# Patient Record
Sex: Female | Born: 1950 | Race: White | Hispanic: No | Marital: Married | Smoking: Former smoker
Health system: Southern US, Community
[De-identification: ages and names within clinical notes are randomized; demographics above are authoritative.]

## PROBLEM LIST (undated history)

## (undated) DIAGNOSIS — E119 Type 2 diabetes mellitus without complications: Secondary | ICD-10-CM

## (undated) DIAGNOSIS — F32A Depression, unspecified: Secondary | ICD-10-CM

## (undated) DIAGNOSIS — F329 Major depressive disorder, single episode, unspecified: Secondary | ICD-10-CM

## (undated) DIAGNOSIS — G709 Myoneural disorder, unspecified: Secondary | ICD-10-CM

## (undated) DIAGNOSIS — I10 Essential (primary) hypertension: Secondary | ICD-10-CM

## (undated) DIAGNOSIS — I341 Nonrheumatic mitral (valve) prolapse: Secondary | ICD-10-CM

## (undated) DIAGNOSIS — F419 Anxiety disorder, unspecified: Secondary | ICD-10-CM

## (undated) DIAGNOSIS — E079 Disorder of thyroid, unspecified: Secondary | ICD-10-CM

## (undated) DIAGNOSIS — T753XXA Motion sickness, initial encounter: Secondary | ICD-10-CM

## (undated) DIAGNOSIS — R519 Headache, unspecified: Secondary | ICD-10-CM

## (undated) DIAGNOSIS — R51 Headache: Secondary | ICD-10-CM

## (undated) DIAGNOSIS — I4891 Unspecified atrial fibrillation: Secondary | ICD-10-CM

## (undated) DIAGNOSIS — Z9641 Presence of insulin pump (external) (internal): Secondary | ICD-10-CM

## (undated) HISTORY — DX: Essential (primary) hypertension: I10

## (undated) HISTORY — DX: Major depressive disorder, single episode, unspecified: F32.9

## (undated) HISTORY — DX: Unspecified atrial fibrillation: I48.91

## (undated) HISTORY — DX: Type 2 diabetes mellitus without complications: E11.9

## (undated) HISTORY — DX: Disorder of thyroid, unspecified: E07.9

## (undated) HISTORY — DX: Anxiety disorder, unspecified: F41.9

## (undated) HISTORY — DX: Nonrheumatic mitral (valve) prolapse: I34.1

## (undated) HISTORY — DX: Depression, unspecified: F32.A

---

## 2002-01-21 HISTORY — PX: BACK SURGERY: SHX140

## 2002-10-05 ENCOUNTER — Encounter: Payer: Self-pay | Admitting: Neurosurgery

## 2002-10-07 ENCOUNTER — Ambulatory Visit (HOSPITAL_COMMUNITY): Admission: RE | Admit: 2002-10-07 | Discharge: 2002-10-08 | Payer: Self-pay | Admitting: Neurosurgery

## 2002-10-07 ENCOUNTER — Encounter: Payer: Self-pay | Admitting: Neurosurgery

## 2002-10-17 ENCOUNTER — Inpatient Hospital Stay (HOSPITAL_COMMUNITY): Admission: EM | Admit: 2002-10-17 | Discharge: 2002-10-20 | Payer: Self-pay | Admitting: Neurosurgery

## 2002-10-18 ENCOUNTER — Encounter: Payer: Self-pay | Admitting: Neurosurgery

## 2002-10-19 ENCOUNTER — Encounter: Payer: Self-pay | Admitting: Neurosurgery

## 2002-11-29 ENCOUNTER — Inpatient Hospital Stay (HOSPITAL_COMMUNITY): Admission: RE | Admit: 2002-11-29 | Discharge: 2002-12-02 | Payer: Self-pay | Admitting: Neurosurgery

## 2002-12-23 ENCOUNTER — Encounter: Admission: RE | Admit: 2002-12-23 | Discharge: 2002-12-23 | Payer: Self-pay | Admitting: Neurosurgery

## 2003-03-01 ENCOUNTER — Encounter: Admission: RE | Admit: 2003-03-01 | Discharge: 2003-03-01 | Payer: Self-pay | Admitting: Neurosurgery

## 2003-12-27 ENCOUNTER — Ambulatory Visit: Payer: Self-pay

## 2004-02-21 ENCOUNTER — Encounter: Admission: RE | Admit: 2004-02-21 | Discharge: 2004-02-21 | Payer: Self-pay | Admitting: Neurosurgery

## 2004-12-27 ENCOUNTER — Ambulatory Visit: Payer: Self-pay | Admitting: Unknown Physician Specialty

## 2005-05-17 ENCOUNTER — Ambulatory Visit: Payer: Self-pay | Admitting: Neurosurgery

## 2007-04-05 ENCOUNTER — Encounter
Admission: RE | Admit: 2007-04-05 | Discharge: 2007-04-05 | Payer: Self-pay | Admitting: Physical Medicine and Rehabilitation

## 2007-07-30 ENCOUNTER — Ambulatory Visit: Payer: Self-pay | Admitting: Unknown Physician Specialty

## 2007-11-20 ENCOUNTER — Emergency Department: Payer: Self-pay | Admitting: Emergency Medicine

## 2007-11-22 ENCOUNTER — Ambulatory Visit: Payer: Self-pay | Admitting: Family Medicine

## 2008-09-09 ENCOUNTER — Ambulatory Visit: Payer: Self-pay | Admitting: Unknown Physician Specialty

## 2008-11-14 ENCOUNTER — Ambulatory Visit: Payer: Self-pay | Admitting: Unknown Physician Specialty

## 2009-04-05 ENCOUNTER — Ambulatory Visit: Payer: Self-pay | Admitting: Unknown Physician Specialty

## 2009-08-11 ENCOUNTER — Ambulatory Visit: Payer: Self-pay | Admitting: Unknown Physician Specialty

## 2010-06-08 NOTE — Op Note (Signed)
NAME:  Colleen Payne, Colleen Payne                        ACCOUNT NO.:  [id number]   MEDICAL RECORD NO.:  [id number]                   PATIENT TYPE:  INP   LOCATION:  2899                                 FACILITY:  [facility]   PHYSICIAN:  Kathaleen Maser. Pool, M.D.                 DATE OF BIRTH:  10-09-1950   DATE OF PROCEDURE:  11/29/2002  DATE OF DISCHARGE:                                 OPERATIVE REPORT   PREOPERATIVE DIAGNOSES:   POSTOPERATIVE DIAGNOSES:   OPERATION PERFORMED:   SURGEON:  Henry A. Pool, M.D.   ASSISTANT:   ANESTHESIA:  General endotracheal.   INDICATIONS FOR PROCEDURE:  Colleen Payne is a 60 year old female who is status  post previous left-sided L3-4 laminotomy and microdiskectomy and subsequent  left-sided L4-5 decompressive laminotomy.  She presents with continued back  and left lower extremity pain failing all conservative management.  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  The patient  presents now for two-level decompression and fusion with instrumentation for  hopeful improvement of the symptoms.   DESCRIPTION OF PROCEDURE:  The patient was taken to the operating room and  placed on the table in the supine position.  After adequate level of  anesthesia was achieved, the patient was positioned prone onto a Wilson  frame and appropriately padded.  The patient's lumbar region was prepped and  draped sterilely.  A 10 blade was used to make a linear skin incision  overlying the L3,4, and 5 levels.  This was carried down sharply in the  midline.  A subperiosteal dissection was then performed exposing the lamina  and facet joints of L3 and L4 and L5 bilateral as well as the transverse  processes of L3, 4 and 5 bilaterally.  Deep self-retaining retractor was  placed.  Intraoperative fluoroscopy was used, the levels were confirmed.  Previous laminotomies on the left side at L3-4  and L4-5 were dissected free  using dental instruments.  Complete laminectomies were then performed using  Leksell rongeurs, Kerrison rongeurs and high speed drill to remove the  entire lamina of L3, the entire lamina of L4 and the superior aspect of the  lamina of L5.  Complete inferior facetectomies at L3 and L4 were performed  bilaterally.  Complete superior facetectomies at L4 and L5 were performed  bilaterally.  All bone was cleaned and used in later autografting.  Epidural  scar and ligamentum flavum was then elevated and resected in piecemeal  fashion using Kerrison rongeurs.  The underlying thecal sac and exiting L3,  L4 and L5 nerve roots were widely decompressed.  There was no evidence of  residual foraminal stenosis.  Starting first at L3-4, the epidural venous  plexus was coagulated and cut.  Starting first on the left side, thecal sac  and nerve roots were gradually mobilized and retracted toward  the midline.  The disk space was then isolated and incised with a 15 blade in rectangular  fashion.  A wide disk space cleanout was then achieved using pituitary  rongeurs, upward angled pituitary rongeurs and Epstein curets.  The disk  space was then distracted up to 8 mm and attention was placed to the right  side.  The thecal sac and nerve roots were then protected on the right.  The  disk space was incised and aggressive diskectomy was performed on the right  side.  Then using 8 mm tangent instruments, the disk space was then cut with  a box cutter and then cut with a tangent chisel.  Soft tissue was removed  from the interspace.  An 8 x 26 mm tangent wedge was then packed into place  and recessed approximately 2 mm from the posterior cortical margin.  Distractor was removed to the patient's left side.  The thecal sac and nerve  roots were protected on the left.  The disk space was then reamed, then cut  with an 8 mm chisel.  The disk space was further curettaged.  Morselized   autograft was then packed within the interspace. A second 8 x 26 mm tangent  wedge was then impacted into place and recessed approximately 2 mm from the  posterior cortical margin.  The procedure was then repeated at L4-5 again  without complication.  We were able to completely reduce the patient's  deformity at L4-5.  Pedicles at L3, L4 and L5 were then isolated using  surface landmarks and intraoperative fluoroscopy.  Superficial bone over the  pedicle was removed using high speed drill.  Each pedicle was then probed  using pedicle awl.  Each pedicle awl track was then probed and found to be  solidly within bone.  Each pedicle awl track was then tapped with a 5.25  screw tap and once again probed and found to be solidly within bone.  6.75 x  40 mm spiral 90 screws were placed bilaterally at L3, L4 and L5.  All six  screws had good purchase and were well directed.  Transverse processes of  L3, 4 and 5 were then decorticated using a high speed drill.  Morselized  autograft was packed posterolaterally for later use in fusion.  Titanium rod  was then contoured and placed over the screw heads at L3, 4 and 5.  A  locking cap was then placed over the screw heads.  The locking cap was then  engaged with construct under compression.  Final images revealed good  position of bone graft and hardware at the proper operative level with  normal alignment of the spine.  A transverse connector was placed.  The  wound was irrigated one final time.  Tisseel was placed over the thecal sac  as the patient had a previous CSF leak with her primary operation; however,  there was no evidence of any CSF leak with this case. The wound was then  closed in layers with Vicryl sutures.  Steri-Strips and sterile dressing  were applied.  There were no apparent complications.  The patient tolerated  the procedure well and she returned to the recovery room postoperatively.                                                Henry A. Pool, M.D.  HAP/MEDQ  D:  11/29/2002  T:  11/29/2002  Job:  [id number]

## 2010-06-08 NOTE — Op Note (Signed)
NAME:  Colleen Payne, Colleen Payne                        ACCOUNT NO.:  [id number]   MEDICAL RECORD NO.:  [id number]                   PATIENT TYPE:  INP   LOCATION:  3005                                 FACILITY:  [facility]   PHYSICIAN:  Kathaleen Maser. Pool, M.D.                 DATE OF BIRTH:  04/09/1950   DATE OF PROCEDURE:  10/19/2002  DATE OF DISCHARGE:                                 OPERATIVE REPORT   SERVICE:  Neurosurgery.   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   PROCEDURE:  Left L4-5 decompressive laminotomy with foraminotomy.   SURGEON:  Kathaleen Maser. Pool, M.D.   ASSISTANT:  Tia Alert, MD   ANESTHESIA:  General endotracheal.   INDICATIONS FOR PROCEDURE:  [name] is a 60 year old female who is  approximately 1 1/2 weeks status post left sided L3-4 laminotomy and  microdiskectomy for treatment of a symptomatic lumbar disk herniation. The  patient had known advanced degenerative disk disease of both L3-4 and L4-5.  At L4-5, she had evidence of a chronic grade 1 degenerative  spondylolisthesis with stenosis. In fact, the original recommendation to the  patient was that she undergo an L3-4 and L4-5 decompression and fusion with  instrumentation for a more complete solution to her problem. The patient had  an isolated left sided L4 radiculopathy preoperatively and she very much  wished to avoid the fusion. For that reason, we decided to proceed with a  left sided L3-4 laminotomy and microdiskectomy. Postoperatively, the patient  did well in that her left lower extremity radicular pain resolved. However,  two days postoperatively she began having pronounced left sided L5 radicular  pain, paresthesias and weakness. She has a significant foot drop with  dorsiflexion strength reduced to 3/5. She has failed conservative management  with antiinflammatory medications including oral and injected  steroids. We  had a long discussion as to what to do. We reimaged her spine and the  surgical site at L3-4 looks quite good. There is no evidence of residual  stenosis or nerve root compression. At L4-5, she had some significant left  lateral recess stenosis with compression to the left sided L5 nerve root. We  discussed possible options for management. Given the degree of weakness that  she has, we decided to proceed with a left sided L4-5 laminotomy and  foraminotomy for hopeful improvement in her symptoms.   DESCRIPTION OF PROCEDURE:  The patient was taken to the operating room,  placed on the table in a supine position. After an adequate level of  anesthesia was achieved, the patient was positioned prone onto a Wilson  frame, appropriately padded. The patient's lumbar region, prepped and draped  sterilely. A 10 blade was used to make a linear skin incision overlying the  L4-5 interspace. This was carried down sharply in the midline. Subperiosteal  dissection  was performed exposing the L4-5 lamina and facet joint complex. X-  rays taken, level was confirmed.  A laminotomy was then performed using a  high speed drill and Kerrison rongeurs to remove the inferior aspect of the  lamina of L4, the medial aspect of the L4-5 facet joint and superior rim of  the L5 lamina. The ligamentum flavum was then elevated and resected in a  piecemeal fashion using Kerrison rongeurs and carried down to the thecal sac  and the exiting L5 nerve root was identified. The microscope was brought  onto the field and used for microdissection of the left sided L5 nerve root.  Wide foraminotomies were performed along the course of the exiting L5 nerve  root. At this point, there was no evidence of residual compression. There  was no evidence of injury to the thecal sac or nerve roots. The wound was  then copiously irrigated with antibiotic solution. Gelfoam was placed  topically. Hemostasis was found to be good.  The microscope and retraction  system were removed. Hemostasis was achieved with electrocautery. The wound  was closed in layers with Vicryl sutures. Steri-Strips and a sterile  dressing were applied. There were no apparent complications. The patient  tolerated the procedure well and she returned to the recovery room postop.                                               Henry A. Pool, M.D.    HAP/MEDQ  D:  10/19/2002  T:  10/20/2002  Job:  [id number]

## 2010-06-08 NOTE — Op Note (Signed)
NAME:  Colleen Payne, Colleen Payne                        ACCOUNT NO.:  [id number]   MEDICAL RECORD NO.:  [id number]                   PATIENT TYPE:  OIB   LOCATION:  3014                                 FACILITY:  [facility]   PHYSICIAN:  Kathaleen Maser. Pool, M.D.                 DATE OF BIRTH:  [birth year]   DATE OF PROCEDURE:  10/07/2002  DATE OF DISCHARGE:                                 OPERATIVE REPORT   PREOPERATIVE DIAGNOSES:  Left L3-4 herniated nucleus pulposus with  radiculopathy.   POSTOPERATIVE DIAGNOSES:  Left L3-4 herniated nucleus pulposus with  radiculopathy.   OPERATION PERFORMED:  Left L3-4 laminotomy and microdiskectomy.   SURGEON:  Kathaleen Maser. Pool, M.D.   ASSISTANT:  Tia Alert, MD   ANESTHESIA:  General endotracheal.   INDICATIONS FOR PROCEDURE:  [name] is a 60 year old female with a  history of severe back and left lower extremity pain, paresthesias and  weakness consistent with a left-sided L4 radiculopathy which has failed  conservative management.  MRI scan demonstrates a large leftward L3-4 disk  herniation with marked compression of the thecal sac and left L4 nerve root.  The patient has been counseled as to her options.  She has decided to  proceed with a left-sided L3-4 laminotomy and microdiskectomy for hopeful  improvement of the symptoms.   DESCRIPTION OF PROCEDURE:  The patient was taken to the operating room and  placed on the table in the supine position.  After adequate level of  anesthesia was achieved, the patient was positioned prone onto a Wilson  frame and appropriately padded.  The patient's lumbar region was prepped and  draped sterilely.  A 10 blade was used to make a linear skin incision  overlying the L3-4 interspace.  This was carried down sharply in the  midline.  A subperiosteal dissection was then performed exposing the lamina  and facet joints of L3 and L4 on the left.  Deep self-retaining retractor  was placed.  Intraoperative x-ray was  taken, the level was confirmed.  The  laminotomy was then performed using high speed drill and Kerrison rongeurs  to remove the inferior edge of the lamina at L3, the medial aspect of the L3-  4 facet joint and the superior rim of the L4 lamina.  The ligamentum flavum  was then elevated and resected in a piecemeal fashion using Kerrison  rongeurs.  The underlying thecal sac was identified.  The thecal sac and L4  nerve root were gently mobilized.   The microscope was brought into the field and used for microdissection of  the left-sided L4 nerve root and disk herniation.  The epidural venous  plexus was coagulated and cut.  A large amount of free disk herniation was  encountered and completely resected using a blunt nerve hook and pituitary  rongeurs.  The disk space was isolated and incised with a 15 blade in  rectangular fashion.  A wide disk space cleanout was then achieved pituitary  rongeurs and upward angled pituitary rongeurs and Epstein curets.  All loose  or obviously degenerative disk material was removed from the interspace.  The disk herniation was fused with the ventral surface of the dura.  This  was dissected free and the disk herniation was completely resected; however,  a small unintended durotomy was made ventrally.  This was unable to be  sutured closed.  The wound was then irrigated with antibiotic solution and  then Duragen collagen sponge was then placed between the disk and the  durotomy.  This formed good closure.  The lateral gutter was also packed  with  Duragen.  Tisseal fibrin glue sealant was then placed over the  laminotomy defect as was Gelfoam.  There was no evidence of CSF leak at this  point.  The wound was then irrigated one final time and then closed in  typical fashion.  Steri-Strips and sterile dressing were applied.  There  were no apparent complications.  The patient tolerated the procedure well  and returned to the recovery room  postoperatively.                                                Henry A. Pool, M.D.    HAP/MEDQ  D:  10/07/2002  T:  10/07/2002  Job:  [id number]

## 2010-06-08 NOTE — Discharge Summary (Signed)
NAME:  Colleen Payne, Colleen Payne                        ACCOUNT NO.:  [id number]   MEDICAL RECORD NO.:  [id number]                   PATIENT TYPE:  INP   LOCATION:  3006                                 FACILITY:  [facility]   PHYSICIAN:  Kathaleen Maser. Pool, M.D.                 DATE OF BIRTH:  [birth year]   DATE OF ADMISSION:  11/29/2002  DATE OF DISCHARGE:  12/02/2002                                 DISCHARGE SUMMARY   FINAL DIAGNOSES:  1. L3-4 and L4-5 degenerative disk disease.  2. L4-5 grade 1 degenerative spondylolisthesis.   OPERATIONS/TREATMENTS:  L3-4 and L4-5 posterior lumbar decompression and  fusion with instrumentation.   HISTORY OF PRESENT ILLNESS:  Ms. 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  Workup demonstrated evidence of multi-degenerative  disk disease with stenosis at L3-4 and a grade 1 degenerative  spondylolisthesis at L4-5 with stenosis.  The patient has failed all  conservative management.  She has failed simple decompressive surgery in the  past and presents now for decompression and fusion surgery.   HOSPITAL COURSE:  The patient was taken to the operating room where an  uncomplicated L3-4 and L4-5 decompressive laminectomy and posterior lumbar  interbody fusion was performed at L3-4 and L4-5 coupled with posterior rod  and fusion from L3-5 using a segmental pedicle screw fixation.  Postoperatively, the patient did well with good improvement of her left  lower extremity pain.  Neurologically, she is intact.  The wound is healing  well.  At the time of discharge, the patient was ambulatory without  assistance.  The wound is healing well.  The patient's pain was much  improved and easily controlled with oral medications.   CONDITION ON DISCHARGE:  Improved.   FOLLOW UP:  Discharge followup is in one week in my office.      Henry A. Pool, M.D.    HAP/MEDQ  D:  01/20/2003  T:  01/20/2003  Job:  [id number]

## 2010-07-31 ENCOUNTER — Emergency Department: Payer: Self-pay | Admitting: Internal Medicine

## 2010-11-03 ENCOUNTER — Emergency Department: Payer: Self-pay | Admitting: Emergency Medicine

## 2011-01-31 DIAGNOSIS — E039 Hypothyroidism, unspecified: Secondary | ICD-10-CM | POA: Diagnosis not present

## 2011-01-31 DIAGNOSIS — E119 Type 2 diabetes mellitus without complications: Secondary | ICD-10-CM | POA: Diagnosis not present

## 2011-02-05 DIAGNOSIS — H109 Unspecified conjunctivitis: Secondary | ICD-10-CM | POA: Diagnosis not present

## 2011-02-05 DIAGNOSIS — E119 Type 2 diabetes mellitus without complications: Secondary | ICD-10-CM | POA: Diagnosis not present

## 2011-02-05 DIAGNOSIS — I4891 Unspecified atrial fibrillation: Secondary | ICD-10-CM | POA: Diagnosis not present

## 2011-02-06 DIAGNOSIS — IMO0002 Reserved for concepts with insufficient information to code with codable children: Secondary | ICD-10-CM | POA: Diagnosis not present

## 2011-02-06 DIAGNOSIS — M48061 Spinal stenosis, lumbar region without neurogenic claudication: Secondary | ICD-10-CM | POA: Diagnosis not present

## 2011-02-08 DIAGNOSIS — N951 Menopausal and female climacteric states: Secondary | ICD-10-CM | POA: Diagnosis not present

## 2011-03-05 DIAGNOSIS — I4891 Unspecified atrial fibrillation: Secondary | ICD-10-CM | POA: Diagnosis not present

## 2011-03-07 DIAGNOSIS — I80299 Phlebitis and thrombophlebitis of other deep vessels of unspecified lower extremity: Secondary | ICD-10-CM | POA: Diagnosis not present

## 2011-03-13 DIAGNOSIS — I471 Supraventricular tachycardia: Secondary | ICD-10-CM | POA: Diagnosis not present

## 2011-03-21 DIAGNOSIS — I4891 Unspecified atrial fibrillation: Secondary | ICD-10-CM | POA: Diagnosis not present

## 2011-03-26 DIAGNOSIS — Z981 Arthrodesis status: Secondary | ICD-10-CM | POA: Diagnosis not present

## 2011-03-26 DIAGNOSIS — M47817 Spondylosis without myelopathy or radiculopathy, lumbosacral region: Secondary | ICD-10-CM | POA: Diagnosis not present

## 2011-03-26 DIAGNOSIS — M5126 Other intervertebral disc displacement, lumbar region: Secondary | ICD-10-CM | POA: Diagnosis not present

## 2011-03-28 DIAGNOSIS — I4891 Unspecified atrial fibrillation: Secondary | ICD-10-CM | POA: Diagnosis not present

## 2011-04-02 DIAGNOSIS — I4891 Unspecified atrial fibrillation: Secondary | ICD-10-CM | POA: Diagnosis not present

## 2011-04-09 DIAGNOSIS — I4891 Unspecified atrial fibrillation: Secondary | ICD-10-CM | POA: Diagnosis not present

## 2011-04-10 DIAGNOSIS — M48061 Spinal stenosis, lumbar region without neurogenic claudication: Secondary | ICD-10-CM | POA: Diagnosis not present

## 2011-04-17 DIAGNOSIS — I4891 Unspecified atrial fibrillation: Secondary | ICD-10-CM | POA: Diagnosis not present

## 2011-05-01 DIAGNOSIS — M5410 Radiculopathy, site unspecified: Secondary | ICD-10-CM | POA: Insufficient documentation

## 2011-05-01 DIAGNOSIS — IMO0002 Reserved for concepts with insufficient information to code with codable children: Secondary | ICD-10-CM | POA: Diagnosis not present

## 2011-05-02 DIAGNOSIS — I4891 Unspecified atrial fibrillation: Secondary | ICD-10-CM | POA: Diagnosis not present

## 2011-05-02 DIAGNOSIS — E109 Type 1 diabetes mellitus without complications: Secondary | ICD-10-CM | POA: Diagnosis not present

## 2011-05-06 DIAGNOSIS — E119 Type 2 diabetes mellitus without complications: Secondary | ICD-10-CM | POA: Diagnosis not present

## 2011-05-06 DIAGNOSIS — M549 Dorsalgia, unspecified: Secondary | ICD-10-CM | POA: Diagnosis not present

## 2011-05-08 DIAGNOSIS — IMO0002 Reserved for concepts with insufficient information to code with codable children: Secondary | ICD-10-CM | POA: Diagnosis not present

## 2011-05-20 DIAGNOSIS — I4891 Unspecified atrial fibrillation: Secondary | ICD-10-CM | POA: Diagnosis not present

## 2011-05-28 DIAGNOSIS — E119 Type 2 diabetes mellitus without complications: Secondary | ICD-10-CM | POA: Diagnosis not present

## 2011-05-28 DIAGNOSIS — I4891 Unspecified atrial fibrillation: Secondary | ICD-10-CM | POA: Diagnosis not present

## 2011-05-28 DIAGNOSIS — I10 Essential (primary) hypertension: Secondary | ICD-10-CM | POA: Diagnosis not present

## 2011-06-12 DIAGNOSIS — E1165 Type 2 diabetes mellitus with hyperglycemia: Secondary | ICD-10-CM | POA: Diagnosis not present

## 2011-06-12 DIAGNOSIS — IMO0002 Reserved for concepts with insufficient information to code with codable children: Secondary | ICD-10-CM | POA: Diagnosis not present

## 2011-06-12 DIAGNOSIS — H43399 Other vitreous opacities, unspecified eye: Secondary | ICD-10-CM | POA: Diagnosis not present

## 2011-06-12 DIAGNOSIS — E1139 Type 2 diabetes mellitus with other diabetic ophthalmic complication: Secondary | ICD-10-CM | POA: Diagnosis not present

## 2011-06-27 DIAGNOSIS — I4891 Unspecified atrial fibrillation: Secondary | ICD-10-CM | POA: Diagnosis not present

## 2011-07-02 DIAGNOSIS — M545 Low back pain, unspecified: Secondary | ICD-10-CM | POA: Diagnosis not present

## 2011-07-02 DIAGNOSIS — G4701 Insomnia due to medical condition: Secondary | ICD-10-CM | POA: Diagnosis not present

## 2011-07-02 DIAGNOSIS — T40605A Adverse effect of unspecified narcotics, initial encounter: Secondary | ICD-10-CM | POA: Diagnosis not present

## 2011-07-08 DIAGNOSIS — E109 Type 1 diabetes mellitus without complications: Secondary | ICD-10-CM | POA: Diagnosis not present

## 2011-07-30 DIAGNOSIS — I4891 Unspecified atrial fibrillation: Secondary | ICD-10-CM | POA: Diagnosis not present

## 2011-08-07 DIAGNOSIS — G4733 Obstructive sleep apnea (adult) (pediatric): Secondary | ICD-10-CM | POA: Diagnosis not present

## 2011-08-07 DIAGNOSIS — G4731 Primary central sleep apnea: Secondary | ICD-10-CM | POA: Diagnosis not present

## 2011-08-12 DIAGNOSIS — IMO0002 Reserved for concepts with insufficient information to code with codable children: Secondary | ICD-10-CM | POA: Diagnosis not present

## 2011-08-13 DIAGNOSIS — I4891 Unspecified atrial fibrillation: Secondary | ICD-10-CM | POA: Diagnosis not present

## 2011-08-29 DIAGNOSIS — R197 Diarrhea, unspecified: Secondary | ICD-10-CM | POA: Diagnosis not present

## 2011-09-10 DIAGNOSIS — IMO0002 Reserved for concepts with insufficient information to code with codable children: Secondary | ICD-10-CM | POA: Diagnosis not present

## 2011-09-11 ENCOUNTER — Emergency Department: Payer: Self-pay | Admitting: Internal Medicine

## 2011-09-11 DIAGNOSIS — E039 Hypothyroidism, unspecified: Secondary | ICD-10-CM | POA: Diagnosis not present

## 2011-09-11 DIAGNOSIS — Z79899 Other long term (current) drug therapy: Secondary | ICD-10-CM | POA: Diagnosis not present

## 2011-09-11 DIAGNOSIS — IMO0001 Reserved for inherently not codable concepts without codable children: Secondary | ICD-10-CM | POA: Diagnosis not present

## 2011-09-11 DIAGNOSIS — I10 Essential (primary) hypertension: Secondary | ICD-10-CM | POA: Diagnosis not present

## 2011-09-11 DIAGNOSIS — E109 Type 1 diabetes mellitus without complications: Secondary | ICD-10-CM | POA: Diagnosis not present

## 2011-09-11 DIAGNOSIS — R197 Diarrhea, unspecified: Secondary | ICD-10-CM | POA: Diagnosis not present

## 2011-09-11 DIAGNOSIS — I4891 Unspecified atrial fibrillation: Secondary | ICD-10-CM | POA: Diagnosis not present

## 2011-09-13 DIAGNOSIS — I4891 Unspecified atrial fibrillation: Secondary | ICD-10-CM | POA: Diagnosis not present

## 2011-09-19 DIAGNOSIS — I4891 Unspecified atrial fibrillation: Secondary | ICD-10-CM | POA: Diagnosis not present

## 2011-09-30 DIAGNOSIS — I4891 Unspecified atrial fibrillation: Secondary | ICD-10-CM | POA: Diagnosis not present

## 2011-10-01 DIAGNOSIS — Z981 Arthrodesis status: Secondary | ICD-10-CM | POA: Diagnosis not present

## 2011-10-01 DIAGNOSIS — M546 Pain in thoracic spine: Secondary | ICD-10-CM | POA: Diagnosis not present

## 2011-10-01 DIAGNOSIS — M5030 Other cervical disc degeneration, unspecified cervical region: Secondary | ICD-10-CM | POA: Diagnosis not present

## 2011-10-01 DIAGNOSIS — M5126 Other intervertebral disc displacement, lumbar region: Secondary | ICD-10-CM | POA: Diagnosis not present

## 2011-10-07 DIAGNOSIS — Z1231 Encounter for screening mammogram for malignant neoplasm of breast: Secondary | ICD-10-CM | POA: Diagnosis not present

## 2011-10-08 DIAGNOSIS — E109 Type 1 diabetes mellitus without complications: Secondary | ICD-10-CM | POA: Diagnosis not present

## 2011-10-08 DIAGNOSIS — I4891 Unspecified atrial fibrillation: Secondary | ICD-10-CM | POA: Diagnosis not present

## 2011-10-10 DIAGNOSIS — E109 Type 1 diabetes mellitus without complications: Secondary | ICD-10-CM | POA: Diagnosis not present

## 2011-10-10 DIAGNOSIS — Z23 Encounter for immunization: Secondary | ICD-10-CM | POA: Diagnosis not present

## 2011-10-15 DIAGNOSIS — M545 Low back pain, unspecified: Secondary | ICD-10-CM | POA: Diagnosis not present

## 2011-10-15 DIAGNOSIS — M4802 Spinal stenosis, cervical region: Secondary | ICD-10-CM | POA: Diagnosis not present

## 2011-10-15 DIAGNOSIS — Z981 Arthrodesis status: Secondary | ICD-10-CM | POA: Diagnosis not present

## 2011-10-15 DIAGNOSIS — M549 Dorsalgia, unspecified: Secondary | ICD-10-CM | POA: Diagnosis not present

## 2011-10-15 DIAGNOSIS — IMO0002 Reserved for concepts with insufficient information to code with codable children: Secondary | ICD-10-CM | POA: Diagnosis not present

## 2011-10-17 DIAGNOSIS — I4891 Unspecified atrial fibrillation: Secondary | ICD-10-CM | POA: Diagnosis not present

## 2011-10-22 DIAGNOSIS — M545 Low back pain, unspecified: Secondary | ICD-10-CM | POA: Diagnosis not present

## 2011-10-24 DIAGNOSIS — I4891 Unspecified atrial fibrillation: Secondary | ICD-10-CM | POA: Diagnosis not present

## 2011-10-31 DIAGNOSIS — E1065 Type 1 diabetes mellitus with hyperglycemia: Secondary | ICD-10-CM | POA: Diagnosis not present

## 2011-10-31 DIAGNOSIS — R350 Frequency of micturition: Secondary | ICD-10-CM | POA: Diagnosis not present

## 2011-10-31 DIAGNOSIS — E1049 Type 1 diabetes mellitus with other diabetic neurological complication: Secondary | ICD-10-CM | POA: Diagnosis not present

## 2011-10-31 DIAGNOSIS — I4891 Unspecified atrial fibrillation: Secondary | ICD-10-CM | POA: Diagnosis not present

## 2011-11-07 DIAGNOSIS — I4891 Unspecified atrial fibrillation: Secondary | ICD-10-CM | POA: Diagnosis not present

## 2011-11-07 DIAGNOSIS — K869 Disease of pancreas, unspecified: Secondary | ICD-10-CM | POA: Diagnosis not present

## 2011-11-07 DIAGNOSIS — I10 Essential (primary) hypertension: Secondary | ICD-10-CM | POA: Diagnosis not present

## 2011-11-11 ENCOUNTER — Ambulatory Visit: Payer: Self-pay | Admitting: Unknown Physician Specialty

## 2011-11-11 DIAGNOSIS — K838 Other specified diseases of biliary tract: Secondary | ICD-10-CM | POA: Diagnosis not present

## 2011-11-11 DIAGNOSIS — R933 Abnormal findings on diagnostic imaging of other parts of digestive tract: Secondary | ICD-10-CM | POA: Diagnosis not present

## 2011-11-14 DIAGNOSIS — Z7901 Long term (current) use of anticoagulants: Secondary | ICD-10-CM | POA: Diagnosis not present

## 2011-11-14 DIAGNOSIS — M4800 Spinal stenosis, site unspecified: Secondary | ICD-10-CM | POA: Diagnosis not present

## 2011-11-14 DIAGNOSIS — Z79899 Other long term (current) drug therapy: Secondary | ICD-10-CM | POA: Diagnosis not present

## 2011-11-14 DIAGNOSIS — I4891 Unspecified atrial fibrillation: Secondary | ICD-10-CM | POA: Diagnosis not present

## 2011-11-14 DIAGNOSIS — M5137 Other intervertebral disc degeneration, lumbosacral region: Secondary | ICD-10-CM | POA: Diagnosis not present

## 2011-11-14 DIAGNOSIS — Z794 Long term (current) use of insulin: Secondary | ICD-10-CM | POA: Diagnosis not present

## 2011-11-14 DIAGNOSIS — G609 Hereditary and idiopathic neuropathy, unspecified: Secondary | ICD-10-CM | POA: Diagnosis not present

## 2011-11-14 DIAGNOSIS — G894 Chronic pain syndrome: Secondary | ICD-10-CM | POA: Diagnosis not present

## 2011-11-14 DIAGNOSIS — M961 Postlaminectomy syndrome, not elsewhere classified: Secondary | ICD-10-CM | POA: Diagnosis not present

## 2011-11-14 DIAGNOSIS — Z5181 Encounter for therapeutic drug level monitoring: Secondary | ICD-10-CM | POA: Diagnosis not present

## 2011-11-14 DIAGNOSIS — I10 Essential (primary) hypertension: Secondary | ICD-10-CM | POA: Diagnosis not present

## 2011-11-14 DIAGNOSIS — E109 Type 1 diabetes mellitus without complications: Secondary | ICD-10-CM | POA: Diagnosis not present

## 2011-11-15 DIAGNOSIS — K869 Disease of pancreas, unspecified: Secondary | ICD-10-CM | POA: Diagnosis not present

## 2011-11-15 DIAGNOSIS — K559 Vascular disorder of intestine, unspecified: Secondary | ICD-10-CM | POA: Diagnosis not present

## 2011-11-22 DIAGNOSIS — I4891 Unspecified atrial fibrillation: Secondary | ICD-10-CM | POA: Diagnosis not present

## 2011-11-22 DIAGNOSIS — M25559 Pain in unspecified hip: Secondary | ICD-10-CM | POA: Diagnosis not present

## 2011-12-03 DIAGNOSIS — M961 Postlaminectomy syndrome, not elsewhere classified: Secondary | ICD-10-CM | POA: Diagnosis not present

## 2011-12-03 DIAGNOSIS — G8928 Other chronic postprocedural pain: Secondary | ICD-10-CM | POA: Diagnosis not present

## 2011-12-03 DIAGNOSIS — Z79899 Other long term (current) drug therapy: Secondary | ICD-10-CM | POA: Diagnosis not present

## 2011-12-05 DIAGNOSIS — I4891 Unspecified atrial fibrillation: Secondary | ICD-10-CM | POA: Diagnosis not present

## 2011-12-11 DIAGNOSIS — I4891 Unspecified atrial fibrillation: Secondary | ICD-10-CM | POA: Diagnosis not present

## 2011-12-11 DIAGNOSIS — E119 Type 2 diabetes mellitus without complications: Secondary | ICD-10-CM | POA: Diagnosis not present

## 2011-12-11 DIAGNOSIS — I10 Essential (primary) hypertension: Secondary | ICD-10-CM | POA: Diagnosis not present

## 2011-12-11 DIAGNOSIS — E782 Mixed hyperlipidemia: Secondary | ICD-10-CM | POA: Diagnosis not present

## 2011-12-23 DIAGNOSIS — I4891 Unspecified atrial fibrillation: Secondary | ICD-10-CM | POA: Diagnosis not present

## 2012-01-03 DIAGNOSIS — Z79899 Other long term (current) drug therapy: Secondary | ICD-10-CM | POA: Diagnosis not present

## 2012-01-03 DIAGNOSIS — M961 Postlaminectomy syndrome, not elsewhere classified: Secondary | ICD-10-CM | POA: Diagnosis not present

## 2012-01-03 DIAGNOSIS — G8928 Other chronic postprocedural pain: Secondary | ICD-10-CM | POA: Diagnosis not present

## 2012-01-08 DIAGNOSIS — IMO0002 Reserved for concepts with insufficient information to code with codable children: Secondary | ICD-10-CM | POA: Diagnosis not present

## 2012-01-08 DIAGNOSIS — E1065 Type 1 diabetes mellitus with hyperglycemia: Secondary | ICD-10-CM | POA: Diagnosis not present

## 2012-01-08 DIAGNOSIS — R5383 Other fatigue: Secondary | ICD-10-CM | POA: Diagnosis not present

## 2012-01-08 DIAGNOSIS — R5381 Other malaise: Secondary | ICD-10-CM | POA: Diagnosis not present

## 2012-01-08 DIAGNOSIS — E119 Type 2 diabetes mellitus without complications: Secondary | ICD-10-CM | POA: Diagnosis not present

## 2012-01-08 DIAGNOSIS — E7800 Pure hypercholesterolemia, unspecified: Secondary | ICD-10-CM | POA: Diagnosis not present

## 2012-01-08 DIAGNOSIS — Z124 Encounter for screening for malignant neoplasm of cervix: Secondary | ICD-10-CM | POA: Diagnosis not present

## 2012-01-08 DIAGNOSIS — I10 Essential (primary) hypertension: Secondary | ICD-10-CM | POA: Diagnosis not present

## 2012-01-17 DIAGNOSIS — I059 Rheumatic mitral valve disease, unspecified: Secondary | ICD-10-CM | POA: Diagnosis not present

## 2012-01-17 DIAGNOSIS — I4891 Unspecified atrial fibrillation: Secondary | ICD-10-CM | POA: Diagnosis not present

## 2012-01-20 DIAGNOSIS — I4891 Unspecified atrial fibrillation: Secondary | ICD-10-CM | POA: Diagnosis not present

## 2012-01-27 DIAGNOSIS — I4891 Unspecified atrial fibrillation: Secondary | ICD-10-CM | POA: Diagnosis not present

## 2012-02-03 DIAGNOSIS — I4891 Unspecified atrial fibrillation: Secondary | ICD-10-CM | POA: Diagnosis not present

## 2012-02-05 DIAGNOSIS — E1065 Type 1 diabetes mellitus with hyperglycemia: Secondary | ICD-10-CM | POA: Diagnosis not present

## 2012-02-05 DIAGNOSIS — IMO0002 Reserved for concepts with insufficient information to code with codable children: Secondary | ICD-10-CM | POA: Diagnosis not present

## 2012-02-10 DIAGNOSIS — I4891 Unspecified atrial fibrillation: Secondary | ICD-10-CM | POA: Diagnosis not present

## 2012-02-18 DIAGNOSIS — I4891 Unspecified atrial fibrillation: Secondary | ICD-10-CM | POA: Diagnosis not present

## 2012-02-20 DIAGNOSIS — E1065 Type 1 diabetes mellitus with hyperglycemia: Secondary | ICD-10-CM | POA: Diagnosis not present

## 2012-02-20 DIAGNOSIS — IMO0002 Reserved for concepts with insufficient information to code with codable children: Secondary | ICD-10-CM | POA: Diagnosis not present

## 2012-02-24 DIAGNOSIS — L821 Other seborrheic keratosis: Secondary | ICD-10-CM | POA: Diagnosis not present

## 2012-02-24 DIAGNOSIS — D485 Neoplasm of uncertain behavior of skin: Secondary | ICD-10-CM | POA: Diagnosis not present

## 2012-02-24 DIAGNOSIS — D234 Other benign neoplasm of skin of scalp and neck: Secondary | ICD-10-CM | POA: Diagnosis not present

## 2012-02-27 DIAGNOSIS — Z79890 Hormone replacement therapy (postmenopausal): Secondary | ICD-10-CM | POA: Diagnosis not present

## 2012-02-27 DIAGNOSIS — N951 Menopausal and female climacteric states: Secondary | ICD-10-CM | POA: Diagnosis not present

## 2012-02-28 DIAGNOSIS — I4891 Unspecified atrial fibrillation: Secondary | ICD-10-CM | POA: Diagnosis not present

## 2012-03-11 DIAGNOSIS — I4891 Unspecified atrial fibrillation: Secondary | ICD-10-CM | POA: Diagnosis not present

## 2012-03-25 DIAGNOSIS — I4891 Unspecified atrial fibrillation: Secondary | ICD-10-CM | POA: Diagnosis not present

## 2012-04-03 DIAGNOSIS — IMO0002 Reserved for concepts with insufficient information to code with codable children: Secondary | ICD-10-CM | POA: Diagnosis not present

## 2012-04-03 DIAGNOSIS — E1065 Type 1 diabetes mellitus with hyperglycemia: Secondary | ICD-10-CM | POA: Diagnosis not present

## 2012-04-09 DIAGNOSIS — M2550 Pain in unspecified joint: Secondary | ICD-10-CM | POA: Diagnosis not present

## 2012-04-09 DIAGNOSIS — E1065 Type 1 diabetes mellitus with hyperglycemia: Secondary | ICD-10-CM | POA: Diagnosis not present

## 2012-04-09 DIAGNOSIS — IMO0002 Reserved for concepts with insufficient information to code with codable children: Secondary | ICD-10-CM | POA: Diagnosis not present

## 2012-04-17 ENCOUNTER — Ambulatory Visit: Payer: Self-pay | Admitting: Unknown Physician Specialty

## 2012-04-22 DIAGNOSIS — I4891 Unspecified atrial fibrillation: Secondary | ICD-10-CM | POA: Diagnosis not present

## 2012-04-27 DIAGNOSIS — M2550 Pain in unspecified joint: Secondary | ICD-10-CM | POA: Diagnosis not present

## 2012-04-29 DIAGNOSIS — IMO0002 Reserved for concepts with insufficient information to code with codable children: Secondary | ICD-10-CM | POA: Diagnosis not present

## 2012-04-29 DIAGNOSIS — M549 Dorsalgia, unspecified: Secondary | ICD-10-CM | POA: Diagnosis not present

## 2012-04-30 DIAGNOSIS — H2510 Age-related nuclear cataract, unspecified eye: Secondary | ICD-10-CM | POA: Diagnosis not present

## 2012-05-04 DIAGNOSIS — I4891 Unspecified atrial fibrillation: Secondary | ICD-10-CM | POA: Diagnosis not present

## 2012-05-07 DIAGNOSIS — G4730 Sleep apnea, unspecified: Secondary | ICD-10-CM | POA: Diagnosis not present

## 2012-05-07 DIAGNOSIS — IMO0002 Reserved for concepts with insufficient information to code with codable children: Secondary | ICD-10-CM | POA: Diagnosis not present

## 2012-05-07 DIAGNOSIS — M5137 Other intervertebral disc degeneration, lumbosacral region: Secondary | ICD-10-CM | POA: Diagnosis not present

## 2012-05-07 DIAGNOSIS — M961 Postlaminectomy syndrome, not elsewhere classified: Secondary | ICD-10-CM | POA: Diagnosis not present

## 2012-05-07 DIAGNOSIS — Z79899 Other long term (current) drug therapy: Secondary | ICD-10-CM | POA: Diagnosis not present

## 2012-05-18 DIAGNOSIS — I4891 Unspecified atrial fibrillation: Secondary | ICD-10-CM | POA: Diagnosis not present

## 2012-05-19 DIAGNOSIS — L570 Actinic keratosis: Secondary | ICD-10-CM | POA: Diagnosis not present

## 2012-05-19 DIAGNOSIS — L820 Inflamed seborrheic keratosis: Secondary | ICD-10-CM | POA: Diagnosis not present

## 2012-05-19 DIAGNOSIS — D485 Neoplasm of uncertain behavior of skin: Secondary | ICD-10-CM | POA: Diagnosis not present

## 2012-05-19 DIAGNOSIS — Z1283 Encounter for screening for malignant neoplasm of skin: Secondary | ICD-10-CM | POA: Diagnosis not present

## 2012-06-01 DIAGNOSIS — Z7901 Long term (current) use of anticoagulants: Secondary | ICD-10-CM | POA: Diagnosis not present

## 2012-06-01 DIAGNOSIS — H2510 Age-related nuclear cataract, unspecified eye: Secondary | ICD-10-CM | POA: Diagnosis not present

## 2012-06-03 DIAGNOSIS — I10 Essential (primary) hypertension: Secondary | ICD-10-CM | POA: Diagnosis not present

## 2012-06-03 DIAGNOSIS — I4891 Unspecified atrial fibrillation: Secondary | ICD-10-CM | POA: Diagnosis not present

## 2012-06-03 DIAGNOSIS — E782 Mixed hyperlipidemia: Secondary | ICD-10-CM | POA: Diagnosis not present

## 2012-06-10 ENCOUNTER — Ambulatory Visit: Payer: Self-pay | Admitting: Ophthalmology

## 2012-06-10 DIAGNOSIS — I4891 Unspecified atrial fibrillation: Secondary | ICD-10-CM | POA: Diagnosis not present

## 2012-06-10 DIAGNOSIS — Z7901 Long term (current) use of anticoagulants: Secondary | ICD-10-CM | POA: Diagnosis not present

## 2012-06-10 DIAGNOSIS — F329 Major depressive disorder, single episode, unspecified: Secondary | ICD-10-CM | POA: Diagnosis not present

## 2012-06-10 DIAGNOSIS — E119 Type 2 diabetes mellitus without complications: Secondary | ICD-10-CM | POA: Diagnosis not present

## 2012-06-10 DIAGNOSIS — Z794 Long term (current) use of insulin: Secondary | ICD-10-CM | POA: Diagnosis not present

## 2012-06-10 DIAGNOSIS — K5730 Diverticulosis of large intestine without perforation or abscess without bleeding: Secondary | ICD-10-CM | POA: Diagnosis not present

## 2012-06-10 DIAGNOSIS — I059 Rheumatic mitral valve disease, unspecified: Secondary | ICD-10-CM | POA: Diagnosis not present

## 2012-06-10 DIAGNOSIS — G589 Mononeuropathy, unspecified: Secondary | ICD-10-CM | POA: Diagnosis not present

## 2012-06-10 DIAGNOSIS — Z79899 Other long term (current) drug therapy: Secondary | ICD-10-CM | POA: Diagnosis not present

## 2012-06-10 DIAGNOSIS — I10 Essential (primary) hypertension: Secondary | ICD-10-CM | POA: Diagnosis not present

## 2012-06-10 DIAGNOSIS — F3289 Other specified depressive episodes: Secondary | ICD-10-CM | POA: Diagnosis not present

## 2012-06-10 DIAGNOSIS — H259 Unspecified age-related cataract: Secondary | ICD-10-CM | POA: Diagnosis not present

## 2012-06-10 DIAGNOSIS — Z888 Allergy status to other drugs, medicaments and biological substances status: Secondary | ICD-10-CM | POA: Diagnosis not present

## 2012-06-10 DIAGNOSIS — H2510 Age-related nuclear cataract, unspecified eye: Secondary | ICD-10-CM | POA: Diagnosis not present

## 2012-06-12 DIAGNOSIS — G4730 Sleep apnea, unspecified: Secondary | ICD-10-CM | POA: Diagnosis not present

## 2012-06-12 DIAGNOSIS — E119 Type 2 diabetes mellitus without complications: Secondary | ICD-10-CM | POA: Diagnosis not present

## 2012-06-12 DIAGNOSIS — I10 Essential (primary) hypertension: Secondary | ICD-10-CM | POA: Diagnosis not present

## 2012-06-12 DIAGNOSIS — G8929 Other chronic pain: Secondary | ICD-10-CM | POA: Diagnosis not present

## 2012-06-12 DIAGNOSIS — I4891 Unspecified atrial fibrillation: Secondary | ICD-10-CM | POA: Diagnosis not present

## 2012-06-17 DIAGNOSIS — IMO0002 Reserved for concepts with insufficient information to code with codable children: Secondary | ICD-10-CM | POA: Diagnosis not present

## 2012-06-17 DIAGNOSIS — Z7901 Long term (current) use of anticoagulants: Secondary | ICD-10-CM | POA: Diagnosis not present

## 2012-06-17 DIAGNOSIS — E1065 Type 1 diabetes mellitus with hyperglycemia: Secondary | ICD-10-CM | POA: Diagnosis not present

## 2012-06-19 DIAGNOSIS — Z7901 Long term (current) use of anticoagulants: Secondary | ICD-10-CM | POA: Diagnosis not present

## 2012-06-23 DIAGNOSIS — G4730 Sleep apnea, unspecified: Secondary | ICD-10-CM | POA: Diagnosis not present

## 2012-07-03 DIAGNOSIS — Z7901 Long term (current) use of anticoagulants: Secondary | ICD-10-CM | POA: Diagnosis not present

## 2012-07-13 DIAGNOSIS — G479 Sleep disorder, unspecified: Secondary | ICD-10-CM | POA: Diagnosis not present

## 2012-07-13 DIAGNOSIS — G4769 Other sleep related movement disorders: Secondary | ICD-10-CM | POA: Diagnosis not present

## 2012-07-22 DIAGNOSIS — M545 Low back pain, unspecified: Secondary | ICD-10-CM | POA: Diagnosis not present

## 2012-07-22 DIAGNOSIS — E109 Type 1 diabetes mellitus without complications: Secondary | ICD-10-CM | POA: Diagnosis not present

## 2012-07-22 DIAGNOSIS — I4891 Unspecified atrial fibrillation: Secondary | ICD-10-CM | POA: Diagnosis not present

## 2012-07-22 DIAGNOSIS — I10 Essential (primary) hypertension: Secondary | ICD-10-CM | POA: Diagnosis not present

## 2012-07-29 DIAGNOSIS — E7800 Pure hypercholesterolemia, unspecified: Secondary | ICD-10-CM | POA: Diagnosis not present

## 2012-07-29 DIAGNOSIS — E039 Hypothyroidism, unspecified: Secondary | ICD-10-CM | POA: Diagnosis not present

## 2012-07-29 DIAGNOSIS — Z7901 Long term (current) use of anticoagulants: Secondary | ICD-10-CM | POA: Diagnosis not present

## 2012-07-29 DIAGNOSIS — H5200 Hypermetropia, unspecified eye: Secondary | ICD-10-CM | POA: Diagnosis not present

## 2012-07-29 DIAGNOSIS — E119 Type 2 diabetes mellitus without complications: Secondary | ICD-10-CM | POA: Diagnosis not present

## 2012-07-29 DIAGNOSIS — E1065 Type 1 diabetes mellitus with hyperglycemia: Secondary | ICD-10-CM | POA: Diagnosis not present

## 2012-07-29 DIAGNOSIS — E1039 Type 1 diabetes mellitus with other diabetic ophthalmic complication: Secondary | ICD-10-CM | POA: Diagnosis not present

## 2012-07-29 DIAGNOSIS — IMO0002 Reserved for concepts with insufficient information to code with codable children: Secondary | ICD-10-CM | POA: Diagnosis not present

## 2012-08-25 DIAGNOSIS — G8929 Other chronic pain: Secondary | ICD-10-CM | POA: Diagnosis not present

## 2012-08-25 DIAGNOSIS — E039 Hypothyroidism, unspecified: Secondary | ICD-10-CM | POA: Insufficient documentation

## 2012-08-25 DIAGNOSIS — IMO0002 Reserved for concepts with insufficient information to code with codable children: Secondary | ICD-10-CM | POA: Diagnosis not present

## 2012-08-25 DIAGNOSIS — M961 Postlaminectomy syndrome, not elsewhere classified: Secondary | ICD-10-CM | POA: Diagnosis not present

## 2012-09-17 DIAGNOSIS — Z7901 Long term (current) use of anticoagulants: Secondary | ICD-10-CM | POA: Diagnosis not present

## 2012-09-28 DIAGNOSIS — E1065 Type 1 diabetes mellitus with hyperglycemia: Secondary | ICD-10-CM | POA: Diagnosis not present

## 2012-09-28 DIAGNOSIS — IMO0002 Reserved for concepts with insufficient information to code with codable children: Secondary | ICD-10-CM | POA: Diagnosis not present

## 2012-09-28 DIAGNOSIS — Z7901 Long term (current) use of anticoagulants: Secondary | ICD-10-CM | POA: Diagnosis not present

## 2012-10-05 DIAGNOSIS — E1065 Type 1 diabetes mellitus with hyperglycemia: Secondary | ICD-10-CM | POA: Diagnosis not present

## 2012-10-05 DIAGNOSIS — IMO0002 Reserved for concepts with insufficient information to code with codable children: Secondary | ICD-10-CM | POA: Diagnosis not present

## 2012-10-29 DIAGNOSIS — R197 Diarrhea, unspecified: Secondary | ICD-10-CM | POA: Diagnosis not present

## 2012-10-29 DIAGNOSIS — Z7901 Long term (current) use of anticoagulants: Secondary | ICD-10-CM | POA: Diagnosis not present

## 2012-11-04 DIAGNOSIS — Z79899 Other long term (current) drug therapy: Secondary | ICD-10-CM | POA: Diagnosis not present

## 2012-11-04 DIAGNOSIS — G8929 Other chronic pain: Secondary | ICD-10-CM | POA: Diagnosis not present

## 2012-11-04 DIAGNOSIS — Z79891 Long term (current) use of opiate analgesic: Secondary | ICD-10-CM | POA: Insufficient documentation

## 2012-11-04 DIAGNOSIS — M961 Postlaminectomy syndrome, not elsewhere classified: Secondary | ICD-10-CM | POA: Diagnosis not present

## 2012-11-09 DIAGNOSIS — IMO0002 Reserved for concepts with insufficient information to code with codable children: Secondary | ICD-10-CM | POA: Diagnosis not present

## 2012-11-09 DIAGNOSIS — Z23 Encounter for immunization: Secondary | ICD-10-CM | POA: Diagnosis not present

## 2012-11-09 DIAGNOSIS — E1065 Type 1 diabetes mellitus with hyperglycemia: Secondary | ICD-10-CM | POA: Diagnosis not present

## 2012-11-23 DIAGNOSIS — Z7901 Long term (current) use of anticoagulants: Secondary | ICD-10-CM | POA: Diagnosis not present

## 2012-11-26 DIAGNOSIS — I4891 Unspecified atrial fibrillation: Secondary | ICD-10-CM | POA: Diagnosis not present

## 2012-11-30 DIAGNOSIS — B9689 Other specified bacterial agents as the cause of diseases classified elsewhere: Secondary | ICD-10-CM | POA: Diagnosis not present

## 2012-12-09 DIAGNOSIS — Z7901 Long term (current) use of anticoagulants: Secondary | ICD-10-CM | POA: Diagnosis not present

## 2012-12-16 IMAGING — US ABDOMEN ULTRASOUND LIMITED
1 series · 14 of 25 positions shown · non-contrast
Comparison: none

REASON FOR EXAM: complete  abnormal US common bile duct dilatation
COMMENTS:

PROCEDURE:     SOFI BIBA - SOFI BIBA ABDOMEN UPPER GENERAL  - November 11, 2011  [DATE]
RESULT:

[Series 1: abdomen ultrasound limited · 0.25mm/px · 78 acquisitions, 14 frames shown]
[im 1/78]
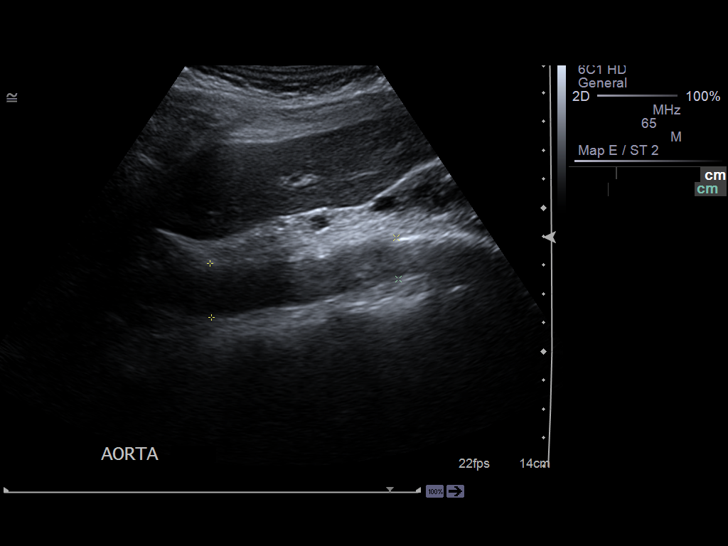
[im 7/78]
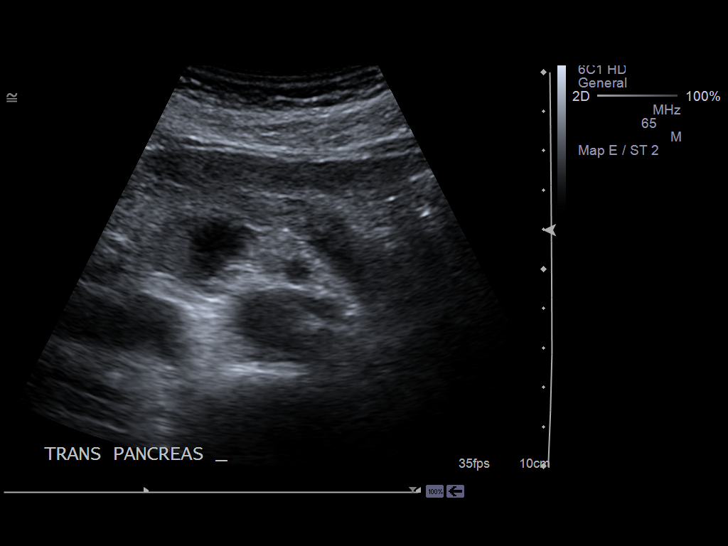
[im 13/78]
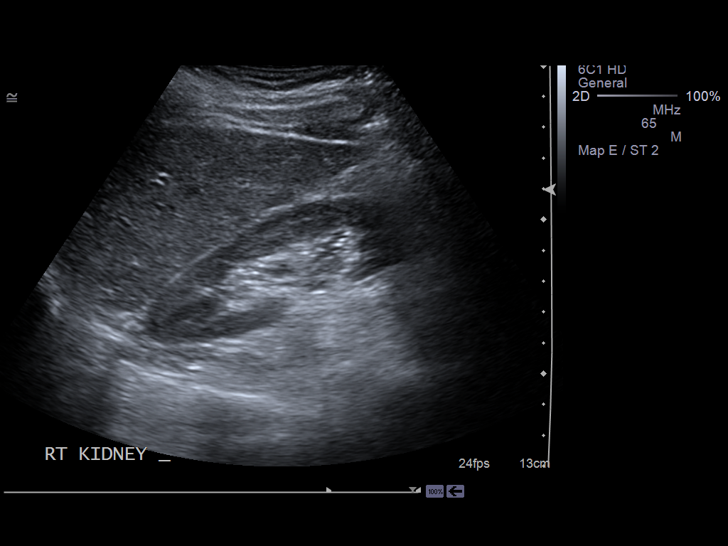
[im 20/78]
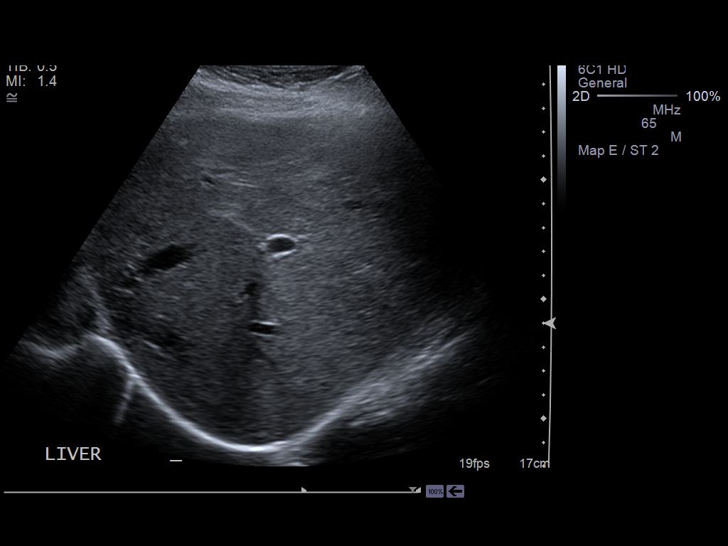
[im 26/78]
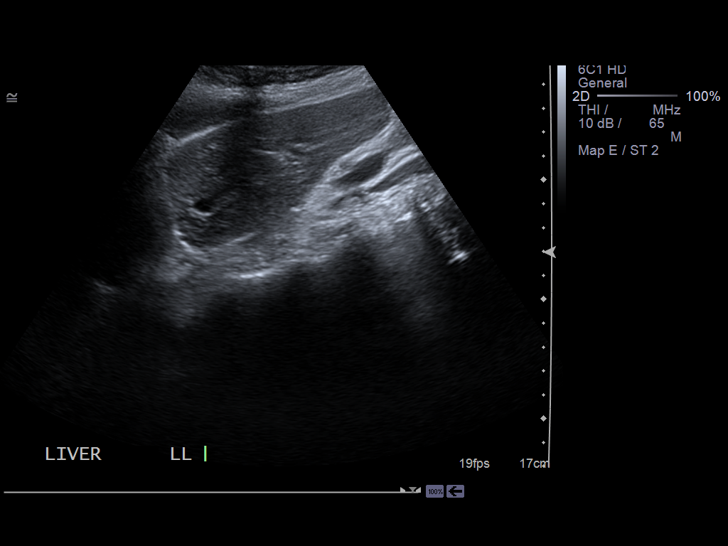
[im 29/78]
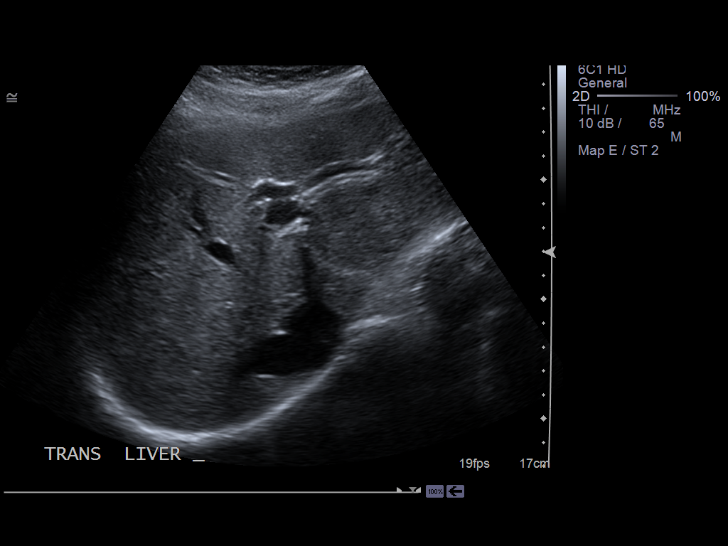
[im 36/78]
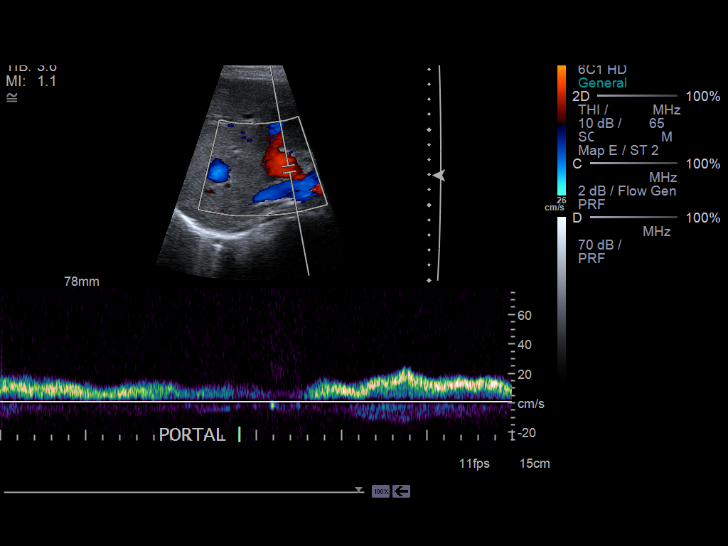
[im 42/78]
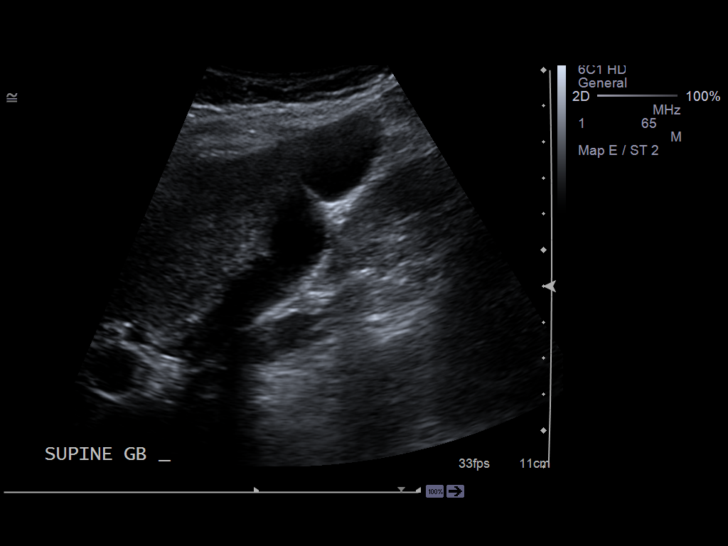
[im 49/78]
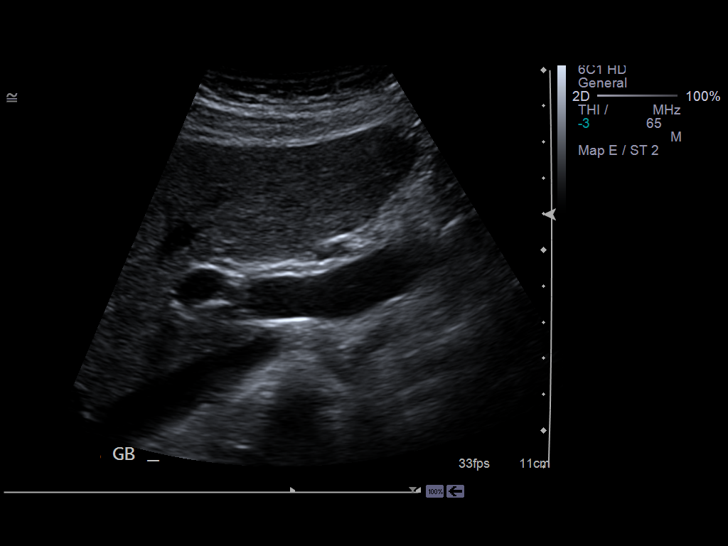
[im 52/78]
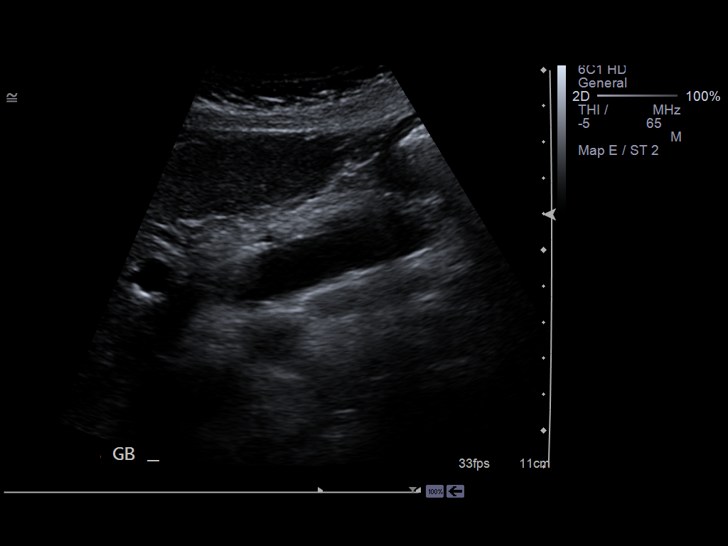
[im 58/78]
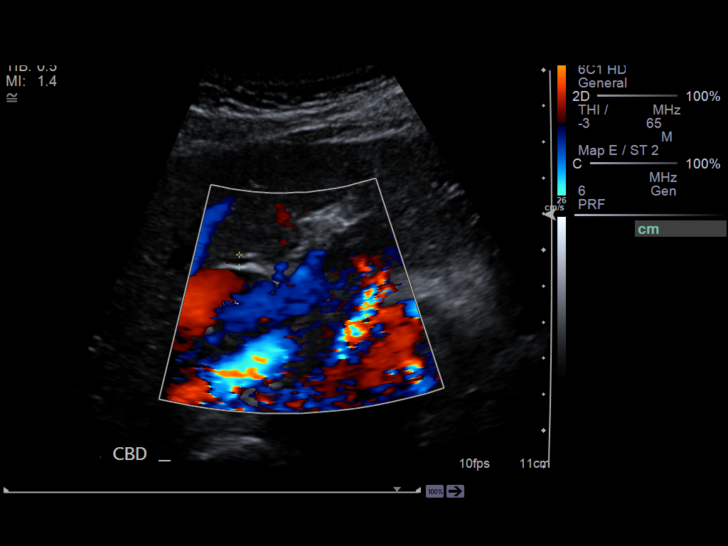
[im 65/78]
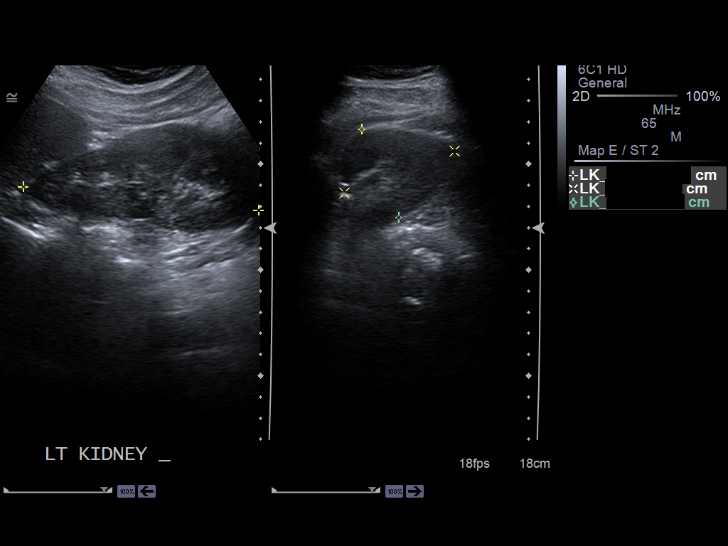
[im 71/78]
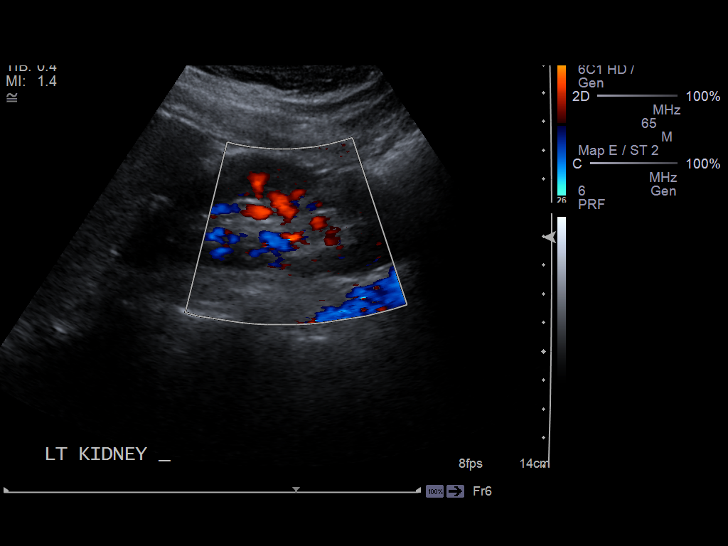
[im 78/78]
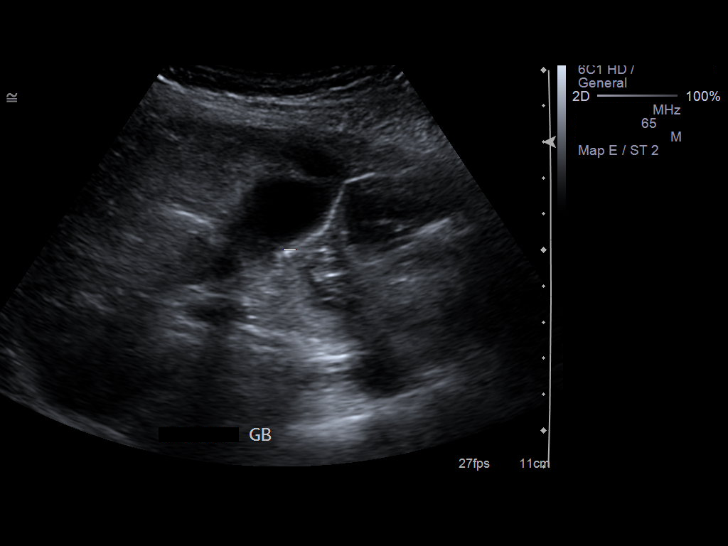

[14 of 25 positions shown; findings below may reference images not displayed]

FINDINGS: The liver demonstrates a homogeneous echotexture without evidence
of intrahepatic nor extrahepatic biliary ductal dilatation. Hepatopetal flow
is identified within the portal vein. The aorta is obscured by bowel gas.
The IVC is unremarkable. The pancreas is only partially visualized and is
grossly unremarkable.

The gallbladder fossa demonstrates no evidence of pericholecystic fluid,
gallstones, sludging, gallbladder wall thickening, nor a sonographic
Murphy's sign. Gallbladder wall thickness is 1.4 mm and the common bile duct
ranges from 3.8 to 6.1 mm in diameter. The right kidney measures 9.87 x
x 5.24 cm and the left 11.81 x 5.57 x 4.52 cm. There is no evidence of
hydronephrosis, solid or cystic masses, nor calculi. The spleen is
homogeneous in echotexture and is unremarkable.
IMPRESSION: Unremarkable abdominal ultrasound as described above.

## 2012-12-30 DIAGNOSIS — Z1231 Encounter for screening mammogram for malignant neoplasm of breast: Secondary | ICD-10-CM | POA: Diagnosis not present

## 2013-01-01 DIAGNOSIS — G8928 Other chronic postprocedural pain: Secondary | ICD-10-CM | POA: Diagnosis not present

## 2013-01-01 DIAGNOSIS — Z79899 Other long term (current) drug therapy: Secondary | ICD-10-CM | POA: Diagnosis not present

## 2013-01-01 DIAGNOSIS — M961 Postlaminectomy syndrome, not elsewhere classified: Secondary | ICD-10-CM | POA: Diagnosis not present

## 2013-01-22 DIAGNOSIS — Z7901 Long term (current) use of anticoagulants: Secondary | ICD-10-CM | POA: Diagnosis not present

## 2013-01-22 DIAGNOSIS — IMO0002 Reserved for concepts with insufficient information to code with codable children: Secondary | ICD-10-CM | POA: Diagnosis not present

## 2013-01-22 DIAGNOSIS — E1065 Type 1 diabetes mellitus with hyperglycemia: Secondary | ICD-10-CM | POA: Diagnosis not present

## 2013-01-26 DIAGNOSIS — I10 Essential (primary) hypertension: Secondary | ICD-10-CM | POA: Diagnosis not present

## 2013-01-26 DIAGNOSIS — Z0000 Encounter for general adult medical examination without abnormal findings: Secondary | ICD-10-CM | POA: Diagnosis not present

## 2013-01-26 DIAGNOSIS — E119 Type 2 diabetes mellitus without complications: Secondary | ICD-10-CM | POA: Diagnosis not present

## 2013-01-26 DIAGNOSIS — E039 Hypothyroidism, unspecified: Secondary | ICD-10-CM | POA: Diagnosis not present

## 2013-01-27 DIAGNOSIS — E1065 Type 1 diabetes mellitus with hyperglycemia: Secondary | ICD-10-CM | POA: Diagnosis not present

## 2013-01-27 DIAGNOSIS — IMO0002 Reserved for concepts with insufficient information to code with codable children: Secondary | ICD-10-CM | POA: Diagnosis not present

## 2013-02-22 DIAGNOSIS — E119 Type 2 diabetes mellitus without complications: Secondary | ICD-10-CM | POA: Diagnosis not present

## 2013-02-22 DIAGNOSIS — M549 Dorsalgia, unspecified: Secondary | ICD-10-CM | POA: Diagnosis not present

## 2013-02-22 DIAGNOSIS — J019 Acute sinusitis, unspecified: Secondary | ICD-10-CM | POA: Diagnosis not present

## 2013-02-23 DIAGNOSIS — E119 Type 2 diabetes mellitus without complications: Secondary | ICD-10-CM | POA: Diagnosis not present

## 2013-02-23 DIAGNOSIS — Z7901 Long term (current) use of anticoagulants: Secondary | ICD-10-CM | POA: Diagnosis not present

## 2013-02-23 DIAGNOSIS — E7800 Pure hypercholesterolemia, unspecified: Secondary | ICD-10-CM | POA: Diagnosis not present

## 2013-02-23 DIAGNOSIS — E039 Hypothyroidism, unspecified: Secondary | ICD-10-CM | POA: Diagnosis not present

## 2013-04-07 DIAGNOSIS — N951 Menopausal and female climacteric states: Secondary | ICD-10-CM | POA: Diagnosis not present

## 2013-04-07 DIAGNOSIS — Z01419 Encounter for gynecological examination (general) (routine) without abnormal findings: Secondary | ICD-10-CM | POA: Diagnosis not present

## 2013-04-07 DIAGNOSIS — Z7901 Long term (current) use of anticoagulants: Secondary | ICD-10-CM | POA: Diagnosis not present

## 2013-04-07 DIAGNOSIS — N952 Postmenopausal atrophic vaginitis: Secondary | ICD-10-CM | POA: Diagnosis not present

## 2013-04-13 DIAGNOSIS — R300 Dysuria: Secondary | ICD-10-CM | POA: Diagnosis not present

## 2013-04-13 DIAGNOSIS — N390 Urinary tract infection, site not specified: Secondary | ICD-10-CM | POA: Diagnosis not present

## 2013-04-13 DIAGNOSIS — G8929 Other chronic pain: Secondary | ICD-10-CM | POA: Diagnosis not present

## 2013-04-30 DIAGNOSIS — IMO0002 Reserved for concepts with insufficient information to code with codable children: Secondary | ICD-10-CM | POA: Diagnosis not present

## 2013-04-30 DIAGNOSIS — E1065 Type 1 diabetes mellitus with hyperglycemia: Secondary | ICD-10-CM | POA: Diagnosis not present

## 2013-05-06 DIAGNOSIS — I4891 Unspecified atrial fibrillation: Secondary | ICD-10-CM | POA: Diagnosis not present

## 2013-05-06 DIAGNOSIS — I10 Essential (primary) hypertension: Secondary | ICD-10-CM | POA: Diagnosis not present

## 2013-05-06 DIAGNOSIS — I059 Rheumatic mitral valve disease, unspecified: Secondary | ICD-10-CM | POA: Diagnosis not present

## 2013-05-07 DIAGNOSIS — IMO0002 Reserved for concepts with insufficient information to code with codable children: Secondary | ICD-10-CM | POA: Diagnosis not present

## 2013-05-07 DIAGNOSIS — E1065 Type 1 diabetes mellitus with hyperglycemia: Secondary | ICD-10-CM | POA: Diagnosis not present

## 2013-05-14 DIAGNOSIS — Z7901 Long term (current) use of anticoagulants: Secondary | ICD-10-CM | POA: Diagnosis not present

## 2013-06-04 DIAGNOSIS — M961 Postlaminectomy syndrome, not elsewhere classified: Secondary | ICD-10-CM | POA: Diagnosis not present

## 2013-06-04 DIAGNOSIS — G8929 Other chronic pain: Secondary | ICD-10-CM | POA: Diagnosis not present

## 2013-06-09 DIAGNOSIS — Z7901 Long term (current) use of anticoagulants: Secondary | ICD-10-CM | POA: Diagnosis not present

## 2013-07-07 DIAGNOSIS — Z7901 Long term (current) use of anticoagulants: Secondary | ICD-10-CM | POA: Diagnosis not present

## 2013-07-17 DIAGNOSIS — I10 Essential (primary) hypertension: Secondary | ICD-10-CM | POA: Insufficient documentation

## 2013-07-17 DIAGNOSIS — M549 Dorsalgia, unspecified: Secondary | ICD-10-CM | POA: Insufficient documentation

## 2013-07-17 DIAGNOSIS — E785 Hyperlipidemia, unspecified: Secondary | ICD-10-CM | POA: Insufficient documentation

## 2013-07-17 DIAGNOSIS — K8689 Other specified diseases of pancreas: Secondary | ICD-10-CM | POA: Insufficient documentation

## 2013-07-26 DIAGNOSIS — E119 Type 2 diabetes mellitus without complications: Secondary | ICD-10-CM | POA: Diagnosis not present

## 2013-07-26 DIAGNOSIS — E785 Hyperlipidemia, unspecified: Secondary | ICD-10-CM | POA: Diagnosis not present

## 2013-07-26 DIAGNOSIS — I4891 Unspecified atrial fibrillation: Secondary | ICD-10-CM | POA: Diagnosis not present

## 2013-07-26 DIAGNOSIS — I10 Essential (primary) hypertension: Secondary | ICD-10-CM | POA: Diagnosis not present

## 2013-07-27 DIAGNOSIS — E119 Type 2 diabetes mellitus without complications: Secondary | ICD-10-CM | POA: Diagnosis not present

## 2013-08-16 DIAGNOSIS — E1065 Type 1 diabetes mellitus with hyperglycemia: Secondary | ICD-10-CM | POA: Diagnosis not present

## 2013-08-16 DIAGNOSIS — IMO0002 Reserved for concepts with insufficient information to code with codable children: Secondary | ICD-10-CM | POA: Diagnosis not present

## 2013-08-25 DIAGNOSIS — Z7901 Long term (current) use of anticoagulants: Secondary | ICD-10-CM | POA: Diagnosis not present

## 2013-08-27 DIAGNOSIS — Z79899 Other long term (current) drug therapy: Secondary | ICD-10-CM | POA: Diagnosis not present

## 2013-08-27 DIAGNOSIS — G894 Chronic pain syndrome: Secondary | ICD-10-CM | POA: Diagnosis not present

## 2013-08-27 DIAGNOSIS — IMO0002 Reserved for concepts with insufficient information to code with codable children: Secondary | ICD-10-CM | POA: Diagnosis not present

## 2013-08-27 DIAGNOSIS — M961 Postlaminectomy syndrome, not elsewhere classified: Secondary | ICD-10-CM | POA: Diagnosis not present

## 2013-10-01 DIAGNOSIS — Z7901 Long term (current) use of anticoagulants: Secondary | ICD-10-CM | POA: Diagnosis not present

## 2013-10-26 ENCOUNTER — Ambulatory Visit (INDEPENDENT_AMBULATORY_CARE_PROVIDER_SITE_OTHER): Payer: Medicare Other | Admitting: Internal Medicine

## 2013-10-26 ENCOUNTER — Encounter: Payer: Self-pay | Admitting: Internal Medicine

## 2013-10-26 VITALS — BP 120/56 | HR 75 | Temp 98.4°F | Resp 12 | Ht 64.0 in | Wt 139.0 lb

## 2013-10-26 DIAGNOSIS — E1022 Type 1 diabetes mellitus with diabetic chronic kidney disease: Secondary | ICD-10-CM | POA: Insufficient documentation

## 2013-10-26 DIAGNOSIS — N182 Chronic kidney disease, stage 2 (mild): Secondary | ICD-10-CM

## 2013-10-26 DIAGNOSIS — E1042 Type 1 diabetes mellitus with diabetic polyneuropathy: Secondary | ICD-10-CM | POA: Diagnosis not present

## 2013-10-26 NOTE — Progress Notes (Signed)
Patient ID: Colleen Payne, female   DOB: 1950-02-09, 63 y.o.   MRN: [id number]  HPI: Colleen Payne is a 63 y.o.-year-old female, self-referred for management of DM1, uncontrolled, without complications. She was previously seen by Dr Colleen Payne.  Patient has been diagnosed with diabetes in 10 (63 y.o); she started on insulin at dx. Pt started on an insulin pump in 1994: Minimed 712 without CGM, uses Humalog in the pump.  Last hemoglobin A1c was: 07/27/2013: HbA1c 9.4%  Pump settings: - basal rates: 12 am: 0.50 units/h 2 am: 0.30 4 am: 0.20 7 am: 0.50 1 pm: 1.00 TDD from basal insulin: 82% (16.2 units) - ICR: 20 - target:  12 am: 130-130 6 am: 120-130 10 am: 130-130 - ISF: 80 - Insulin on Board: 4h - bolus wizard: rarely; usually overrides it TDD from bolus insulin: 18% (3.6 units) - mostly manual, rarely uses the bolus wizard - extended bolusing: not using - changes infusion site: q7 days - Meter: One Touch ultra link  Pt checks her sugars 3-8 a day and they are: - am: 95-219, 344, 350 - 2h after b'fast: n/c - before lunch: 204-400 - 2h after lunch: 179-329 - before dinner: 136-194, 334 - 2h after dinner: 69, 194, 319, 359 - bedtime: 289-359 - nighttime: between 12 am-4 am: 42, 53-150, 290    Has frequent lows. Lowest sugar was ~20 at night ~1 mo ago - EMT came at her house; she has hypoglycemia awareness at 70. No previous hypoglycemia admission. She has a glucagon kit at home, but husband has Parkinson and cannot use it. Highest sugar was 400ss. Only one hyperglycemia admission in the past.    Pt's meals are: - Breakfast: 1/2 PB sandwich (26 g carbs) - Lunch: 1/2 PB sandwich (26 g carbs) - Dinner: 1/2 PB sandwich (26 g carbs) - Snacks: 1- chips (16 g carbs)  - no CKD, last BUN/creatinine:  07/26/2013: 13/0.7 (GFR 85). She is on Ramipril. - last set of lipids: 187/133/58.2/102 She is not on a statin. - last eye exam was in 05/2013. No DR.  - + numbness and  tingling in her feet. On Neurontin 900-500-900.  I reviewed her chart and she also has a history of Gilbert sd., anxiety, MVP, Chronic LBP, Osteopenia, HTN, episode of Afib in 07/2010, pancreatic atrophy and dilated intrahepatic ducts  - dx 2012.  Last TSH: 07/27/2013: TSH 2.095  No FH of DM.  ROS: Constitutional: + weight loss, no fatigue, no subjective hyperthermia/hypothermia Eyes: no blurry vision, no xerophthalmia ENT: no sore throat, no nodules palpated in throat, no dysphagia/odynophagia, no hoarseness Cardiovascular: no CP/SOB/+ palpitations/no leg swelling Respiratory: no cough/SOB Gastrointestinal: no N/V/D/C Musculoskeletal: no muscle/joint aches Skin: no rashes Neurological: no tremors/numbness/tingling/dizziness Psychiatric: no depression/+ anxiety  Past Medical History  Diagnosis Date  . Diabetes mellitus without complication     Type I   . Hypertension   . Anxiety   . Depression   . Thyroid disease   . Atrial fibrillation   . MVP (mitral valve prolapse)    Past Surgical History  Procedure Laterality Date  . Back surgery  2004    3 sugeries between Sept and Nov   History   Social History Main Topics  . Smoking status: Former Smoker    Start date: 10/26/2001  . Smokeless tobacco: Not on file  . Alcohol Use: No  . Drug Use: No   Social History Narrative   Married - 41 yrs   Disabled -  back pain   3 children - 45, 38, 35 yrs      4 pregnancies   1 miscarriage      First menstrual cycle: 10 yrs   Last menstrual cycle: 42 yrs   Current Outpatient Rx  Name  Route  Sig  Dispense  Refill  . ALPRAZolam (XANAX) 0.25 MG tablet      0.25 mg as needed.          Marland Kitchen amitriptyline (ELAVIL) 25 MG tablet   Oral   Take 25 mg by mouth at bedtime.          . Buprenorphine (BUTRANS) 15 MCG/HR PTWK   Transdermal   Place onto the skin.         . Cholecalciferol (PA VITAMIN D-3) 2000 UNITS CAPS   Oral   Take 1 capsule by mouth daily.          .  citalopram (CELEXA) 20 MG tablet   Oral   Take 20 mg by mouth daily.          Marland Kitchen estrogen, conjugated,-medroxyprogesterone (PREMPRO) 0.3-1.5 MG per tablet      Take one tablet by mouth daily.         Marland Kitchen gabapentin (NEURONTIN) 300 MG capsule   Oral   Take 2,400 mg by mouth daily.          Marland Kitchen glucose blood (ONE TOUCH ULTRA TEST) test strip      Use as directed         . hydrochlorothiazide (HYDRODIURIL) 25 MG tablet   Oral   Take 25 mg by mouth daily.          . insulin lispro (HUMALOG) 100 UNIT/ML injection   Subcutaneous   Inject 100 Units into the skin daily. For use in pump         . levothyroxine (SYNTHROID, LEVOTHROID) 25 MCG tablet   Oral   Take 25 mcg by mouth daily before breakfast.          . lipase/protease/amylase (CREON) 12000 UNITS CPEP capsule   Oral   Take 12,000 Units by mouth.          . metoprolol succinate (TOPROL-XL) 25 MG 24 hr tablet   Oral   Take 50 mg by mouth daily with breakfast. 1 tablet with dinner.         . morphine (MSIR) 15 MG tablet   Oral   Take by mouth.         . ramipril (ALTACE) 10 MG capsule   Oral   Take 10 mg by mouth daily with breakfast.          . vitamin B-12 (CYANOCOBALAMIN) 1000 MCG tablet   Oral   Take 1,000 mcg by mouth daily.          . vitamin E 400 UNIT capsule   Oral   Take 400 Units by mouth daily.          Marland Kitchen warfarin (COUMADIN) 2.5 MG tablet   Oral   Take 2.5 mg by mouth daily with breakfast.          . warfarin (COUMADIN) 5 MG tablet   Oral   Take 5 mg by mouth daily with breakfast.           Allergies  Allergen Reactions  . Codeine Other (See Comments)    dizziness   Family History  Problem Relation Age of Onset  . Hypertension Mother   . Cancer  Mother     Colon  . Hypertension Father    PE: BP 120/56  Pulse 75  Temp(Src) 98.4 F (36.9 C) (Oral)  Resp 12  Ht _0  (1.626 m)  Wt 139 lb (63.05 kg)  BMI 23.85 kg/m2  SpO2 95% Wt Readings from Last 3  Encounters:  10/26/13 139 lb (63.05 kg)   Constitutional: normal weight, in NAD Eyes: PERRLA, EOMI, no exophthalmos ENT: moist mucous membranes, no thyromegaly, no cervical lymphadenopathy Cardiovascular: RRR, No MRG Respiratory: CTA B Gastrointestinal: abdomen soft, NT, ND, BS+ Musculoskeletal: no deformities, strength intact in all 4 Skin: moist, warm, no rashes Neurological: mild tremor with outstretched hands  ASSESSMENT: 1. DM1, uncontrolled, without complications  PLAN:  1. Patient with long-standing, uncontrolled DM1, on insulin pump. She has fluctuating sugars, I believe overall she is prone to highs, so she overrides the bolus wizard to give herself more insulin and she develops hypoglycemia as a consequence. We discussed to try to use the bolus wizard at all times, but I adjusted the settings so that she gets more insulin at bolusing. I also increased her basal rates in the first part of the day. - We discussed about changes to his insulin regimen, as follows:  Patient Instructions  Please change the pump settings as follows: - basal rates: 12 am: 0.50 units/h 2 am: 0.30 4 am: 0.20 7 am: 0.50 >> 5 am: 0.80 1 pm: 1.00  - ICR: 20 >>  5 am: 15 11 am: 20 - target:  12 am: 130-130 6 am: 120-130 10 am: 130-130 - ISF: 80 >> 70 - Insulin on Board: 4h  Please try to change the infusion site every 3-4 days.  Please use the bolus wizard all the time.  Please let me know if the sugars are consistently <80 or >200.  - continue checking sugars at different times of the day - check at least 4 times a day, rotating checks - discussed improving diet (!) - given foot care handout and explained the principles  - given instructions for hypoglycemia management "15-15 rule"  - advised for yearly eye exams - she is UTD - advised to get ketone strips - advised to always have Glu tablets with her - advised for a Med-alert bracelet mentioning "type 1 diabetes mellitus". - may need  DM education for further help with the pump: carb counting check, basal rate validation, extended bolusing, sick days rules, etc. - given instruction Re: exercising and driving in DM1 (pt instructions) - no signs of other autoimmune disorders - Return to clinic in 1 mo with sugar log   - time spent with the patient: 1 hour, of which >50% was spent in obtaining information about herdisease, reviewing previous labs, office visit notes per records from Dr Colleen Payne, previous pump settings, reviewing her pump download, and counseling pt about her condition (please see the discussed topics above), and developing a plan to prevent further hypoglycemia and hyperglycemia. We also discussed about proper diet. Pt had a number of questions which I addressed.

## 2013-10-26 NOTE — Patient Instructions (Signed)
Please change the pump settings as follows: - basal rates: 12 am: 0.50 units/h 2 am: 0.30 4 am: 0.20 7 am: 0.50 >> 5 am: 0.80 1 pm: 1.00  - ICR: 20 >>  5 am: 15 11 am: 20 - target:  12 am: 130-130 6 am: 120-130 10 am: 130-130 - ISF: 80 >> 70 - Insulin on Board: 4h  Please try to change the infusion site every 3-4 days.  Please use the bolus wizard all the time.  Please let me know if the sugars are consistently <80 or >200.  Basic Rules for Patients with Type I Diabetes Mellitus  1. The American Diabetes Association (ADA) recommended targets: - fasting sugar <130 - after meal sugar <180 - HbA1C <7%  2. Engage in ?150 min moderate exercise per week  3. Make sure you have ?8h of sleep every night as this helps both blood sugars and your weight.  4. Always keep a sugar log (not only record in your meter) and bring it to all appointments with [place].  5. If you are on a pump, know how to access the settings and to modify the parameters.  6.  Remember, you can always call the number on the back of the pump for emergencies related to the pump.  7. "15-15 rule" for hypoglycemia: if sugars are low, take 15 g of carbs** ("fast sugar" - e.g. 4 glucose tablets, 4 oz orange juice), wait 15 min, then check sugars again. If still <80, repeat. Continue  until your sugars >80, then eat a normal meal.   8. Teach family members and coworkers to inject glucagon. Have a glucagon set at home and one at work. They should call 911 after using the set.  9. If you are on a pump, set "insulin on board" time for 5 hours (if your sugars tend to be higher, can use 4 hours).   10. If you are on a pump, use the "dual wave bolus" setting for high fat foods (e.g. pizza). Start with a setting of 50%-50% (50% instant bolus and 50% prolonged bolus over 3h, for e.g.).    11. If you are on a pump, make sure the basal daily insulin dose is approximately equal (not larger) to the daily insulin you get from  boluses, otherwise you are at risk for hypoglycemia.  12. Check sugar before driving. If <100, correct, and only start driving if sugars rise ?100. Check sugar every hour when on a long drive.  13. Check sugar before exercising. If <100, correct, and only start exercising if sugars rise ?100. Check sugar every hour when on a long exercise routine and 1h after you finished exercising.   If >250, check urine for ketones. If you have moderate-large ketones in urine, do not start exercise. Hydrate yourself with clear liquids and correct the high sugar. Recheck sugars and ketones before attempting to exercise.  Be aware that you might need less insulin when exercising.  *intense, short, exercise bursts can increase your sugars, but  *less intense, longer (>1h), exercise routines can decrease your sugars.  If you are on a pump, you might need to decrease your basal rate by 10% or more (or even disconnect your pump) while you exercise to prevent low sugars. Do not disconnect your pump by more than 3 hours at a time! You also might need to decrease your insulin bolus for the meal prior to your exercise time by 20% or more.  14. Make sure you have a  MedAlert bracelet or pendant mentioning "Type I Diabetes Mellitus". If you have a prior episode of severe hypoglycemia or hypoglycemia unawareness, it should also mention this.  15. Please do not walk barefoot. Inspect your feet for sores/cuts and let us know if you have them.  16. Please call [facility] with any questions and concerns [phone]).   **E.g. of "fast carbs":   first choice (15 g):  1 tube glucose gel, GlucoPouch 15, 2 oz glucose liquid   second choice (15-16 g):  3 or 4 glucose tablets (best taken  with water), 15 Dextrose Bits chewable   third choice (15-20 g):   cup fruit juice,  cup regular soda, 1 cup skim milk,  1 cup sports drink   fourth choice (15-20 g):  1 small tube [place] gel (not frosting), 2 tbsp  raisins, 1 tbsp table sugar,  candy, jelly beans, gum drops - check package for carb amount   (adapted from: Lenice Pressman. "Insulin therapy and hypoglycemia" Endocrinol Metab Clin N Am 2012, 41: 57-87)

## 2013-10-28 DIAGNOSIS — I4891 Unspecified atrial fibrillation: Secondary | ICD-10-CM | POA: Diagnosis not present

## 2013-11-04 ENCOUNTER — Telehealth: Payer: Self-pay | Admitting: Internal Medicine

## 2013-11-04 MED ORDER — INSULIN LISPRO 100 UNIT/ML ~~LOC~~ SOLN: 100.0000 [IU] | Freq: Every day | SUBCUTANEOUS | Status: DC

## 2013-11-04 NOTE — Telephone Encounter (Signed)
Done

## 2013-11-04 NOTE — Telephone Encounter (Signed)
Patient called stating she needs rx called in   Humalog Pump   Diagnosis code  ICD-10 Number of vials of insulin  3 a month  Number vials ad directions on how to use   Pharmacy: [facility] [address]   Thank you

## 2013-11-04 NOTE — Telephone Encounter (Signed)
OK to send 3 Humalog vials with 3 refills, to be used in the insulin pump, ICD10 code E10.42 .

## 2013-11-04 NOTE — Telephone Encounter (Signed)
Please read note below and advise.  

## 2013-11-12 DIAGNOSIS — Z7901 Long term (current) use of anticoagulants: Secondary | ICD-10-CM | POA: Diagnosis not present

## 2013-11-12 DIAGNOSIS — E1065 Type 1 diabetes mellitus with hyperglycemia: Secondary | ICD-10-CM | POA: Diagnosis not present

## 2013-11-20 DIAGNOSIS — F325 Major depressive disorder, single episode, in full remission: Secondary | ICD-10-CM | POA: Insufficient documentation

## 2013-11-22 DIAGNOSIS — Z79899 Other long term (current) drug therapy: Secondary | ICD-10-CM | POA: Diagnosis not present

## 2013-11-22 DIAGNOSIS — I4891 Unspecified atrial fibrillation: Secondary | ICD-10-CM | POA: Diagnosis not present

## 2013-11-22 DIAGNOSIS — Z7901 Long term (current) use of anticoagulants: Secondary | ICD-10-CM | POA: Diagnosis not present

## 2013-11-22 DIAGNOSIS — G894 Chronic pain syndrome: Secondary | ICD-10-CM | POA: Diagnosis not present

## 2013-11-22 DIAGNOSIS — M5416 Radiculopathy, lumbar region: Secondary | ICD-10-CM | POA: Diagnosis not present

## 2013-11-22 DIAGNOSIS — Z9641 Presence of insulin pump (external) (internal): Secondary | ICD-10-CM | POA: Diagnosis not present

## 2013-11-22 DIAGNOSIS — E109 Type 1 diabetes mellitus without complications: Secondary | ICD-10-CM | POA: Diagnosis not present

## 2013-11-22 DIAGNOSIS — M961 Postlaminectomy syndrome, not elsewhere classified: Secondary | ICD-10-CM | POA: Diagnosis not present

## 2013-11-22 DIAGNOSIS — Z79891 Long term (current) use of opiate analgesic: Secondary | ICD-10-CM | POA: Diagnosis not present

## 2013-11-29 DIAGNOSIS — Z23 Encounter for immunization: Secondary | ICD-10-CM | POA: Diagnosis not present

## 2013-11-29 DIAGNOSIS — E780 Pure hypercholesterolemia: Secondary | ICD-10-CM | POA: Diagnosis not present

## 2013-11-29 DIAGNOSIS — F3341 Major depressive disorder, recurrent, in partial remission: Secondary | ICD-10-CM | POA: Diagnosis not present

## 2013-11-29 DIAGNOSIS — K868 Other specified diseases of pancreas: Secondary | ICD-10-CM | POA: Diagnosis not present

## 2013-11-29 DIAGNOSIS — I10 Essential (primary) hypertension: Secondary | ICD-10-CM | POA: Diagnosis not present

## 2013-11-29 DIAGNOSIS — I480 Paroxysmal atrial fibrillation: Secondary | ICD-10-CM | POA: Diagnosis not present

## 2013-11-29 DIAGNOSIS — I4891 Unspecified atrial fibrillation: Secondary | ICD-10-CM | POA: Insufficient documentation

## 2013-11-29 DIAGNOSIS — E1022 Type 1 diabetes mellitus with diabetic chronic kidney disease: Secondary | ICD-10-CM | POA: Diagnosis not present

## 2013-11-30 ENCOUNTER — Ambulatory Visit: Payer: Medicare Other | Admitting: Internal Medicine

## 2013-11-30 ENCOUNTER — Ambulatory Visit (INDEPENDENT_AMBULATORY_CARE_PROVIDER_SITE_OTHER): Payer: Medicare Other | Admitting: Internal Medicine

## 2013-11-30 ENCOUNTER — Encounter: Payer: Medicare Other | Attending: Internal Medicine | Admitting: Nutrition

## 2013-11-30 ENCOUNTER — Encounter: Payer: Self-pay | Admitting: Internal Medicine

## 2013-11-30 VITALS — BP 122/62 | HR 62 | Temp 98.5°F | Resp 12 | Wt 138.0 lb

## 2013-11-30 DIAGNOSIS — E1042 Type 1 diabetes mellitus with diabetic polyneuropathy: Secondary | ICD-10-CM | POA: Diagnosis not present

## 2013-11-30 DIAGNOSIS — Z713 Dietary counseling and surveillance: Secondary | ICD-10-CM | POA: Diagnosis not present

## 2013-11-30 DIAGNOSIS — Z794 Long term (current) use of insulin: Secondary | ICD-10-CM | POA: Diagnosis not present

## 2013-11-30 NOTE — Patient Instructions (Signed)
Please change the pump settings as follows: - basal rates: 12 am: 0.50 units/h >> 0.30 2 am: 0.30 >> 0.20 4 am: 0.80 >> 0.60 7 am: 0.50 >> 0.40 1 pm: 1.00  10 pm: 1.00 >> 0.80 - ICR:  5 am: 15 11 am: 20 - target:  12 am: 130-130 6 am: 120-130 10 am: 130-130 - ISF: 70 - Insulin on Board: 4h  Please return in 1 month.

## 2013-11-30 NOTE — Progress Notes (Signed)
Patient ID: Colleen Payne, female   DOB: February 27, 1950, 63 y.o.   MRN: [id number]  HPI: Colleen Payne is a 63 y.o.-year-old female, self-referred for management of DM1, uncontrolled, with complications (PN). Last visit 1 mo ago.  Since last visit, she had a lot of stress in her family is first of daughter passed away with breast cancer.  Patient has been diagnosed with diabetes in 64 (63 y.o);; she started on insulin at dx. Pt started on an insulin pump in 1994: Minimed 712 without CGM, uses Humalog in the pump.  Last hemoglobin A1c was: 10/2013: HbA1c 8.0% 07/27/2013: HbA1c 9.4%  Pump settings: - basal rates: 12 am: 0.50 units/h 2 am: 0.30 4 am: 0.80 7 am: 0.50  1 pm: 1.00  - ICR:  5 am: 15 11 am: 20 - target:  12 am: 130-130 6 am: 120-130 10 am: 130-130 - ISF: 70 - Insulin on Board: 4h  TDD from bolus insulin: 12% (3.6 units)  TDD from bolus insulin: 88% (18 units)  - extended bolusing: not using - changes infusion site: q7 days - Meter: One Touch ultra link  Pt checks her sugars 3-8 a day and they are: - am: 95-219, 344, 350 >> <40-149 - 2h after b'fast: n/c >> 47-299 - before lunch: 204-400 >> 84-207 - 2h after lunch: 179-329 >> 123-250 - before dinner: 136-194, 334 >> 43-230, 342 - 2h after dinner: 69, 194, 319, 359, 87-337 - bedtime: 289-359 >> 73-202, 307 - nighttime: between 12 am-4 am: 42, 53-150, 290 >> <40-128, 178    Has frequent lows. Lowest was <40; she has hypoglycemia awareness at 70. No previous hypoglycemia admission. She has a glucagon kit at home, but husband has Parkinson and cannot use it. Highest sugar was 300. Only one hyperglycemia admission in the past.    Pt's meals are: - Breakfast: 1/2 PB sandwich (26 g carbs) - Lunch: 1/2 PB sandwich (26 g carbs) - Dinner: 1/2 PB sandwich (26 g carbs) - Snacks: 1- chips (16 g carbs)  - no CKD, last BUN/creatinine:  07/26/2013: 13/0.7 (GFR 85). She is on Ramipril. - last set of  lipids: 187/133/58.2/102 She is not on a statin. - last eye exam was in 05/2013. No DR.  - + numbness and tingling in her feet. On Neurontin 900-500-900.  I reviewed her chart and she also has a history of Gilbert sd., anxiety, MVP, Chronic LBP, Osteopenia, HTN, episode of Afib in 07/2010, pancreatic atrophy and dilated intrahepatic ducts  - dx 2012.  Last TSH: 07/27/2013: TSH 2.095  ROS: Constitutional: no weight loss, no fatigue, no subjective hyperthermia/hypothermia Eyes: no blurry vision, no xerophthalmia ENT: no sore throat, no nodules palpated in throat, no dysphagia/odynophagia, no hoarseness Cardiovascular: no CP/SOB/ palpitations/no leg swelling Respiratory: no cough/SOB Gastrointestinal: no N/V/D/C Musculoskeletal: no muscle/joint aches Skin: no rashes Neurological: no tremors/numbness/tingling/dizziness  I reviewed pt's medications, allergies, PMH, social hx, family hx and no changes required, except as mentioned above.  PE: BP 122/62 mmHg  Pulse 62  Temp(Src) 98.5 F (36.9 C) (Oral)  Resp 12  Wt 138 lb (62.596 kg)  SpO2 97% Wt Readings from Last 3 Encounters:  11/30/13 138 lb (62.596 kg)  10/26/13 139 lb (63.05 kg)   Constitutional: normal weight, in NAD Eyes: PERRLA, EOMI, no exophthalmos ENT: moist mucous membranes, no thyromegaly, no cervical lymphadenopathy Cardiovascular: RRR, No MRG Respiratory: CTA B Gastrointestinal: abdomen soft, NT, ND, BS+ Musculoskeletal: no deformities, strength intact in all 4 Skin: moist, warm,  no rashes Neurological: mild tremor with outstretched hands  ASSESSMENT: 1. DM1, uncontrolled, with complications - PN - on Neurontin  PLAN:  1. Patient with long-standing, uncontrolled DM1, on insulin pump. She has widely fluctuating sugars, but there is a clear pattern of low sugars at night, that will increase the very high sugars throughout the day. She had many lows less than 40, and she can also get as high as 300s. We  discussed that it is impossible to regulate her control without her bolusing for every meal, but for now, will address first her low blood sugars: - We discussed about changes to his insulin regimen, as follows:  Patient Instructions  Please change the pump settings as follows: - basal rates: 12 am: 0.50 units/h >> 0.30 2 am: 0.30 >> 0.20 4 am: 0.80 >> 0.60 7 am: 0.50 >> 0.40 1 pm: 1.00  10 pm: 1.00 >> 0.80 - ICR:  5 am: 15 11 am: 20 - target:  12 am: 130-130 6 am: 120-130 10 am: 130-130 - ISF: 70 - Insulin on Board: 4h  Please return in 1 month.   - continue checking sugars at different times of the day - check at least 4 times a day, rotating checks - again advised her to change her infusion site every 3 days rather than once a week, as she is doing now - advised for yearly eye exams - she is UTD - I discussed with [name] (DM education) about pt and actually had her see [name] right after our appt for further advice and identifying barriers for better BP control  - no signs of other autoimmune disorders - Return to clinic in 1 mo with sugar log   - time spent with the patient: 40 min, of which >50% was spent in obtaining information about previous labs, reviewing previous pump settings, reviewing her pump download, and counseling pt about her condition (please see the discussed topics above), and developing a plan to prevent further hypoglycemia and hyperglycemia.

## 2013-11-30 NOTE — Progress Notes (Signed)
Pump download shows patient having many low blood sugars between 7-8AM, and then rebounding to the 200s 3-5 hours later.  She says that she is treating the lows with 2-3 glucose tablets and then eating 3T of peanut butter.  I explained to her that 3 glucose tablets will raise her blood sugar about 50 points, and she did not know this.  She is eating the peanut butter because she was told this several years ago.  I explained that it is not necessary to do this, but that if she want to, she could eat maybe 1T.  She agreed to do this.   She was given some glucose tablet to keep at her bedside for this.   She is not changing her infusion sets but once every 7-8 days.  She says that she usually fills the cartridge with 150u of insulin--and this will last her about 6-7 days.  I explained that the insulin does not get absorbed from the site as well after 3-4 days, and then it will sit under the skin, and get absorbed at times that we can not always predict-causing unexplained lows and highs.  She says she changes her sit out when the blood sugars start to rise.  She has 2 areas on her abdominal sites that are hardened, and she was told to stay away from those sites.  She was shown other sites to try on her upper buttocks and sides.  She agreed to try theses.    We also discussed the fact that she is bolusing only once or twice a day.  She says that she only eats meals once or twice a day.   Bfast is pop tart which she boluses for, and then will snack on low calorie items like veg. Or peanut butter all afternoon until supper.  Supper is usually 1-2 servings of starches, with protein.  It appears she is counting carbs correctly.    She was given some infusion sets( Rapid Ds) to try.  These will give her a better ability to reach around her body and insert the infusion sets into her upper buttocks areas.  She was also given some cartridges to help with her costs, in the hopes that she will change them q 3days as  recommended.

## 2013-12-01 NOTE — Patient Instructions (Addendum)
1.  Change cartridge and infusion sets every 3 days 2.  Try new infusion sites in the upper buttocks, the side of the abdomen, and lower buttocks 3.  Treat low blood sugars with 2-3 glucose tablets and only 1 T of peanut butter

## 2013-12-17 DIAGNOSIS — I480 Paroxysmal atrial fibrillation: Secondary | ICD-10-CM | POA: Diagnosis not present

## 2013-12-17 DIAGNOSIS — J321 Chronic frontal sinusitis: Secondary | ICD-10-CM | POA: Insufficient documentation

## 2013-12-17 DIAGNOSIS — Z7901 Long term (current) use of anticoagulants: Secondary | ICD-10-CM | POA: Diagnosis not present

## 2013-12-20 DIAGNOSIS — Z7901 Long term (current) use of anticoagulants: Secondary | ICD-10-CM | POA: Diagnosis not present

## 2013-12-20 DIAGNOSIS — E780 Pure hypercholesterolemia: Secondary | ICD-10-CM | POA: Diagnosis not present

## 2013-12-20 DIAGNOSIS — E039 Hypothyroidism, unspecified: Secondary | ICD-10-CM | POA: Diagnosis not present

## 2013-12-20 DIAGNOSIS — E119 Type 2 diabetes mellitus without complications: Secondary | ICD-10-CM | POA: Diagnosis not present

## 2014-01-03 DIAGNOSIS — M961 Postlaminectomy syndrome, not elsewhere classified: Secondary | ICD-10-CM | POA: Diagnosis not present

## 2014-01-03 DIAGNOSIS — G894 Chronic pain syndrome: Secondary | ICD-10-CM | POA: Diagnosis not present

## 2014-01-03 DIAGNOSIS — Z79899 Other long term (current) drug therapy: Secondary | ICD-10-CM | POA: Diagnosis not present

## 2014-01-03 DIAGNOSIS — Z79891 Long term (current) use of opiate analgesic: Secondary | ICD-10-CM | POA: Diagnosis not present

## 2014-01-03 DIAGNOSIS — M5416 Radiculopathy, lumbar region: Secondary | ICD-10-CM | POA: Diagnosis not present

## 2014-01-07 ENCOUNTER — Ambulatory Visit (INDEPENDENT_AMBULATORY_CARE_PROVIDER_SITE_OTHER): Payer: Medicare Other | Admitting: Internal Medicine

## 2014-01-07 ENCOUNTER — Encounter: Payer: Self-pay | Admitting: Internal Medicine

## 2014-01-07 VITALS — BP 126/72 | HR 62 | Temp 98.5°F | Resp 12 | Wt 138.0 lb

## 2014-01-07 DIAGNOSIS — E1042 Type 1 diabetes mellitus with diabetic polyneuropathy: Secondary | ICD-10-CM | POA: Diagnosis not present

## 2014-01-07 NOTE — Progress Notes (Signed)
Patient ID: Colleen Payne, female   DOB: [birth year], 63 y.o.   MRN: [id number]  HPI: Colleen Payne is a 63 y.o.-year-old female, self-referred for management of DM1, uncontrolled, with complications (PN). Last visit 1 mo ago.  Since last visit, she had a sinus inf for 3 weeks >> now finished ABx.  Patient has been diagnosed with diabetes in 40 (63 y.o); she started on insulin at dx. Pt started on an insulin pump in 1994: Minimed 712 without CGM, uses Humalog in the pump.  Last hemoglobin A1c was: 11/2013: HbA1c 8.2% 10/2013: HbA1c 8.0% 07/27/2013: HbA1c 9.4%  Pump settings - see previous visit changes in both: - basal rates: 12 am: 0.50 units/h >> 0.30 2 am: 0.30 >> 0.20 4 am: 0.80 >> 0.60 7 am: 0.50 >> 0.40 1 pm: 1.00  10 pm: 1.00 >> 0.80 - ICR:  5 am: 15 11 am: 20 - target:  12 am: 130-130 6 am: 120-130 10 am: 130-130 - ISF: 70 - Insulin on Board: 4h  Her total daily  insulin dose percentages have improved:  TDD from bolus insulin: 12% >> 23% (3.6 >> 4.6 units)  TDD from bolus insulin: 88% >> 77% (18 >> 15.8 units)  - extended bolusing: not using - changes infusion site: q7 days - Meter: One Touch ultra link  Pt checks her sugars 3-8 a day and they are (ave 174 +/- 90): - am: 95-219, 344, 350 >> <40-149 >> 77-208, 299 - 2h after b'fast: n/c >> 47-299 >> 115-353 - before lunch: 204-400 >> 84-207 >> 56, 100-302 - 2h after lunch: 179-329 >> 123-250 >> 210-368 - before dinner: 136-194, 334 >> 43-230 >> 61, 74-342 - 2h after dinner: 69, 194, 319, 359, 87-337 >> 64, 147-188, 260 - bedtime: 289-359 >> 73-202, 307 >> 48, 135-147 - nighttime: between 12 am-4 am: 42, 53-150, 290 >> <40-128, 178 >> <40 x1, 49-241  Comparison: 11/30/2013:   01/07/2014:  Lowest was <40; she has hypoglycemia awareness at 70. No previous hypoglycemia admission. She has a glucagon kit at home, but husband has Parkinson and cannot use it. Highest sugar was 300. Only one hyperglycemia  admission in the past.    Pt's meals are: - Breakfast: 1/2 PB sandwich (26 g carbs) - Lunch: 1/2 PB sandwich (26 g carbs) - Dinner: 1/2 PB sandwich (26 g carbs) - Snacks: 1- chips (16 g carbs)  - no CKD, last BUN/creatinine:  07/26/2013: 13/0.7 (GFR 85). She is on Ramipril. - last set of lipids: 187/133/58.2/102 She is not on a statin. - last eye exam was in 05/2013. No DR.  - + numbness and tingling in her feet. On Neurontin 900-500-900.  I reviewed her chart and she also has a history of Gilbert sd., anxiety, MVP, Chronic LBP, Osteopenia, HTN, episode of Afib in 07/2010, pancreatic atrophy and dilated intrahepatic ducts  - dx 2012.  Last TSH: 07/27/2013: TSH 2.095  ROS: Constitutional: no weight loss, no fatigue, no subjective hyperthermia/hypothermia Eyes: no blurry vision, no xerophthalmia ENT: no sore throat, no nodules palpated in throat, no dysphagia/odynophagia, no hoarseness Cardiovascular: no CP/SOB/ palpitations/no leg swelling Respiratory: no cough/SOB Gastrointestinal: no N/V/D/C Musculoskeletal: no muscle/joint aches Skin: no rashes Neurological: no tremors/numbness/tingling/dizziness  I reviewed pt's medications, allergies, PMH, social hx, family hx, and changes were documented in the history of present illness. Otherwise, unchanged from my initial visit note.  PE: BP 126/72 mmHg  Pulse 62  Temp(Src) 98.5 F (36.9 C) (Oral)  Resp 12  Wt 138 lb (62.596 kg)  SpO2 97% Wt Readings from Last 3 Encounters:  01/07/14 138 lb (62.596 kg)  11/30/13 138 lb (62.596 kg)  10/26/13 139 lb (63.05 kg)   Constitutional: normal weight, in NAD Eyes: PERRLA, EOMI, no exophthalmos ENT: moist mucous membranes, no thyromegaly, no cervical lymphadenopathy Cardiovascular: RRR, No MRG Respiratory: CTA B Gastrointestinal: abdomen soft, NT, ND, BS+ Musculoskeletal: no deformities, strength intact in all 4 Skin: moist, warm, no rashes Neurological: mild tremor with  outstretched hands  ASSESSMENT: 1. DM1, uncontrolled, with complications - PN - on Neurontin  PLAN:  1. Patient with long-standing, uncontrolled DM1, on insulin pump. Her sugars are slightly improved compared to last visit, in the sense that she does not have as many lows as before. However, she is still not bolusing enough (0-3 times a day) which is definitely suboptimal. Subsequently, her sugars increased throughout the day and decrease close to bedtime due to correction of her post dinner highs. - At this visit, we reviewed her pump download together, and I advised her to check her sugars 15 minutes before every meal, and a number of carbs that she plans to eat, and bolus as advised by the bolus wizard. This will lead to at least 3 boluses a day. I am hoping that she will need to do many correction boluses. If her bolus amount increases, I am afraid that she may drop her sugars during the day, so I will decrease her basal rate from 1 to 10 PM by 10%. She does have a few lows in the middle of the day now. - We  again discussed that it is impossible to regulate her control without her bolusing for every meal; I do acknowledge that she is doing slightly better compared to last time, but we are still far from goal  - We discussed about changes to her insulin regimen, as follows:  Patient Instructions  Please change the pump settings as follows: - basal rates: 12 am: 0.30 units/h  2 am: 0.20  4 am: 0.60  7 am: 0.40 1 pm: 1.00 >> 0.90 10 pm: 0.80 - ICR:  5 am: 15 11 am: 20 - target:  12 am: 130-130 6 am: 120-130 10 am: 130-130 - ISF: 70 - Insulin on Board: 4h  Please return in 1 month.   Please schedule an appt with [name] (Diabetes Education) in 2 weeks.   Please check sugars and start bolusing 15 min before every meal.  Please change the pump site every 3 days.  - We also discussed about sick days rules and I gave her written instructions - continue checking sugars at  different times of the day - check at least 4 times a day, rotating checks - again advised her to change her infusion site every 3 days rather than once 7-10 days, as she is doing now. I explained that the infusion sites will get inflamed and her insulin absorption will decrease if she does not change them frequently  - she is UTD with eye exam patient was again referred to see [name] (DM education)  - no signs of other autoimmune disorders - Return to clinic in 1 mo with sugar log   - time spent with the patient: 40 min, of which >50% was spent in reviewing labs, pump settings, reviewing her pump download, discussing the conjuncture of her low and high CBGs and counseling pt about Sick day rules, and proper pump management (please see the discussed topics above),  and developing a plan to prevent further hypoglycemia and hyperglycemia.

## 2014-01-07 NOTE — Patient Instructions (Addendum)
Please change the pump settings as follows: - basal rates: 12 am: 0.30 units/h  2 am: 0.20  4 am: 0.60  7 am: 0.40 1 pm: 1.00 >> 0.90 10 pm: 0.80 - ICR:  5 am: 15 11 am: 20 - target:  12 am: 130-130 6 am: 120-130 10 am: 130-130 - ISF: 70 - Insulin on Board: 4h  Please return in 1 month.   Please schedule an appt with [name] (Diabetes Education) in 2 weeks.   Please check sugars and start bolusing 15 min before every meal.  Please change the pump site every 3 days.  Diabetes and Sick Day Management Blood sugar (glucose) can be more difficult to control when you are sick. Colds, fever, flu, nausea, vomiting, and diarrhea are all examples of common illnesses that can cause problems for people with diabetes. Loss of body fluids (dehydration) from fever, vomiting, diarrhea, infection, and the stress of a sickness can all cause blood glucose levels to increase. Because of this, it is very important to take your diabetes medicines and to eat some form of carbohydrate food when you are sick. Liquid or soft foods are often tolerated, and they help to replace fluids. HOME CARE INSTRUCTIONS These main guidelines are intended for managing a short-term (24 hours or less) sickness:  Take your usual dose of insulin or oral diabetes medicine. An exception would be if you take any form of metformin. If you cannot eat or drink, you can become dehydrated and should not take this medicine.  Continue to take your insulin even if you are unable to eat solid foods or are vomiting. Your insulin dose may stay the same, or it may need to be increased when you are sick.  You will need to test your blood glucose more often, generally every 2-4 hours. If you have type 1 diabetes, test your urine for ketones every 4 hours. If you have type 2 diabetes, test your urine for ketones as directed by your health care provider.  Eat some form of food that contains carbohydrates. The carbohydrates can be in  solid or liquid form. You should eat 45-50 g of carbohydrates every 3-4 hours.  Replace fluids if you have a fever, vomit, or have diarrhea. Ask your health care provider for specific rehydration instructions.  Watch carefully for the signs of ketoacidosis if you have type 1 diabetes. Call your health care provider if any of the following symptoms are present, especially in children:  Moderate to large ketones in the urine along with a high blood glucose level.  Severe nausea.  Vomiting.  Diarrhea.  Abdominal pain.  Rapid breathing.  Drink extra liquids that do not contain sugar such as water.  Be careful with over-the-counter medicines. Read the labels. They may contain sugar or types of sugars that can increase your blood glucose level. Food Choices for Illness All of the food choices below contain about 15 g of carbohydrates. Plan ahead and keep some of these foods around.    to  cup carbonated beverage containing sugar. Carbonated beverages will usually be better tolerated if they are opened and left at room temperature for a few minutes.   of a twin frozen ice pop.   cup regular gelatin.   cup juice.   cup ice cream or frozen yogurt.   cup cooked cereal.   cup sherbet.  1 cup clear broth or soup.  1 cup cream soup.   cup regular custard.   cup regular pudding.  1 cup sports drink.  1 cup plain yogurt.  1 slice toast.  6 squares saltine crackers.  5 vanilla wafers. SEEK MEDICAL CARE IF:   You are unable to drink fluids, even small amounts.  You have nausea and vomiting for more than 6 hours.  You have diarrhea for more than 6 hours.  Your blood glucose level is more than 240 mg/dL, even with additional insulin.  There is a change in mental status.  You develop an additional serious sickness.  You have been sick for 2 days and are not getting better.  You have a fever. SEEK IMMEDIATE MEDICAL CARE IF:  You have difficulty  breathing.  You have moderate to large ketone levels. MAKE SURE YOU:  Understand these instructions.  Will watch your condition.  Will get help right away if you are not doing well or get worse. Document Released: 01/10/2003 Document Revised: 05/24/2013 Document Reviewed: 06/16/2012 [facility] Patient Information 2015 [place], [place]. This information is not intended to replace advice given to you by your health care provider. Make sure you discuss any questions you have with your health care provider.

## 2014-01-24 ENCOUNTER — Encounter: Payer: Medicare Other | Admitting: Nutrition

## 2014-02-18 ENCOUNTER — Encounter: Payer: Self-pay | Admitting: Internal Medicine

## 2014-02-18 ENCOUNTER — Ambulatory Visit (INDEPENDENT_AMBULATORY_CARE_PROVIDER_SITE_OTHER): Payer: Medicare Other | Admitting: Internal Medicine

## 2014-02-18 VITALS — BP 122/80 | HR 63 | Temp 97.7°F | Resp 12 | Wt 138.6 lb

## 2014-02-18 DIAGNOSIS — E1042 Type 1 diabetes mellitus with diabetic polyneuropathy: Secondary | ICD-10-CM | POA: Diagnosis not present

## 2014-02-18 NOTE — Progress Notes (Signed)
Patient ID: Colleen Payne, female   DOB: 1950/08/21, 64 y.o.   MRN: [id number]  HPI: Colleen Payne is a 64 y.o.-year-old female, self-referred for management of DM1, dx 1987, uncontrolled, with complications (PN). Last visit 1.5 mo ago.  Pt started on an insulin pump in 1994: Minimed 712 without CGM, uses Humalog in the pump. She is overwhelmed by the pump, and she does not use it at maximum capacity.   Last hemoglobin A1c was: 11/2013: HbA1c 8.2% 10/2013: HbA1c 8.0% 07/27/2013: HbA1c 9.4%  Pump settings - see previous visit changes in both: - basal rates: 12 am: 0.30 units/h  2 am: 0.20  4 am: 0.60  7 am: 0.40 1 pm: 1.00 >> 0.90 (she did not make this change!) 10 pm: 0.80 - ICR:  5 am: 15 11 am: 20 - target:  12 am: 130-130 6 am: 120-130 10 am: 130-130 - ISF: 70 - Insulin on Board: 4h She is changing her infusion site every 7-9 days as opposed to every 3 days, despite repeated advice. She mentions that she cannot afford to change them more frequently.  Her total daily  insulin dose percentages are still poor - she is not bolusing enough, she has days with 0 or 1 bolus a day:  TDD from bolus insulin: 12% >> 23% (4.6 units)  TDD from basal insulin: 88% >> 77% (15.8 units)  - extended bolusing: not using - changes infusion site: q7 days - Meter: One Touch ultra link  Pt checks her sugars 3-8 a day and they are (ave 174 +/- 90 >> ave 170 +/- 87 now):  - am: 95-219, 344, 350 >> <40-149 >> 77-208, 299 >> 52-203 - 2h after b'fast: n/c >> 47-299 >> 115-353 >> 62-378 - before lunch: 204-400 >> 84-207 >> 56, 100-302 >> 48-302 - 2h after lunch: 179-329 >> 123-250 >> 210-368 >> 219-375 - before dinner: 136-194, 334 >> 43-230 >> 61, 74-342  >> 44-219 - 2h after dinner: 69, 194, 319, 359, 87-337 >> 64, 147-188, 260 >> 141-252 - bedtime: 289-359 >> 73-202, 307 >> 48, 135-147 >> 171-313 - nighttime: between 12 am-4 am: 42, 53-150, 290 >> <40-128, 178 >> <40 x1, 49-241 >>  52-331  Lowest sugars <40; she has hypoglycemia awareness at 70. No previous hypoglycemia admission. She has a glucagon kit at home, but husband has Parkinson and cannot use it. Highest sugar was 300s. Only one hyperglycemia admission in the past.    Pt's meals are: - Breakfast: 1/2 PB sandwich (26 g carbs) - Lunch: 1/2 PB sandwich (26 g carbs) - Dinner: 1/2 PB sandwich (26 g carbs) - Snacks: 1- chips (16 g carbs)  - no CKD, last BUN/creatinine:  07/26/2013: 13/0.7 (GFR 85). She is on Ramipril. - last set of lipids: 187/133/58.2/102 She is not on a statin. - last eye exam was in 05/2013. No DR.  - + numbness and tingling in her feet. On Neurontin 900-500-900.  I reviewed her chart and she also has a history of Gilbert sd., anxiety, MVP, Chronic LBP, Osteopenia, HTN, episode of Afib in 07/2010, pancreatic atrophy and dilated intrahepatic ducts  - dx 2012.  Last TSH: 07/27/2013: TSH 2.095  ROS: Constitutional: no weight loss, no fatigue, no subjective hyperthermia/hypothermia Eyes: no blurry vision, no xerophthalmia ENT: no sore throat, no nodules palpated in throat, no dysphagia/odynophagia, no hoarseness Cardiovascular: no CP/SOB/ palpitations/no leg swelling Respiratory: no cough/SOB Gastrointestinal: no N/V/D/C Musculoskeletal: no muscle/joint aches Skin: no rashes Neurological: no tremors/numbness/tingling/dizziness  I reviewed pt's medications, allergies, PMH, social hx, family hx, and changes were documented in the history of present illness. Otherwise, unchanged from my initial visit note.  PE: BP 122/80 mmHg  Pulse 63  Temp(Src) 97.7 F (36.5 C) (Oral)  Resp 12  Wt 138 lb 9.6 oz (62.869 kg)  SpO2 99% Wt Readings from Last 3 Encounters:  02/18/14 138 lb 9.6 oz (62.869 kg)  01/07/14 138 lb (62.596 kg)  11/30/13 138 lb (62.596 kg)   Constitutional: normal weight, in NAD Eyes: PERRLA, EOMI, no exophthalmos ENT: moist mucous membranes, no thyromegaly, no  cervical lymphadenopathy Cardiovascular: RRR, No MRG Respiratory: CTA B Gastrointestinal: abdomen soft, NT, ND, BS+ Musculoskeletal: no deformities, strength intact in all 4 Skin: moist, warm, no rashes Neurological: mild tremor with outstretched hands  ASSESSMENT: 1. DM1, uncontrolled, with complications - PN - on Neurontin  PLAN:  1. Patient with long-standing, uncontrolled DM1, on insulin pump. Despite advice at last visit, she did not start changing the infusion sets every 3 days, but rather every 7-9 days. She mentions that this is because she cannot afford to change them more frequently. She also is bolusing and checking sugars infrequently, she has days with no or 1 bolus a day. As a consequence, her diabetes control is abysmal. One problem is that she might drop her sugars significantly after bolusing. - At this visit, we reviewed her pump download together, and I again advised her to check her sugars 15 minutes before every meal, and a number of carbs that she plans to eat, and bolus as advised by the bolus wizard. This will lead to at least 3 boluses a day. Reviewing her pump downloads, she is dropping her sugars after bolusing, usually for a meal. This does not happen all the time, however the drops are significant. I will, therefore, reduce her insulin to carb ratios for now. At next visit, we will also need to decrease the basal rates if she still has hypoglycemia after correction. - We discussed about changes to her insulin regimen, as follows:   Patient Instructions  Please change the settings as follows: - basal rates: 12 am: 0.30 units/h  2 am: 0.20  4 am: 0.60  7 am: 0.40 1 pm: 1 10 pm: 0.80 - ICR:  5 am: 15 >> 17 11 am: 20 >> 22 - target:  12 am: 130-130 6 am: 120-130 10 am: 130-130 - ISF: 70 - Insulin on Board: 4h  Please change the site every 3 days!!!  Please bolus with every meal and snack, at least 3x a day!!!  Please schedule an appt with [name] next  week  - continue checking sugars at different times of the day - check at least 4 times a day, rotating checks - she is UTD with eye exam - patient was again referred to see [name] (DM education) but she had to cancel the appointment. I advised her to reschedule - no signs of other autoimmune disorders - Return to clinic in 1.5 mo with sugar log   - time spent with the patient: 40 min, of which >50% was spent in reviewing labs, pump settings, reviewing her pump download, discussing the conjuncture of her low and high CBGs and proper pump management (please see the discussed topics above), and developing a plan to prevent further hypoglycemia and hyperglycemia.

## 2014-02-18 NOTE — Patient Instructions (Signed)
Please change the settings as follows: - basal rates: 12 am: 0.30 units/h  2 am: 0.20  4 am: 0.60  7 am: 0.40 1 pm: 0.9 10 pm: 0.80 - ICR:  5 am: 15 >> 17 11 am: 20 >> 22 - target:  12 am: 130-130 6 am: 120-130 10 am: 130-130 - ISF: 70 - Insulin on Board: 4h  Please change the site every 3 days!!!  Please bolus with every meal and snack, at least 3x a day!!!  Please schedule an appt with [name] next week.

## 2014-02-22 ENCOUNTER — Encounter: Payer: Medicare Other | Admitting: Nutrition

## 2014-02-23 ENCOUNTER — Telehealth: Payer: Self-pay | Admitting: Nutrition

## 2014-02-23 NOTE — Telephone Encounter (Signed)
Message left on my machine that she had to cancel her appt. Because she did not have transportation  I phoned her back and left a message to call me to reschedule the appt.

## 2014-03-08 ENCOUNTER — Other Ambulatory Visit: Payer: Self-pay | Admitting: Internal Medicine

## 2014-03-09 ENCOUNTER — Encounter: Payer: Medicare Other | Attending: Internal Medicine | Admitting: Nutrition

## 2014-03-09 DIAGNOSIS — E1042 Type 1 diabetes mellitus with diabetic polyneuropathy: Secondary | ICD-10-CM | POA: Insufficient documentation

## 2014-03-09 DIAGNOSIS — Z713 Dietary counseling and surveillance: Secondary | ICD-10-CM | POA: Diagnosis not present

## 2014-03-09 NOTE — Progress Notes (Signed)
Patient is here to review blood sugar readings.  She brought in a food diary and blood sugar log.  She reports that her frequentcy of lows and highs, are about the same, with no "rhyme or reason for any of them".  She appears to be counting carbs correctly, and is usingt the bolus wizard for all of her meals, snacks, and corrections.   Meals are balanced, except for breakfast, which is a poptart (42 carbs), and she is not drinking sweet drinks, juices or eating cold cereal and milk.  A typical day: 2-4AM: tests blood sugar and does a correction if high. 7AM:  Gets up, tests, and eats breakfast of poptart 11AM-12PM: lunch of 1/2 sandwich with 2 ounces of protein,                    And a diet drink 4-6PM: supper with 3-4 ounces of protein, and 2-3 serivngs              Of carbs, with 1 serving of fat.   8PM:  Tests blood sugar, will eat a snack of 10g.of carb,a nd 1ounce of meat if below 100, or 2 crackers with 1-2 ounces of cheese, and diet drink. 9PM: testing and bed  She also reports that stress will send her blood sugars up into the 250s-300 range.  She has never used the temp basal feature for this or sick days.  I explained this to her and showed her how to put the pump in a increased 20% rate for 12 hours.  She re demonstrated how to to do this correctly, and canceled it without any difficulty.We discussed times when she will need to both increase and decrease her basal rates, and she reported good understanding of this.    Both she and I are not sure the basal rates are set correctly,  Her night time rates are rising/falling by more that 50 points.  She will do a night time basal test tonight and call me the readings in the morning.  Discussed things that will increase her night time readings, like high fat meals, and high fat late snacks like cheese and peanut butter.  She will only eat fruit tonight-15 grams if below 100, with no IOB.    Once night time basal rates are set, we will delay  breafkast to check morning basal rates.    She had no final questions.

## 2014-03-09 NOTE — Patient Instructions (Signed)
Eat low fat supper tonight.   Test blood sugars at bedtime (4 hours after last bolus), and at 3AM, and again in the morning, and call me the results.

## 2014-03-11 ENCOUNTER — Telehealth: Payer: Self-pay | Admitting: Internal Medicine

## 2014-03-11 ENCOUNTER — Other Ambulatory Visit: Payer: Self-pay | Admitting: *Deleted

## 2014-03-11 DIAGNOSIS — N760 Acute vaginitis: Secondary | ICD-10-CM | POA: Diagnosis not present

## 2014-03-11 MED ORDER — GLUCOSE BLOOD VI STRP: ORAL_STRIP | Status: DC

## 2014-03-11 NOTE — Telephone Encounter (Signed)
Sent new rx with ICD 10 dx code.

## 2014-03-11 NOTE — Telephone Encounter (Signed)
Chapman from [facility] patient need  a ICD 11 dx code for one touch ultra test stirps.  Phone # [phone]

## 2014-03-21 DIAGNOSIS — Z1231 Encounter for screening mammogram for malignant neoplasm of breast: Secondary | ICD-10-CM | POA: Diagnosis not present

## 2014-03-24 DIAGNOSIS — M961 Postlaminectomy syndrome, not elsewhere classified: Secondary | ICD-10-CM | POA: Diagnosis not present

## 2014-03-24 DIAGNOSIS — G894 Chronic pain syndrome: Secondary | ICD-10-CM | POA: Diagnosis not present

## 2014-03-24 DIAGNOSIS — M5416 Radiculopathy, lumbar region: Secondary | ICD-10-CM | POA: Diagnosis not present

## 2014-03-24 DIAGNOSIS — Z79891 Long term (current) use of opiate analgesic: Secondary | ICD-10-CM | POA: Diagnosis not present

## 2014-03-24 DIAGNOSIS — Z79899 Other long term (current) drug therapy: Secondary | ICD-10-CM | POA: Diagnosis not present

## 2014-04-01 ENCOUNTER — Ambulatory Visit: Payer: Medicare Other | Admitting: Internal Medicine

## 2014-04-06 DIAGNOSIS — Z7901 Long term (current) use of anticoagulants: Secondary | ICD-10-CM | POA: Diagnosis not present

## 2014-04-26 DIAGNOSIS — Z7901 Long term (current) use of anticoagulants: Secondary | ICD-10-CM | POA: Diagnosis not present

## 2014-05-06 ENCOUNTER — Encounter: Payer: Self-pay | Admitting: Internal Medicine

## 2014-05-06 ENCOUNTER — Ambulatory Visit (INDEPENDENT_AMBULATORY_CARE_PROVIDER_SITE_OTHER): Payer: Medicare Other | Admitting: Internal Medicine

## 2014-05-06 VITALS — BP 122/66 | HR 82 | Temp 97.6°F | Resp 12 | Wt 133.0 lb

## 2014-05-06 DIAGNOSIS — E1042 Type 1 diabetes mellitus with diabetic polyneuropathy: Secondary | ICD-10-CM | POA: Diagnosis not present

## 2014-05-06 LAB — TSH: TSH: 0.92 u[IU]/mL (ref 0.35–4.50)

## 2014-05-06 LAB — HEMOGLOBIN A1C: Hgb A1c MFr Bld: 8 % — ABNORMAL HIGH (ref 4.6–6.5)

## 2014-05-06 NOTE — Progress Notes (Signed)
Patient ID: Colleen Payne, female   DOB: 27-Feb-1950, 64 y.o.   MRN: [id number]  HPI: Colleen Payne is a 64 y.o.-year-old female, returning for follow-up for DM1, dx 1987, uncontrolled, with complications (PN). Last visit 2.5 mo ago.  Pt started on an insulin pump in 1994:  Minimed 712 without CGM, uses Humalog in the pump. She is overwhelmed by the pump, and she does not use it at maximum capacity. She believes she has had the same pump for ~ 10 years!!!!  Last hemoglobin A1c was: 11/2013: HbA1c 8.2% 10/2013: HbA1c 8.0% 07/27/2013: HbA1c 9.4%  Pump settings - see previous visit changes in both: -- basal rates: 12 am: 0.30 units/h  2 am: 0.20  4 am: 0.60  7 am: 0.40 1 pm: 1 >> 0.9 2 pm: 1 10 pm: 0.80 - ICR:  12 am: 20 5 am: 17 11 am: 22 - target:  12 am: 130-130 6 am: 120-130 10 am: 130-130 - ISF: 70 - Insulin on Board: 4h She is changing her infusion site every 7 days as opposed to every 3 days, despite repeated advice. She mentions that she cannot afford to change them more frequently.  Her total daily  insulin dose percentages are still poor - she is not bolusing enough, she has days with 0 or 1 bolus a day:  TDD from bolus insulin: 24% (4.8 units)  TDD from basal insulin: 76% (15.6 units)  - extended bolusing: not using - changes infusion site: q7 days - Meter: One Touch ultra link  Pt checks her sugars 4 a day and they are as erratic as before (ave 174 +/- 90 >> ave 170 +/- 87 >> 170 +/- 80 now):  05/06/2014:   In comparison, 01/2014:  - am: 95-219, 344, 350 >> <40-149 >> 77-208, 299 >> 52-203 >> 48-239 - 2h after b'fast: n/c >> 47-299 >> 115-353 >> 62-378 >> [phone] - before lunch: 204-400 >> 84-207 >> 56, 100-302 >> 48-302 >> 78-315 - 2h after lunch: 179-329 >> 123-250 >> 210-368 >> 219-375 >> 46, 68-303 - before dinner: 136-194, 334 >> 43-230 >> 61, 74-342  >> 44-219 >> 46, 112-270 - 2h after dinner: 69, 194, 319, 359, 87-337 >> 64, 147-188, 260 >>  141-252 >> 66, 120-201 - bedtime: 289-359 >> 73-202, 307 >> 48, 135-147 >> 171-313 >> 116-160 - nighttime: between 12 am-4 am: 42, 53-150, 290 >> <40-128, 178 >> <40 x1, 49-241 >> 52-331 >> 150-299 Lowest sugars 40s; she has hypoglycemia awareness at 70. No previous hypoglycemia admission. She has a glucagon kit at home, but husband has Parkinson and cannot use it. Highest sugar was 400s. Only one hyperglycemia admission in the past.    Pt's meals are: - Breakfast: 1/2 PB sandwich (26 g carbs) - Lunch: 1/2 PB sandwich (26 g carbs) - Dinner: 1/2 PB sandwich (26 g carbs) - Snacks: 1- chips (16 g carbs)  - no CKD, last BUN/creatinine:  07/26/2013: 13/0.7 (GFR 85). She is on Ramipril. - last set of lipids: 187/133/58.2/102 She is not on a statin. - last eye exam was in 05/2013. No DR.  - + numbness and tingling in her feet. On Neurontin 900-500-900.  I reviewed her chart and she also has a history of Gilbert sd., anxiety, MVP, Chronic LBP, Osteopenia, HTN, episode of Afib in 07/2010, pancreatic atrophy and dilated intrahepatic ducts  - dx 2012.  Last TSH: 07/27/2013: TSH 2.095  ROS: Constitutional: + weight loss, no fatigue, no subjective hyperthermia/hypothermia Eyes: no  blurry vision, no xerophthalmia ENT: no sore throat, no nodules palpated in throat, no dysphagia/odynophagia, no hoarseness Cardiovascular: no CP/SOB/ palpitations/no leg swelling Respiratory: + cough/no SOB/+ wheezing Gastrointestinal: no N/V/D/C Musculoskeletal: no muscle/joint aches Skin: no rashes Neurological: no tremors/numbness/tingling/dizziness  I reviewed pt's medications, allergies, PMH, social hx, family hx, and changes were documented in the history of present illness. Otherwise, unchanged from my initial visit note. Since last visit, she stopped Prempro.  PE: BP 122/66 mmHg  Pulse 82  Temp(Src) 97.6 F (36.4 C) (Oral)  Resp 12  Wt 133 lb (60.328 kg)  SpO2 97% Wt Readings from Last 3  Encounters:  05/06/14 133 lb (60.328 kg)  02/18/14 138 lb 9.6 oz (62.869 kg)  01/07/14 138 lb (62.596 kg)   Constitutional: normal weight, in NAD Eyes: PERRLA, EOMI, no exophthalmos ENT: moist mucous membranes, no thyromegaly, no cervical lymphadenopathy Cardiovascular: RRR, No MRG Respiratory: CTA B Gastrointestinal: abdomen soft, NT, ND, BS+ Musculoskeletal: no deformities, strength intact in all 4 Skin: moist, warm, no rashes Neurological: mild tremor with outstretched hands  ASSESSMENT: 1. DM1, uncontrolled, with complications - PN - on Neurontin  PLAN:  1. Patient with long-standing, uncontrolled DM1, on insulin pump. Despite advice at last visit, she did not start changing the infusion sets every 3 days, but continues to change them every 7-9 days. I explained multiple times about the absolute need for her to change them every 3 days to avoid erratic hypo-or hyperglycemia beyond 3 days. I explained that I cannot make changes in her pump settings unless she starts changing the sets is recommended. She again mentions that this is because she cannot afford to change them more frequently. However, she still does not appear to understand why this is very important. I will refer her back to diabetes education, to see if Colleen Payne might people to help with her infusion sets. - Another problem is that she is not bolusing enough. She has days in which she did not bolus at all. Other days she bolused once a day. I explained that, again, until she starts bolusing before every meal, I cannot adjust her pump settings to target better control - At this visit, we reviewed her pump download together, and I again advised her to check her sugars 15 minutes before every meal, and a number of carbs that she plans to eat, and bolus as advised by the bolus wizard. She does have a glucometer that communicates with the pump, so she does not have to introduce her sugars manually. We have had already all the  discussions above at each of our visits, without results. - She complains about having lows, however, these are not consistent at the particular time of the day, and it is difficult for me to interpret them if her infusion sets have not been changed as recommended. - her pump is more than 65 years old, so she will need to change this ASAP. I advised her to discuss with [name] about getting another MiniMed pump. - We discussed about changes to her insulin regimen, as follows:   Patient Instructions  Please see [name] again for help with the pump supplies.  Please stay on the same pump supplies: -- basal rates: 12 am: 0.30 units/h  2 am: 0.20  4 am: 0.60  7 am: 0.40 1 pm: 0.9 2 pm: 1 10 pm: 0.80 - ICR:  12 am: 20 5 am: 17 11 am: 22 - target:  12 am: 130-130 6 am: 120-130 10 am:  130-130 - ISF: 70 - Insulin on Board: 4h   Please change the site every 3 days!!!  Please bolus with every meal and snack, at least 3x a day!!!  Please return in 3 months with your sugar log.   - continue checking sugars at different times of the day - check at least 4 times a day, rotating checks - she is UTD with eye exam - no signs of other autoimmune disorders - today we will check a TSH and a hemoglobin A1c - Return to clinic in 1.5 mo with sugar log   - time spent with the patient: 40 min, of which >50% was spent in reviewing labs, pump settings, reviewing her pump download, discussing the conjuncture of her low and high CBGs and proper pump management (please see the discussed topics above), and developing a plan to prevent further hypoglycemia and hyperglycemia.   Office Visit on 05/06/2014  Component Date Value Ref Range Status  . Hgb A1c MFr Bld 05/06/2014 8.0* 4.6 - 6.5 % Final   Glycemic Control Guidelines for People with Diabetes:Non Diabetic:  <6%Goal of Therapy: <7%Additional Action Suggested:  >8%   . TSH 05/06/2014 0.92  0.35 - 4.50 uIU/mL Final   TSH normal. Hemoglobin A1c a little  lower.

## 2014-05-06 NOTE — Patient Instructions (Signed)
Please see Colleen Payne again for help with the pump supplies.  Please stay on the same pump supplies: -- basal rates: 12 am: 0.30 units/h  2 am: 0.20  4 am: 0.60  7 am: 0.40 1 pm: 0.9 2 pm: 1 10 pm: 0.80 - ICR:  12 am: 20 5 am: 17 11 am: 22 - target:  12 am: 130-130 6 am: 120-130 10 am: 130-130 - ISF: 70 - Insulin on Board: 4h   Please change the site every 3 days!!!  Please bolus with every meal and snack, at least 3x a day!!!  Please return in 3 months with your sugar log.

## 2014-05-10 DIAGNOSIS — Z7901 Long term (current) use of anticoagulants: Secondary | ICD-10-CM | POA: Diagnosis not present

## 2014-05-16 ENCOUNTER — Encounter: Payer: Medicare Other | Admitting: Nutrition

## 2014-05-30 DIAGNOSIS — E1022 Type 1 diabetes mellitus with diabetic chronic kidney disease: Secondary | ICD-10-CM | POA: Diagnosis not present

## 2014-05-30 DIAGNOSIS — E780 Pure hypercholesterolemia: Secondary | ICD-10-CM | POA: Diagnosis not present

## 2014-05-30 DIAGNOSIS — I10 Essential (primary) hypertension: Secondary | ICD-10-CM | POA: Diagnosis not present

## 2014-05-30 DIAGNOSIS — I482 Chronic atrial fibrillation: Secondary | ICD-10-CM | POA: Diagnosis not present

## 2014-05-30 DIAGNOSIS — E039 Hypothyroidism, unspecified: Secondary | ICD-10-CM | POA: Insufficient documentation

## 2014-06-06 DIAGNOSIS — E1022 Type 1 diabetes mellitus with diabetic chronic kidney disease: Secondary | ICD-10-CM | POA: Diagnosis not present

## 2014-06-06 DIAGNOSIS — I10 Essential (primary) hypertension: Secondary | ICD-10-CM | POA: Diagnosis not present

## 2014-06-06 DIAGNOSIS — E780 Pure hypercholesterolemia: Secondary | ICD-10-CM | POA: Diagnosis not present

## 2014-06-06 DIAGNOSIS — I482 Chronic atrial fibrillation: Secondary | ICD-10-CM | POA: Diagnosis not present

## 2014-06-06 DIAGNOSIS — N181 Chronic kidney disease, stage 1: Secondary | ICD-10-CM | POA: Diagnosis not present

## 2014-06-17 DIAGNOSIS — I4891 Unspecified atrial fibrillation: Secondary | ICD-10-CM | POA: Diagnosis not present

## 2014-06-17 DIAGNOSIS — I10 Essential (primary) hypertension: Secondary | ICD-10-CM | POA: Diagnosis not present

## 2014-06-17 DIAGNOSIS — I480 Paroxysmal atrial fibrillation: Secondary | ICD-10-CM | POA: Diagnosis not present

## 2014-07-08 DIAGNOSIS — I482 Chronic atrial fibrillation: Secondary | ICD-10-CM | POA: Diagnosis not present

## 2014-07-19 DIAGNOSIS — G894 Chronic pain syndrome: Secondary | ICD-10-CM | POA: Diagnosis not present

## 2014-07-19 DIAGNOSIS — M961 Postlaminectomy syndrome, not elsewhere classified: Secondary | ICD-10-CM | POA: Diagnosis not present

## 2014-07-19 DIAGNOSIS — M5416 Radiculopathy, lumbar region: Secondary | ICD-10-CM | POA: Diagnosis not present

## 2014-07-19 DIAGNOSIS — Z79891 Long term (current) use of opiate analgesic: Secondary | ICD-10-CM | POA: Diagnosis not present

## 2014-07-19 DIAGNOSIS — Z79899 Other long term (current) drug therapy: Secondary | ICD-10-CM | POA: Diagnosis not present

## 2014-07-31 ENCOUNTER — Other Ambulatory Visit: Payer: Self-pay | Admitting: Internal Medicine

## 2014-08-05 ENCOUNTER — Ambulatory Visit: Payer: Medicare Other | Admitting: Internal Medicine

## 2014-08-05 DIAGNOSIS — Z0289 Encounter for other administrative examinations: Secondary | ICD-10-CM

## 2014-08-11 DIAGNOSIS — Z01419 Encounter for gynecological examination (general) (routine) without abnormal findings: Secondary | ICD-10-CM | POA: Diagnosis not present

## 2014-08-11 DIAGNOSIS — I482 Chronic atrial fibrillation: Secondary | ICD-10-CM | POA: Diagnosis not present

## 2014-08-11 DIAGNOSIS — R232 Flushing: Secondary | ICD-10-CM | POA: Diagnosis not present

## 2014-08-11 DIAGNOSIS — N951 Menopausal and female climacteric states: Secondary | ICD-10-CM | POA: Diagnosis not present

## 2014-10-11 DIAGNOSIS — Z79899 Other long term (current) drug therapy: Secondary | ICD-10-CM | POA: Diagnosis not present

## 2014-10-11 DIAGNOSIS — M961 Postlaminectomy syndrome, not elsewhere classified: Secondary | ICD-10-CM | POA: Diagnosis not present

## 2014-10-11 DIAGNOSIS — G894 Chronic pain syndrome: Secondary | ICD-10-CM | POA: Diagnosis not present

## 2014-10-11 DIAGNOSIS — M5416 Radiculopathy, lumbar region: Secondary | ICD-10-CM | POA: Diagnosis not present

## 2014-10-11 DIAGNOSIS — Z79891 Long term (current) use of opiate analgesic: Secondary | ICD-10-CM | POA: Diagnosis not present

## 2014-10-17 DIAGNOSIS — I482 Chronic atrial fibrillation: Secondary | ICD-10-CM | POA: Diagnosis not present

## 2014-10-20 ENCOUNTER — Other Ambulatory Visit: Payer: Self-pay | Admitting: Internal Medicine

## 2014-10-21 DIAGNOSIS — M2578 Osteophyte, vertebrae: Secondary | ICD-10-CM | POA: Diagnosis not present

## 2014-10-21 DIAGNOSIS — M5116 Intervertebral disc disorders with radiculopathy, lumbar region: Secondary | ICD-10-CM | POA: Diagnosis not present

## 2014-10-21 DIAGNOSIS — M4806 Spinal stenosis, lumbar region: Secondary | ICD-10-CM | POA: Diagnosis not present

## 2014-10-21 DIAGNOSIS — M4726 Other spondylosis with radiculopathy, lumbar region: Secondary | ICD-10-CM | POA: Diagnosis not present

## 2014-10-21 DIAGNOSIS — M818 Other osteoporosis without current pathological fracture: Secondary | ICD-10-CM | POA: Diagnosis not present

## 2014-10-27 DIAGNOSIS — H2512 Age-related nuclear cataract, left eye: Secondary | ICD-10-CM | POA: Diagnosis not present

## 2014-10-27 LAB — HM DIABETES EYE EXAM

## 2014-10-31 ENCOUNTER — Encounter: Payer: Self-pay | Admitting: Internal Medicine

## 2014-11-11 DIAGNOSIS — H2512 Age-related nuclear cataract, left eye: Secondary | ICD-10-CM | POA: Diagnosis not present

## 2014-11-17 ENCOUNTER — Encounter: Payer: Self-pay | Admitting: *Deleted

## 2014-11-18 DIAGNOSIS — I482 Chronic atrial fibrillation: Secondary | ICD-10-CM | POA: Diagnosis not present

## 2014-11-18 DIAGNOSIS — Z23 Encounter for immunization: Secondary | ICD-10-CM | POA: Diagnosis not present

## 2014-11-21 NOTE — Discharge Instructions (Signed)

## 2014-11-23 ENCOUNTER — Encounter
Admission: RE | Disposition: A | Discharge status: Home / Self Care | Payer: Self-pay | Source: Ambulatory Visit | Attending: Ophthalmology

## 2014-11-23 ENCOUNTER — Ambulatory Visit: Payer: Medicare Other | Admitting: Anesthesiology

## 2014-11-23 ENCOUNTER — Ambulatory Visit
Admission: RE | Admit: 2014-11-23 | Discharge: 2014-11-23 | Disposition: A | Discharge status: Home / Self Care | Payer: Medicare Other | Source: Ambulatory Visit | Attending: Ophthalmology | Admitting: Ophthalmology

## 2014-11-23 ENCOUNTER — Encounter: Payer: Self-pay | Admitting: *Deleted

## 2014-11-23 DIAGNOSIS — M549 Dorsalgia, unspecified: Secondary | ICD-10-CM | POA: Insufficient documentation

## 2014-11-23 DIAGNOSIS — Z886 Allergy status to analgesic agent status: Secondary | ICD-10-CM | POA: Insufficient documentation

## 2014-11-23 DIAGNOSIS — I10 Essential (primary) hypertension: Secondary | ICD-10-CM | POA: Insufficient documentation

## 2014-11-23 DIAGNOSIS — I341 Nonrheumatic mitral (valve) prolapse: Secondary | ICD-10-CM | POA: Diagnosis not present

## 2014-11-23 DIAGNOSIS — Z9841 Cataract extraction status, right eye: Secondary | ICD-10-CM | POA: Insufficient documentation

## 2014-11-23 DIAGNOSIS — E119 Type 2 diabetes mellitus without complications: Secondary | ICD-10-CM | POA: Diagnosis not present

## 2014-11-23 DIAGNOSIS — R002 Palpitations: Secondary | ICD-10-CM | POA: Insufficient documentation

## 2014-11-23 DIAGNOSIS — G8929 Other chronic pain: Secondary | ICD-10-CM | POA: Diagnosis not present

## 2014-11-23 DIAGNOSIS — Z91041 Radiographic dye allergy status: Secondary | ICD-10-CM | POA: Diagnosis not present

## 2014-11-23 DIAGNOSIS — R51 Headache: Secondary | ICD-10-CM | POA: Diagnosis not present

## 2014-11-23 DIAGNOSIS — F329 Major depressive disorder, single episode, unspecified: Secondary | ICD-10-CM | POA: Insufficient documentation

## 2014-11-23 DIAGNOSIS — E079 Disorder of thyroid, unspecified: Secondary | ICD-10-CM | POA: Insufficient documentation

## 2014-11-23 DIAGNOSIS — G629 Polyneuropathy, unspecified: Secondary | ICD-10-CM | POA: Insufficient documentation

## 2014-11-23 DIAGNOSIS — Z87891 Personal history of nicotine dependence: Secondary | ICD-10-CM | POA: Diagnosis not present

## 2014-11-23 DIAGNOSIS — F419 Anxiety disorder, unspecified: Secondary | ICD-10-CM | POA: Diagnosis not present

## 2014-11-23 DIAGNOSIS — H2512 Age-related nuclear cataract, left eye: Secondary | ICD-10-CM | POA: Diagnosis not present

## 2014-11-23 DIAGNOSIS — I4891 Unspecified atrial fibrillation: Secondary | ICD-10-CM | POA: Diagnosis not present

## 2014-11-23 DIAGNOSIS — F418 Other specified anxiety disorders: Secondary | ICD-10-CM | POA: Diagnosis not present

## 2014-11-23 DIAGNOSIS — K5790 Diverticulosis of intestine, part unspecified, without perforation or abscess without bleeding: Secondary | ICD-10-CM | POA: Diagnosis not present

## 2014-11-23 HISTORY — DX: Headache: R51

## 2014-11-23 HISTORY — DX: Presence of insulin pump (external) (internal): Z96.41

## 2014-11-23 HISTORY — DX: Myoneural disorder, unspecified: G70.9

## 2014-11-23 HISTORY — DX: Motion sickness, initial encounter: T75.3XXA

## 2014-11-23 HISTORY — PX: CATARACT EXTRACTION W/PHACO: SHX586

## 2014-11-23 HISTORY — DX: Headache, unspecified: R51.9

## 2014-11-23 LAB — GLUCOSE, CAPILLARY
GLUCOSE-CAPILLARY: 112 mg/dL — AB (ref 65–99)
Glucose-Capillary: 114 mg/dL — ABNORMAL HIGH (ref 65–99)

## 2014-11-23 SURGERY — PHACOEMULSIFICATION, CATARACT, WITH IOL INSERTION
Anesthesia: Monitor Anesthesia Care | Laterality: Left | Wound class: Clean

## 2014-11-23 MED ORDER — ARMC OPHTHALMIC DILATING GEL
1.0000 "application " | OPHTHALMIC | Status: DC | PRN
  Administered 2014-11-23 (×2): 1 via OPHTHALMIC

## 2014-11-23 MED ORDER — BSS IO SOLN
INTRAOCULAR | Status: DC | PRN
  Administered 2014-11-23: .2 mL via OPHTHALMIC

## 2014-11-23 MED ORDER — NA HYALUR & NA CHOND-NA HYALUR 0.4-0.35 ML IO KIT
PACK | INTRAOCULAR | Status: DC | PRN
  Administered 2014-11-23: 1 mL via INTRAOCULAR

## 2014-11-23 MED ORDER — TIMOLOL MALEATE 0.5 % OP SOLN
OPHTHALMIC | Status: DC | PRN
  Administered 2014-11-23: 1 [drp] via OPHTHALMIC

## 2014-11-23 MED ORDER — FENTANYL CITRATE (PF) 100 MCG/2ML IJ SOLN
INTRAMUSCULAR | Status: DC | PRN
  Administered 2014-11-23: 50 ug via INTRAVENOUS

## 2014-11-23 MED ORDER — MOXIFLOXACIN HCL 0.5 % OP SOLN: OPHTHALMIC | Status: DC | PRN

## 2014-11-23 MED ORDER — TETRACAINE HCL 0.5 % OP SOLN
1.0000 [drp] | OPHTHALMIC | Status: DC | PRN
  Administered 2014-11-23: 1 [drp] via OPHTHALMIC

## 2014-11-23 MED ORDER — POVIDONE-IODINE 5 % OP SOLN
1.0000 "application " | OPHTHALMIC | Status: DC | PRN
  Administered 2014-11-23: 1 via OPHTHALMIC

## 2014-11-23 MED ORDER — MIDAZOLAM HCL 2 MG/2ML IJ SOLN
INTRAMUSCULAR | Status: DC | PRN
  Administered 2014-11-23: 2 mg via INTRAVENOUS

## 2014-11-23 MED ORDER — BRIMONIDINE TARTRATE 0.2 % OP SOLN
OPHTHALMIC | Status: DC | PRN
  Administered 2014-11-23: 1 [drp] via OPHTHALMIC

## 2014-11-23 MED ORDER — EPINEPHRINE HCL 1 MG/ML IJ SOLN
INTRAOCULAR | Status: DC | PRN
  Administered 2014-11-23: 58 mL via OPHTHALMIC

## 2014-11-23 SURGICAL SUPPLY — 26 items
CANNULA ANT/CHMB 27GA (MISCELLANEOUS) ×2 IMPLANT
GLOVE SURG LX 7.5 STRW (GLOVE) ×1
GLOVE SURG LX STRL 7.5 STRW (GLOVE) ×1 IMPLANT
GLOVE SURG TRIUMPH 8.0 PF LTX (GLOVE) ×2 IMPLANT
GOWN STRL REUS W/ TWL LRG LVL3 (GOWN DISPOSABLE) ×2 IMPLANT
GOWN STRL REUS W/TWL LRG LVL3 (GOWN DISPOSABLE) ×2
LENS IOL TECNIS 24.0 (Intraocular Lens) ×2 IMPLANT
LENS IOL TECNIS MONO 1P 24.0 (Intraocular Lens) ×1 IMPLANT
MARKER SKIN SURG W/RULER VIO (MISCELLANEOUS) ×2 IMPLANT
NDL RETROBULBAR .5 NSTRL (NEEDLE) IMPLANT
NEEDLE FILTER BLUNT 18X 1/2SAF (NEEDLE) ×1
NEEDLE FILTER BLUNT 18X1 1/2 (NEEDLE) ×1 IMPLANT
PACK CATARACT BRASINGTON (MISCELLANEOUS) ×2 IMPLANT
PACK EYE AFTER SURG (MISCELLANEOUS) ×2 IMPLANT
PACK OPTHALMIC (MISCELLANEOUS) ×2 IMPLANT
RING MALYGIN 7.0 (MISCELLANEOUS) IMPLANT
SUT ETHILON 10-0 CS-B-6CS-B-6 (SUTURE)
SUT VICRYL  9 0 (SUTURE)
SUT VICRYL 9 0 (SUTURE) IMPLANT
SUTURE EHLN 10-0 CS-B-6CS-B-6 (SUTURE) IMPLANT
SYR 3ML LL SCALE MARK (SYRINGE) ×2 IMPLANT
SYR 5ML LL (SYRINGE) IMPLANT
SYR TB 1ML LUER SLIP (SYRINGE) ×2 IMPLANT
WATER STERILE IRR 250ML POUR (IV SOLUTION) ×2 IMPLANT
WATER STERILE IRR 500ML POUR (IV SOLUTION) IMPLANT
WIPE NON LINTING 3.25X3.25 (MISCELLANEOUS) ×2 IMPLANT

## 2014-11-23 NOTE — H&P (Signed)
  The History and Physical notes are on paper, have been signed, and are to be scanned. The patient remains stable and unchanged from the H&P.   Previous H&P reviewed, patient examined, and there are no changes.  Colleen Payne 11/23/2014 8:04 AM

## 2014-11-23 NOTE — Op Note (Signed)
OPERATIVE NOTE  Colleen Payne [id number] 11/23/2014   PREOPERATIVE DIAGNOSIS:  Nuclear sclerotic cataract left eye. H25.12   POSTOPERATIVE DIAGNOSIS:    Nuclear sclerotic cataract left eye.     PROCEDURE:  Phacoemusification with posterior chamber intraocular lens placement of the left eye   LENS:   Implant Name Type Inv. Item Serial No. Manufacturer Lot No. LRB No. Used  LENS IMPL INTRAOC [id number] 24.0 - [id number] Intraocular Lens LENS IMPL INTRAOC ZCB00 24.0 [id number] AMO   Left 1        ULTRASOUND TIME: 18.6  % of 1 minutes 9 seconds, CDE 12.8  SURGEON:  Wyonia Hough, MD   ANESTHESIA:  Topical with tetracaine drops and 2% Xylocaine jelly.   COMPLICATIONS:  None.   DESCRIPTION OF PROCEDURE:  The patient was identified in the holding room and transported to the operating room and placed in the supine position under the operating microscope.  The left eye was identified as the operative eye and it was prepped and draped in the usual sterile ophthalmic fashion.   A 1 millimeter clear-corneal paracentesis was made at the 1:30 position.  The anterior chamber was filled with Viscoat viscoelastic.  A 2.4 millimeter keratome was used to make a near-clear corneal incision at the 10:30 position.  .  A curvilinear capsulorrhexis was made with a cystotome and capsulorrhexis forceps.  Balanced salt solution was used to hydrodissect and hydrodelineate the nucleus.   Phacoemulsification was then used in stop and chop fashion to remove the lens nucleus and epinucleus.  The remaining cortex was then removed using the irrigation and aspiration handpiece. Provisc was then placed into the capsular bag to distend it for lens placement.  A lens was then injected into the capsular bag.  The remaining viscoelastic was aspirated.   Wounds were hydrated with balanced salt solution.  The anterior chamber was inflated to a physiologic pressure with balanced salt solution.  No wound leaks were noted.  Vigamox 0.2 ml of a 1mg  per ml solution was injected into the anterior chamber for a dose of 0.2 mg of intracameral antibiotic at the completion of the case.   Timolol and Brimonidine drops were applied to the eye.  The patient was taken to the recovery room in stable condition without complications of anesthesia or surgery.  [name] 11/23/2014, 8:33 AM

## 2014-11-23 NOTE — Anesthesia Preprocedure Evaluation (Addendum)
Anesthesia Evaluation  Patient identified by MRN, date of birth, ID band Patient awake    Reviewed: Allergy & Precautions, NPO status , Patient's Chart, lab work & pertinent test results, reviewed documented beta blocker date and time   Airway Mallampati: II  TM Distance: >3 FB Neck ROM: Full    Dental   Pulmonary former smoker,    Pulmonary exam normal        Cardiovascular hypertension, Normal cardiovascular exam+ dysrhythmias Atrial Fibrillation + Valvular Problems/Murmurs MVP      Neuro/Psych  Headaches, PSYCHIATRIC DISORDERS Anxiety Depression  Neuromuscular disease    GI/Hepatic negative GI ROS, Neg liver ROS,   Endo/Other  diabetes, Poorly Controlled, Type 1, Insulin DependentHypothyroidism   Renal/GU   negative genitourinary   Musculoskeletal negative musculoskeletal ROS (+)   Abdominal   Peds negative pediatric ROS (+)  Hematology negative hematology ROS (+)   Anesthesia Other Findings   Reproductive/Obstetrics negative OB ROS                           Anesthesia Physical Anesthesia Plan  ASA: III  Anesthesia Plan: MAC   Post-op Pain Management:    Induction: Intravenous  Airway Management Planned:   Additional Equipment:   Intra-op Plan:   Post-operative Plan:   Informed Consent: I have reviewed the patients History and Physical, chart, labs and discussed the procedure including the risks, benefits and alternatives for the proposed anesthesia with the patient or authorized representative who has indicated his/her understanding and acceptance.     Plan Discussed with:   Anesthesia Plan Comments:         Anesthesia Quick Evaluation

## 2014-11-23 NOTE — Anesthesia Postprocedure Evaluation (Signed)
  Anesthesia Post-op Note  Patient: Colleen Payne  Procedure(s) Performed: Procedure(s) with comments: CATARACT EXTRACTION PHACO AND INTRAOCULAR LENS PLACEMENT (IOC) (Left) - DIABETIC - insulin pump  Anesthesia type:MAC  Patient location: PACU  Post pain: Pain level controlled  Post assessment: Post-op Vital signs reviewed, Patient's Cardiovascular Status Stable, Respiratory Function Stable, Patent Airway and No signs of Nausea or vomiting  Post vital signs: Reviewed and stable  Last Vitals:  Filed Vitals:   11/23/14 0842  BP: 130/69  Pulse: 58  Temp:   Resp: 14    Level of consciousness: awake, alert  and patient cooperative  Complications: No apparent anesthesia complications

## 2014-11-23 NOTE — Anesthesia Procedure Notes (Signed)
Procedure Name: MAC Performed by: Teara Duerksen Pre-anesthesia Checklist: Patient identified, Emergency Drugs available, Suction available, Timeout performed and Patient being monitored Patient Re-evaluated:Patient Re-evaluated prior to inductionOxygen Delivery Method: Nasal cannula Placement Confirmation: positive ETCO2       

## 2014-11-23 NOTE — Transfer of Care (Signed)
Immediate Anesthesia Transfer of Care Note  Patient: Colleen Payne  Procedure(s) Performed: Procedure(s) with comments: CATARACT EXTRACTION PHACO AND INTRAOCULAR LENS PLACEMENT (IOC) (Left) - DIABETIC - insulin pump  Patient Location: PACU  Anesthesia Type: MAC  Level of Consciousness: awake, alert  and patient cooperative  Airway and Oxygen Therapy: Patient Spontanous Breathing and Patient connected to supplemental oxygen  Post-op Assessment: Post-op Vital signs reviewed, Patient's Cardiovascular Status Stable, Respiratory Function Stable, Patent Airway and No signs of Nausea or vomiting  Post-op Vital Signs: Reviewed and stable  Complications: No apparent anesthesia complications

## 2014-11-24 ENCOUNTER — Encounter: Payer: Self-pay | Admitting: Ophthalmology

## 2014-12-02 DIAGNOSIS — E1022 Type 1 diabetes mellitus with diabetic chronic kidney disease: Secondary | ICD-10-CM | POA: Diagnosis not present

## 2014-12-02 DIAGNOSIS — E7800 Pure hypercholesterolemia, unspecified: Secondary | ICD-10-CM | POA: Diagnosis not present

## 2014-12-02 DIAGNOSIS — F325 Major depressive disorder, single episode, in full remission: Secondary | ICD-10-CM | POA: Diagnosis not present

## 2014-12-02 DIAGNOSIS — I480 Paroxysmal atrial fibrillation: Secondary | ICD-10-CM | POA: Diagnosis not present

## 2014-12-02 DIAGNOSIS — N181 Chronic kidney disease, stage 1: Secondary | ICD-10-CM | POA: Diagnosis not present

## 2014-12-02 DIAGNOSIS — E039 Hypothyroidism, unspecified: Secondary | ICD-10-CM | POA: Diagnosis not present

## 2014-12-02 DIAGNOSIS — I10 Essential (primary) hypertension: Secondary | ICD-10-CM | POA: Diagnosis not present

## 2014-12-02 DIAGNOSIS — R51 Headache: Secondary | ICD-10-CM | POA: Diagnosis not present

## 2014-12-19 DIAGNOSIS — R05 Cough: Secondary | ICD-10-CM | POA: Diagnosis not present

## 2014-12-23 DIAGNOSIS — R51 Headache: Secondary | ICD-10-CM | POA: Diagnosis not present

## 2014-12-23 DIAGNOSIS — I482 Chronic atrial fibrillation: Secondary | ICD-10-CM | POA: Diagnosis not present

## 2014-12-26 DIAGNOSIS — I482 Chronic atrial fibrillation: Secondary | ICD-10-CM | POA: Diagnosis not present

## 2014-12-27 DIAGNOSIS — I4891 Unspecified atrial fibrillation: Secondary | ICD-10-CM | POA: Diagnosis not present

## 2014-12-27 DIAGNOSIS — I10 Essential (primary) hypertension: Secondary | ICD-10-CM | POA: Diagnosis not present

## 2014-12-27 DIAGNOSIS — E7800 Pure hypercholesterolemia, unspecified: Secondary | ICD-10-CM | POA: Diagnosis not present

## 2014-12-29 DIAGNOSIS — I482 Chronic atrial fibrillation: Secondary | ICD-10-CM | POA: Diagnosis not present

## 2014-12-30 ENCOUNTER — Encounter: Payer: Self-pay | Admitting: Internal Medicine

## 2014-12-30 ENCOUNTER — Other Ambulatory Visit (INDEPENDENT_AMBULATORY_CARE_PROVIDER_SITE_OTHER): Payer: Medicare Other | Admitting: *Deleted

## 2014-12-30 ENCOUNTER — Ambulatory Visit (INDEPENDENT_AMBULATORY_CARE_PROVIDER_SITE_OTHER): Payer: Medicare Other | Admitting: Internal Medicine

## 2014-12-30 VITALS — BP 112/72 | HR 61 | Temp 97.9°F | Resp 12 | Wt 126.6 lb

## 2014-12-30 DIAGNOSIS — E1042 Type 1 diabetes mellitus with diabetic polyneuropathy: Secondary | ICD-10-CM

## 2014-12-30 LAB — POCT GLYCOSYLATED HEMOGLOBIN (HGB A1C): HEMOGLOBIN A1C: 8.5

## 2014-12-30 NOTE — Patient Instructions (Addendum)
Please talk to [employer] to send me a refill request for pump supplies.  Please schedule an appointment with [name] >> you need to change the infusion site every 3 days; also, you need a new pump.  Please return in 1.5 months.  Please continue the same pump settings for now.

## 2014-12-30 NOTE — Progress Notes (Signed)
Patient ID: Colleen Payne, female   DOB: 04/08/1950, 64 y.o.   MRN: [id number]  HPI: Colleen Payne is a 64 y.o.-year-old female, returning for follow-up for DM1, dx 1987, uncontrolled, with complications (PN). Last visit 6 mo ago.  She had pneumonia recently. Her husband is sick and she is the caregiver. She is overwhelmed and stressed out.  Pt started on an insulin pump in 1994: Minimed 712 without CGM, uses Humalog in the pump. She is overwhelmed by the pump, and she does not use it at maximum capacity. She believes she has had the same pump for ~ 10 years!!!! She did not discuss with the diabetes educator about getting a new pump, as I advised her multiple times.  Last hemoglobin A1c was: Lab Results  Component Value Date   HGBA1C 8.0* 05/06/2014  11/2013: HbA1c 8.2% 10/2013: HbA1c 8.0% 07/27/2013: HbA1c 9.4%  Pump settings: -- basal rates: 12 am: 0.30 units/h  2 am: 0.20  4 am: 0.60  7 am: 0.40 1 pm: 0.9 2 pm: 1 10 pm: 0.80 - ICR:  12 am: 20 5 am: 17 11 am: 22 - target:  12 am: 130-130 6 am: 120-130 10 am: 130-130 - ISF: 70 - Insulin on Board: 4h She is changing her infusion site every 10 days now as opposed to every 3 days, despite repeated advice. She mentions that she cannot afford to change them more frequently. She did not discuss with DM educator how to get help with this issue.  Her total daily  insulin dose percentages are still poor - she is bolusing less then before , she has days with 0 or 1 bolus a day:  TDD from bolus insulin: 24% >> 18% (3.3 units) (rarely bolusing more than 1x a day) TDD from basal insulin: 76% >> 82% (15.7 units)  - extended bolusing: not using - changes infusion site: q7 days >> now q10 days - Meter: One Touch ultra link  Pt checks her sugars 1-3x a day and they are terrible (ave 174 +/- 90 >> ave 170 +/- 87 >> 170 +/- 80 >> 235 +/- 128 now):  Previously:   - am: 95-219, 344, 350 >> <40-149 >> 77-208, 299 >> 52-203 >> 48-239  >> 50-120, 238 - 2h after b'fast: n/c >> 47-299 >> 115-353 >> 62-378 >> [phone] >> 133 - before lunch: 204-400 >> 84-207 >> 56, 100-302 >> 48-302 >> 78-315 >> n/c - 2h after lunch: 179-329 >> 123-250 >> 210-368 >> 219-375 >> 46, 68-303 >> 360->400 - before dinner: 136-194, 334 >> 43-230 >> 61, 74-342  >> 44-219 >> 46, 112-270 >> 377, >400 - 2h after dinner: 69, 194, 319, 359, 87-337 >> 64, 147-188, 260 >> 141-252 >> 66, 120-201 >> n/c - bedtime: 289-359 >> 73-202, 307 >> 48, 135-147 >> 171-313 >> 116-160 >> 142-377, >400 - nighttime: between 12 am-4 am: 42, 53-150, 290 >> <40-128, 178 >> <40 x1, 49-241 >> 52-331 >> 150-299 Lowest sugars 40s; she has hypoglycemia awareness at 70. No previous hypoglycemia admission. She has a glucagon kit at home, but husband has Parkinson and cannot use it. Highest sugar was 400s. Only one hyperglycemia admission in the past.    Pt's meals are: - Breakfast: 1/2 PB sandwich (26 g carbs) - Lunch: 1/2 PB sandwich (26 g carbs) - Dinner: 1/2 PB sandwich (26 g carbs) - Snacks: 1- chips (16 g carbs)  - no CKD, last BUN/creatinine:  10/21/2014: Creatinine 0.8 06/06/2014: 13/0.8 (GFR 72) 07/26/2013:  13/0.7 (GFR 85). She is on Ramipril. - last set of lipids: 06/06/2014: 201/89/69/114 07/26/2013: 187/133/58.2/102 She is not on a statin. - last eye exam was in 10/2014. No DR.  - + numbness and tingling in her feet. On Neurontin 900-500-900.  I reviewed her chart and she also has a history of Gilbert sd., anxiety, MVP, Chronic LBP, Osteopenia, HTN, episode of Afib in 07/2010, pancreatic atrophy and dilated intrahepatic ducts  - dx 2012.  Last TSH: Lab Results  Component Value Date   TSH 0.92 05/06/2014  07/27/2013: TSH 2.095  ROS: Constitutional: + weight loss, no fatigue, no subjective hyperthermia/hypothermia Eyes: no blurry vision, no xerophthalmia ENT: no sore throat, no nodules palpated in throat, no dysphagia/odynophagia, no  hoarseness Cardiovascular: no CP/SOB/ palpitations/no leg swelling Respiratory: + cough/no SOB/+ wheezing Gastrointestinal: no N/V/D/C Musculoskeletal: no muscle/joint aches Skin: no rashes Neurological: no tremors/numbness/tingling/dizziness  I reviewed pt's medications, allergies, PMH, social hx, family hx, and changes were documented in the history of present illness. Otherwise, unchanged from my initial visit note. Since last visit, she stopped Prempro.  PE: BP 112/72 mmHg  Pulse 61  Temp(Src) 97.9 F (36.6 C) (Oral)  Resp 12  Wt 126 lb 9.6 oz (57.425 kg)  SpO2 98% Body mass index is 21.72 kg/(m^2). Wt Readings from Last 3 Encounters:  12/30/14 126 lb 9.6 oz (57.425 kg)  11/23/14 129 lb (58.514 kg)  05/06/14 133 lb (60.328 kg)   Constitutional: normal weight, in NAD Eyes: PERRLA, EOMI, no exophthalmos ENT: moist mucous membranes, no thyromegaly, no cervical lymphadenopathy Cardiovascular: RRR, No MRG Respiratory: CTA B Gastrointestinal: abdomen soft, NT, ND, BS+ Musculoskeletal: no deformities, strength intact in all 4 Skin: moist, warm, no rashes Neurological: mild tremor with outstretched hands  ASSESSMENT: 1. DM1, uncontrolled, with complications - PN - on Neurontin  PLAN:  1. Patient with long-standing, uncontrolled DM1, on insulin pump. She returns after 6 months absence. Her sugars are much higher now compared to before. She again did not follow my advice regarding: - changing the infusion sets every 3 days, and even increased the interval between changing the site to 10 days!. I explained multiple times about the absolute need for her to change them every 3 days to avoid erratic hypo-or hyperglycemia beyond 3 days. I also explained multiple times that I cannot make changes in her pump settings unless she starts changing the sets is recommended. She again mentions that this is because she cannot afford to change them more frequently. However, she still does not appear  to understand why this is very important and did not schedule an appointment with the diabetes educator, [name], to get help with these... - Another problem is that she is not bolusing enough. She has days in which she did not bolus at all. Other days she bolused once a day. Again, there is no way we can obtain good control without her bolusing. At this visit, she actually boluses even less compared to before... - At this visit, we reviewed her pump download together, and I again advised her to check her sugars 15 minutes before every meal, and a number of carbs that she plans to eat, and bolus as advised by the bolus wizard. She does have a glucometer that communicates with the pump, so she does not have to introduce her sugars manually. She is still not introducing the carbs in the pump or checking sugars before meals. - her pump is more than 65 years old, so she will need  to change this ASAP. I advised her multiple times to discuss with [name] about getting another MiniMed pump. She did not do this...  At this point, since patient is not following any of my instructions, we discussed about the fact that I cannot help her and it does not make sense to continue to come see me. She has many excuses about why she is not following the above suggestions, but I explained that, by doing this, she is sabotaging her health. I will give her another chance to see the diabetes educator and then come back in a month and a half after she starts following my instructions. If she is not doing so by next visit, I would not be able to see the patient anymore.  Patient Instructions  Please talk to [employer] to send me a refill request for pump supplies.  Please schedule an appointment with [name] >> you need to change the infusion site every 3 days; also, you need a new pump.  Please return in 1.5 months.  Please continue the same pump settings for now.  - continue checking sugars at different times of the day -  check at least 4 times a day, rotating checks - she is UTD with eye exam - no signs of other autoimmune disorders - today we will check a hemoglobin A1c >> 8.5% (higher) - Return to clinic in 1.5 mo with sugar log   - time spent with the patient: 40 min, of which >50% was spent in reviewing labs, pump settings, reviewing her pump download, discussing the conjuncture of her low and high CBGs and proper pump management (please see the discussed topics above), and developing a plan to prevent further hypoglycemia and hyperglycemia.

## 2015-01-03 DIAGNOSIS — I482 Chronic atrial fibrillation: Secondary | ICD-10-CM | POA: Diagnosis not present

## 2015-01-10 DIAGNOSIS — I482 Chronic atrial fibrillation: Secondary | ICD-10-CM | POA: Diagnosis not present

## 2015-01-13 DIAGNOSIS — Z79891 Long term (current) use of opiate analgesic: Secondary | ICD-10-CM | POA: Diagnosis not present

## 2015-01-13 DIAGNOSIS — R29898 Other symptoms and signs involving the musculoskeletal system: Secondary | ICD-10-CM | POA: Diagnosis not present

## 2015-01-13 DIAGNOSIS — L659 Nonscarring hair loss, unspecified: Secondary | ICD-10-CM | POA: Diagnosis not present

## 2015-01-13 DIAGNOSIS — R531 Weakness: Secondary | ICD-10-CM | POA: Diagnosis not present

## 2015-01-13 DIAGNOSIS — M961 Postlaminectomy syndrome, not elsewhere classified: Secondary | ICD-10-CM | POA: Diagnosis not present

## 2015-01-13 DIAGNOSIS — R002 Palpitations: Secondary | ICD-10-CM | POA: Diagnosis not present

## 2015-01-13 DIAGNOSIS — Z886 Allergy status to analgesic agent status: Secondary | ICD-10-CM | POA: Diagnosis not present

## 2015-01-13 DIAGNOSIS — Z79899 Other long term (current) drug therapy: Secondary | ICD-10-CM | POA: Diagnosis not present

## 2015-01-13 DIAGNOSIS — Z6379 Other stressful life events affecting family and household: Secondary | ICD-10-CM | POA: Diagnosis not present

## 2015-01-13 DIAGNOSIS — Z7901 Long term (current) use of anticoagulants: Secondary | ICD-10-CM | POA: Diagnosis not present

## 2015-01-13 DIAGNOSIS — R5383 Other fatigue: Secondary | ICD-10-CM | POA: Diagnosis not present

## 2015-01-13 DIAGNOSIS — G894 Chronic pain syndrome: Secondary | ICD-10-CM | POA: Diagnosis not present

## 2015-01-13 DIAGNOSIS — Z9641 Presence of insulin pump (external) (internal): Secondary | ICD-10-CM | POA: Diagnosis not present

## 2015-01-13 DIAGNOSIS — M79604 Pain in right leg: Secondary | ICD-10-CM | POA: Diagnosis not present

## 2015-01-13 DIAGNOSIS — M5416 Radiculopathy, lumbar region: Secondary | ICD-10-CM | POA: Diagnosis not present

## 2015-01-13 DIAGNOSIS — I4891 Unspecified atrial fibrillation: Secondary | ICD-10-CM | POA: Diagnosis not present

## 2015-01-13 DIAGNOSIS — G479 Sleep disorder, unspecified: Secondary | ICD-10-CM | POA: Diagnosis not present

## 2015-01-13 DIAGNOSIS — Z0289 Encounter for other administrative examinations: Secondary | ICD-10-CM | POA: Insufficient documentation

## 2015-01-13 DIAGNOSIS — E1042 Type 1 diabetes mellitus with diabetic polyneuropathy: Secondary | ICD-10-CM | POA: Diagnosis not present

## 2015-01-13 DIAGNOSIS — Z885 Allergy status to narcotic agent status: Secondary | ICD-10-CM | POA: Diagnosis not present

## 2015-01-13 DIAGNOSIS — Z794 Long term (current) use of insulin: Secondary | ICD-10-CM | POA: Diagnosis not present

## 2015-01-13 DIAGNOSIS — M791 Myalgia: Secondary | ICD-10-CM | POA: Diagnosis not present

## 2015-01-13 DIAGNOSIS — Z91041 Radiographic dye allergy status: Secondary | ICD-10-CM | POA: Diagnosis not present

## 2015-01-13 DIAGNOSIS — M4806 Spinal stenosis, lumbar region: Secondary | ICD-10-CM | POA: Diagnosis not present

## 2015-02-22 DIAGNOSIS — Z4681 Encounter for fitting and adjustment of insulin pump: Secondary | ICD-10-CM | POA: Diagnosis not present

## 2015-02-22 DIAGNOSIS — E1029 Type 1 diabetes mellitus with other diabetic kidney complication: Secondary | ICD-10-CM | POA: Diagnosis not present

## 2015-02-22 DIAGNOSIS — R809 Proteinuria, unspecified: Secondary | ICD-10-CM | POA: Diagnosis not present

## 2015-02-22 DIAGNOSIS — Z79899 Other long term (current) drug therapy: Secondary | ICD-10-CM | POA: Diagnosis not present

## 2015-02-22 DIAGNOSIS — I482 Chronic atrial fibrillation: Secondary | ICD-10-CM | POA: Diagnosis not present

## 2015-02-27 ENCOUNTER — Ambulatory Visit: Payer: Medicare Other | Admitting: Internal Medicine

## 2015-03-16 DIAGNOSIS — E1029 Type 1 diabetes mellitus with other diabetic kidney complication: Secondary | ICD-10-CM | POA: Diagnosis not present

## 2015-03-16 DIAGNOSIS — Z79899 Other long term (current) drug therapy: Secondary | ICD-10-CM | POA: Diagnosis not present

## 2015-03-16 DIAGNOSIS — R809 Proteinuria, unspecified: Secondary | ICD-10-CM | POA: Diagnosis not present

## 2015-03-16 DIAGNOSIS — Z4681 Encounter for fitting and adjustment of insulin pump: Secondary | ICD-10-CM | POA: Diagnosis not present

## 2015-04-14 DIAGNOSIS — M5416 Radiculopathy, lumbar region: Secondary | ICD-10-CM | POA: Diagnosis not present

## 2015-04-14 DIAGNOSIS — G894 Chronic pain syndrome: Secondary | ICD-10-CM | POA: Diagnosis not present

## 2015-04-14 DIAGNOSIS — Z79891 Long term (current) use of opiate analgesic: Secondary | ICD-10-CM | POA: Diagnosis not present

## 2015-04-14 DIAGNOSIS — M961 Postlaminectomy syndrome, not elsewhere classified: Secondary | ICD-10-CM | POA: Diagnosis not present

## 2015-04-17 ENCOUNTER — Other Ambulatory Visit: Payer: Self-pay | Admitting: Internal Medicine

## 2015-04-24 DIAGNOSIS — R809 Proteinuria, unspecified: Secondary | ICD-10-CM | POA: Diagnosis not present

## 2015-04-24 DIAGNOSIS — E1029 Type 1 diabetes mellitus with other diabetic kidney complication: Secondary | ICD-10-CM | POA: Diagnosis not present

## 2015-05-01 DIAGNOSIS — E162 Hypoglycemia, unspecified: Secondary | ICD-10-CM | POA: Diagnosis not present

## 2015-05-01 DIAGNOSIS — Z4681 Encounter for fitting and adjustment of insulin pump: Secondary | ICD-10-CM | POA: Diagnosis not present

## 2015-05-01 DIAGNOSIS — Z79899 Other long term (current) drug therapy: Secondary | ICD-10-CM | POA: Diagnosis not present

## 2015-05-01 DIAGNOSIS — E1029 Type 1 diabetes mellitus with other diabetic kidney complication: Secondary | ICD-10-CM | POA: Diagnosis not present

## 2015-05-01 DIAGNOSIS — R809 Proteinuria, unspecified: Secondary | ICD-10-CM | POA: Diagnosis not present

## 2015-05-18 ENCOUNTER — Other Ambulatory Visit: Payer: Self-pay | Admitting: Internal Medicine

## 2015-05-19 ENCOUNTER — Telehealth: Payer: Self-pay | Admitting: Internal Medicine

## 2015-05-19 ENCOUNTER — Other Ambulatory Visit: Payer: Self-pay | Admitting: *Deleted

## 2015-05-19 MED ORDER — INSULIN ASPART 100 UNIT/ML ~~LOC~~ SOLN: SUBCUTANEOUS | Status: DC

## 2015-05-19 NOTE — Telephone Encounter (Signed)
Colleen Payne from [facility] has a question about E Prescription that was sent, please advise [id number] Colleen Payne, [place] [phone] (Phone) [phone] (Fax)

## 2015-05-19 NOTE — Telephone Encounter (Signed)
Returned call to pharmacy. Ins no longer covers Humalog. Ok to switch to [employer]. Done.

## 2015-06-12 DIAGNOSIS — R809 Proteinuria, unspecified: Secondary | ICD-10-CM | POA: Diagnosis not present

## 2015-06-12 DIAGNOSIS — E1029 Type 1 diabetes mellitus with other diabetic kidney complication: Secondary | ICD-10-CM | POA: Diagnosis not present

## 2015-07-20 DIAGNOSIS — E119 Type 2 diabetes mellitus without complications: Secondary | ICD-10-CM | POA: Diagnosis not present

## 2015-07-21 DIAGNOSIS — M5416 Radiculopathy, lumbar region: Secondary | ICD-10-CM | POA: Diagnosis not present

## 2015-07-21 DIAGNOSIS — M961 Postlaminectomy syndrome, not elsewhere classified: Secondary | ICD-10-CM | POA: Diagnosis not present

## 2015-07-21 DIAGNOSIS — Z79891 Long term (current) use of opiate analgesic: Secondary | ICD-10-CM | POA: Diagnosis not present

## 2015-07-21 DIAGNOSIS — G894 Chronic pain syndrome: Secondary | ICD-10-CM | POA: Diagnosis not present

## 2015-07-28 DIAGNOSIS — H538 Other visual disturbances: Secondary | ICD-10-CM | POA: Diagnosis not present

## 2015-07-31 DIAGNOSIS — R809 Proteinuria, unspecified: Secondary | ICD-10-CM | POA: Diagnosis not present

## 2015-07-31 DIAGNOSIS — E1029 Type 1 diabetes mellitus with other diabetic kidney complication: Secondary | ICD-10-CM | POA: Diagnosis not present

## 2015-08-03 DIAGNOSIS — H26491 Other secondary cataract, right eye: Secondary | ICD-10-CM | POA: Diagnosis not present

## 2015-08-14 DIAGNOSIS — Z79899 Other long term (current) drug therapy: Secondary | ICD-10-CM | POA: Diagnosis not present

## 2015-08-14 DIAGNOSIS — R809 Proteinuria, unspecified: Secondary | ICD-10-CM | POA: Diagnosis not present

## 2015-08-14 DIAGNOSIS — Z4681 Encounter for fitting and adjustment of insulin pump: Secondary | ICD-10-CM | POA: Diagnosis not present

## 2015-08-14 DIAGNOSIS — Z7901 Long term (current) use of anticoagulants: Secondary | ICD-10-CM | POA: Diagnosis not present

## 2015-08-14 DIAGNOSIS — Z5181 Encounter for therapeutic drug level monitoring: Secondary | ICD-10-CM | POA: Diagnosis not present

## 2015-08-14 DIAGNOSIS — E162 Hypoglycemia, unspecified: Secondary | ICD-10-CM | POA: Diagnosis not present

## 2015-08-14 DIAGNOSIS — E1029 Type 1 diabetes mellitus with other diabetic kidney complication: Secondary | ICD-10-CM | POA: Diagnosis not present

## 2015-08-21 DIAGNOSIS — E7800 Pure hypercholesterolemia, unspecified: Secondary | ICD-10-CM | POA: Diagnosis not present

## 2015-08-21 DIAGNOSIS — F325 Major depressive disorder, single episode, in full remission: Secondary | ICD-10-CM | POA: Diagnosis not present

## 2015-08-21 DIAGNOSIS — N182 Chronic kidney disease, stage 2 (mild): Secondary | ICD-10-CM | POA: Diagnosis not present

## 2015-08-21 DIAGNOSIS — E1022 Type 1 diabetes mellitus with diabetic chronic kidney disease: Secondary | ICD-10-CM | POA: Diagnosis not present

## 2015-08-21 DIAGNOSIS — I10 Essential (primary) hypertension: Secondary | ICD-10-CM | POA: Diagnosis not present

## 2015-08-21 DIAGNOSIS — I480 Paroxysmal atrial fibrillation: Secondary | ICD-10-CM | POA: Diagnosis not present

## 2015-08-21 DIAGNOSIS — E039 Hypothyroidism, unspecified: Secondary | ICD-10-CM | POA: Diagnosis not present

## 2015-09-13 DIAGNOSIS — N182 Chronic kidney disease, stage 2 (mild): Secondary | ICD-10-CM | POA: Diagnosis not present

## 2015-09-13 DIAGNOSIS — E1022 Type 1 diabetes mellitus with diabetic chronic kidney disease: Secondary | ICD-10-CM | POA: Diagnosis not present

## 2015-09-13 DIAGNOSIS — I480 Paroxysmal atrial fibrillation: Secondary | ICD-10-CM | POA: Diagnosis not present

## 2015-10-02 ENCOUNTER — Other Ambulatory Visit: Payer: Self-pay | Admitting: Internal Medicine

## 2015-10-02 DIAGNOSIS — Z23 Encounter for immunization: Secondary | ICD-10-CM | POA: Diagnosis not present

## 2015-10-02 DIAGNOSIS — Z7901 Long term (current) use of anticoagulants: Secondary | ICD-10-CM | POA: Diagnosis not present

## 2015-10-02 DIAGNOSIS — E1022 Type 1 diabetes mellitus with diabetic chronic kidney disease: Secondary | ICD-10-CM | POA: Diagnosis not present

## 2015-10-02 DIAGNOSIS — N182 Chronic kidney disease, stage 2 (mild): Secondary | ICD-10-CM | POA: Diagnosis not present

## 2015-10-08 ENCOUNTER — Other Ambulatory Visit: Payer: Self-pay | Admitting: Internal Medicine

## 2015-10-09 DIAGNOSIS — E1065 Type 1 diabetes mellitus with hyperglycemia: Secondary | ICD-10-CM | POA: Diagnosis not present

## 2015-10-09 DIAGNOSIS — Z4681 Encounter for fitting and adjustment of insulin pump: Secondary | ICD-10-CM | POA: Diagnosis not present

## 2015-10-09 DIAGNOSIS — R809 Proteinuria, unspecified: Secondary | ICD-10-CM | POA: Diagnosis not present

## 2015-10-09 DIAGNOSIS — E1029 Type 1 diabetes mellitus with other diabetic kidney complication: Secondary | ICD-10-CM | POA: Diagnosis not present

## 2015-10-16 DIAGNOSIS — M961 Postlaminectomy syndrome, not elsewhere classified: Secondary | ICD-10-CM | POA: Diagnosis not present

## 2015-10-16 DIAGNOSIS — M5416 Radiculopathy, lumbar region: Secondary | ICD-10-CM | POA: Diagnosis not present

## 2015-10-16 DIAGNOSIS — Z79891 Long term (current) use of opiate analgesic: Secondary | ICD-10-CM | POA: Diagnosis not present

## 2015-10-16 DIAGNOSIS — G894 Chronic pain syndrome: Secondary | ICD-10-CM | POA: Diagnosis not present

## 2015-10-22 ENCOUNTER — Other Ambulatory Visit: Payer: Self-pay | Admitting: Internal Medicine

## 2015-10-25 ENCOUNTER — Other Ambulatory Visit: Payer: Self-pay | Admitting: Internal Medicine

## 2015-10-30 DIAGNOSIS — Z7901 Long term (current) use of anticoagulants: Secondary | ICD-10-CM | POA: Diagnosis not present

## 2015-11-01 DIAGNOSIS — R937 Abnormal findings on diagnostic imaging of other parts of musculoskeletal system: Secondary | ICD-10-CM | POA: Diagnosis not present

## 2015-11-01 DIAGNOSIS — G9619 Other disorders of meninges, not elsewhere classified: Secondary | ICD-10-CM | POA: Diagnosis not present

## 2015-11-01 DIAGNOSIS — M48061 Spinal stenosis, lumbar region without neurogenic claudication: Secondary | ICD-10-CM | POA: Diagnosis not present

## 2015-11-01 DIAGNOSIS — M5416 Radiculopathy, lumbar region: Secondary | ICD-10-CM | POA: Diagnosis not present

## 2015-11-01 DIAGNOSIS — M5116 Intervertebral disc disorders with radiculopathy, lumbar region: Secondary | ICD-10-CM | POA: Diagnosis not present

## 2015-11-20 ENCOUNTER — Telehealth: Payer: Self-pay | Admitting: Internal Medicine

## 2015-11-20 DIAGNOSIS — Z1211 Encounter for screening for malignant neoplasm of colon: Secondary | ICD-10-CM | POA: Diagnosis not present

## 2015-11-20 DIAGNOSIS — Z01419 Encounter for gynecological examination (general) (routine) without abnormal findings: Secondary | ICD-10-CM | POA: Diagnosis not present

## 2015-11-20 DIAGNOSIS — M545 Low back pain: Secondary | ICD-10-CM | POA: Diagnosis not present

## 2015-11-20 DIAGNOSIS — F439 Reaction to severe stress, unspecified: Secondary | ICD-10-CM | POA: Diagnosis not present

## 2015-11-24 DIAGNOSIS — M961 Postlaminectomy syndrome, not elsewhere classified: Secondary | ICD-10-CM | POA: Diagnosis not present

## 2015-11-24 DIAGNOSIS — M5416 Radiculopathy, lumbar region: Secondary | ICD-10-CM | POA: Diagnosis not present

## 2015-11-24 DIAGNOSIS — G894 Chronic pain syndrome: Secondary | ICD-10-CM | POA: Diagnosis not present

## 2015-11-24 DIAGNOSIS — Z79891 Long term (current) use of opiate analgesic: Secondary | ICD-10-CM | POA: Diagnosis not present

## 2015-12-15 DIAGNOSIS — J189 Pneumonia, unspecified organism: Secondary | ICD-10-CM | POA: Diagnosis not present

## 2015-12-26 DIAGNOSIS — Z7901 Long term (current) use of anticoagulants: Secondary | ICD-10-CM | POA: Diagnosis not present

## 2016-01-09 DIAGNOSIS — E1065 Type 1 diabetes mellitus with hyperglycemia: Secondary | ICD-10-CM | POA: Diagnosis not present

## 2016-01-09 DIAGNOSIS — Z7901 Long term (current) use of anticoagulants: Secondary | ICD-10-CM | POA: Diagnosis not present

## 2016-01-11 DIAGNOSIS — Z7901 Long term (current) use of anticoagulants: Secondary | ICD-10-CM | POA: Diagnosis not present

## 2016-01-11 DIAGNOSIS — Z794 Long term (current) use of insulin: Secondary | ICD-10-CM | POA: Diagnosis not present

## 2016-01-11 DIAGNOSIS — M5416 Radiculopathy, lumbar region: Secondary | ICD-10-CM | POA: Diagnosis not present

## 2016-01-11 DIAGNOSIS — M961 Postlaminectomy syndrome, not elsewhere classified: Secondary | ICD-10-CM | POA: Diagnosis not present

## 2016-01-11 DIAGNOSIS — E1040 Type 1 diabetes mellitus with diabetic neuropathy, unspecified: Secondary | ICD-10-CM | POA: Diagnosis not present

## 2016-01-11 DIAGNOSIS — Z9641 Presence of insulin pump (external) (internal): Secondary | ICD-10-CM | POA: Diagnosis not present

## 2016-01-11 DIAGNOSIS — J189 Pneumonia, unspecified organism: Secondary | ICD-10-CM | POA: Diagnosis not present

## 2016-01-11 DIAGNOSIS — Z79891 Long term (current) use of opiate analgesic: Secondary | ICD-10-CM | POA: Diagnosis not present

## 2016-01-11 DIAGNOSIS — Z885 Allergy status to narcotic agent status: Secondary | ICD-10-CM | POA: Diagnosis not present

## 2016-01-11 DIAGNOSIS — I482 Chronic atrial fibrillation: Secondary | ICD-10-CM | POA: Diagnosis not present

## 2016-01-11 DIAGNOSIS — Z0289 Encounter for other administrative examinations: Secondary | ICD-10-CM | POA: Diagnosis not present

## 2016-01-11 DIAGNOSIS — G894 Chronic pain syndrome: Secondary | ICD-10-CM | POA: Diagnosis not present

## 2016-01-16 DIAGNOSIS — R05 Cough: Secondary | ICD-10-CM | POA: Diagnosis not present

## 2016-01-16 DIAGNOSIS — J040 Acute laryngitis: Secondary | ICD-10-CM | POA: Diagnosis not present

## 2016-02-21 DIAGNOSIS — N182 Chronic kidney disease, stage 2 (mild): Secondary | ICD-10-CM | POA: Diagnosis not present

## 2016-02-21 DIAGNOSIS — E1022 Type 1 diabetes mellitus with diabetic chronic kidney disease: Secondary | ICD-10-CM | POA: Diagnosis not present

## 2016-02-21 DIAGNOSIS — E7800 Pure hypercholesterolemia, unspecified: Secondary | ICD-10-CM | POA: Diagnosis not present

## 2016-02-21 DIAGNOSIS — I480 Paroxysmal atrial fibrillation: Secondary | ICD-10-CM | POA: Diagnosis not present

## 2016-02-21 DIAGNOSIS — F325 Major depressive disorder, single episode, in full remission: Secondary | ICD-10-CM | POA: Diagnosis not present

## 2016-02-21 DIAGNOSIS — I10 Essential (primary) hypertension: Secondary | ICD-10-CM | POA: Diagnosis not present

## 2016-02-21 DIAGNOSIS — E039 Hypothyroidism, unspecified: Secondary | ICD-10-CM | POA: Diagnosis not present

## 2016-02-22 DIAGNOSIS — H6123 Impacted cerumen, bilateral: Secondary | ICD-10-CM | POA: Diagnosis not present

## 2016-02-22 DIAGNOSIS — H93293 Other abnormal auditory perceptions, bilateral: Secondary | ICD-10-CM | POA: Diagnosis not present

## 2016-02-23 DIAGNOSIS — R0789 Other chest pain: Secondary | ICD-10-CM | POA: Insufficient documentation

## 2016-02-23 DIAGNOSIS — R0602 Shortness of breath: Secondary | ICD-10-CM | POA: Diagnosis not present

## 2016-02-23 DIAGNOSIS — I4891 Unspecified atrial fibrillation: Secondary | ICD-10-CM | POA: Diagnosis not present

## 2016-02-23 DIAGNOSIS — E7800 Pure hypercholesterolemia, unspecified: Secondary | ICD-10-CM | POA: Diagnosis not present

## 2016-02-23 DIAGNOSIS — I10 Essential (primary) hypertension: Secondary | ICD-10-CM | POA: Diagnosis not present

## 2016-03-04 DIAGNOSIS — Z7901 Long term (current) use of anticoagulants: Secondary | ICD-10-CM | POA: Diagnosis not present

## 2016-03-08 DIAGNOSIS — R0602 Shortness of breath: Secondary | ICD-10-CM | POA: Diagnosis not present

## 2016-03-08 DIAGNOSIS — R0789 Other chest pain: Secondary | ICD-10-CM | POA: Diagnosis not present

## 2016-03-13 DIAGNOSIS — Z4681 Encounter for fitting and adjustment of insulin pump: Secondary | ICD-10-CM | POA: Diagnosis not present

## 2016-03-13 DIAGNOSIS — E162 Hypoglycemia, unspecified: Secondary | ICD-10-CM | POA: Diagnosis not present

## 2016-03-13 DIAGNOSIS — Z79899 Other long term (current) drug therapy: Secondary | ICD-10-CM | POA: Diagnosis not present

## 2016-03-13 DIAGNOSIS — E1065 Type 1 diabetes mellitus with hyperglycemia: Secondary | ICD-10-CM | POA: Diagnosis not present

## 2016-03-13 DIAGNOSIS — Z7901 Long term (current) use of anticoagulants: Secondary | ICD-10-CM | POA: Diagnosis not present

## 2016-04-08 DIAGNOSIS — G894 Chronic pain syndrome: Secondary | ICD-10-CM | POA: Diagnosis not present

## 2016-04-08 DIAGNOSIS — M961 Postlaminectomy syndrome, not elsewhere classified: Secondary | ICD-10-CM | POA: Diagnosis not present

## 2016-04-08 DIAGNOSIS — M5416 Radiculopathy, lumbar region: Secondary | ICD-10-CM | POA: Diagnosis not present

## 2016-04-08 DIAGNOSIS — Z79891 Long term (current) use of opiate analgesic: Secondary | ICD-10-CM | POA: Diagnosis not present

## 2016-04-08 DIAGNOSIS — F4323 Adjustment disorder with mixed anxiety and depressed mood: Secondary | ICD-10-CM | POA: Diagnosis not present

## 2016-04-10 DIAGNOSIS — Z7901 Long term (current) use of anticoagulants: Secondary | ICD-10-CM | POA: Diagnosis not present

## 2016-04-25 DIAGNOSIS — R791 Abnormal coagulation profile: Secondary | ICD-10-CM | POA: Diagnosis not present

## 2016-04-25 DIAGNOSIS — E1065 Type 1 diabetes mellitus with hyperglycemia: Secondary | ICD-10-CM | POA: Diagnosis not present

## 2016-04-29 DIAGNOSIS — Z4681 Encounter for fitting and adjustment of insulin pump: Secondary | ICD-10-CM | POA: Diagnosis not present

## 2016-04-29 DIAGNOSIS — E1065 Type 1 diabetes mellitus with hyperglycemia: Secondary | ICD-10-CM | POA: Diagnosis not present

## 2016-05-06 DIAGNOSIS — Z7901 Long term (current) use of anticoagulants: Secondary | ICD-10-CM | POA: Diagnosis not present

## 2016-05-13 DIAGNOSIS — G8929 Other chronic pain: Secondary | ICD-10-CM | POA: Diagnosis not present

## 2016-05-13 DIAGNOSIS — M545 Low back pain: Secondary | ICD-10-CM | POA: Diagnosis not present

## 2016-05-14 DIAGNOSIS — R791 Abnormal coagulation profile: Secondary | ICD-10-CM | POA: Diagnosis not present

## 2016-05-22 DIAGNOSIS — R791 Abnormal coagulation profile: Secondary | ICD-10-CM | POA: Diagnosis not present

## 2016-05-30 DIAGNOSIS — M5416 Radiculopathy, lumbar region: Secondary | ICD-10-CM | POA: Diagnosis not present

## 2016-05-30 DIAGNOSIS — M961 Postlaminectomy syndrome, not elsewhere classified: Secondary | ICD-10-CM | POA: Diagnosis not present

## 2016-05-30 DIAGNOSIS — Z79891 Long term (current) use of opiate analgesic: Secondary | ICD-10-CM | POA: Diagnosis not present

## 2016-05-30 DIAGNOSIS — G894 Chronic pain syndrome: Secondary | ICD-10-CM | POA: Diagnosis not present

## 2016-06-13 ENCOUNTER — Encounter: Payer: Self-pay | Admitting: Emergency Medicine

## 2016-06-13 ENCOUNTER — Inpatient Hospital Stay
Admission: EM | Admit: 2016-06-13 | Discharge: 2016-06-15 | DRG: 638 | Disposition: A | Discharge status: Home / Self Care | Payer: Medicare Other | Attending: Internal Medicine | Admitting: Internal Medicine

## 2016-06-13 DIAGNOSIS — K8689 Other specified diseases of pancreas: Secondary | ICD-10-CM | POA: Diagnosis present

## 2016-06-13 DIAGNOSIS — I4891 Unspecified atrial fibrillation: Secondary | ICD-10-CM | POA: Diagnosis not present

## 2016-06-13 DIAGNOSIS — Z9641 Presence of insulin pump (external) (internal): Secondary | ICD-10-CM | POA: Diagnosis present

## 2016-06-13 DIAGNOSIS — Z794 Long term (current) use of insulin: Secondary | ICD-10-CM

## 2016-06-13 DIAGNOSIS — G4733 Obstructive sleep apnea (adult) (pediatric): Secondary | ICD-10-CM | POA: Diagnosis not present

## 2016-06-13 DIAGNOSIS — I10 Essential (primary) hypertension: Secondary | ICD-10-CM | POA: Diagnosis present

## 2016-06-13 DIAGNOSIS — F419 Anxiety disorder, unspecified: Secondary | ICD-10-CM | POA: Diagnosis not present

## 2016-06-13 DIAGNOSIS — N179 Acute kidney failure, unspecified: Secondary | ICD-10-CM | POA: Diagnosis not present

## 2016-06-13 DIAGNOSIS — F329 Major depressive disorder, single episode, unspecified: Secondary | ICD-10-CM | POA: Diagnosis not present

## 2016-06-13 DIAGNOSIS — D649 Anemia, unspecified: Secondary | ICD-10-CM

## 2016-06-13 DIAGNOSIS — M5410 Radiculopathy, site unspecified: Secondary | ICD-10-CM | POA: Diagnosis not present

## 2016-06-13 DIAGNOSIS — E1110 Type 2 diabetes mellitus with ketoacidosis without coma: Secondary | ICD-10-CM | POA: Diagnosis present

## 2016-06-13 DIAGNOSIS — E1042 Type 1 diabetes mellitus with diabetic polyneuropathy: Secondary | ICD-10-CM | POA: Diagnosis present

## 2016-06-13 DIAGNOSIS — Z9114 Patient's other noncompliance with medication regimen: Secondary | ICD-10-CM

## 2016-06-13 DIAGNOSIS — E039 Hypothyroidism, unspecified: Secondary | ICD-10-CM | POA: Diagnosis present

## 2016-06-13 DIAGNOSIS — E1010 Type 1 diabetes mellitus with ketoacidosis without coma: Secondary | ICD-10-CM | POA: Diagnosis not present

## 2016-06-13 DIAGNOSIS — Z79890 Hormone replacement therapy (postmenopausal): Secondary | ICD-10-CM

## 2016-06-13 DIAGNOSIS — E860 Dehydration: Secondary | ICD-10-CM | POA: Diagnosis not present

## 2016-06-13 DIAGNOSIS — E876 Hypokalemia: Secondary | ICD-10-CM | POA: Diagnosis not present

## 2016-06-13 DIAGNOSIS — E8729 Other acidosis: Secondary | ICD-10-CM

## 2016-06-13 DIAGNOSIS — Z981 Arthrodesis status: Secondary | ICD-10-CM

## 2016-06-13 DIAGNOSIS — Z7901 Long term (current) use of anticoagulants: Secondary | ICD-10-CM

## 2016-06-13 DIAGNOSIS — E10649 Type 1 diabetes mellitus with hypoglycemia without coma: Secondary | ICD-10-CM | POA: Diagnosis not present

## 2016-06-13 DIAGNOSIS — Z87891 Personal history of nicotine dependence: Secondary | ICD-10-CM

## 2016-06-13 DIAGNOSIS — G8929 Other chronic pain: Secondary | ICD-10-CM | POA: Diagnosis present

## 2016-06-13 DIAGNOSIS — R51 Headache: Secondary | ICD-10-CM | POA: Diagnosis not present

## 2016-06-13 DIAGNOSIS — Z79891 Long term (current) use of opiate analgesic: Secondary | ICD-10-CM

## 2016-06-13 DIAGNOSIS — E872 Acidosis: Secondary | ICD-10-CM

## 2016-06-13 DIAGNOSIS — Z91041 Radiographic dye allergy status: Secondary | ICD-10-CM

## 2016-06-13 DIAGNOSIS — R531 Weakness: Secondary | ICD-10-CM | POA: Diagnosis not present

## 2016-06-13 DIAGNOSIS — R739 Hyperglycemia, unspecified: Secondary | ICD-10-CM | POA: Diagnosis not present

## 2016-06-13 DIAGNOSIS — E871 Hypo-osmolality and hyponatremia: Secondary | ICD-10-CM | POA: Diagnosis not present

## 2016-06-13 DIAGNOSIS — Z885 Allergy status to narcotic agent status: Secondary | ICD-10-CM

## 2016-06-14 ENCOUNTER — Inpatient Hospital Stay: Payer: Medicare Other

## 2016-06-14 ENCOUNTER — Emergency Department: Payer: Medicare Other

## 2016-06-14 DIAGNOSIS — Z9641 Presence of insulin pump (external) (internal): Secondary | ICD-10-CM | POA: Diagnosis present

## 2016-06-14 DIAGNOSIS — E039 Hypothyroidism, unspecified: Secondary | ICD-10-CM | POA: Diagnosis present

## 2016-06-14 DIAGNOSIS — R531 Weakness: Secondary | ICD-10-CM | POA: Diagnosis not present

## 2016-06-14 DIAGNOSIS — E1110 Type 2 diabetes mellitus with ketoacidosis without coma: Secondary | ICD-10-CM | POA: Diagnosis present

## 2016-06-14 DIAGNOSIS — E876 Hypokalemia: Secondary | ICD-10-CM | POA: Diagnosis not present

## 2016-06-14 DIAGNOSIS — K6289 Other specified diseases of anus and rectum: Secondary | ICD-10-CM | POA: Diagnosis not present

## 2016-06-14 DIAGNOSIS — N179 Acute kidney failure, unspecified: Secondary | ICD-10-CM | POA: Diagnosis present

## 2016-06-14 DIAGNOSIS — Z79891 Long term (current) use of opiate analgesic: Secondary | ICD-10-CM | POA: Diagnosis not present

## 2016-06-14 DIAGNOSIS — Z87891 Personal history of nicotine dependence: Secondary | ICD-10-CM | POA: Diagnosis not present

## 2016-06-14 DIAGNOSIS — R51 Headache: Secondary | ICD-10-CM | POA: Diagnosis present

## 2016-06-14 DIAGNOSIS — Z981 Arthrodesis status: Secondary | ICD-10-CM | POA: Diagnosis not present

## 2016-06-14 DIAGNOSIS — G4733 Obstructive sleep apnea (adult) (pediatric): Secondary | ICD-10-CM | POA: Diagnosis present

## 2016-06-14 DIAGNOSIS — Z79890 Hormone replacement therapy (postmenopausal): Secondary | ICD-10-CM | POA: Diagnosis not present

## 2016-06-14 DIAGNOSIS — K8689 Other specified diseases of pancreas: Secondary | ICD-10-CM | POA: Diagnosis present

## 2016-06-14 DIAGNOSIS — Z9114 Patient's other noncompliance with medication regimen: Secondary | ICD-10-CM | POA: Diagnosis not present

## 2016-06-14 DIAGNOSIS — M5410 Radiculopathy, site unspecified: Secondary | ICD-10-CM | POA: Diagnosis present

## 2016-06-14 DIAGNOSIS — I10 Essential (primary) hypertension: Secondary | ICD-10-CM | POA: Diagnosis not present

## 2016-06-14 DIAGNOSIS — E1010 Type 1 diabetes mellitus with ketoacidosis without coma: Principal | ICD-10-CM

## 2016-06-14 DIAGNOSIS — I4891 Unspecified atrial fibrillation: Secondary | ICD-10-CM | POA: Diagnosis not present

## 2016-06-14 DIAGNOSIS — E1042 Type 1 diabetes mellitus with diabetic polyneuropathy: Secondary | ICD-10-CM

## 2016-06-14 DIAGNOSIS — Z7901 Long term (current) use of anticoagulants: Secondary | ICD-10-CM | POA: Diagnosis not present

## 2016-06-14 DIAGNOSIS — E871 Hypo-osmolality and hyponatremia: Secondary | ICD-10-CM | POA: Diagnosis present

## 2016-06-14 DIAGNOSIS — E860 Dehydration: Secondary | ICD-10-CM | POA: Diagnosis present

## 2016-06-14 DIAGNOSIS — G8929 Other chronic pain: Secondary | ICD-10-CM | POA: Diagnosis present

## 2016-06-14 DIAGNOSIS — E1310 Other specified diabetes mellitus with ketoacidosis without coma: Secondary | ICD-10-CM | POA: Diagnosis not present

## 2016-06-14 DIAGNOSIS — E10649 Type 1 diabetes mellitus with hypoglycemia without coma: Secondary | ICD-10-CM | POA: Diagnosis not present

## 2016-06-14 DIAGNOSIS — Z794 Long term (current) use of insulin: Secondary | ICD-10-CM | POA: Diagnosis not present

## 2016-06-14 DIAGNOSIS — F329 Major depressive disorder, single episode, unspecified: Secondary | ICD-10-CM | POA: Diagnosis not present

## 2016-06-14 DIAGNOSIS — F419 Anxiety disorder, unspecified: Secondary | ICD-10-CM | POA: Diagnosis present

## 2016-06-14 LAB — BASIC METABOLIC PANEL
ANION GAP: 18 — AB (ref 5–15)
ANION GAP: 4 — AB (ref 5–15)
ANION GAP: 7 (ref 5–15)
Anion gap: 20 — ABNORMAL HIGH (ref 5–15)
Anion gap: 8 (ref 5–15)
BUN: 31 mg/dL — ABNORMAL HIGH (ref 6–20)
BUN: 33 mg/dL — AB (ref 6–20)
BUN: 33 mg/dL — ABNORMAL HIGH (ref 6–20)
BUN: 34 mg/dL — ABNORMAL HIGH (ref 6–20)
BUN: 35 mg/dL — ABNORMAL HIGH (ref 6–20)
CALCIUM: 10.2 mg/dL (ref 8.9–10.3)
CHLORIDE: 104 mmol/L (ref 101–111)
CO2: 17 mmol/L — AB (ref 22–32)
CO2: 17 mmol/L — ABNORMAL LOW (ref 22–32)
CO2: 21 mmol/L — ABNORMAL LOW (ref 22–32)
CO2: 24 mmol/L (ref 22–32)
CO2: 25 mmol/L (ref 22–32)
CREATININE: 1.04 mg/dL — AB (ref 0.44–1.00)
CREATININE: 1.28 mg/dL — AB (ref 0.44–1.00)
Calcium: 9.3 mg/dL (ref 8.9–10.3)
Calcium: 8.7 mg/dL — ABNORMAL LOW (ref 8.9–10.3)
Calcium: 9.1 mg/dL (ref 8.9–10.3)
Calcium: 9 mg/dL (ref 8.9–10.3)
Chloride: 103 mmol/L (ref 101–111)
Chloride: 106 mmol/L (ref 101–111)
Chloride: 91 mmol/L — ABNORMAL LOW (ref 101–111)
Chloride: 98 mmol/L — ABNORMAL LOW (ref 101–111)
Creatinine, Ser: 1.36 mg/dL — ABNORMAL HIGH (ref 0.44–1.00)
Creatinine, Ser: 0.88 mg/dL (ref 0.44–1.00)
Creatinine, Ser: 0.91 mg/dL (ref 0.44–1.00)
GFR calc Af Amer: 46 mL/min — ABNORMAL LOW (ref >60)
GFR calc Af Amer: >60 mL/min (ref >60)
GFR calc Af Amer: >60 mL/min (ref >60)
GFR calc non Af Amer: 40 mL/min — ABNORMAL LOW (ref >60)
GFR calc non Af Amer: 43 mL/min — ABNORMAL LOW (ref >60)
GFR calc non Af Amer: 55 mL/min — ABNORMAL LOW (ref >60)
GFR calc non Af Amer: >60 mL/min (ref >60)
GFR calc non Af Amer: >60 mL/min (ref >60)
GFR, EST AFRICAN AMERICAN: 49 mL/min — AB (ref >60)
GLUCOSE: 154 mg/dL — AB (ref 65–99)
Glucose, Bld: 280 mg/dL — ABNORMAL HIGH (ref 65–99)
Glucose, Bld: 574 mg/dL (ref 65–99)
Glucose, Bld: 671 mg/dL (ref 65–99)
Glucose, Bld: 74 mg/dL (ref 65–99)
POTASSIUM: 3.5 mmol/L (ref 3.5–5.1)
POTASSIUM: 2.7 mmol/L — AB (ref 3.5–5.1)
POTASSIUM: 3 mmol/L — AB (ref 3.5–5.1)
POTASSIUM: 3.3 mmol/L — AB (ref 3.5–5.1)
Potassium: 4.9 mmol/L (ref 3.5–5.1)
SODIUM: 128 mmol/L — AB (ref 135–145)
SODIUM: 133 mmol/L — AB (ref 135–145)
SODIUM: 135 mmol/L (ref 135–145)
Sodium: 133 mmol/L — ABNORMAL LOW (ref 135–145)
Sodium: 134 mmol/L — ABNORMAL LOW (ref 135–145)

## 2016-06-14 LAB — CBC
HCT: 35.8 % (ref 35.0–47.0)
HCT: 42.6 % (ref 35.0–47.0)
HEMATOCRIT: 39.1 % (ref 35.0–47.0)
HEMOGLOBIN: 13.1 g/dL (ref 12.0–16.0)
Hemoglobin: 12.5 g/dL (ref 12.0–16.0)
Hemoglobin: 14.2 g/dL (ref 12.0–16.0)
MCH: 32.7 pg (ref 26.0–34.0)
MCH: 32.9 pg (ref 26.0–34.0)
MCH: 33.4 pg (ref 26.0–34.0)
MCHC: 33.3 g/dL (ref 32.0–36.0)
MCHC: 33.4 g/dL (ref 32.0–36.0)
MCHC: 34.8 g/dL (ref 32.0–36.0)
MCV: 100.1 fL — ABNORMAL HIGH (ref 80.0–100.0)
MCV: 94.4 fL (ref 80.0–100.0)
MCV: 98.2 fL (ref 80.0–100.0)
PLATELETS: 274 10*3/uL (ref 150–440)
PLATELETS: 297 10*3/uL (ref 150–440)
Platelets: 246 10*3/uL (ref 150–440)
RBC: 3.79 MIL/uL — ABNORMAL LOW (ref 3.80–5.20)
RBC: 3.91 MIL/uL (ref 3.80–5.20)
RBC: 4.34 MIL/uL (ref 3.80–5.20)
RDW: 13.6 % (ref 11.5–14.5)
RDW: 13.7 % (ref 11.5–14.5)
RDW: 13.7 % (ref 11.5–14.5)
WBC: 11.9 10*3/uL — AB (ref 3.6–11.0)
WBC: 13.6 10*3/uL — AB (ref 3.6–11.0)
WBC: 16.1 10*3/uL — ABNORMAL HIGH (ref 3.6–11.0)

## 2016-06-14 LAB — GLUCOSE, CAPILLARY
GLUCOSE-CAPILLARY: 201 mg/dL — AB (ref 65–99)
GLUCOSE-CAPILLARY: 147 mg/dL — AB (ref 65–99)
GLUCOSE-CAPILLARY: 84 mg/dL (ref 65–99)
GLUCOSE-CAPILLARY: 115 mg/dL — AB (ref 65–99)
GLUCOSE-CAPILLARY: 62 mg/dL — AB (ref 65–99)
GLUCOSE-CAPILLARY: 109 mg/dL — AB (ref 65–99)
GLUCOSE-CAPILLARY: 111 mg/dL — AB (ref 65–99)
GLUCOSE-CAPILLARY: 79 mg/dL (ref 65–99)
Glucose-Capillary: 102 mg/dL — ABNORMAL HIGH (ref 65–99)
Glucose-Capillary: 256 mg/dL — ABNORMAL HIGH (ref 65–99)
Glucose-Capillary: 306 mg/dL — ABNORMAL HIGH (ref 65–99)
Glucose-Capillary: 382 mg/dL — ABNORMAL HIGH (ref 65–99)
Glucose-Capillary: 39 mg/dL — CL (ref 65–99)
Glucose-Capillary: 439 mg/dL — ABNORMAL HIGH (ref 65–99)
Glucose-Capillary: 77 mg/dL (ref 65–99)
Glucose-Capillary: 78 mg/dL (ref 65–99)
Glucose-Capillary: >600 mg/dL (ref 65–99)
Glucose-Capillary: >600 mg/dL (ref 65–99)
Glucose-Capillary: >600 mg/dL (ref 65–99)
Glucose-Capillary: >600 mg/dL (ref 65–99)

## 2016-06-14 LAB — URINALYSIS, COMPLETE (UACMP) WITH MICROSCOPIC
Bilirubin Urine: NEGATIVE
KETONES UR: 20 mg/dL — AB
Nitrite: NEGATIVE
PH: 5 (ref 5.0–8.0)
Protein, ur: NEGATIVE mg/dL
SPECIFIC GRAVITY, URINE: 1.018 (ref 1.005–1.030)

## 2016-06-14 LAB — MAGNESIUM: Magnesium: 1.7 mg/dL (ref 1.7–2.4)

## 2016-06-14 LAB — PHOSPHORUS: PHOSPHORUS: 4.7 mg/dL — AB (ref 2.5–4.6)

## 2016-06-14 LAB — PROTIME-INR
INR: 3.82
PROTHROMBIN TIME: 38.6 s — AB (ref 11.4–15.2)

## 2016-06-14 LAB — POTASSIUM: Potassium: 3.5 mmol/L (ref 3.5–5.1)

## 2016-06-14 LAB — BETA-HYDROXYBUTYRIC ACID: BETA-HYDROXYBUTYRIC ACID: 7.53 mmol/L — AB (ref 0.05–0.27)

## 2016-06-14 LAB — MRSA PCR SCREENING: MRSA by PCR: NEGATIVE

## 2016-06-14 MED ORDER — DEXTROSE-NACL 5-0.45 % IV SOLN: INTRAVENOUS | Status: DC

## 2016-06-14 MED ORDER — INSULIN REGULAR HUMAN 100 UNIT/ML IJ SOLN: INTRAMUSCULAR | Status: DC

## 2016-06-14 MED ORDER — CITALOPRAM HYDROBROMIDE 20 MG PO TABS
20.0000 mg | ORAL_TABLET | Freq: Every day | ORAL | Status: DC
  Administered 2016-06-14: 20 mg via ORAL
  Filled 2016-06-14 (×2): qty 1

## 2016-06-14 MED ORDER — GABAPENTIN 400 MG PO CAPS
2400.0000 mg | ORAL_CAPSULE | Freq: Every day | ORAL | Status: DC
  Filled 2016-06-14: qty 6

## 2016-06-14 MED ORDER — POTASSIUM CHLORIDE 10 MEQ/100ML IV SOLN: 10.0000 meq | INTRAVENOUS | Status: DC

## 2016-06-14 MED ORDER — DEXTROSE-NACL 5-0.45 % IV SOLN
INTRAVENOUS | Status: DC
  Administered 2016-06-14: 09:00:00 via INTRAVENOUS

## 2016-06-14 MED ORDER — METOPROLOL SUCCINATE ER 50 MG PO TB24
50.0000 mg | ORAL_TABLET | Freq: Every day | ORAL | Status: DC
  Administered 2016-06-14 – 2016-06-15 (×2): 50 mg via ORAL
  Filled 2016-06-14 (×2): qty 1

## 2016-06-14 MED ORDER — CONJ ESTROG-MEDROXYPROGEST ACE 0.3-1.5 MG PO TABS: 1.0000 | ORAL_TABLET | Freq: Every day | ORAL | Status: DC

## 2016-06-14 MED ORDER — SODIUM CHLORIDE 0.9 % IV SOLN: INTRAVENOUS | Status: AC

## 2016-06-14 MED ORDER — DEXTROSE 50 % IV SOLN
25.0000 mL | INTRAVENOUS | Status: DC | PRN
  Filled 2016-06-14: qty 50

## 2016-06-14 MED ORDER — INSULIN REGULAR BOLUS VIA INFUSION
0.0000 [IU] | Freq: Three times a day (TID) | INTRAVENOUS | Status: DC
  Administered 2016-06-14: 4 [IU] via INTRAVENOUS
  Filled 2016-06-14: qty 10

## 2016-06-14 MED ORDER — VITAMIN E 180 MG (400 UNIT) PO CAPS
400.0000 [IU] | ORAL_CAPSULE | Freq: Every day | ORAL | Status: DC
  Administered 2016-06-14: 400 [IU] via ORAL
  Filled 2016-06-14 (×3): qty 1

## 2016-06-14 MED ORDER — VITAMIN B-12 1000 MCG PO TABS
1000.0000 ug | ORAL_TABLET | Freq: Every day | ORAL | Status: DC
Start: 2016-06-14 — End: 2016-06-15
  Filled 2016-06-14 (×3): qty 1

## 2016-06-14 MED ORDER — SODIUM CHLORIDE 0.9 % IV BOLUS (SEPSIS)
1000.0000 mL | Freq: Once | INTRAVENOUS | Status: AC
  Administered 2016-06-14: 1000 mL via INTRAVENOUS

## 2016-06-14 MED ORDER — INSULIN PUMP
Freq: Three times a day (TID) | SUBCUTANEOUS | Status: DC
  Filled 2016-06-14: qty 1

## 2016-06-14 MED ORDER — AMITRIPTYLINE HCL 25 MG PO TABS
25.0000 mg | ORAL_TABLET | Freq: Every day | ORAL | Status: DC
  Administered 2016-06-14: 25 mg via ORAL
  Filled 2016-06-14: qty 1

## 2016-06-14 MED ORDER — POTASSIUM CHLORIDE CRYS ER 20 MEQ PO TBCR
40.0000 meq | EXTENDED_RELEASE_TABLET | ORAL | Status: AC
  Administered 2016-06-14 (×2): 40 meq via ORAL
  Filled 2016-06-14 (×2): qty 2

## 2016-06-14 MED ORDER — METOPROLOL SUCCINATE ER 50 MG PO TB24: 50.0000 mg | ORAL_TABLET | Freq: Every day | ORAL | Status: DC

## 2016-06-14 MED ORDER — SODIUM CHLORIDE 0.9 % IV SOLN: INTRAVENOUS | Status: DC

## 2016-06-14 MED ORDER — LEVOTHYROXINE SODIUM 25 MCG PO TABS
25.0000 ug | ORAL_TABLET | Freq: Every day | ORAL | Status: DC
Start: 2016-06-14 — End: 2016-06-15
  Administered 2016-06-14 – 2016-06-15 (×2): 25 ug via ORAL
  Filled 2016-06-14 (×3): qty 1

## 2016-06-14 MED ORDER — MORPHINE SULFATE 15 MG PO TABS
15.0000 mg | ORAL_TABLET | Freq: Four times a day (QID) | ORAL | Status: DC | PRN
  Administered 2016-06-14 – 2016-06-15 (×4): 15 mg via ORAL
  Filled 2016-06-14 (×4): qty 1

## 2016-06-14 MED ORDER — SODIUM CHLORIDE 0.9 % IV SOLN
INTRAVENOUS | Status: DC
  Administered 2016-06-14: 5.4 [IU]/h via INTRAVENOUS
  Filled 2016-06-14 (×2): qty 1

## 2016-06-14 MED ORDER — MAGNESIUM SULFATE 2 GM/50ML IV SOLN
2.0000 g | Freq: Once | INTRAVENOUS | Status: AC
  Administered 2016-06-14: 2 g via INTRAVENOUS
  Filled 2016-06-14: qty 50

## 2016-06-14 MED ORDER — RAMIPRIL 10 MG PO CAPS
10.0000 mg | ORAL_CAPSULE | Freq: Every day | ORAL | Status: DC
  Filled 2016-06-14: qty 1

## 2016-06-14 MED ORDER — SODIUM CHLORIDE 0.9 % IV SOLN
INTRAVENOUS | Status: DC
  Administered 2016-06-14: 03:00:00 via INTRAVENOUS

## 2016-06-14 MED ORDER — VITAMIN D 1000 UNITS PO TABS
2000.0000 [IU] | ORAL_TABLET | Freq: Every day | ORAL | Status: DC
  Administered 2016-06-14: 2000 [IU] via ORAL
  Filled 2016-06-14 (×2): qty 2

## 2016-06-14 MED ORDER — OXYCODONE HCL 5 MG PO TABS: 10.0000 mg | ORAL_TABLET | Freq: Four times a day (QID) | ORAL | Status: DC

## 2016-06-14 MED ORDER — ALPRAZOLAM 0.25 MG PO TABS
0.2500 mg | ORAL_TABLET | Freq: Every evening | ORAL | Status: DC | PRN
Start: 2016-06-14 — End: 2016-06-15

## 2016-06-14 MED ORDER — PANCRELIPASE (LIP-PROT-AMYL) 12000-38000 UNITS PO CPEP
12000.0000 [IU] | ORAL_CAPSULE | Freq: Three times a day (TID) | ORAL | Status: DC
  Administered 2016-06-14 – 2016-06-15 (×3): 12000 [IU] via ORAL
  Filled 2016-06-14 (×2): qty 1

## 2016-06-14 MED ORDER — GABAPENTIN 300 MG PO CAPS: 300.0000 mg | ORAL_CAPSULE | Freq: Every day | ORAL | Status: DC

## 2016-06-14 MED ORDER — ENOXAPARIN SODIUM 40 MG/0.4ML ~~LOC~~ SOLN
40.0000 mg | SUBCUTANEOUS | Status: DC
  Administered 2016-06-14: 40 mg via SUBCUTANEOUS
  Filled 2016-06-14: qty 0.4

## 2016-06-14 NOTE — Progress Notes (Signed)
Pt care assumed, report received.

## 2016-06-14 NOTE — Progress Notes (Signed)
Woke patient up to take 2200 medications, refused amitriptyline. States this is not when I take it, now my medication will be all messed up.  Angry that her morphine is ordered PRN and not scheduled. Patient stated she is exhausted from taking care of her husband who has parkinsons and is at [facility] for rehab.  She stated that no one can take care of him but her. She stated she has not had any rest in about 3 weeks.  Emotional support given.

## 2016-06-14 NOTE — H&P (Signed)
History and Physical   [facility] - [place] @ [facility] Admission History and Physical [employer], D.O.    Patient Name: Colleen Payne MR#: [id number] Date of Birth: [birth year] Date of Admission: 06/13/2016  Referring MD/NP/PA: Dr. Quentin Cornwall Primary Care Physician: Kirk Ruths, MD Patient coming from: Home Outpatient Specialists: Dr. Ubaldo Glassing, Dr. Graceann Congress  Chief Complaint:  Chief Complaint  Patient presents with  . Hyperglycemia    HPI: Colleen Payne is a 66 y.o. female with a known history of IDDM with pump, atrial fibrillation on coumadin, depression, HTN, hypothyroidism presents to the emergency department for evaluation of high blood sugar.  Patient was in a usual state of health until about one week ago when she reports feeling chills and dysuria.  She reports increased thirst.  Patient states that she has not been taking very good care of herself as she is the primary caregiver for her husband who has Parkinson's disease. She states that she often delays taking her before meal bolus insulin.  Patient denies weakness, dizziness, chest pain, shortness of breath, N/V/C/D, abdominal pain,  changes in mental status.    Otherwise there has been no change in status. Patient has been taking medication as prescribed and there has been no recent change in medication or diet.  No recent antibiotics.  There has been no recent illness, hospitalizations, travel or sick contacts.    EMS/ED Course: Patient received 1 L normal saline bolus and insulin drip was started. Patient was found with blood glucose of 314 by basal metabolic profile, pH 9.70. DKA protocol was initiated and hospital stepdown admission was requested.  Review of Systems:  CONSTITUTIONAL: Positive chills. Negative fatigue, weakness, weight gain/loss, headache. EYES: No blurry or double vision. ENT: No tinnitus, postnasal drip, redness or soreness of the oropharynx. RESPIRATORY: No cough, dyspnea, wheeze.  No  hemoptysis.  CARDIOVASCULAR: No chest pain, palpitations, syncope, orthopnea. No lower extremity edema.  GASTROINTESTINAL: No nausea, vomiting, abdominal pain, diarrhea, constipation.  No hematemesis, melena or hematochezia. GENITOURINARY: Positive dysuria, negative frequency, hematuria. ENDOCRINE: Positive thirst. No polyuria or nocturia. No heat or cold intolerance. HEMATOLOGY: No anemia, bruising, bleeding. INTEGUMENTARY: No rashes, ulcers, lesions. MUSCULOSKELETAL: No arthritis, gout, dyspnea. NEUROLOGIC: No numbness, tingling, ataxia, seizure-type activity, weakness. PSYCHIATRIC: No anxiety, depression, insomnia.   Past Medical History:  Diagnosis Date  . Anxiety   . Atrial fibrillation ([place])   . Depression   . Diabetes mellitus without complication (HCC)    Type I   . Headache    stress. 1x/month  . Hypertension   . Insulin pump in place   . Motion sickness    cars  . MVP (mitral valve prolapse)   . Neuromuscular disorder (HCC)    leg weakness s/p back surgeries  . Thyroid disease     Past Surgical History:  Procedure Laterality Date  . BACK SURGERY  2004   3 sugeries between Sept and Nov, fusion L2-S1  . CATARACT EXTRACTION W/PHACO Left 11/23/2014   Procedure: CATARACT EXTRACTION PHACO AND INTRAOCULAR LENS PLACEMENT (IOC);  Surgeon: Leandrew Koyanagi, MD;  Location: [place];  Service: Ophthalmology;  Laterality: Left;  DIABETIC - insulin pump     reports that she has quit smoking. She started smoking about 14 years ago. She has never used smokeless tobacco. She reports that she does not drink alcohol or use drugs.  Allergies  Allergen Reactions  . Codeine Other (See Comments)    dizziness  . Contrast Media [Iodinated Diagnostic Agents] Other (See  Comments)    Reddened skin - face and chest. Felt hot, burning sensation    Family History  Problem Relation Age of Onset  . Hypertension Mother   . Cancer Mother        Colon  . Hypertension Father      Prior to Admission medications   Medication Sig Start Date End Date Taking? Authorizing Provider  ALPRAZolam (XANAX) 0.25 MG tablet 0.25 mg as needed.  11/27/12   [provider]  amitriptyline (ELAVIL) 25 MG tablet Take 25 mg by mouth at bedtime.  01/26/10   [provider]  Buprenorphine (BUTRANS) 15 MCG/HR [place] onto the skin.    [provider]  Cholecalciferol (PA VITAMIN D-3) 2000 UNITS CAPS Take 1 capsule by mouth daily.     [provider]  citalopram (CELEXA) 20 MG tablet Take 20 mg by mouth daily.     [provider]  estrogen, conjugated,-medroxyprogesterone (PREMPRO) 0.3-1.5 MG per tablet Take one tablet by mouth daily. 06/02/13   [provider]  gabapentin (NEURONTIN) 300 MG capsule Take 2,400 mg by mouth daily.  03/26/12   [provider]  glucose blood (ONE TOUCH ULTRA TEST) test strip USE TO TEST BLOOD SUGAR 8 TIMES DAILY. DX: E10.42 04/17/15   Philemon Kingdom, MD  HUMALOG 100 UNIT/ML injection INJECT 1ML (100 UNITS) INTO THE SKIN DAILY. FOR USE IN PUMP 10/25/15   Philemon Kingdom, MD  hydrochlorothiazide (HYDRODIURIL) 25 MG tablet Take 25 mg by mouth daily.  08/13/13 08/13/14  [provider]  insulin aspart (NOVOLOG) 100 UNIT/ML injection Use 100 units daily via insulin pump. 05/19/15   Philemon Kingdom, MD  levothyroxine (SYNTHROID, LEVOTHROID) 25 MCG tablet Take 25 mcg by mouth daily before breakfast.  10/15/11   [provider]  lipase/protease/amylase (CREON) 12000 UNITS CPEP capsule Take 12,000 Units by mouth.     [provider]  metoprolol succinate (TOPROL-XL) 25 MG 24 hr tablet Take 50 mg by mouth daily with breakfast. 1 tablet with dinner. 10/15/11   [provider]  morphine (MSIR) 15 MG tablet Take by mouth. 06/04/13   [provider]  norethindrone-ethinyl estradiol (FEMHRT 1/5) 1-5 MG-MCG TABS Take by mouth. 02/02/14   [provider]  Oxycodone HCl  10 MG TABS Take 10 mg by mouth 4 (four) times daily.     [provider]  ramipril (ALTACE) 10 MG capsule Take 10 mg by mouth daily with breakfast.  02/14/11   [provider]  vitamin B-12 (CYANOCOBALAMIN) 1000 MCG tablet Take 1,000 mcg by mouth daily.     [provider]  vitamin E 400 UNIT capsule Take 400 Units by mouth daily.     [provider]  warfarin (COUMADIN) 2.5 MG tablet Take 2.5 mg by mouth daily with breakfast.  09/21/13 12/20/13  [provider]  warfarin (COUMADIN) 5 MG tablet Take 5 mg by mouth daily with breakfast.  09/21/13 12/20/13  [provider]    Physical Exam: Vitals:   06/13/16 2356 06/14/16 0000 06/14/16 0004  BP:  (!) 120/46 (!) 120/46  Pulse:  80 (!) 109  Resp:  18 18  Temp:   98.3 F (36.8 C)  TempSrc:   Oral  SpO2:  100% 97%  Weight: 47.6 kg (105 lb)    Height: 5\' 4"  (1.626 m)      GENERAL: 66 y.o.-year-old White female patient, well-developed, well-nourished lying in the bed in no acute distress. Somewhat lethargic but arousable. HEENT:  Head atraumatic, normocephalic. Pupils equal, round, reactive to light and accommodation. No scleral icterus. Extraocular muscles intact. Nares are patent. Oropharynx is clear. Mucus membranes dry. NECK: Supple, full range of motion. No JVD, no bruit heard. No thyroid enlargement, no tenderness, no cervical lymphadenopathy. CHEST: Normal breath sounds bilaterally. No wheezing, rales, rhonchi or crackles. No use of accessory muscles of respiration.  No reproducible chest wall tenderness.  CARDIOVASCULAR: S1, S2 normal. Positive systolic ejection murmur left sternal border. Cap refill <2 seconds. Pulses intact distally.  ABDOMEN: Soft, nondistended, nontender. No rebound, guarding, rigidity. Normoactive bowel sounds present in all four quadrants. No organomegaly or mass. EXTREMITIES: No pedal edema, cyanosis, or clubbing. No calf tenderness or Homan's sign.  NEUROLOGIC: The  patient is alert and oriented x 3. Cranial nerves II through XII are grossly intact with no focal sensorimotor deficit. Muscle strength 5/5 in all extremities. Sensation intact. Gait not checked. SKIN: Warm, dry, and intact without obvious rash, lesion, or ulcer.    Labs on Admission:  CBC:  Recent Labs Lab 06/14/16 0009  WBC 13.6*  HGB 14.2  HCT 42.6  MCV 98.2  PLT 270   Basic Metabolic Panel:  Recent Labs Lab 06/14/16 0009  NA 128*  K 4.9  CL 91*  CO2 17*  GLUCOSE 671*  BUN 33*  CREATININE 1.28*  CALCIUM 10.2   GFR: Estimated Creatinine Clearance: 32.5 mL/min (A) (by C-G formula based on SCr of 1.28 mg/dL (H)). Liver Function Tests: No results for input(s): AST, ALT, ALKPHOS, BILITOT, PROT, ALBUMIN in the last 168 hours. No results for input(s): LIPASE, AMYLASE in the last 168 hours. No results for input(s): AMMONIA in the last 168 hours. Coagulation Profile: No results for input(s): INR, PROTIME in the last 168 hours. Cardiac Enzymes: No results for input(s): CKTOTAL, CKMB, CKMBINDEX, TROPONINI in the last 168 hours. BNP (last 3 results) No results for input(s): PROBNP in the last 8760 hours. HbA1C: No results for input(s): HGBA1C in the last 72 hours. CBG:  Recent Labs Lab 06/14/16 0007  GLUCAP >600*   Lipid Profile: No results for input(s): CHOL, HDL, LDLCALC, TRIG, CHOLHDL, LDLDIRECT in the last 72 hours. Thyroid Function Tests: No results for input(s): TSH, T4TOTAL, FREET4, T3FREE, THYROIDAB in the last 72 hours. Anemia Panel: No results for input(s): VITAMINB12, FOLATE, FERRITIN, TIBC, IRON, RETICCTPCT in the last 72 hours. Urine analysis: No results found for: COLORURINE, APPEARANCEUR, LABSPEC, PHURINE, GLUCOSEU, HGBUR, BILIRUBINUR, KETONESUR, PROTEINUR, UROBILINOGEN, NITRITE, LEUKOCYTESUR Sepsis Labs: @LABRCNTIP (procalcitonin:4,lacticidven:4) )No results found for this or any previous visit (from the past 240 hour(s)).   Radiological Exams on  Admission: Dg Chest Portable 1 View  Result Date: 06/14/2016 CLINICAL DATA:  Weakness and hyperglycemia. EXAM: PORTABLE CHEST 1 VIEW COMPARISON:  07/31/2010 FINDINGS: Normal heart size and pulmonary vascularity. Diffuse emphysematous changes and scattered fibrosis in the lungs. No focal consolidation or airspace disease. No blunting of costophrenic angles. No pneumothorax. Thoracolumbar scoliosis with the proximal convexity towards the right and distal convexity towards the left. Postoperative changes in the lumbar spine. IMPRESSION: Emphysematous changes in the lungs. No evidence of active pulmonary disease. Electronically Signed   By: Lucienne Capers M.D.   On: 06/14/2016 00:53    EKG: Normal sinus rhythm at 80 bpm with normal axis and nonspecific ST-T wave changes.   Assessment/Plan  This is a 66 y.o. female with a history of IDDM with pump, pancreatic insufficiency atrial fibrillation on coumadin, depression, HTN, hypothyroidism, obstructive sleep apnea now being admitted with:  #.  DKA -Admit to inpatient, stepdown -Insulin per protocol. Hold insulin pump -IV fluids and electrolyte management per protocol -Add on beta hydroxybutyrate -Critical care consultation requested discussed with Dr. Mortimer Fries -Follow up blood and urine cultures. Consider Rocephin if the urine positive -Continue Creon  #. Acute kidney injury and hyponatremia secondary to dehydration. Baseline creatinine is 0.8 now up to 1.28 - IV fluids and repeat BMP in AM.  - Avoid nephrotoxic medications: Hold her Avapro - Bladder scan and place foley catheter if evidence of urinary retention  #. History of atrial fibrillation -Continue metoprolol, hydrochlorothiazide -Check PT/INR -Coumadin per pharmacy  #. History of chronic pain and radiculopathy - Continue continue home medication regimen  #. History of hypothyroidism - Continue Synthroid  #. History of depression, anxiety - Continue Celexa, amitriptyline,  Xanax  Admission status: Inpatient stepdown IV Fluids: Normal saline Diet/Nutrition: Nothing by mouth Consults called: Critical care  DVT Px: Lovenox, SCDs and early ambulation. Code Status: Full Code  Disposition Plan: To home in 1-2 days  All the records are reviewed and case discussed with ED provider. Management plans discussed with the patient and/or family who express understanding and agree with plan of care.  Colleen Payne D.O. on 06/14/2016 at 1:26 AM Between 7am to 6pm - Pager - [phone] After 6pm go to [email or web address] - [employer] [place] [facility] Office [phone] CC: Primary care physician; Kirk Ruths, MD   06/14/2016, 1:26 AM

## 2016-06-14 NOTE — Progress Notes (Signed)
Per DKA protocol, Pt's Insulin infusion paused at this time, awaiting potassium replacement.  Current K, 2.7.  Dr. Jefferson Fuel to give orders for K replacement.

## 2016-06-14 NOTE — Progress Notes (Signed)
Pt found to be hypoglycemic BS 39mg /dl.  Pt given 4 oz of orange juice to drink.  She was able to do so, dinner tray in front of patient.  Encouraging here to eat.  Will recheck glucose in 10 minutes.

## 2016-06-14 NOTE — ED Provider Notes (Signed)
[facility] Emergency Department Provider Note    First MD Initiated Contact with Patient 06/14/16 0012     (approximate)  I have reviewed the triage vital signs and the nursing notes.   HISTORY  Chief Complaint Hyperglycemia    HPI Colleen Payne is a 66 y.o. female with a history of type 1 diabetes as well as A. fib presents with high blood sugars associated with headache weakness and nausea. States that the blood sugars of an out-of-control for the past several weeks but became the point today that she started feeling nausea which point she came to the ER. States that she has been compliant with her home insulin medications. Denies any fevers. States she has had increased urinary frequency. Does feel dehydrated. Denies any chest pain or shortness of breath.   Past Medical History:  Diagnosis Date  . Anxiety   . Atrial fibrillation ([place])   . Depression   . Diabetes mellitus without complication (HCC)    Type I   . Headache    stress. 1x/month  . Hypertension   . Insulin pump in place   . Motion sickness    cars  . MVP (mitral valve prolapse)   . Neuromuscular disorder (HCC)    leg weakness s/p back surgeries  . Thyroid disease    Family History  Problem Relation Age of Onset  . Hypertension Mother   . Cancer Mother        Colon  . Hypertension Father    Past Surgical History:  Procedure Laterality Date  . BACK SURGERY  2004   3 sugeries between Sept and Nov, fusion L2-S1  . CATARACT EXTRACTION W/PHACO Left 11/23/2014   Procedure: CATARACT EXTRACTION PHACO AND INTRAOCULAR LENS PLACEMENT (IOC);  Surgeon: Leandrew Koyanagi, MD;  Location: [place];  Service: Ophthalmology;  Laterality: Left;  DIABETIC - insulin pump   Patient Active Problem List   Diagnosis Date Noted  . DKA (diabetic ketoacidoses) ([place]) 06/14/2016  . Type 1 diabetes mellitus with diabetic polyneuropathy ([place]) 10/26/2013      Prior to Admission  medications   Medication Sig Start Date End Date Taking? Authorizing Provider  ALPRAZolam (XANAX) 0.25 MG tablet 0.25 mg as needed.  11/27/12   [provider]  amitriptyline (ELAVIL) 25 MG tablet Take 25 mg by mouth at bedtime.  01/26/10   [provider]  Buprenorphine (BUTRANS) 15 MCG/HR [place] onto the skin.    [provider]  Cholecalciferol (PA VITAMIN D-3) 2000 UNITS CAPS Take 1 capsule by mouth daily.     [provider]  citalopram (CELEXA) 20 MG tablet Take 20 mg by mouth daily.     [provider]  estrogen, conjugated,-medroxyprogesterone (PREMPRO) 0.3-1.5 MG per tablet Take one tablet by mouth daily. 06/02/13   [provider]  gabapentin (NEURONTIN) 300 MG capsule Take 2,400 mg by mouth daily.  03/26/12   [provider]  glucose blood (ONE TOUCH ULTRA TEST) test strip USE TO TEST BLOOD SUGAR 8 TIMES DAILY. DX: E10.42 04/17/15   Philemon Kingdom, MD  HUMALOG 100 UNIT/ML injection INJECT 1ML (100 UNITS) INTO THE SKIN DAILY. FOR USE IN PUMP 10/25/15   Philemon Kingdom, MD  hydrochlorothiazide (HYDRODIURIL) 25 MG tablet Take 25 mg by mouth daily.  08/13/13 08/13/14  [provider]  insulin aspart (NOVOLOG) 100 UNIT/ML injection Use 100 units daily via insulin pump. 05/19/15   Philemon Kingdom, MD  levothyroxine (SYNTHROID, LEVOTHROID) 25 MCG tablet  Take 25 mcg by mouth daily before breakfast.  10/15/11   [provider]  lipase/protease/amylase (CREON) 12000 UNITS CPEP capsule Take 12,000 Units by mouth.     [provider]  metoprolol succinate (TOPROL-XL) 25 MG 24 hr tablet Take 50 mg by mouth daily with breakfast. 1 tablet with dinner. 10/15/11   [provider]  morphine (MSIR) 15 MG tablet Take by mouth. 06/04/13   [provider]  norethindrone-ethinyl estradiol (FEMHRT 1/5) 1-5 MG-MCG TABS Take by mouth. 02/02/14   [provider]  Oxycodone HCl 10 MG TABS Take 10 mg by  mouth 4 (four) times daily.     [provider]  ramipril (ALTACE) 10 MG capsule Take 10 mg by mouth daily with breakfast.  02/14/11   [provider]  vitamin B-12 (CYANOCOBALAMIN) 1000 MCG tablet Take 1,000 mcg by mouth daily.     [provider]  vitamin E 400 UNIT capsule Take 400 Units by mouth daily.     [provider]  warfarin (COUMADIN) 2.5 MG tablet Take 2.5 mg by mouth daily with breakfast.  09/21/13 12/20/13  [provider]  warfarin (COUMADIN) 5 MG tablet Take 5 mg by mouth daily with breakfast.  09/21/13 12/20/13  [provider]    Allergies Codeine and Contrast media [iodinated diagnostic agents]    Social History Social History  Substance Use Topics  . Smoking status: Former Smoker    Start date: 10/26/2001  . Smokeless tobacco: Never Used  . Alcohol use No    Review of Systems Patient denies headaches, rhinorrhea, blurry vision, numbness, shortness of breath, chest pain, edema, cough, abdominal pain, nausea, vomiting, diarrhea, dysuria, fevers, rashes or hallucinations unless otherwise stated above in HPI. ____________________________________________   PHYSICAL EXAM:  VITAL SIGNS: Vitals:   06/14/16 0004 06/14/16 0155  BP: (!) 120/46 135/66  Pulse: (!) 109 78  Resp: 18 14  Temp: 98.3 F (36.8 C)     Constitutional: Alert and oriented. in no acute distress. Eyes: Conjunctivae are normal. PERRL. EOMI. Head: Atraumatic. Nose: No congestion/rhinnorhea. Mouth/Throat: Mucous membranes are moist.  Oropharynx non-erythematous. Neck: No stridor. Painless ROM. No cervical spine tenderness to palpation Hematological/Lymphatic/Immunilogical: No cervical lymphadenopathy. Cardiovascular: Normal rate, regular rhythm. + murmur.  Good peripheral circulation. Respiratory: Normal respiratory effort.  No retractions. Lungs CTAB. Gastrointestinal: Soft and nontender. No distention. No abdominal bruits. No CVA  tenderness. Musculoskeletal: No lower extremity tenderness nor edema.  No joint effusions. Neurologic:  Normal speech and language. No gross focal neurologic deficits are appreciated. No gait instability. Skin:  Skin is warm, dry and intact. No rash noted. Psychiatric: Mood and affect are normal. Speech and behavior are normal.  ____________________________________________   LABS (all labs ordered are listed, but only abnormal results are displayed)  Results for orders placed or performed during the hospital encounter of 06/13/16 (from the past 24 hour(s))  Glucose, capillary     Status: Abnormal   Collection Time: 06/14/16 12:07 AM  Result Value Ref Range   Glucose-Capillary >600 (HH) 65 - 99 mg/dL   Comment 1 Notify RN    Comment 2 Document in Chart   Basic metabolic panel     Status: Abnormal   Collection Time: 06/14/16 12:09 AM  Result Value Ref Range   Sodium 128 (L) 135 - 145 mmol/L   Potassium 4.9 3.5 - 5.1 mmol/L   Chloride 91 (L) 101 - 111 mmol/L   CO2 17 (L) 22 - 32 mmol/L  Glucose, Bld 671 (HH) 65 - 99 mg/dL   BUN 33 (H) 6 - 20 mg/dL   Creatinine, Ser 1.28 (H) 0.44 - 1.00 mg/dL   Calcium 10.2 8.9 - 10.3 mg/dL   GFR calc non Af Amer 43 (L) >60 mL/min   GFR calc Af Amer 49 (L) >60 mL/min   Anion gap 20 (H) 5 - 15  CBC     Status: Abnormal   Collection Time: 06/14/16 12:09 AM  Result Value Ref Range   WBC 13.6 (H) 3.6 - 11.0 K/uL   RBC 4.34 3.80 - 5.20 MIL/uL   Hemoglobin 14.2 12.0 - 16.0 g/dL   HCT 42.6 35.0 - 47.0 %   MCV 98.2 80.0 - 100.0 fL   MCH 32.7 26.0 - 34.0 pg   MCHC 33.3 32.0 - 36.0 g/dL   RDW 13.7 11.5 - 14.5 %   Platelets 297 150 - 440 K/uL  Blood gas, venous     Status: Abnormal (Preliminary result)   Collection Time: 06/14/16 12:18 AM  Result Value Ref Range   FIO2 0.21    Delivery systems ROOM AIR    pH, Ven 7.24 (L) 7.250 - 7.430   pCO2, Ven 35 (L) 44.0 - 60.0 mmHg   pO2, Ven PENDING 32.0 - 45.0 mmHg   Bicarbonate 15.0 (L) 20.0 - 28.0  mmol/L   Acid-base deficit 11.4 (H) 0.0 - 2.0 mmol/L   O2 Saturation PENDING %   Patient temperature 37.0    Collection site VEIN    Sample type VENOUS   Urinalysis, Complete w Microscopic     Status: Abnormal   Collection Time: 06/14/16  1:36 AM  Result Value Ref Range   Color, Urine YELLOW (A) YELLOW   APPearance CLOUDY (A) CLEAR   Specific Gravity, Urine 1.018 1.005 - 1.030   pH 5.0 5.0 - 8.0   Glucose, UA >=500 (A) NEGATIVE mg/dL   Hgb urine dipstick SMALL (A) NEGATIVE   Bilirubin Urine NEGATIVE NEGATIVE   Ketones, ur 20 (A) NEGATIVE mg/dL   Protein, ur NEGATIVE NEGATIVE mg/dL   Nitrite NEGATIVE NEGATIVE   Leukocytes, UA SMALL (A) NEGATIVE   RBC / HPF 0-5 0 - 5 RBC/hpf   WBC, UA 6-30 0 - 5 WBC/hpf   Bacteria, UA RARE (A) NONE SEEN   Squamous Epithelial / LPF 0-5 (A) NONE SEEN   WBC Clumps PRESENT   Glucose, capillary     Status: Abnormal   Collection Time: 06/14/16  1:44 AM  Result Value Ref Range   Glucose-Capillary >600 (HH) 65 - 99 mg/dL  MRSA PCR Screening     Status: None   Collection Time: 06/14/16  2:16 AM  Result Value Ref Range   MRSA by PCR NEGATIVE NEGATIVE  Glucose, capillary     Status: Abnormal   Collection Time: 06/14/16  2:26 AM  Result Value Ref Range   Glucose-Capillary >600 (HH) 65 - 99 mg/dL  Basic metabolic panel     Status: Abnormal   Collection Time: 06/14/16  2:59 AM  Result Value Ref Range   Sodium 133 (L) 135 - 145 mmol/L   Potassium 3.5 3.5 - 5.1 mmol/L   Chloride 98 (L) 101 - 111 mmol/L   CO2 17 (L) 22 - 32 mmol/L   Glucose, Bld 574 (HH) 65 - 99 mg/dL   BUN 35 (H) 6 - 20 mg/dL   Creatinine, Ser 1.36 (H) 0.44 - 1.00 mg/dL   Calcium 9.3 8.9 - 10.3 mg/dL  GFR calc non Af Amer 40 (L) >60 mL/min   GFR calc Af Amer 46 (L) >60 mL/min   Anion gap 18 (H) 5 - 15  CBC     Status: Abnormal   Collection Time: 06/14/16  2:59 AM  Result Value Ref Range   WBC 11.9 (H) 3.6 - 11.0 K/uL   RBC 3.91 3.80 - 5.20 MIL/uL   Hemoglobin 13.1 12.0 - 16.0  g/dL   HCT 39.1 35.0 - 47.0 %   MCV 100.1 (H) 80.0 - 100.0 fL   MCH 33.4 26.0 - 34.0 pg   MCHC 33.4 32.0 - 36.0 g/dL   RDW 13.6 11.5 - 14.5 %   Platelets 246 150 - 440 K/uL  Magnesium     Status: None   Collection Time: 06/14/16  2:59 AM  Result Value Ref Range   Magnesium 1.7 1.7 - 2.4 mg/dL  Phosphorus     Status: Abnormal   Collection Time: 06/14/16  2:59 AM  Result Value Ref Range   Phosphorus 4.7 (H) 2.5 - 4.6 mg/dL  Beta-hydroxybutyric acid     Status: Abnormal   Collection Time: 06/14/16  2:59 AM  Result Value Ref Range   Beta-Hydroxybutyric Acid 7.53 (H) 0.05 - 0.27 mmol/L  Protime-INR     Status: Abnormal   Collection Time: 06/14/16  2:59 AM  Result Value Ref Range   Prothrombin Time 38.6 (H) 11.4 - 15.2 seconds   INR 3.82   Glucose, capillary     Status: Abnormal   Collection Time: 06/14/16  3:25 AM  Result Value Ref Range   Glucose-Capillary >600 (HH) 65 - 99 mg/dL  Glucose, capillary     Status: Abnormal   Collection Time: 06/14/16  4:40 AM  Result Value Ref Range   Glucose-Capillary 439 (H) 65 - 99 mg/dL   ____________________________________________  EKG My review and personal interpretation at Time: 23:57   Indication: hyperglycemia  Rate: 80  Rhythm: sinus Axis: normal Other: normal intervals, no peaked T waves ____________________________________________  RADIOLOGY  I personally reviewed all radiographic images ordered to evaluate for the above acute complaints and reviewed radiology reports and findings.  These findings were personally discussed with the patient.  Please see medical record for radiology report.  ____________________________________________   PROCEDURES  Procedure(s) performed:  Procedures    Critical Care performed: CRITICAL CARE Performed by: Merlyn Lot   Total critical care time: 40 minutes  Critical care time was exclusive of separately billable procedures and treating other patients.  Critical care was  necessary to treat or prevent imminent or life-threatening deterioration.  Critical care was time spent personally by me on the following activities: development of treatment plan with patient and/or surrogate as well as nursing, discussions with consultants, evaluation of patient's response to treatment, examination of patient, obtaining history from patient or surrogate, ordering and performing treatments and interventions, ordering and review of laboratory studies, ordering and review of radiographic studies, pulse oximetry and re-evaluation of patient's condition.  ____________________________________________   INITIAL IMPRESSION / ASSESSMENT AND PLAN / ED COURSE  Pertinent labs & imaging results that were available during my care of the patient were reviewed by me and considered in my medical decision making (see chart for details).  DDX: DKA< dehydration, HHS, sepsis  Colleen Payne is a 66 y.o. who presents to the ED with evidence of diabetic ketoacidosis. Patient is dehydrated appearing. Patient with high anion gap metabolic acidosis and hyperglycemia consistent with DKA. Patient given IV fluids for  resuscitation as well as started on an insulin drip. Patient will require admission for further evaluation and management.      ____________________________________________   FINAL CLINICAL IMPRESSION(S) / ED DIAGNOSES  Final diagnoses:  Type 1 diabetes mellitus with ketoacidosis without coma (HCC)  High anion gap metabolic acidosis      NEW MEDICATIONS STARTED DURING THIS VISIT:  Current Discharge Medication List       Note:  This document was prepared using Dragon voice recognition software and may include unintentional dictation errors.    Merlyn Lot, MD 06/14/16 [phone]

## 2016-06-14 NOTE — Progress Notes (Addendum)
Initial Nutrition Assessment  DOCUMENTATION CODES:   Not applicable  INTERVENTION:  1. Monitor for needs  NUTRITION DIAGNOSIS:   Inadequate oral intake related to poor appetite, nausea, vomiting as evidenced by per patient/family report.  GOAL:   Patient will meet greater than or equal to 90% of their needs  MONITOR:   PO intake, I & O's, Labs, Skin  REASON FOR ASSESSMENT:   Malnutrition Screening Tool    ASSESSMENT:   Colleen Payne is a 66 y.o. female with a known history of IDDM with pump, atrial fibrillation on coumadin, depression, HTN, hypothyroidism presents to the emergency department for evaluation of high blood sugar  Spoke with Ms. Narda Bonds, daughter at bedside. She reports poor appetite on/off - states some days she will eat great and other days she will get nervous and eat little to nothing.  Reports a stable weight of 120# but patient presents to hospital at 105# Appears she was 117# as of 04/29/2016 - exhibiting a 12#/10% severe wt loss over 1.75 months. Denies any issues chewing, reports some pain with swallowing No nausea/vomiting - reports she had some nausea and dry heaves yesterday but is otherwise ok. Was eating 100% of her lunch during my visit. Nutrition-Focused physical exam completed. Findings are no fat depletion, no muscle depletion, and mild edema.  Unable to diagnose malnutrition at this time. Labs and medications reviewed: K 3.3, Vitamin D, Vitamin B12, Vitamin E, Creon D5 1/2 NS @ 134mL/hr --> 408 calories Insulin gtt  Diet Order:  Diet heart healthy/carb modified Room service appropriate? Yes; Fluid consistency: Thin  Skin:  Reviewed, no issues  Last BM:  PTA  Height:   Ht Readings from Last 1 Encounters:  06/13/16 5\' 4"  (1.626 m)    Weight:   Wt Readings from Last 1 Encounters:  06/13/16 105 lb (47.6 kg)    Ideal Body Weight:  54.54 kg  BMI:  Body mass index is 18.02 kg/m.  Estimated Nutritional Needs:   Kcal:   1200-1400 calories  Protein:  47 - 58 gm  Fluid:  >/= 1.2L  EDUCATION NEEDS:   Education needs no appropriate at this time  Satira Anis. Rainey Rodger, MS, RD LDN Inpatient Clinical Dietitian Pager [phone]

## 2016-06-14 NOTE — Progress Notes (Signed)
Pt's glucose recheck was 62mg /dl.  Pt given more juice, and she continues to eat dinner tray.  Pt states "I told you,that I would go the other way if you kept that insulin going." Pt alert and oriented, with NAD noted.  Continuing to monitor.

## 2016-06-14 NOTE — Progress Notes (Signed)
Pharmacy Consult for Warfarin Dosing and Electrolyte Monitoring Indication: atrial fibrillation, DKA on insulin drip  Allergies  Allergen Reactions  . Codeine Other (See Comments)    dizziness  . Contrast Media [Iodinated Diagnostic Agents] Other (See Comments)    Reddened skin - face and chest. Felt hot, burning sensation    Patient Measurements: Height: 5\' 4"  (162.6 cm) Weight: 105 lb (47.6 kg) IBW/kg (Calculated) : 54.7  Vital Signs: Temp: 98.6 F (37 C) (05/25 0300) Temp Source: Oral (05/25 0300) BP: 130/103 (05/25 1200) Pulse Rate: 59 (05/25 1200)  Labs:  Recent Labs  06/14/16 0009 06/14/16 0259 06/14/16 0716 06/14/16 0932 06/14/16 1241  HGB 14.2 13.1  --   --  12.5  HCT 42.6 39.1  --   --  35.8  PLT 297 246  --   --  274  LABPROT  --  38.6*  --   --   --   INR  --  3.82  --   --   --   CREATININE 1.28* 1.36* 1.04* 0.88 0.91    Estimated Creatinine Clearance: 45.7 mL/min (by C-G formula based on SCr of 0.91 mg/dL).  BMP Latest Ref Rng & Units 06/14/2016 06/14/2016 06/14/2016  Glucose 65 - 99 mg/dL 74 154(H) 280(H)  BUN 6 - 20 mg/dL 31(H) 34(H) 33(H)  Creatinine 0.44 - 1.00 mg/dL 0.91 0.88 1.04(H)  Sodium 135 - 145 mmol/L 135 134(L) 133(L)  Potassium 3.5 - 5.1 mmol/L 3.3(L) 3.0(L) 2.7(LL)  Chloride 101 - 111 mmol/L 103 106 104  CO2 22 - 32 mmol/L 25 24 21(L)  Calcium 8.9 - 10.3 mg/dL 9.0 9.1 8.7(L)   Magnesium (mg/dL)  Date Value  06/14/2016 1.7    Medical History: Past Medical History:  Diagnosis Date  . Anxiety   . Atrial fibrillation ([place])   . Depression   . Diabetes mellitus without complication (HCC)    Type I   . Headache    stress. 1x/month  . Hypertension   . Insulin pump in place   . Motion sickness    cars  . MVP (mitral valve prolapse)   . Neuromuscular disorder (HCC)    leg weakness s/p back surgeries  . Thyroid disease     Assessment: 66 y/o F with a h/o atrial fibrillation and DM admitted with DKA. Patient on warfarin 6 mg  daily PTA.   Goal of Therapy:  INR 2-3   Plan:  1. Will hold warfarin tonight and f/u AM INR.   2. Magnesium sulfate 2 g iv once and KCl 40 meq po x 2. Recheck K at 1800.   Ulice Dash D 06/14/2016,1:35 PM

## 2016-06-14 NOTE — Progress Notes (Signed)
      Inpatient Diabetes Program Recommendations  AACE/ADA: New Consensus Statement on Inpatient Glycemic Control (2015)  Target Ranges:  Prepandial:   less than 140 mg/dL      Peak postprandial:   less than 180 mg/dL (1-2 hours)      Critically ill patients:  140 - 180 mg/dL   Lab Results  Component Value Date   GLUCAP 256 (H) 06/14/2016   HGBA1C 8.5 12/30/2014    Review of Glycemic Control  Results for Colleen Payne, Colleen Payne (MRN [id number]) as of 06/14/2016 07:39  Ref. Range 06/14/2016 03:25 06/14/2016 04:40 06/14/2016 05:40 06/14/2016 06:42 06/14/2016 07:19  Glucose-Capillary Latest Ref Range: 65 - 99 mg/dL >600 (HH) 439 (H) 382 (H) 306 (H) 256 (H)     Diabetes history: Type 1 Outpatient Diabetes medications: Humalog in 670G insulin pump Current orders for Inpatient glycemic control: IV insulin  Inpatient Diabetes Program Recommendations:     When anion gap is closed and Co2 is >20, transition to patient's insulin pump.  Patient should call the 1-800 number on the back of the insulin pump to do a safety check before placing the pump back on. Patient must have all her supplies and insulin with her to transition to the pump. Please have patient sign the insulin pump contract and manage the flow sheet. Patient must use the hospital blood sugars and not use her sensor numbers to bolus her insulin.   Patient should put the insulin pump on 1 hour before the IV insulin infusion if turned off.   From Dr. Delorise Shiner note dated 04/29/16; Insulin pump settings. Uses 670 G insulin pump with Libre sensor   Basal settings 12 AM 0.15 units/hr 4 AM 0.30 units/hr 7 AM 0.25 units/hr  1 PM 1.00 6 PM 0.75 10PM 0.60  TDD 12.2 units  Bolus settings I:C ratio  12 AM 20  5 AM 16  11 AM 20  Sensitivity 70 BG target 12AM 130; 6AM 120-130; 10AM 130 Active insulin time = 4 hours Max bolus 10.0 u  BG average: 274  108 mg/dL.  Checks BG 4x/day Readings above target 86 %; below target 5% Avg  daily carbs 44 38 Avg TDD insulin 19.57  2.8 units (62% basal, 38% bolus)   Remainder of the settings were left unchanged. Off pump plan in case of pump malfunction:  Inject 12 units of Lantus once daily.  Inject humalog using the carbohydrate ratios above.   Gentry Fitz, RN, BA, MHA, CDE Diabetes Coordinator Inpatient Diabetes Program  [phone] (Team Pager) [phone] ([place]) 06/14/2016 7:49 AM

## 2016-06-14 NOTE — ED Triage Notes (Signed)
Pt arrives via [facility] with c/o of hyperglycemia. Pt reports CBG x2 at home > 600 and EMS received the same. Per EMS, pt has VS WDL. Pt suffers from chronic back pain and attributes her elevated blood sugar to stress. Pt is alert and oriented x4 at this time.

## 2016-06-14 NOTE — Progress Notes (Signed)
[place] NOTE  Pharmacy Consult for Electrolytes Indication: potassium    Allergies  Allergen Reactions  . Codeine Other (See Comments)    dizziness  . Contrast Media [Iodinated Diagnostic Agents] Other (See Comments)    Reddened skin - face and chest. Felt hot, burning sensation    Patient Measurements: Height: 5\' 4"  (162.6 cm) Weight: 105 lb (47.6 kg) IBW/kg (Calculated) : 54.7 Adjusted Body Weight:   Vital Signs: BP: 82/61 (05/25 1700) Pulse Rate: 74 (05/25 1700) Intake/Output from previous day: 05/24 0701 - 05/25 0700 In: 49.1 [I.V.:49.1] Out: 300 [Urine:300] Intake/Output from this shift: No intake/output data recorded. Vent settings for last 24 hours:    Labs:  Recent Labs  06/14/16 0009 06/14/16 0259 06/14/16 0716 06/14/16 0932 06/14/16 1241  WBC 13.6* 11.9*  --   --  16.1*  HGB 14.2 13.1  --   --  12.5  HCT 42.6 39.1  --   --  35.8  PLT 297 246  --   --  274  INR  --  3.82  --   --   --   CREATININE 1.28* 1.36* 1.04* 0.88 0.91  MG  --  1.7  --   --   --   PHOS  --  4.7*  --   --   --    Estimated Creatinine Clearance: 45.7 mL/min (by C-G formula based on SCr of 0.91 mg/dL).   Recent Labs  06/14/16 1648 06/14/16 1704 06/14/16 1807  GLUCAP 39* 62* 109*    Microbiology: Recent Results (from the past 720 hour(s))  MRSA PCR Screening     Status: None   Collection Time: 06/14/16  2:16 AM  Result Value Ref Range Status   MRSA by PCR NEGATIVE NEGATIVE Final    Comment:        The GeneXpert MRSA Assay (FDA approved for NASAL specimens only), is one component of a comprehensive MRSA colonization surveillance program. It is not intended to diagnose MRSA infection nor to guide or monitor treatment for MRSA infections.     Medications:  Prescriptions Prior to Admission  Medication Sig Dispense Refill Last Dose  . amitriptyline (ELAVIL) 25 MG tablet Take 25 mg by mouth at bedtime.    06/13/2016 at 2000  . HUMALOG 100  UNIT/ML injection INJECT 1ML (100 UNITS) INTO THE SKIN DAILY. FOR USE IN PUMP 30 mL 2 06/12/2016 at 0800  . hydrochlorothiazide (HYDRODIURIL) 25 MG tablet Take 25 mg by mouth daily.    06/13/2016 at 0800  . levothyroxine (SYNTHROID, LEVOTHROID) 25 MCG tablet Take 25 mcg by mouth daily before breakfast.    06/13/2016 at 0800  . metoprolol succinate (TOPROL-XL) 50 MG 24 hr tablet Take 150 mg by mouth daily with breakfast. 1 tablet with dinner.   Unknown at Unknown  . morphine (MSIR) 15 MG tablet Take 15 mg by mouth every 6 (six) hours as needed for moderate pain or severe pain.    06/12/2016 at Unknown  . ramipril (ALTACE) 10 MG capsule Take 10 mg by mouth daily with breakfast.    06/13/2016 at 0800  . warfarin (COUMADIN) 6 MG tablet Take 6 mg by mouth daily with breakfast.    Unknown at Unknown  . Buprenorphine (BUTRANS) 15 MCG/HR PTWK Place 15 mcg onto the skin once a week.    Not Taking at Unknown  . glucose blood (ONE TOUCH ULTRA TEST) test strip USE TO TEST BLOOD SUGAR 8 TIMES DAILY. DX: E10.42 300  each 2   . insulin aspart (NOVOLOG) 100 UNIT/ML injection Use 100 units daily via insulin pump. (Patient not taking: Reported on 06/14/2016) 30 mL 2 Completed Course at Unknown time    Assessment: 5/25:  K @ 18:11 = 3.5   Goal of Therapy:  Normalization of electrolytes   Plan:  No additional supplementation needed at this time, will recheck electrolytes on 5/26 with AM labs.   Rahmah Mccamy D 06/14/2016,7:09 PM

## 2016-06-14 NOTE — Progress Notes (Signed)
Met with patient and have reviewed insulin pump settings- they are as listed below.  She put a new site on today on her lower right abdomen.  Patient was able to demonstrate the process for putting in a blood sugar number and her carbohydrates.  I believe she is safe to manage the insulin pump although she currently is very tired and needed to be  encouraged to stay awake.   Basal settings 12 AM 0.15 units/hr 4 AM 0.30 units/hr 7 AM 0.25 units/hr  1 PM 1.00 6 PM 0.75 10PM 0.60  TDD 12.2 units  Bolus settings I:C ratio  12 AM 20  5 AM 16  11 AM 20  Sensitivity 70 BG target 12AM 130; 6AM 120-130; 10AM 130 Active insulin time = 4 hours Max bolus 10.0 u  Consent signed and insulin flow sheet at the bedside.  Staff need to be reminded to complete the insulin pump flow sheet each shift. A opened bottle of Humalog insulin was secured in the locked P[facility] by RN Burke Keels, RN, BA, MHA, CDE Diabetes Coordinator Inpatient Diabetes Program  [phone] (Team Pager) [phone] ([place]) 06/14/2016 3:05 PM

## 2016-06-14 NOTE — Consult Note (Signed)
PULMONARY / CRITICAL CARE MEDICINE   Name: Colleen Payne MRN: [id number] DOB: 1950-04-18    ADMISSION DATE:  06/13/2016   CONSULTATION DATE:  06/14/2016  REFERRING MD:  [place]  Reason: ICU management of DKA  CHIEF COMPLAINT:  Hypoglycemia  HISTORY OF PRESENT ILLNESS:   66 year old female with a known history of type 2 diabetes presenting with a blood sugar greater than 600. Labs in the ED showed a blood glucose level of 671 mg but 60 to an anion gap of 20, and a CO2 level of 17. Patient was started on an insulin infusion and transferred to the ICU for further management She has a history of atrial fibrillation and currently on Coumadin. Last INR is 3.82. She is awake and complaining of back pain. This is not a new complaint for patient. She is on morphine at home PAST MEDICAL HISTORY :  She  has a past medical history of Anxiety; Atrial fibrillation ([place]); Depression; Diabetes mellitus without complication ([place]); Headache; Hypertension; Insulin pump in place; Motion sickness; MVP (mitral valve prolapse); Neuromuscular disorder ([place]); and Thyroid disease.  PAST SURGICAL HISTORY: She  has a past surgical history that includes Back surgery (2004) and Cataract extraction w/PHACO (Left, 11/23/2014).  Allergies  Allergen Reactions  . Codeine Other (See Comments)    dizziness  . Contrast Media [Iodinated Diagnostic Agents] Other (See Comments)    Reddened skin - face and chest. Felt hot, burning sensation    No current facility-administered medications on file prior to encounter.    Current Outpatient Prescriptions on File Prior to Encounter  Medication Sig  . ALPRAZolam (XANAX) 0.25 MG tablet 0.25 mg as needed.   Marland Kitchen amitriptyline (ELAVIL) 25 MG tablet Take 25 mg by mouth at bedtime.   . Buprenorphine (BUTRANS) 15 MCG/HR PTWK Place onto the skin.  . Cholecalciferol (PA VITAMIN D-3) 2000 UNITS CAPS Take 1 capsule by mouth daily.   . citalopram (CELEXA) 20 MG tablet Take 20 mg by  mouth daily.   Marland Kitchen estrogen, conjugated,-medroxyprogesterone (PREMPRO) 0.3-1.5 MG per tablet Take one tablet by mouth daily.  Marland Kitchen gabapentin (NEURONTIN) 300 MG capsule Take 2,400 mg by mouth daily.   Marland Kitchen glucose blood (ONE TOUCH ULTRA TEST) test strip USE TO TEST BLOOD SUGAR 8 TIMES DAILY. DX: E10.42  . HUMALOG 100 UNIT/ML injection INJECT 1ML (100 UNITS) INTO THE SKIN DAILY. FOR USE IN PUMP  . hydrochlorothiazide (HYDRODIURIL) 25 MG tablet Take 25 mg by mouth daily.   . insulin aspart (NOVOLOG) 100 UNIT/ML injection Use 100 units daily via insulin pump.  Marland Kitchen levothyroxine (SYNTHROID, LEVOTHROID) 25 MCG tablet Take 25 mcg by mouth daily before breakfast.   . lipase/protease/amylase (CREON) 12000 UNITS CPEP capsule Take 12,000 Units by mouth.   . metoprolol succinate (TOPROL-XL) 25 MG 24 hr tablet Take 50 mg by mouth daily with breakfast. 1 tablet with dinner.  . morphine (MSIR) 15 MG tablet Take by mouth.  . norethindrone-ethinyl estradiol (FEMHRT 1/5) 1-5 MG-MCG TABS Take by mouth.  . Oxycodone HCl 10 MG TABS Take 10 mg by mouth 4 (four) times daily.   . ramipril (ALTACE) 10 MG capsule Take 10 mg by mouth daily with breakfast.   . vitamin B-12 (CYANOCOBALAMIN) 1000 MCG tablet Take 1,000 mcg by mouth daily.   . vitamin E 400 UNIT capsule Take 400 Units by mouth daily.   Marland Kitchen warfarin (COUMADIN) 2.5 MG tablet Take 2.5 mg by mouth daily with breakfast.   . warfarin (COUMADIN) 5 MG tablet Take  5 mg by mouth daily with breakfast.     FAMILY HISTORY:  Her indicated that her mother is deceased. She indicated that her father is deceased.    SOCIAL HISTORY: She  reports that she has quit smoking. She started smoking about 14 years ago. She has never used smokeless tobacco. She reports that she does not drink alcohol or use drugs.  REVIEW OF SYSTEMS:   Constitutional: Reports generalized malaise and weakness.  HENT: Negative for congestion and rhinorrhea.  Eyes: Negative for redness and visual  disturbance.  Respiratory: Negative for shortness of breath and wheezing.  Cardiovascular: Negative for chest pain and palpitations.  Gastrointestinal: Negative  for nausea , vomiting and abdominal pain and  Loose stools Genitourinary: Negative for dysuria and urgency.  Endocrine: Denies polyuria, polyphagia and heat intolerance Musculoskeletal: Positive for back pain.  Skin: Negative for pallor and wound.  Neurological: Negative for dizziness and headaches   SUBJECTIVE:   VITAL SIGNS: BP 135/66 (BP Location: Right Arm)   Pulse 78   Temp 98.3 F (36.8 C) (Oral)   Resp 14   Ht 5\' 4"  (1.626 m)   Wt 105 lb (47.6 kg)   SpO2 100%   BMI 18.02 kg/m   HEMODYNAMICS:    VENTILATOR SETTINGS:    INTAKE / OUTPUT: No intake/output data recorded.  PHYSICAL EXAMINATION: General:  No acute distress Neuro: Somnolent, most all extremities, responds to questions appropriately HEENT: PERRLA, trachea midline, oral mucosa dry Cardiovascular: Rate and rhythm regular, S1, S2 audible, no murmur, regurg or gallop, no JVD, no edema Lungs: Clear to auscultation bilaterally Abdomen:  Positive bowel sounds in all 4 quadrants Musculoskeletal: No visible deformities, no joint swelling Skin: Warm and dry  LABS:  BMET  Recent Labs Lab 06/14/16 0009  NA 128*  K 4.9  CL 91*  CO2 17*  BUN 33*  CREATININE 1.28*  GLUCOSE 671*    Electrolytes  Recent Labs Lab 06/14/16 0009  CALCIUM 10.2    CBC  Recent Labs Lab 06/14/16 0009  WBC 13.6*  HGB 14.2  HCT 42.6  PLT 297    Coag's No results for input(s): APTT, INR in the last 168 hours.  Sepsis Markers No results for input(s): LATICACIDVEN, PROCALCITON, O2SATVEN in the last 168 hours.  ABG No results for input(s): PHART, PCO2ART, PO2ART in the last 168 hours.  Liver Enzymes No results for input(s): AST, ALT, ALKPHOS, BILITOT, ALBUMIN in the last 168 hours.  Cardiac Enzymes No results for input(s): TROPONINI, PROBNP in the  last 168 hours.  Glucose  Recent Labs Lab 06/14/16 0007 06/14/16 0144  GLUCAP >600* >600*    Imaging Dg Chest Portable 1 View  Result Date: 06/14/2016 CLINICAL DATA:  Weakness and hyperglycemia. EXAM: PORTABLE CHEST 1 VIEW COMPARISON:  07/31/2010 FINDINGS: Normal heart size and pulmonary vascularity. Diffuse emphysematous changes and scattered fibrosis in the lungs. No focal consolidation or airspace disease. No blunting of costophrenic angles. No pneumothorax. Thoracolumbar scoliosis with the proximal convexity towards the right and distal convexity towards the left. Postoperative changes in the lumbar spine. IMPRESSION: Emphysematous changes in the lungs. No evidence of active pulmonary disease. Electronically Signed   By: Lucienne Capers M.D.   On: 06/14/2016 00:53     STUDIES:  None  CULTURES: None  ANTIBIOTICS: None  SIGNIFICANT EVENTS: 06/14/2016: ED with severe hyperglycemia  LINES/TUBES: Peripheral IVs  DISCUSSION: 66 year old Caucasian female with a history of type 1 diabetes presenting with DKA likely secondary to medication nonadherence  ASSESSMENT  Diabetic ketoacidosis. Atrial fibrillation-rate controlled History of depression and anxiety. History of hypertension History of hypothyroidism  PLAN Insulin infusion per protocol Monitor and replace electrolytes IV fluids INR is moderately supratherapeutic, hold today's dose and resume dosing per pharmacy. Hemodynamic monitoring per ICU protocol. Resume home antihypertensive and thyroid medications. Resume home pain medications. GI and DVT prophylaxis-patient is already on full strength anticoagulation and does not require GI prophylaxis as she is able to tolerate oral intake   FAMILY  - Updates: No family at bedside. We'll updated when available  - Inter-disciplinary family meet or Palliative Care meeting due by:  day 7  Plan of care discussed with a [facility] physician and Dr. Marylouise Stacks.  [facility] ANP-BC Pulmonary and Critical Care Medicine [facility] Pager [phone] or [phone]  06/14/2016, 2:19 AM

## 2016-06-14 NOTE — Progress Notes (Signed)
PT last FSBS 109mg /dl.  No meal coverage given at this time.  PT remains sleepy.  She reports not resting well over the last few nights taking care of her husband.  Vital signs stable.  Hinton Dyer NP aware of hypoglycemic episode and reports to keep patient in ICU for now while continuing to monitor for rebound hypoglycemia.

## 2016-06-14 NOTE — Care Management Note (Signed)
Case Management Note  Patient Details  Name: Colleen Payne MRN: [id number] Date of Birth: August 10, 1950  Subjective/Objective:                  RNCM met with patient to discuss discharge planning. She states that she lives with her husband however he "is not fit to help her in any way".  He is currently at [facility] for rehab where she has been staying per his request. They have a daughter and son but Marykay Lex both work. Patient states her husband depends on her a lot and that she just hasn't been caring for herself like she should. She has never used home health services or been to rehab.  She believes that her husband has used [facility] home health in the past (a couple of times) but can't swear on that and would like to talk to her daughter before deciding on home health agency. She has a cane, bedside commode (sister is borrowing) and shower bench in the home. She denies having a walker. She is not requiring O2 at this time. Her PCP is Dr. Frazier Richards. She states she has a working glucometer and can afford her medications. She states she "can't walk" and may need to "go to [employer]  At baseline, patient is able to walk independently and drive.   Action/Plan: List of home health agencies provided. Discussed with patient's nurse regarding PT evaluation when appropriate. RNCM to continue to follow. CSW updated.   Expected Discharge Date:                  Expected Discharge Plan:     In-House Referral:     Discharge planning Services  CM Consult  Post Acute Care Choice:  Home Health, Durable Medical Equipment Choice offered to:  Patient  DME Arranged:    DME Agency:     HH Arranged:    [place] Agency:     Status of Service:  In process, will continue to follow  If discussed at Long Length of Stay Meetings, dates discussed:    Additional Comments:  Marshell Garfinkel, RN 06/14/2016, 1:03 PM

## 2016-06-14 NOTE — Progress Notes (Signed)
This is a 66 y.o. female with a history of IDDM with pump, pancreatic insufficiency atrial fibrillation on coumadin, depression, HTN, hypothyroidism, obstructive sleep apnea. She is admitted for DKA.  VS is stable. Physical examination is unremarkable.  #. DKA, improved. AG is normal, discontinue insulin drip after starting insulin pump -IV fluids and electrolyte management per protocol -Continue Creon  #. Acute kidney injury and hyponatremia secondary to dehydration. Baseline creatinine is 0.8 now up to 1.28 Improved with IV fluids.  Hypokalemia. Given potassium supplement and follow-up BMP.  #. History of atrial fibrillation -Continue metoprolol, hydrochlorothiazide -Check PT/INR -Coumadin per pharmacy  #. History of chronic pain and radiculopathy - Continue continue home medication regimen  #. History of hypothyroidism - Continue Synthroid  #. History of depression, anxiety - Continue Celexa, amitriptyline, Xanax  I discussed with the patient, her daughter and RN. Time spent 32 minutes.

## 2016-06-14 NOTE — Progress Notes (Signed)
Pt blood pressures becoming soft, MAPs in 50s. NP informed, verbal order given for 1L bolus. Will continue to monitor.

## 2016-06-14 NOTE — Progress Notes (Signed)
Met with patient and her daughter.  Patient somewhat frustrated because she feels like when she came to the emergency room, "they would have just given me an injection of insulin".  Not clear why she didn't do this herself because she knew that when the blood sugars are not coming down through the pump, she is supposed to manually give herself insulin.   She is struggling to keep her eyes open as we are speaking- she complains of being excessively tired and the stress of her husband is part of the tiredness and the elevated sugars.  Daughter was on her way home to get a new insertion set so the patient could change her site.  Daughter is going to call the pump company and complete the safety check on the pump before allow the patient to put the pump back on.    Gentry Fitz, RN, BA, MHA, CDE Diabetes Coordinator Inpatient Diabetes Program  [phone] (Team Pager) [phone] ([place]) 06/14/2016 12:57 PM

## 2016-06-15 LAB — PROTIME-INR
INR: 5.43 — AB
INR: 4.29
PROTHROMBIN TIME: 51.1 s — AB (ref 11.4–15.2)
Prothrombin Time: 42.3 seconds — ABNORMAL HIGH (ref 11.4–15.2)

## 2016-06-15 LAB — GLUCOSE, CAPILLARY
GLUCOSE-CAPILLARY: 83 mg/dL (ref 65–99)
Glucose-Capillary: 72 mg/dL (ref 65–99)

## 2016-06-15 LAB — URINE DRUG SCREEN, QUALITATIVE (ARMC ONLY)
Amphetamines, Ur Screen: NOT DETECTED
BARBITURATES, UR SCREEN: NOT DETECTED
Benzodiazepine, Ur Scrn: NOT DETECTED
CANNABINOID 50 NG, UR ~~LOC~~: NOT DETECTED
COCAINE METABOLITE, UR ~~LOC~~: NOT DETECTED
MDMA (Ecstasy)Ur Screen: NOT DETECTED
METHADONE SCREEN, URINE: NOT DETECTED
OPIATE, UR SCREEN: POSITIVE — AB
Phencyclidine (PCP) Ur S: NOT DETECTED
Tricyclic, Ur Screen: NOT DETECTED

## 2016-06-15 LAB — BASIC METABOLIC PANEL
ANION GAP: 5 (ref 5–15)
BUN: 33 mg/dL — ABNORMAL HIGH (ref 6–20)
CALCIUM: 8.7 mg/dL — AB (ref 8.9–10.3)
CO2: 25 mmol/L (ref 22–32)
Chloride: 104 mmol/L (ref 101–111)
Creatinine, Ser: 0.84 mg/dL (ref 0.44–1.00)
Glucose, Bld: 101 mg/dL — ABNORMAL HIGH (ref 65–99)
Potassium: 3.8 mmol/L (ref 3.5–5.1)
SODIUM: 134 mmol/L — AB (ref 135–145)

## 2016-06-15 LAB — MAGNESIUM: MAGNESIUM: 1.8 mg/dL (ref 1.7–2.4)

## 2016-06-15 MED ORDER — WARFARIN - PHARMACIST DOSING INPATIENT: Freq: Every day | Status: DC

## 2016-06-15 MED ORDER — MORPHINE SULFATE 15 MG PO TABS
15.0000 mg | ORAL_TABLET | Freq: Four times a day (QID) | ORAL | Status: DC
  Administered 2016-06-15: 15 mg via ORAL
  Filled 2016-06-15: qty 1

## 2016-06-15 NOTE — Progress Notes (Signed)
Critical INR called to M. Patria Mane, NP.  Will recheck at  8am.  Also informed that patient has gone into Afib.  Heart rate in 120's.  Acknowledged, will monitor for now.

## 2016-06-15 NOTE — Progress Notes (Signed)
[place] NOTE  Pharmacy Consult for Electrolytes Indication: potassium    Allergies  Allergen Reactions  . Codeine Other (See Comments)    dizziness  . Contrast Media [Iodinated Diagnostic Agents] Other (See Comments)    Reddened skin - face and chest. Felt hot, burning sensation    Patient Measurements: Height: 5\' 4"  (162.6 cm) Weight: 105 lb (47.6 kg) IBW/kg (Calculated) : 54.7 Adjusted Body Weight:   Vital Signs: Temp: 98 F (36.7 C) (05/26 0403) Temp Source: Oral (05/26 0403) BP: 106/52 (05/26 0500) Pulse Rate: 61 (05/26 0500) Intake/Output from previous day: 05/25 0701 - 05/26 0700 In: 480 [P.O.:480] Out: 400 [Urine:400] Intake/Output from this shift: Total I/O In: 480 [P.O.:480] Out: 400 [Urine:400] Vent settings for last 24 hours:    Labs:  Recent Labs  06/14/16 0009 06/14/16 0259  06/14/16 0932 06/14/16 1241 06/15/16 0249  WBC 13.6* 11.9*  --   --  16.1*  --   HGB 14.2 13.1  --   --  12.5  --   HCT 42.6 39.1  --   --  35.8  --   PLT 297 246  --   --  274  --   INR  --  3.82  --   --   --  5.43*  CREATININE 1.28* 1.36*  < > 0.88 0.91 0.84  MG  --  1.7  --   --   --  1.8  PHOS  --  4.7*  --   --   --   --   < > = values in this interval not displayed. Estimated Creatinine Clearance: 49.5 mL/min (by C-G formula based on SCr of 0.84 mg/dL).   Recent Labs  06/14/16 1935 06/14/16 2158 06/15/16 0202  GLUCAP 111* 79 44    Microbiology: Recent Results (from the past 720 hour(s))  MRSA PCR Screening     Status: None   Collection Time: 06/14/16  2:16 AM  Result Value Ref Range Status   MRSA by PCR NEGATIVE NEGATIVE Final    Comment:        The GeneXpert MRSA Assay (FDA approved for NASAL specimens only), is one component of a comprehensive MRSA colonization surveillance program. It is not intended to diagnose MRSA infection nor to guide or monitor treatment for MRSA infections.     Medications:  Prescriptions  Prior to Admission  Medication Sig Dispense Refill Last Dose  . amitriptyline (ELAVIL) 25 MG tablet Take 25 mg by mouth at bedtime.    06/13/2016 at 2000  . HUMALOG 100 UNIT/ML injection INJECT 1ML (100 UNITS) INTO THE SKIN DAILY. FOR USE IN PUMP 30 mL 2 06/12/2016 at 0800  . hydrochlorothiazide (HYDRODIURIL) 25 MG tablet Take 25 mg by mouth daily.    06/13/2016 at 0800  . levothyroxine (SYNTHROID, LEVOTHROID) 25 MCG tablet Take 25 mcg by mouth daily before breakfast.    06/13/2016 at 0800  . metoprolol succinate (TOPROL-XL) 50 MG 24 hr tablet Take 150 mg by mouth daily with breakfast. 1 tablet with dinner.   Unknown at Unknown  . morphine (MSIR) 15 MG tablet Take 15 mg by mouth every 6 (six) hours as needed for moderate pain or severe pain.    06/12/2016 at Unknown  . ramipril (ALTACE) 10 MG capsule Take 10 mg by mouth daily with breakfast.    06/13/2016 at 0800  . warfarin (COUMADIN) 6 MG tablet Take 6 mg by mouth daily with breakfast.    Unknown at  Unknown  . Buprenorphine (BUTRANS) 15 MCG/HR PTWK Place 15 mcg onto the skin once a week.    Not Taking at Unknown  . glucose blood (ONE TOUCH ULTRA TEST) test strip USE TO TEST BLOOD SUGAR 8 TIMES DAILY. DX: E10.42 300 each 2   . insulin aspart (NOVOLOG) 100 UNIT/ML injection Use 100 units daily via insulin pump. (Patient not taking: Reported on 06/14/2016) 30 mL 2 Completed Course at Unknown time    Assessment: 5/25:  K @ 18:11 = 3.5   Goal of Therapy:  Normalization of electrolytes   Plan:  No additional supplementation needed at this time, will recheck electrolytes on 5/26 with AM labs.   5/26 @ 0245 K 3.8 no further supplementation needed at this time. Will f/u w/ next BMP w/ am labs 5/27.  Tobie Lords 06/15/2016,5:34 AM

## 2016-06-15 NOTE — Progress Notes (Signed)
Pharmacy Consult for Warfarin Dosing and Electrolyte Monitoring Indication: atrial fibrillation, DKA on insulin drip  Allergies  Allergen Reactions  . Codeine Other (See Comments)    dizziness  . Contrast Media [Iodinated Diagnostic Agents] Other (See Comments)    Reddened skin - face and chest. Felt hot, burning sensation    Patient Measurements: Height: 5\' 4"  (162.6 cm) Weight: 105 lb (47.6 kg) IBW/kg (Calculated) : 54.7  Vital Signs: Temp: 97.5 F (36.4 C) (05/26 0835) Temp Source: Oral (05/26 0835) BP: 123/55 (05/26 0835) Pulse Rate: 61 (05/26 0835)  Labs:  Recent Labs  06/14/16 0009 06/14/16 0259  06/14/16 0932 06/14/16 1241 06/15/16 0249 06/15/16 0803  HGB 14.2 13.1  --   --  12.5  --   --   HCT 42.6 39.1  --   --  35.8  --   --   PLT 297 246  --   --  274  --   --   LABPROT  --  38.6*  --   --   --  51.1* 42.3*  INR  --  3.82  --   --   --  5.43* 4.29*  CREATININE 1.28* 1.36*  < > 0.88 0.91 0.84  --   < > = values in this interval not displayed.  Estimated Creatinine Clearance: 49.5 mL/min (by C-G formula based on SCr of 0.84 mg/dL).  BMP Latest Ref Rng & Units 06/15/2016 06/14/2016 06/14/2016  Glucose 65 - 99 mg/dL 101(H) - 74  BUN 6 - 20 mg/dL 33(H) - 31(H)  Creatinine 0.44 - 1.00 mg/dL 0.84 - 0.91  Sodium 135 - 145 mmol/L 134(L) - 135  Potassium 3.5 - 5.1 mmol/L 3.8 3.5 3.3(L)  Chloride 101 - 111 mmol/L 104 - 103  CO2 22 - 32 mmol/L 25 - 25  Calcium 8.9 - 10.3 mg/dL 8.7(L) - 9.0   Magnesium (mg/dL)  Date Value  06/15/2016 1.8    Medical History: Past Medical History:  Diagnosis Date  . Anxiety   . Atrial fibrillation ([place])   . Depression   . Diabetes mellitus without complication ([facility])    Type I   . Headache    stress. 1x/month  . Hypertension   . Insulin pump in place   . Motion sickness    cars  . MVP (mitral valve prolapse)   . Neuromuscular disorder ([facility])    leg weakness s/p back surgeries  . Thyroid disease     Assessment: 66  y/o F with a h/o atrial fibrillation and DM admitted with DKA. Patient on warfarin 6 mg daily PTA.   5/25  INR 3.82   Warfarin HELD 5/26  INR 4.29  Goal of Therapy:  INR 2-3   Plan:  1. Will hold warfarin again tonight and f/u AM INR.   2. 5/26 @ 0245 K 3.8 no further supplementation needed at this time. Will f/u w/ next BMP w/ am labs 5/27.  Wake Conlee A 06/15/2016,9:59 AM

## 2016-06-15 NOTE — Care Management Note (Signed)
Case Management Note  Patient Details  Name: Colleen Payne MRN: [id number] Date of Birth: 02/25/1950  Subjective/Objective:     Discussed discharge planning with [name]. She reports that she has a RW at home and that she intends to return to [facility] after she goes home today. Husband is at [facility] and she has been staying with him at [facility].                Action/Plan:   Expected Discharge Date:  06/15/16               Expected Discharge Plan:     In-House Referral:     Discharge planning Services  CM Consult  Post Acute Care Choice:  Home Health, Durable Medical Equipment Choice offered to:  Patient  DME Arranged:    DME Agency:     HH Arranged:    [place] Agency:     Status of Service:  In process, will continue to follow  If discussed at Long Length of Stay Meetings, dates discussed:    Additional Comments:  Denzal Meir A, RN 06/15/2016, 11:30 AM

## 2016-06-15 NOTE — Discharge Summary (Signed)
[facility] NAME: Colleen Payne    MR#:  [id number]  DATE OF BIRTH:  [birth year]  DATE OF ADMISSION:  06/13/2016   ADMITTING PHYSICIAN: Harvie Bridge, DO  DATE OF DISCHARGE: 06/15/2016  PRIMARY CARE PHYSICIAN: Kirk Ruths, MD   ADMISSION DIAGNOSIS:  Type 1 diabetes mellitus with ketoacidosis without coma ([place]) [E10.10] DISCHARGE DIAGNOSIS:  Active Problems:   DKA (diabetic ketoacidoses) ([place]) ARF SECONDARY DIAGNOSIS:   Past Medical History:  Diagnosis Date  . Anxiety   . Atrial fibrillation ([place])   . Depression   . Diabetes mellitus without complication (HCC)    Type I   . Headache    stress. 1x/month  . Hypertension   . Insulin pump in place   . Motion sickness    cars  . MVP (mitral valve prolapse)   . Neuromuscular disorder (HCC)    leg weakness s/p back surgeries  . Thyroid disease    HOSPITAL COURSE:   This is a 66 y.o.femalewith a history of IDDM with pump, pancreatic insufficiency atrial fibrillation on coumadin, depression, HTN, hypothyroidism, obstructive sleep apnea. She is admitted for DKA.  #. DKA,  improved with insulin drip, on insulin pump, BS is controlled.  #. Acute kidney injury and hyponatremia secondary to dehydration. Baseline creatinine is 0.8 now up to 1.28 Improved with IV fluids.  Hypokalemia. Given potassium supplement and improved.  #. History of atrial fibrillation -Continue metoprolol, hydrochlorothiazide INR is 4.29, no active bleeding. Hold the Coumadin today and resume tomorrow. Follow-up INR with PCP in 2-3 days.   #. History of chronic pain and radiculopathy - Continue continue home medication regimen  #. History of hypothyroidism - Continue Synthroid  #. History of depression, anxiety - Continue Celexa, amitriptyline, Xanax DISCHARGE CONDITIONS:  Stable, discharge to home today. CONSULTS OBTAINED:   DRUG ALLERGIES:   Allergies  Allergen  Reactions  . Codeine Other (See Comments)    dizziness  . Contrast Media [Iodinated Diagnostic Agents] Other (See Comments)    Reddened skin - face and chest. Felt hot, burning sensation   DISCHARGE MEDICATIONS:   Allergies as of 06/15/2016      Reactions   Codeine Other (See Comments)   dizziness   Contrast Media [iodinated Diagnostic Agents] Other (See Comments)   Reddened skin - face and chest. Felt hot, burning sensation      Medication List    TAKE these medications   amitriptyline 25 MG tablet Commonly known as:  ELAVIL Take 25 mg by mouth at bedtime.   BUTRANS 15 MCG/HR Ptwk Generic drug:  Buprenorphine Place 15 mcg onto the skin once a week.   glucose blood test strip Commonly known as:  ONE TOUCH ULTRA TEST USE TO TEST BLOOD SUGAR 8 TIMES DAILY. DX: E10.42   HUMALOG 100 UNIT/ML injection Generic drug:  insulin lispro INJECT 1ML (100 UNITS) INTO THE SKIN DAILY. FOR USE IN PUMP   hydrochlorothiazide 25 MG tablet Commonly known as:  HYDRODIURIL Take 25 mg by mouth daily.   insulin aspart 100 UNIT/ML injection Commonly known as:  NOVOLOG Use 100 units daily via insulin pump.   levothyroxine 25 MCG tablet Commonly known as:  SYNTHROID, LEVOTHROID Take 25 mcg by mouth daily before breakfast.   metoprolol succinate 50 MG 24 hr tablet Commonly known as:  TOPROL-XL Take 150 mg by mouth daily with breakfast. 1 tablet with dinner.   morphine 15 MG tablet Commonly known as:  MSIR  Take 15 mg by mouth every 6 (six) hours as needed for moderate pain or severe pain.   ramipril 10 MG capsule Commonly known as:  ALTACE Take 10 mg by mouth daily with breakfast.   warfarin 6 MG tablet Commonly known as:  COUMADIN Take 6 mg by mouth daily with breakfast.        DISCHARGE INSTRUCTIONS:  See AVS.  If you experience worsening of your admission symptoms, develop shortness of breath, life threatening emergency, suicidal or homicidal thoughts you must seek medical  attention immediately by calling 911 or calling your MD immediately  if symptoms less severe.  You Must read complete instructions/literature along with all the possible adverse reactions/side effects for all the Medicines you take and that have been prescribed to you. Take any new Medicines after you have completely understood and accpet all the possible adverse reactions/side effects.   Please note  You were cared for by a hospitalist during your hospital stay. If you have any questions about your discharge medications or the care you received while you were in the hospital after you are discharged, you can call the unit and asked to speak with the hospitalist on call if the hospitalist that took care of you is not available. Once you are discharged, your primary care physician will handle any further medical issues. Please note that NO REFILLS for any discharge medications will be authorized once you are discharged, as it is imperative that you return to your primary care physician (or establish a relationship with a primary care physician if you do not have one) for your aftercare needs so that they can reassess your need for medications and monitor your lab values.    On the day of Discharge:  VITAL SIGNS:  Blood pressure (!) 123/55, pulse 61, temperature 97.5 F (36.4 C), temperature source Oral, resp. rate 12, height 5\' 4"  (1.626 m), weight 105 lb (47.6 kg), SpO2 96 %. PHYSICAL EXAMINATION:  GENERAL:  66 y.o.-year-old patient lying in the bed with no acute distress.  EYES: Pupils equal, round, reactive to light and accommodation. No scleral icterus. Extraocular muscles intact.  HEENT: Head atraumatic, normocephalic. Oropharynx and nasopharynx clear.  NECK:  Supple, no jugular venous distention. No thyroid enlargement, no tenderness.  LUNGS: Normal breath sounds bilaterally, no wheezing, rales,rhonchi or crepitation. No use of accessory muscles of respiration.  CARDIOVASCULAR: S1, S2  normal. No murmurs, rubs, or gallops.  ABDOMEN: Soft, non-tender, non-distended. Bowel sounds present. No organomegaly or mass.  EXTREMITIES: No pedal edema, cyanosis, or clubbing.  NEUROLOGIC: Cranial nerves II through XII are intact. Muscle strength 5/5 in all extremities. Sensation intact. Gait not checked.  PSYCHIATRIC: The patient is alert and oriented x 3.  SKIN: No obvious rash, lesion, or ulcer.  DATA REVIEW:   CBC  Recent Labs Lab 06/14/16 1241  WBC 16.1*  HGB 12.5  HCT 35.8  PLT 274    Chemistries   Recent Labs Lab 06/15/16 0249  NA 134*  K 3.8  CL 104  CO2 25  GLUCOSE 101*  BUN 33*  CREATININE 0.84  CALCIUM 8.7*  MG 1.8     Microbiology Results  Results for orders placed or performed during the hospital encounter of 06/13/16  MRSA PCR Screening     Status: None   Collection Time: 06/14/16  2:16 AM  Result Value Ref Range Status   MRSA by PCR NEGATIVE NEGATIVE Final    Comment:        The GeneXpert  MRSA Assay (FDA approved for NASAL specimens only), is one component of a comprehensive MRSA colonization surveillance program. It is not intended to diagnose MRSA infection nor to guide or monitor treatment for MRSA infections.     RADIOLOGY:  No results found.   Management plans discussed with the patient, family and they are in agreement.  CODE STATUS: Prior   TOTAL TIME TAKING CARE OF THIS PATIENT: 32 minutes.    Demetrios Loll M.D on 06/15/2016 at 1:13 PM  Between 7am to 6pm - Pager - [phone]  After 6pm go to [email or web address] - [employer] [facility]  Office  [phone]  CC: Primary care physician; Kirk Ruths, MD   Note: This dictation was prepared with Dragon dictation along with smaller phrase technology. Any transcriptional errors that result from this process are unintentional.

## 2016-06-15 NOTE — Progress Notes (Signed)
PULMONARY / CRITICAL CARE MEDICINE   Name: Colleen Payne MRN: [id number] DOB: 1950-11-29    ADMISSION DATE:  06/13/2016   CONSULTATION DATE:  06/14/2016  REFERRING MD:  [place]  Reason: ICU management of DKA  CHIEF COMPLAINT:  Hypoglycemia  HISTORY OF PRESENT ILLNESS:   66 year old female with a known history of type 2 diabetes presenting with a blood sugar greater than 600. Labs in the ED showed a blood glucose level of 671 mg but 60 to an anion gap of 20, and a CO2 level of 17. Patient was started on an insulin infusion and transferred to the ICU for further management She has a history of atrial fibrillation and currently on Coumadin. Last INR is 3.82. She is awake and complaining of back pain. This is not a new complaint for patient. She is on morphine at home  SUBJECTIVE: No acute issues overnight. Did not want to be bordered. Indicated that she wanted to sleep because she hasn't slept for 3 weeks because she takes care of her husband who is in rehab.  REVIEW OF SYSTEMS:   Constitutional: Reports generalized malaise and weakness.  HENT: Negative for congestion and rhinorrhea.  Eyes: Negative for redness and visual disturbance.  Respiratory: Negative for shortness of breath and wheezing.  Cardiovascular: Negative for chest pain and palpitations.  Gastrointestinal: Negative  for nausea , vomiting and abdominal pain and  Loose stools Genitourinary: Negative for dysuria and urgency.  Endocrine: Denies polyuria, polyphagia and heat intolerance Musculoskeletal: Positive for back pain.  Skin: Negative for pallor and wound.  Neurological: Negative for dizziness and headaches     VITAL SIGNS: BP (!) 99/54   Pulse (!) 54   Temp 98 F (36.7 C) (Oral)   Resp 11   Ht 5\' 4"  (1.626 m)   Wt 105 lb (47.6 kg)   SpO2 96%   BMI 18.02 kg/m   HEMODYNAMICS:    VENTILATOR SETTINGS:    INTAKE / OUTPUT: I/O last 3 completed shifts: In: 529.1 [P.O.:480; I.V.:49.1] Out: 700  [Urine:700]  PHYSICAL EXAMINATION: General:  No acute distress Neuro: AAOX 3,  most all extremities, responds to questions appropriately HEENT: PERRLA, trachea midline, oral mucosa dry Cardiovascular: Rate and rhythm regular, S1, S2 audible, no murmur, regurg or gallop, no JVD, no edema Lungs: Clear to auscultation bilaterally Abdomen:  Positive bowel sounds in all 4 quadrants Musculoskeletal: No visible deformities, no joint swelling Skin: Warm and dry  LABS:  BMET  Recent Labs Lab 06/14/16 0932 06/14/16 1241 06/14/16 1811 06/15/16 0249  NA 134* 135  --  134*  K 3.0* 3.3* 3.5 3.8  CL 106 103  --  104  CO2 24 25  --  25  BUN 34* 31*  --  33*  CREATININE 0.88 0.91  --  0.84  GLUCOSE 154* 74  --  101*    Electrolytes  Recent Labs Lab 06/14/16 0259  06/14/16 0932 06/14/16 1241 06/15/16 0249  CALCIUM 9.3  < > 9.1 9.0 8.7*  MG 1.7  --   --   --  1.8  PHOS 4.7*  --   --   --   --   < > = values in this interval not displayed.  CBC  Recent Labs Lab 06/14/16 0009 06/14/16 0259 06/14/16 1241  WBC 13.6* 11.9* 16.1*  HGB 14.2 13.1 12.5  HCT 42.6 39.1 35.8  PLT 297 246 274    Coag's  Recent Labs Lab 06/14/16 0259 06/15/16 0249  INR 3.82  5.43*    Sepsis Markers No results for input(s): LATICACIDVEN, PROCALCITON, O2SATVEN in the last 168 hours.  ABG No results for input(s): PHART, PCO2ART, PO2ART in the last 168 hours.  Liver Enzymes No results for input(s): AST, ALT, ALKPHOS, BILITOT, ALBUMIN in the last 168 hours.  Cardiac Enzymes No results for input(s): TROPONINI, PROBNP in the last 168 hours.  Glucose  Recent Labs Lab 06/14/16 1704 06/14/16 1807 06/14/16 1935 06/14/16 2158 06/15/16 0202 06/15/16 0742  GLUCAP 62* 109* 111* 79 72 83    Imaging Ct Abdomen Pelvis Wo Contrast  Result Date: 06/14/2016 CLINICAL DATA:  diabetes as well as A. fib presents with high blood sugars associated with headache weakness and nausea. States that the  blood sugars of an out-of-control for the past several weeks but became the point today that she started feeling nausea which point she came to the ER. States that she has been compliant with her home insulin medications. Denies any fevers. States she has had increased urinary frequency. Does feel dehydrated. Denies any chest pain or shortness of breath. EXAM: CT ABDOMEN AND PELVIS WITHOUT CONTRAST TECHNIQUE: Multidetector CT imaging of the abdomen and pelvis was performed following the standard protocol without IV contrast. COMPARISON:  MR 04/17/2012, CT 09/09/2008 FINDINGS: Lower chest: No pleural or pericardial effusion.  No acute findings. Hepatobiliary: No focal liver abnormality is seen. No gallstones, gallbladder wall thickening, or biliary dilatation. Pancreas: Unremarkable. No pancreatic ductal dilatation or surrounding inflammatory changes. Spleen: Normal in size without focal abnormality. Stable course calcification median to the spleen . Adrenals/Urinary Tract: Adrenal glands are unremarkable. Kidneys are normal, without renal calculi, focal lesion, or hydronephrosis. Bladder is unremarkable. Stomach/Bowel: Stomach and small bowel are nondilated. Appendix not conclusively identified. Moderate colonic fecal material without dilatation. Gaseous distention of the rectum. Vascular/Lymphatic: Heavy aortic atheromatous calcifications without aneurysm. No adenopathy localized. Reproductive: Uterus and bilateral adnexa are unremarkable. Other: No ascites.  No free air. Musculoskeletal: Thoracolumbar levoscoliosis apex L1. Mild L1 compression deformity, age indeterminate. Advanced degenerative disc disease L2-3. Instrumented PLIF L3-L5. No worrisome bone lesions. IMPRESSION: 1. No acute abdominal process evident on this noncontrast exam. 2. Lumbar L1 compression deformity, age indeterminate. 3. Degenerative and postop changes in the lumbar spine as above Electronically Signed   By: Lucrezia Europe M.D.   On:  06/14/2016 11:54     STUDIES:  None  CULTURES: None  ANTIBIOTICS: None  SIGNIFICANT EVENTS: 06/14/2016: ED with severe hyperglycemia  LINES/TUBES: Peripheral IVs  DISCUSSION: 66 year old Caucasian female with a history of type 1 diabetes presenting with DKA likely secondary to medication nonadherence  ASSESSMENT Diabetic ketoacidosis. Atrial fibrillation-rate controlled History of depression and anxiety. History of hypertension History of hypothyroidism  PLAN Off insulin gtte and using own insulin pump. Monitor and replace electrolytes D/c IV fluids and encourage oral intake as tolerated INR is moderately supratherapeutic, hold today's dose and resume dosing per pharmacy. Hemodynamic monitoring per ICU protocol. Resume home antihypertensive and thyroid medications. Resume home pain medications. GI and DVT prophylaxis-patient is already on full strength anticoagulation and does not require GI prophylaxis as she is able to tolerate oral intake   FAMILY  - Updates: No family at bedside. We'll updated when available  - Inter-disciplinary family meet or Palliative Care meeting due by:  day 7  Plan of care discussed with  Dr. Jefferson Fuel  Patient is stable for transfer out of the icu  Colleen S. [facility] ANP-BC Pulmonary and Critical Care Medicine [facility] Pager [phone] or [phone]  06/15/2016, 7:59 AM

## 2016-06-15 NOTE — Progress Notes (Signed)
Pt to be discharged today. She is eager to get home to care for husband. Up and about in room using cane. Iv and tele removed. disch instructions given to her . No new prescriptions. Awaiting transport by daughter.

## 2016-06-15 NOTE — Discharge Instructions (Signed)
Heart healthy and ADA diet. Hold coumadin today, resume tomorrow. Follow up INR with PCP on 06/18/2016.

## 2016-06-17 LAB — BLOOD GAS, VENOUS
Acid-base deficit: 11.4 mmol/L — ABNORMAL HIGH (ref 0.0–2.0)
BICARBONATE: 15 mmol/L — AB (ref 20.0–28.0)
FIO2: 0.21
Patient temperature: 37
pCO2, Ven: 35 mmHg — ABNORMAL LOW (ref 44.0–60.0)
pH, Ven: 7.24 — ABNORMAL LOW (ref 7.250–7.430)

## 2016-06-19 DIAGNOSIS — N390 Urinary tract infection, site not specified: Secondary | ICD-10-CM | POA: Diagnosis not present

## 2016-06-19 DIAGNOSIS — R319 Hematuria, unspecified: Secondary | ICD-10-CM | POA: Diagnosis not present

## 2016-06-20 ENCOUNTER — Other Ambulatory Visit: Payer: Self-pay | Admitting: Internal Medicine

## 2016-06-24 DIAGNOSIS — R634 Abnormal weight loss: Secondary | ICD-10-CM | POA: Diagnosis not present

## 2016-06-24 DIAGNOSIS — E1022 Type 1 diabetes mellitus with diabetic chronic kidney disease: Secondary | ICD-10-CM | POA: Diagnosis not present

## 2016-06-24 DIAGNOSIS — Z1231 Encounter for screening mammogram for malignant neoplasm of breast: Secondary | ICD-10-CM | POA: Diagnosis not present

## 2016-06-24 DIAGNOSIS — F325 Major depressive disorder, single episode, in full remission: Secondary | ICD-10-CM | POA: Diagnosis not present

## 2016-06-24 DIAGNOSIS — I480 Paroxysmal atrial fibrillation: Secondary | ICD-10-CM | POA: Diagnosis not present

## 2016-06-24 DIAGNOSIS — E039 Hypothyroidism, unspecified: Secondary | ICD-10-CM | POA: Diagnosis not present

## 2016-06-24 DIAGNOSIS — N182 Chronic kidney disease, stage 2 (mild): Secondary | ICD-10-CM | POA: Diagnosis not present

## 2016-07-01 DIAGNOSIS — Z4681 Encounter for fitting and adjustment of insulin pump: Secondary | ICD-10-CM | POA: Diagnosis not present

## 2016-07-01 DIAGNOSIS — E1065 Type 1 diabetes mellitus with hyperglycemia: Secondary | ICD-10-CM | POA: Diagnosis not present

## 2016-07-03 DIAGNOSIS — Z7901 Long term (current) use of anticoagulants: Secondary | ICD-10-CM | POA: Diagnosis not present

## 2016-07-03 DIAGNOSIS — R0789 Other chest pain: Secondary | ICD-10-CM | POA: Diagnosis not present

## 2016-07-31 DIAGNOSIS — R791 Abnormal coagulation profile: Secondary | ICD-10-CM | POA: Diagnosis not present

## 2016-08-20 DIAGNOSIS — Z79891 Long term (current) use of opiate analgesic: Secondary | ICD-10-CM | POA: Diagnosis not present

## 2016-08-20 DIAGNOSIS — M5416 Radiculopathy, lumbar region: Secondary | ICD-10-CM | POA: Diagnosis not present

## 2016-08-20 DIAGNOSIS — M961 Postlaminectomy syndrome, not elsewhere classified: Secondary | ICD-10-CM | POA: Diagnosis not present

## 2016-08-20 DIAGNOSIS — G894 Chronic pain syndrome: Secondary | ICD-10-CM | POA: Diagnosis not present

## 2016-08-26 DIAGNOSIS — E1022 Type 1 diabetes mellitus with diabetic chronic kidney disease: Secondary | ICD-10-CM | POA: Diagnosis not present

## 2016-08-26 DIAGNOSIS — M48061 Spinal stenosis, lumbar region without neurogenic claudication: Secondary | ICD-10-CM | POA: Diagnosis not present

## 2016-08-26 DIAGNOSIS — I480 Paroxysmal atrial fibrillation: Secondary | ICD-10-CM | POA: Diagnosis not present

## 2016-08-26 DIAGNOSIS — E7800 Pure hypercholesterolemia, unspecified: Secondary | ICD-10-CM | POA: Diagnosis not present

## 2016-08-26 DIAGNOSIS — Z0000 Encounter for general adult medical examination without abnormal findings: Secondary | ICD-10-CM | POA: Diagnosis not present

## 2016-08-26 DIAGNOSIS — E039 Hypothyroidism, unspecified: Secondary | ICD-10-CM | POA: Diagnosis not present

## 2016-08-26 DIAGNOSIS — I10 Essential (primary) hypertension: Secondary | ICD-10-CM | POA: Diagnosis not present

## 2016-08-26 DIAGNOSIS — N182 Chronic kidney disease, stage 2 (mild): Secondary | ICD-10-CM | POA: Diagnosis not present

## 2016-08-26 DIAGNOSIS — F325 Major depressive disorder, single episode, in full remission: Secondary | ICD-10-CM | POA: Diagnosis not present

## 2016-09-04 DIAGNOSIS — Z981 Arthrodesis status: Secondary | ICD-10-CM | POA: Diagnosis not present

## 2016-09-04 DIAGNOSIS — M419 Scoliosis, unspecified: Secondary | ICD-10-CM | POA: Diagnosis not present

## 2016-09-09 ENCOUNTER — Other Ambulatory Visit: Payer: Self-pay | Admitting: Neurosurgery

## 2016-09-09 ENCOUNTER — Ambulatory Visit
Admission: RE | Admit: 2016-09-09 | Discharge: 2016-09-09 | Disposition: A | Discharge status: Home / Self Care | Payer: Medicare Other | Source: Ambulatory Visit | Attending: Neurosurgery | Admitting: Neurosurgery

## 2016-09-09 DIAGNOSIS — Z981 Arthrodesis status: Secondary | ICD-10-CM | POA: Insufficient documentation

## 2016-09-09 DIAGNOSIS — M4856XA Collapsed vertebra, not elsewhere classified, lumbar region, initial encounter for fracture: Secondary | ICD-10-CM | POA: Diagnosis not present

## 2016-09-09 DIAGNOSIS — M4185 Other forms of scoliosis, thoracolumbar region: Secondary | ICD-10-CM | POA: Diagnosis not present

## 2016-09-09 DIAGNOSIS — M435X6 Other recurrent vertebral dislocation, lumbar region: Secondary | ICD-10-CM | POA: Diagnosis not present

## 2016-09-11 ENCOUNTER — Other Ambulatory Visit: Payer: Self-pay | Admitting: Neurosurgery

## 2016-09-11 DIAGNOSIS — Z981 Arthrodesis status: Secondary | ICD-10-CM

## 2016-09-11 DIAGNOSIS — M419 Scoliosis, unspecified: Secondary | ICD-10-CM

## 2016-09-11 DIAGNOSIS — I480 Paroxysmal atrial fibrillation: Secondary | ICD-10-CM | POA: Diagnosis not present

## 2016-09-27 DIAGNOSIS — R791 Abnormal coagulation profile: Secondary | ICD-10-CM | POA: Diagnosis not present

## 2016-10-04 ENCOUNTER — Ambulatory Visit: Payer: Medicare Other

## 2016-10-04 ENCOUNTER — Ambulatory Visit: Admission: RE | Admit: 2016-10-04 | Payer: Medicare Other | Source: Ambulatory Visit

## 2016-10-07 DIAGNOSIS — E1142 Type 2 diabetes mellitus with diabetic polyneuropathy: Secondary | ICD-10-CM | POA: Insufficient documentation

## 2016-10-07 DIAGNOSIS — M5417 Radiculopathy, lumbosacral region: Secondary | ICD-10-CM | POA: Insufficient documentation

## 2016-10-08 DIAGNOSIS — R791 Abnormal coagulation profile: Secondary | ICD-10-CM | POA: Diagnosis not present

## 2016-10-09 ENCOUNTER — Ambulatory Visit
Admission: RE | Admit: 2016-10-09 | Discharge: 2016-10-09 | Disposition: A | Discharge status: Home / Self Care | Payer: Medicare Other | Source: Ambulatory Visit | Attending: Neurosurgery | Admitting: Neurosurgery

## 2016-10-09 DIAGNOSIS — Z981 Arthrodesis status: Secondary | ICD-10-CM

## 2016-10-09 DIAGNOSIS — M419 Scoliosis, unspecified: Secondary | ICD-10-CM

## 2016-10-09 DIAGNOSIS — G4731 Primary central sleep apnea: Secondary | ICD-10-CM | POA: Insufficient documentation

## 2016-10-09 DIAGNOSIS — M5416 Radiculopathy, lumbar region: Secondary | ICD-10-CM | POA: Insufficient documentation

## 2016-10-09 DIAGNOSIS — M961 Postlaminectomy syndrome, not elsewhere classified: Secondary | ICD-10-CM | POA: Insufficient documentation

## 2016-10-09 DIAGNOSIS — I10 Essential (primary) hypertension: Secondary | ICD-10-CM | POA: Insufficient documentation

## 2016-10-09 NOTE — Discharge Instructions (Addendum)
Myelogram Discharge Instructions  1. Go home and rest quietly for the next 24 hours.  It is important to lie flat for the next 24 hours.  Get up only to go to the restroom.  You may lie in the bed or on a couch on your back, your stomach, your left side or your right side.  You may have one pillow under your head.  You may have pillows between your knees while you are on your side or under your knees while you are on your back.  2. DO NOT drive today.  Recline the seat as far back as it will go, while still wearing your seat belt, on the way home.  3. You may get up to go to the bathroom as needed.  You may sit up for 10 minutes to eat.  You may resume your normal diet and medications unless otherwise indicated.  Drink lots of extra fluids today and tomorrow.  4. The incidence of headache, nausea, or vomiting is about 5% (one in 20 patients).  If you develop a headache, lie flat and drink plenty of fluids until the headache goes away.  Caffeinated beverages may be helpful.  If you develop severe nausea and vomiting or a headache that does not go away with flat bed rest, call [phone].  5. You may resume normal activities after your 24 hours of bed rest is over; however, do not exert yourself strongly or do any heavy lifting tomorrow. If when you get up you have a headache when standing, go back to bed and force fluids for another 24 hours.  6. Call your physician for a follow-up appointment.  The results of your myelogram will be sent directly to your physician by the following day.  7. If you have any questions or if complications develop after you arrive home, please call [phone].  Discharge instructions have been explained to the patient.  The patient, or the person responsible for the patient, fully understands these instructions.       MAY RESUME COUMADIN TODAY.   May resume Celexa and Elavil on Sept. 20, 2018, after 11:30 am.

## 2016-10-11 DIAGNOSIS — E1159 Type 2 diabetes mellitus with other circulatory complications: Secondary | ICD-10-CM | POA: Diagnosis not present

## 2016-10-11 DIAGNOSIS — E039 Hypothyroidism, unspecified: Secondary | ICD-10-CM | POA: Diagnosis not present

## 2016-10-11 DIAGNOSIS — I10 Essential (primary) hypertension: Secondary | ICD-10-CM | POA: Diagnosis not present

## 2016-10-11 DIAGNOSIS — E1065 Type 1 diabetes mellitus with hyperglycemia: Secondary | ICD-10-CM | POA: Diagnosis not present

## 2016-10-15 DIAGNOSIS — I480 Paroxysmal atrial fibrillation: Secondary | ICD-10-CM | POA: Diagnosis not present

## 2016-10-24 DIAGNOSIS — I480 Paroxysmal atrial fibrillation: Secondary | ICD-10-CM | POA: Diagnosis not present

## 2016-10-28 DIAGNOSIS — M5416 Radiculopathy, lumbar region: Secondary | ICD-10-CM | POA: Diagnosis not present

## 2016-10-28 DIAGNOSIS — M961 Postlaminectomy syndrome, not elsewhere classified: Secondary | ICD-10-CM | POA: Diagnosis not present

## 2016-10-28 DIAGNOSIS — Z79891 Long term (current) use of opiate analgesic: Secondary | ICD-10-CM | POA: Diagnosis not present

## 2016-10-28 DIAGNOSIS — G894 Chronic pain syndrome: Secondary | ICD-10-CM | POA: Diagnosis not present

## 2016-11-04 DIAGNOSIS — I480 Paroxysmal atrial fibrillation: Secondary | ICD-10-CM | POA: Diagnosis not present

## 2016-11-11 DIAGNOSIS — R791 Abnormal coagulation profile: Secondary | ICD-10-CM | POA: Diagnosis not present

## 2016-11-27 DIAGNOSIS — E039 Hypothyroidism, unspecified: Secondary | ICD-10-CM | POA: Diagnosis not present

## 2016-11-27 DIAGNOSIS — I10 Essential (primary) hypertension: Secondary | ICD-10-CM | POA: Diagnosis not present

## 2016-11-27 DIAGNOSIS — E1159 Type 2 diabetes mellitus with other circulatory complications: Secondary | ICD-10-CM | POA: Diagnosis not present

## 2016-11-27 DIAGNOSIS — E1065 Type 1 diabetes mellitus with hyperglycemia: Secondary | ICD-10-CM | POA: Diagnosis not present

## 2016-12-02 DIAGNOSIS — Z7901 Long term (current) use of anticoagulants: Secondary | ICD-10-CM | POA: Diagnosis not present

## 2016-12-02 DIAGNOSIS — Z79899 Other long term (current) drug therapy: Secondary | ICD-10-CM | POA: Diagnosis not present

## 2016-12-02 DIAGNOSIS — M961 Postlaminectomy syndrome, not elsewhere classified: Secondary | ICD-10-CM | POA: Diagnosis not present

## 2016-12-02 DIAGNOSIS — M545 Low back pain: Secondary | ICD-10-CM | POA: Diagnosis not present

## 2016-12-02 DIAGNOSIS — G894 Chronic pain syndrome: Secondary | ICD-10-CM | POA: Diagnosis not present

## 2016-12-02 DIAGNOSIS — I4891 Unspecified atrial fibrillation: Secondary | ICD-10-CM | POA: Diagnosis not present

## 2016-12-02 DIAGNOSIS — E1040 Type 1 diabetes mellitus with diabetic neuropathy, unspecified: Secondary | ICD-10-CM | POA: Diagnosis not present

## 2016-12-02 DIAGNOSIS — Z9641 Presence of insulin pump (external) (internal): Secondary | ICD-10-CM | POA: Diagnosis not present

## 2016-12-02 DIAGNOSIS — Z91011 Allergy to milk products: Secondary | ICD-10-CM | POA: Diagnosis not present

## 2016-12-02 DIAGNOSIS — Z885 Allergy status to narcotic agent status: Secondary | ICD-10-CM | POA: Diagnosis not present

## 2016-12-02 DIAGNOSIS — M5416 Radiculopathy, lumbar region: Secondary | ICD-10-CM | POA: Diagnosis not present

## 2016-12-02 DIAGNOSIS — Z888 Allergy status to other drugs, medicaments and biological substances status: Secondary | ICD-10-CM | POA: Diagnosis not present

## 2016-12-02 DIAGNOSIS — Z79891 Long term (current) use of opiate analgesic: Secondary | ICD-10-CM | POA: Diagnosis not present

## 2016-12-16 DIAGNOSIS — I482 Chronic atrial fibrillation: Secondary | ICD-10-CM | POA: Diagnosis not present

## 2016-12-23 DIAGNOSIS — E7800 Pure hypercholesterolemia, unspecified: Secondary | ICD-10-CM | POA: Diagnosis not present

## 2016-12-23 DIAGNOSIS — F325 Major depressive disorder, single episode, in full remission: Secondary | ICD-10-CM | POA: Diagnosis not present

## 2016-12-23 DIAGNOSIS — E039 Hypothyroidism, unspecified: Secondary | ICD-10-CM | POA: Diagnosis not present

## 2016-12-23 DIAGNOSIS — E1159 Type 2 diabetes mellitus with other circulatory complications: Secondary | ICD-10-CM | POA: Diagnosis not present

## 2016-12-23 DIAGNOSIS — I10 Essential (primary) hypertension: Secondary | ICD-10-CM | POA: Diagnosis not present

## 2016-12-23 DIAGNOSIS — I480 Paroxysmal atrial fibrillation: Secondary | ICD-10-CM | POA: Diagnosis not present

## 2016-12-23 DIAGNOSIS — E1065 Type 1 diabetes mellitus with hyperglycemia: Secondary | ICD-10-CM | POA: Diagnosis not present

## 2017-07-20 IMAGING — CT CT ABD-PELV W/O CM
2 of 4 series · 16 of 46 positions shown, 18 images · non-contrast
Comparison: MR 04/17/2012, CT 09/09/2008

CLINICAL DATA: diabetes as well as A. fib presents with high blood
sugars associated with headache weakness and nausea. States that the
blood sugars of an out-of-control for the past several weeks but
became the point today that she started feeling nausea which point
she came to the ER. States that she has been compliant with her home
insulin medications. Denies any fevers. States she has had increased
urinary frequency. Does feel dehydrated. Denies any chest pain or
shortness of breath.

EXAM:
CT ABDOMEN AND PELVIS WITHOUT CONTRAST
TECHNIQUE: Multidetector CT imaging of the abdomen and pelvis was performed
following the standard protocol without IV contrast.

[Series 2: axial st · axial · 0.71mm/px · z∈[-1178,-793]mm · 13 of 85 slices shown, 15 images]
[im 4/85  soft-tissue]
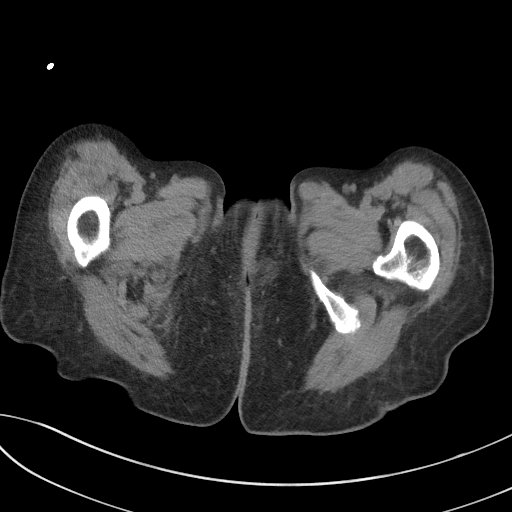
[im 4/85  bone]
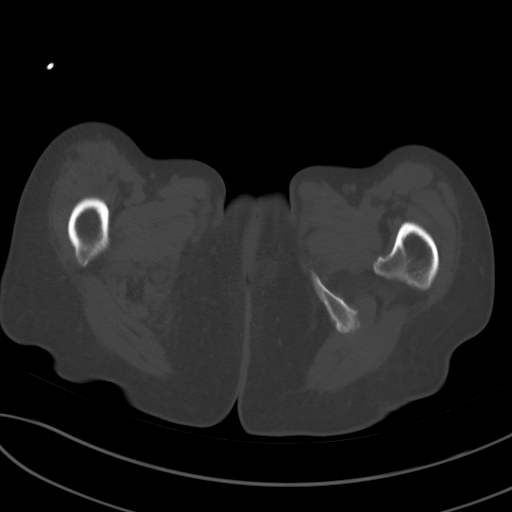
[im 11/85  soft-tissue]
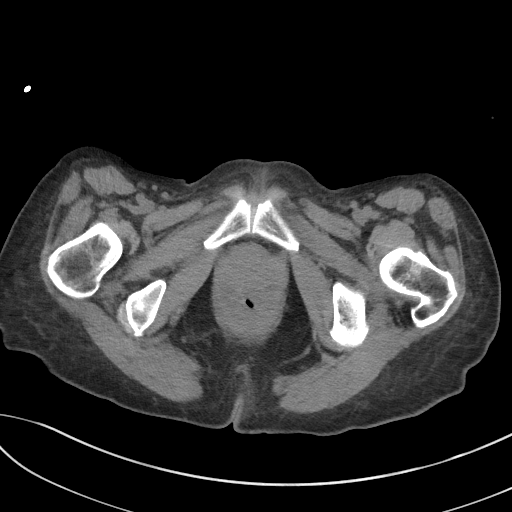
[im 19/85  soft-tissue]
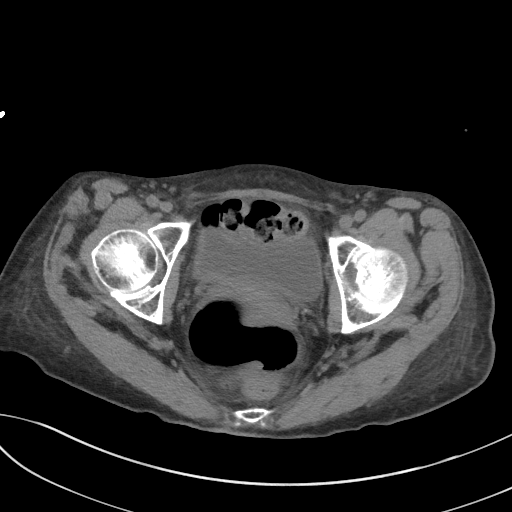
[im 22/85  soft-tissue]
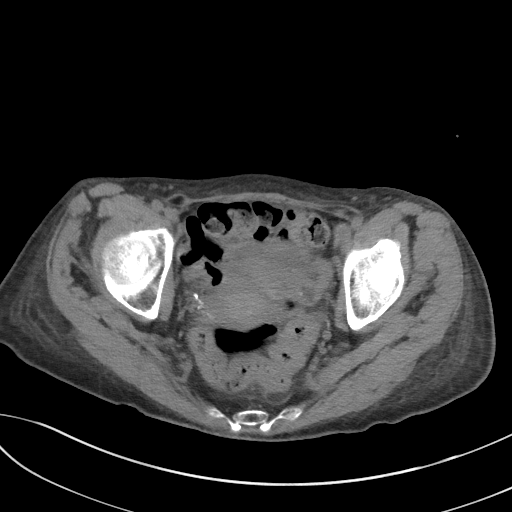
[im 30/85  soft-tissue]
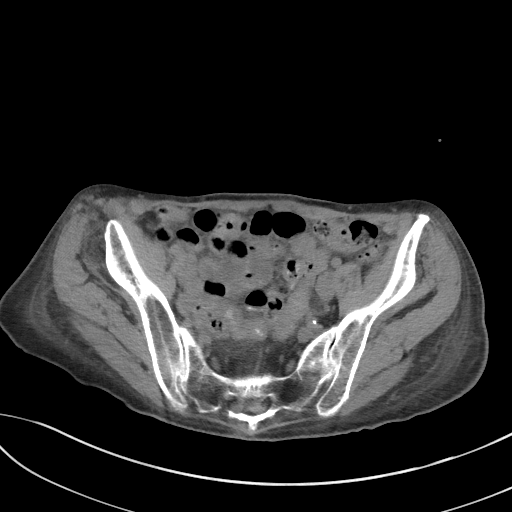
[im 37/85  soft-tissue]
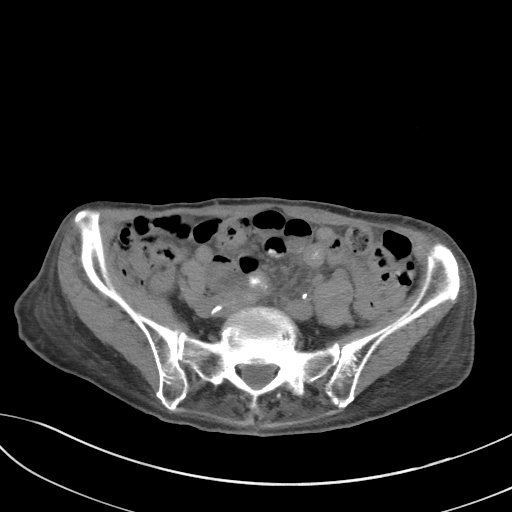
[im 44/85  soft-tissue]
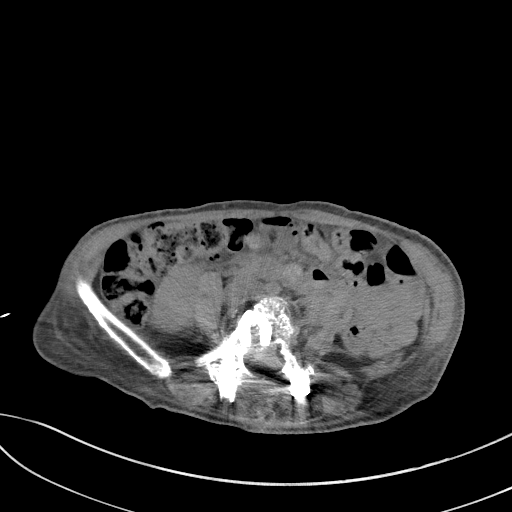
[im 48/85  soft-tissue]
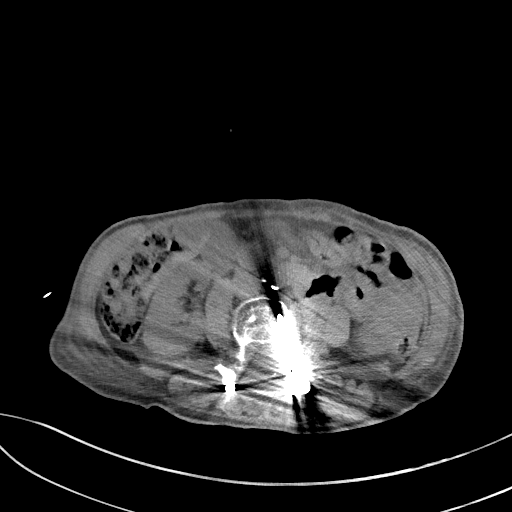
[im 55/85  soft-tissue]
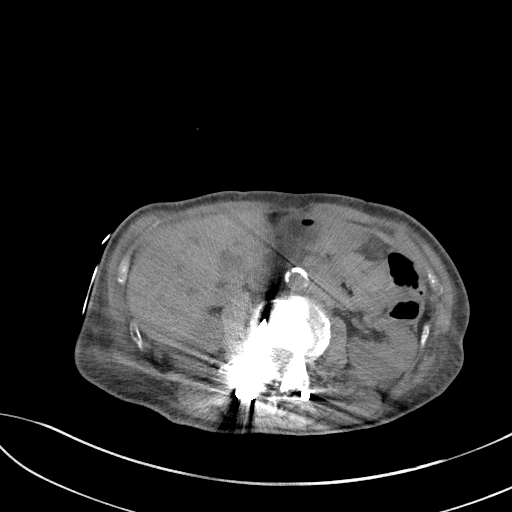
[im 55/85  bone]
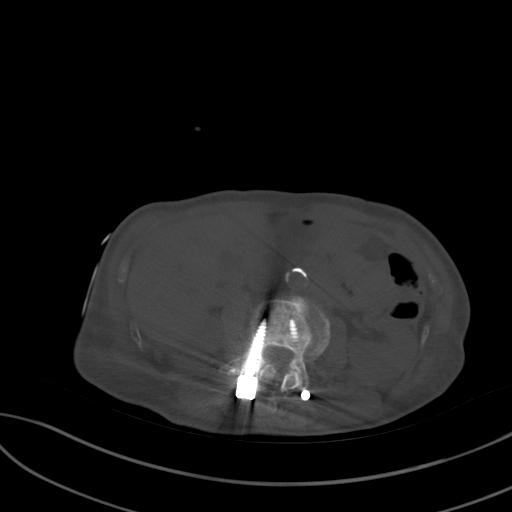
[im 63/85  soft-tissue]
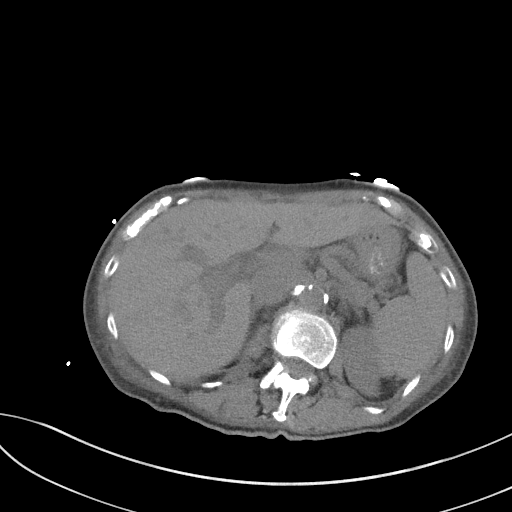
[im 66/85  soft-tissue]
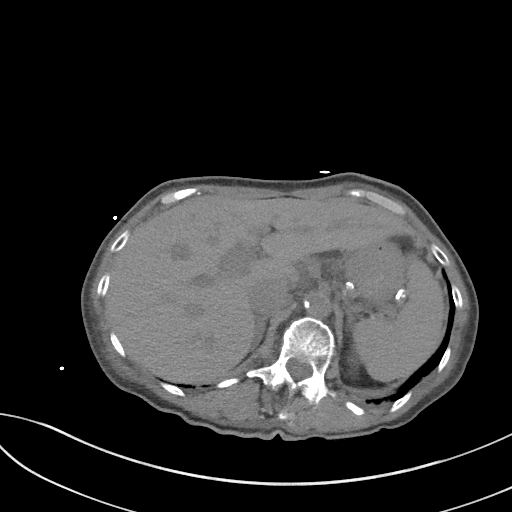
[im 74/85  soft-tissue]
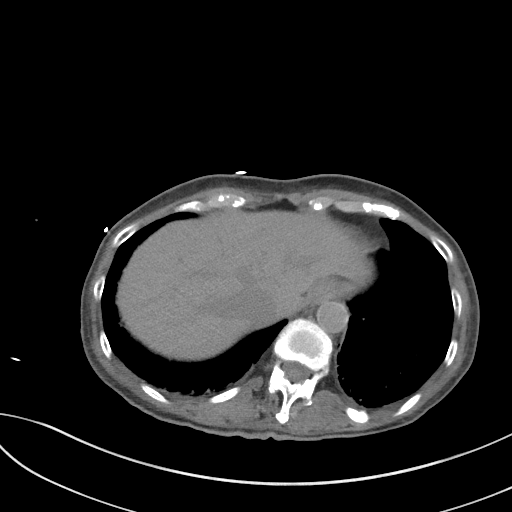
[im 81/85  soft-tissue]
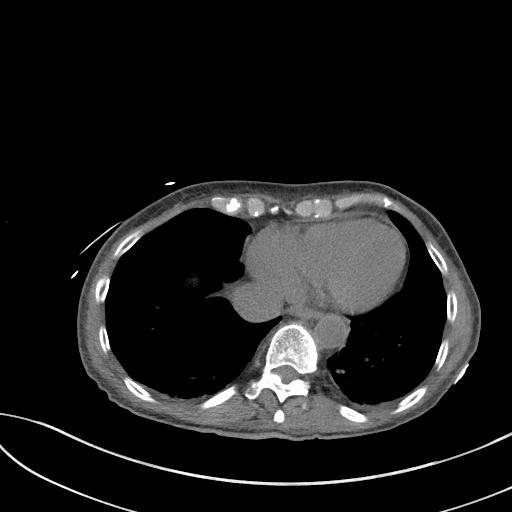

[Series 5: coronal st · coronal · 0.79mm/px · 3 of 84 slices shown]
[im 28/84  soft-tissue]
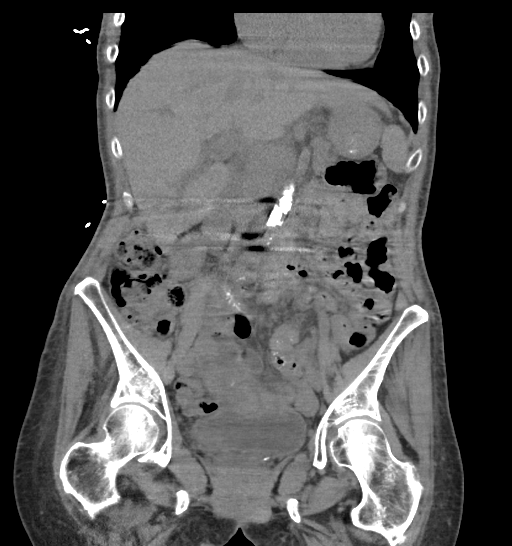
[im 37/84  soft-tissue]
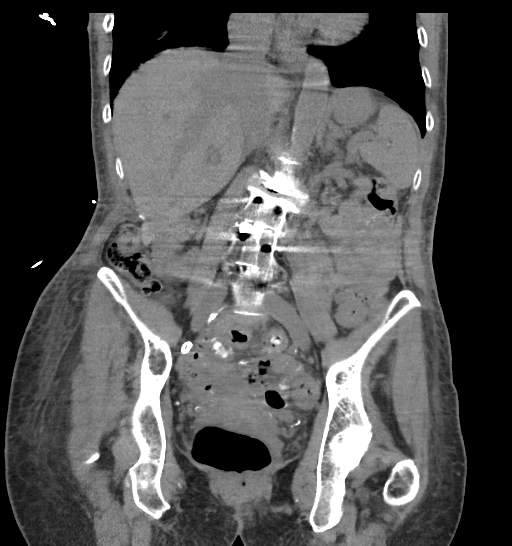
[im 47/84  soft-tissue]
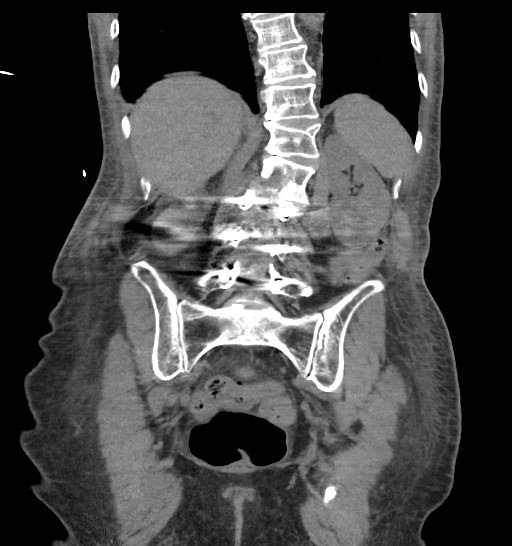

[16 of 46 positions shown; findings below may reference images not displayed]

FINDINGS: Lower chest: No pleural or pericardial effusion.  No acute findings.

Hepatobiliary: No focal liver abnormality is seen. No gallstones,
gallbladder wall thickening, or biliary dilatation.

Pancreas: Unremarkable. No pancreatic ductal dilatation or
surrounding inflammatory changes.

Spleen: Normal in size without focal abnormality. Stable course
calcification median to the spleen .

Adrenals/Urinary Tract: Adrenal glands are unremarkable. Kidneys are
normal, without renal calculi, focal lesion, or hydronephrosis.
Bladder is unremarkable.

Stomach/Bowel: Stomach and small bowel are nondilated. Appendix not
conclusively identified. Moderate colonic fecal material without
dilatation. Gaseous distention of the rectum.

Vascular/Lymphatic: Heavy aortic atheromatous calcifications without
aneurysm. No adenopathy localized.

Reproductive: Uterus and bilateral adnexa are unremarkable.

Other: No ascites.  No free air.

Musculoskeletal: Thoracolumbar levoscoliosis apex L1. Mild L1
compression deformity, age indeterminate. Advanced degenerative disc
disease L2-3. Instrumented PLIF L3-L5. No worrisome bone lesions.
IMPRESSION: 1. No acute abdominal process evident on this noncontrast exam.
2. Lumbar L1 compression deformity, age indeterminate.
3. Degenerative and postop changes in the lumbar spine as above

## 2017-07-20 IMAGING — DX DG CHEST 1V PORT
1 series · 1 of 1 positions shown · non-contrast
Comparison: 07/31/2010

CLINICAL DATA: Weakness and hyperglycemia.

EXAM:
PORTABLE CHEST 1 VIEW

[chest ap]
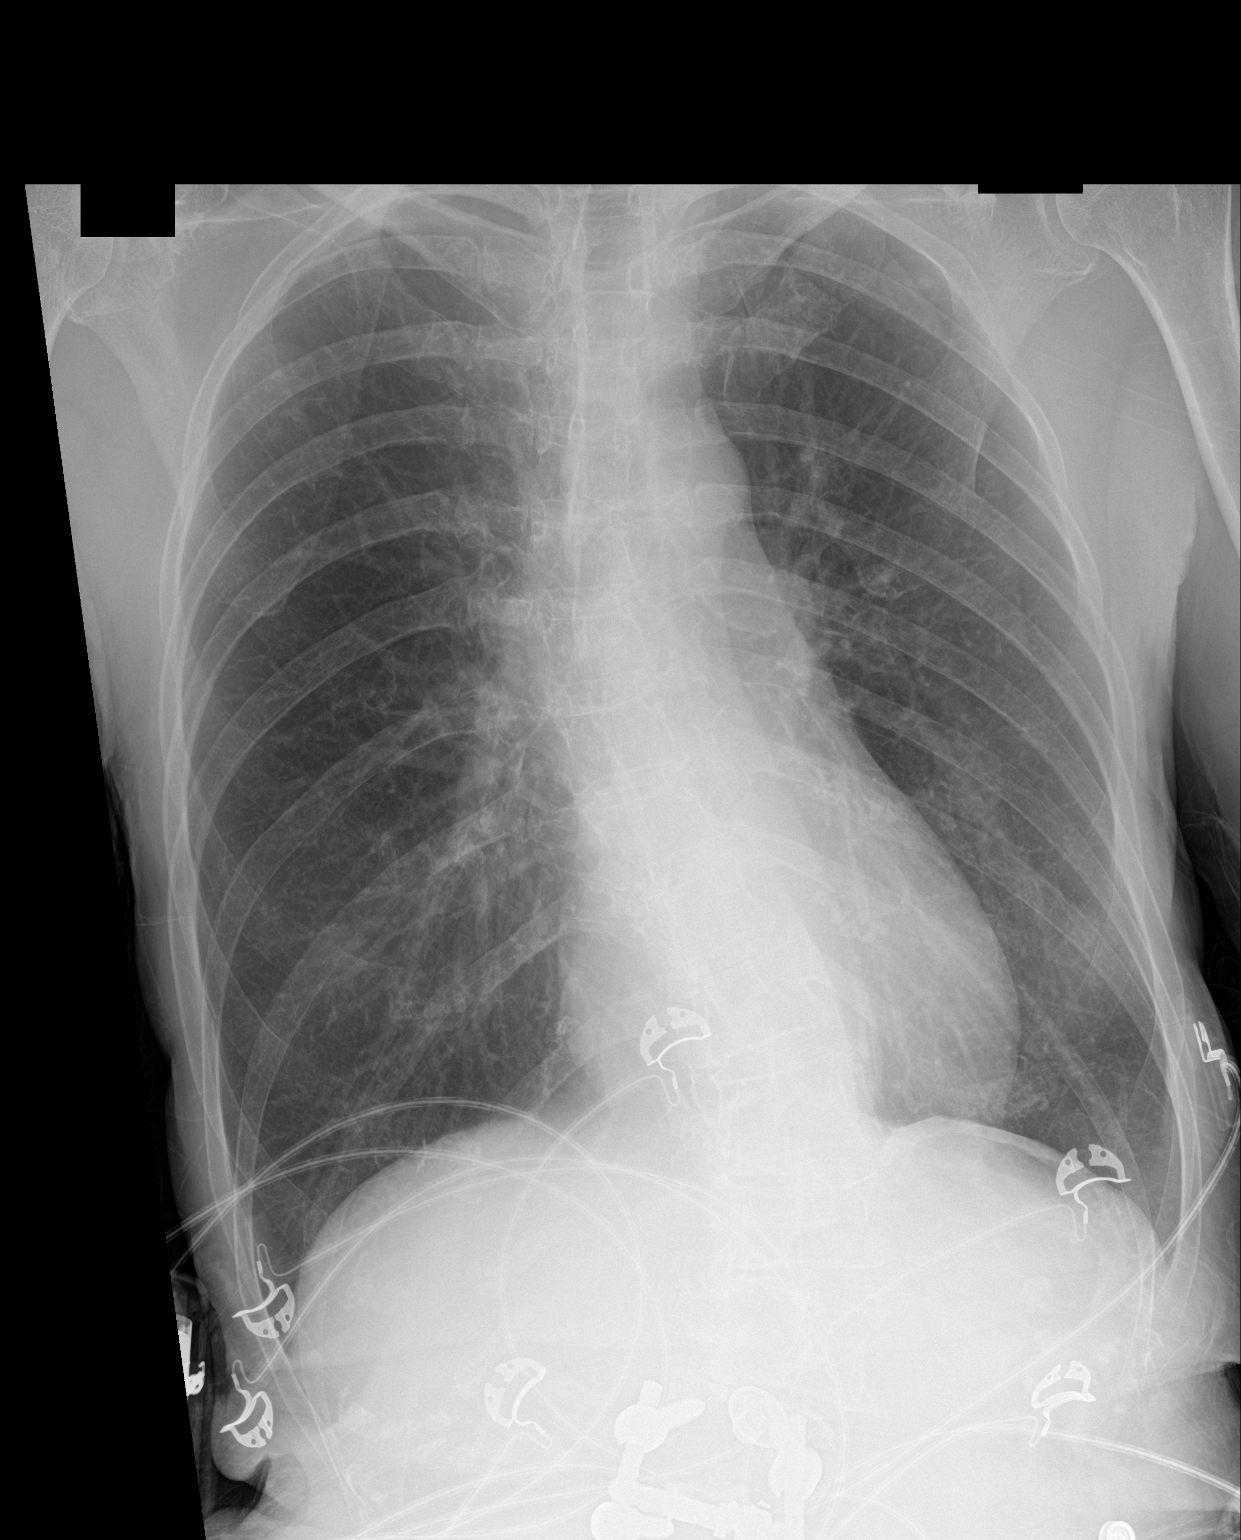

[1 of 1 positions shown; findings below may reference images not displayed]

FINDINGS: Normal heart size and pulmonary vascularity. Diffuse emphysematous
changes and scattered fibrosis in the lungs. No focal consolidation
or airspace disease. No blunting of costophrenic angles. No
pneumothorax. Thoracolumbar scoliosis with the proximal convexity
towards the right and distal convexity towards the left.
Postoperative changes in the lumbar spine.
IMPRESSION: Emphysematous changes in the lungs. No evidence of active pulmonary
disease.

## 2017-11-03 ENCOUNTER — Other Ambulatory Visit: Payer: Self-pay | Admitting: Anesthesiology

## 2017-11-03 ENCOUNTER — Ambulatory Visit
Admission: RE | Admit: 2017-11-03 | Discharge: 2017-11-03 | Disposition: A | Discharge status: Home / Self Care | Payer: Medicare Other | Source: Ambulatory Visit | Attending: Anesthesiology | Admitting: Anesthesiology

## 2017-11-05 ENCOUNTER — Other Ambulatory Visit: Payer: Self-pay | Admitting: Anesthesiology

## 2017-11-05 ENCOUNTER — Ambulatory Visit
Admission: RE | Admit: 2017-11-05 | Discharge: 2017-11-05 | Disposition: A | Discharge status: Home / Self Care | Payer: Medicare Other | Source: Ambulatory Visit | Attending: Anesthesiology | Admitting: Anesthesiology

## 2017-11-05 DIAGNOSIS — M961 Postlaminectomy syndrome, not elsewhere classified: Secondary | ICD-10-CM

## 2017-11-05 DIAGNOSIS — M419 Scoliosis, unspecified: Secondary | ICD-10-CM

## 2017-11-05 DIAGNOSIS — M5416 Radiculopathy, lumbar region: Secondary | ICD-10-CM | POA: Insufficient documentation

## 2017-11-05 DIAGNOSIS — G894 Chronic pain syndrome: Secondary | ICD-10-CM | POA: Insufficient documentation

## 2017-11-19 ENCOUNTER — Encounter: Payer: Self-pay | Admitting: Physical Therapy

## 2017-11-19 ENCOUNTER — Other Ambulatory Visit: Payer: Self-pay

## 2017-11-19 ENCOUNTER — Ambulatory Visit: Payer: Medicare Other | Attending: Anesthesiology

## 2017-11-19 DIAGNOSIS — M4126 Other idiopathic scoliosis, lumbar region: Secondary | ICD-10-CM | POA: Diagnosis not present

## 2017-11-19 DIAGNOSIS — M5416 Radiculopathy, lumbar region: Secondary | ICD-10-CM | POA: Insufficient documentation

## 2017-11-19 NOTE — Therapy (Signed)
[facility] [address], [place], [address] Phone: [phone]   Fax:  [phone]  Physical Therapy Evaluation  Patient Details  Name: Colleen Payne MRN: [id number] Date of Birth: Aug 31, 1950 Referring Provider (PT): Jonelle Sidle Lobonc    Encounter Date: 11/19/2017  PT End of Session - 11/19/17 1131    Visit Number  1    Number of Visits  16    Date for PT Re-Evaluation  01/14/18    Authorization Type  Medicare    Authorization Time Period  1/10    PT Start Time  1018    PT Stop Time  1115    PT Time Calculation (min)  57 min    Activity Tolerance  Patient tolerated treatment well    Behavior During Therapy  [facility] for tasks assessed/performed       Past Medical History:  Diagnosis Date  . Anxiety   . Atrial fibrillation ([place])   . Depression   . Diabetes mellitus without complication (HCC)    Type I   . Headache    stress. 1x/month  . Hypertension   . Insulin pump in place   . Motion sickness    cars  . MVP (mitral valve prolapse)   . Neuromuscular disorder (HCC)    leg weakness s/p back surgeries  . Thyroid disease     Past Surgical History:  Procedure Laterality Date  . BACK SURGERY  2004   3 sugeries between Sept and Nov, fusion L2-S1  . CATARACT EXTRACTION W/PHACO Left 11/23/2014   Procedure: CATARACT EXTRACTION PHACO AND INTRAOCULAR LENS PLACEMENT (IOC);  Surgeon: Leandrew Koyanagi, MD;  Location: [facility];  Service: Ophthalmology;  Laterality: Left;  DIABETIC - insulin pump    There were no vitals filed for this visit.    Subjective Assessment - 11/19/17 1230    Subjective  Patient reports that her main complaint is being unable to stand up straight, and R leg numbness.    Pertinent History  Patient is 67 yo female that complains of lumbar pain and inability to walk erect, as well as hip/abdominal pain. Patient with history of lumbar fusion (L2-S1). Xray results  show: Fairly stable appearance of the levoscoliosis of the lower thoracic and of the lumbar spine.Stable lateral subluxation toward the left of L2 with respect L3. Stable partial compression of the body of L1. Patient complains of frustration with physicians, and that her symptoms are worsening over time. 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 Reported that her pain and difficulty worsened after trying to pick her husband up about 1 year ago. Patient mentioned that she has been told that she has "spinal fluid pockets that are leaking". PMH also includes DM I with insulin pump,  afib, HTN,  HLD, chronic pain syndrome, SOB.    Limitations  Standing;Walking;House hold activities;Other (comment)   caregiver activities for husband   Diagnostic tests  see exray results: Fairly stable appearance of the levoscoliosis of the lower thoracic and lumbar spine. Stable lateral subluxation toward left of L2 with respect of L3, stable partial compression of the body of L1.    Patient Stated Goals  pain relief, standing erect    Currently in Pain?  Yes    Pain Score  10-Worst pain ever   best:5, worst: 10   Pain Location  Back    Pain Orientation  Left;Lower    Pain Descriptors /  Indicators  Aching;Burning;Tightness    Pain Type  Chronic pain    Pain Radiating Towards  R Foot    Pain Onset  More than a month ago    Pain Frequency  Constant    Aggravating Factors   standing erect, lumbar movements, ADLs, household activities, taking care of husband    Pain Relieving Factors  sitting, rest    Effect of Pain on Daily Activities  significantly impedes daily activities    Multiple Pain Sites  Yes    Pain Location  Hip    Pain Orientation  Right;Left;Lateral;Medial;Posterior;Anterior    Pain Descriptors / Indicators  Tightness;Discomfort    Pain Type  Chronic pain    Pain Onset  More than a month ago    Pain Frequency  Constant    Aggravating Factors   standing erect,  household and caregiver activities, ADLs    Pain Relieving Factors  rest    Effect of Pain on Daily Activities  significantly impedes daily activities       Objective measurements completed on examination: See above findings.    Treatment:   Therapeutic exercise: Patient performed with instruction, verbal cues, tactile cues of therapist: goal: improve tissue extensibility, increase relaxation  diaphragmatic breathing 5 sets of breathing with demo and verbal cues from PT Supine lat stretch (SB to L, RUE up over head) 3x40s holds Laying prone x23minutes including during STM, patient reporting conflicting information of radicular symptoms (increase in R foot numbness, then decrease with R knee bent and propped, then increase in numbness).   Manual Therapy: 5 min: goal : decrease muscle tension, spasms, address pain STM to R posterior hip in prone position with pillow under hips    Patient response to treatment: patient demonstrated improved technique with exercises with repeated demonstration and VC. Improved pain and slight decrease in forward flexion in standing. Patient reported pain improved from 10/10 to 7/10 at end of session.      PT Education - 11/19/17 1249    Education Details  therex, POC, condition, role of PT    Person(s) Educated  Patient    Methods  Explanation;Demonstration;Tactile cues    Comprehension  Verbalized understanding;Verbal cues required;Tactile cues required       PT Short Term Goals - 11/19/17 1253      PT SHORT TERM GOAL #1   Title  Patient will be adherent to initial HEP at least 3x a week to improve functional strength and balance for better safety at home.    Time  4    Period  Weeks    Status  New    Target Date  12/17/17        PT Long Term Goals - 11/19/17 1255      PT LONG TERM GOAL #1   Title   Patient will be independent in bending down towards floor and picking up small object (<5 pounds) and then stand back up with pain 5/10 or  less to improve ability to pick up and clean up room at home    Baseline  pain ranges from 5-10/10    Time  8    Period  Weeks    Status  New    Target Date  01/14/18      PT LONG TERM GOAL #2   Title  Patient will increase BLE gross strength to 4+/5 as to improve functional strength for independent gait, increased standing tolerance and increased ADL ability.    Time  8  Period  Weeks    Status  New    Target Date  01/14/18      PT LONG TERM GOAL #3   Title  Patient will report a worst pain of 5/10 80% of the time to improve tolerance with ADLs and reduced symptoms with activities.      Time  8    Period  Weeks    Status  New    Target Date  01/14/18      PT LONG TERM GOAL #4   Title  Patient will demonstrate an improved tolerance and ability to achieve erect standing posture to achieve patient stated goals as well as improve ability to perform functional activities.     Time  8    Period  Weeks    Status  New    Target Date  01/14/18             Plan - 11/19/17 1249    Clinical Impression Statement  Patient is 67 yo female that presented to clinic with lumbar, hip/abdominal pain s/p lumbar fusion (L2-S1), with main complaint of radicular symptoms into RLE and inability stand/ambulate erect. Upon assessment patient demonstrated forward flexion and rotation during ambulation, hip, lumbar, and core strength and ROM deficits. Patient also demonstrate spinal curvature and rib hump that is consistent with xray results. The patient would benefit from further skilled PT to optimize mobility, safety, pain management, and to improve balance and gait.    History and Personal Factors relevant to plan of care:  scoliosis, DM I, chronic pain syndrome, depression, afib, HTN    Clinical Presentation  Evolving    Clinical Presentation due to:  Patient reports her symptoms are worsening over time    Clinical Decision Making  Moderate    Rehab Potential  Fair    Clinical Impairments  Affecting Rehab Potential  comorbidities, symptom duration, curvature degree    PT Frequency  2x / week    PT Duration  8 weeks    PT Treatment/Interventions  ADLs/Self Care Home Management;Aquatic Therapy;Cryotherapy;Ultrasound;Parrafin;Traction;Moist Heat;Electrical Stimulation;DME Instruction;Gait training;Stair training;Functional mobility training;Neuromuscular re-education;Balance training;Therapeutic exercise;Therapeutic activities;Patient/family education;Energy conservation;Spinal Manipulations;Joint Manipulations;Passive range of motion;Dry needling;Manual techniques    PT Next Visit Plan  continue to add to HEP, pain management    PT Home Exercise Plan  see pt instruction section: diaphragmatic breathing in supine, supine R SB and lat stretch    Consulted and Agree with Plan of Care  Patient       Patient will benefit from skilled therapeutic intervention in order to improve the following deficits and impairments:  Abnormal gait, Decreased balance, Decreased endurance, Decreased mobility, Difficulty walking, Hypomobility, Increased muscle spasms, Decreased range of motion, Pain, Postural dysfunction, Impaired flexibility, Increased fascial restricitons, Decreased strength, Decreased activity tolerance  Visit Diagnosis: Other idiopathic scoliosis, lumbar region - Plan: PT plan of care cert/re-cert  Radiculopathy, lumbar region - Plan: PT plan of care cert/re-cert     Problem List Patient Active Problem List   Diagnosis Date Noted  . Central sleep apnea 10/09/2016  . Hypertension 10/09/2016  . Lumbar postlaminectomy syndrome 10/09/2016  . Lumbar radiculopathy 10/09/2016  . Diabetic polyneuropathy ([place]) 10/07/2016  . Right lumbosacral radiculopathy 10/07/2016  . Healthcare maintenance 08/26/2016  . DKA (diabetic ketoacidoses) ([place]) 06/14/2016  . Atypical chest pain 02/23/2016  . SOB (shortness of breath) 02/23/2016  . Pain medication agreement signed 01/13/2015  . Acquired  hypothyroidism 05/30/2014  . Frontal sinusitis 12/17/2013  . Atrial fibrillation ([place]) 11/29/2013  .  Major depression in remission ([place]) 11/20/2013  . 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 Chronic pain syndrome 08/27/2013  . Back pain 07/17/2013  . HTN (hypertension), benign 07/17/2013  . Hyperlipidemia 07/17/2013  . Pancreatic insufficiency 07/17/2013  . Chronic postoperative pain 01/01/2013  . Long term current use of opiate analgesic 11/04/2012  . Hypothyroidism 08/25/2012  . Radiculopathy of leg 05/01/2011    Lieutenant Diego PT, DPT 1:07 PM,11/19/17 [facility] [address] [place], [address] Phone: [phone]   Fax:  [phone]  Name: Colleen Payne MRN: [id number] Date of Birth: March 23, 1950

## 2017-11-19 NOTE — Patient Instructions (Addendum)
Access Code: [email or web address]  URL: [email or web address]  Date: 11/19/2017  Prepared by: Lieutenant Diego   Exercises  Supine Diaphragmatic Breathing - 10 reps - 2 sets - 1x daily - 7x weekly  Supine Sidebending - 5 reps - 1 sets - 45 hold - 1x daily - 7x weekly

## 2017-11-24 ENCOUNTER — Encounter: Payer: Self-pay | Admitting: Physical Therapy

## 2017-11-24 ENCOUNTER — Ambulatory Visit: Payer: Medicare Other | Attending: Anesthesiology | Admitting: Physical Therapy

## 2017-11-24 DIAGNOSIS — M5416 Radiculopathy, lumbar region: Secondary | ICD-10-CM | POA: Insufficient documentation

## 2017-11-24 DIAGNOSIS — M4126 Other idiopathic scoliosis, lumbar region: Secondary | ICD-10-CM | POA: Diagnosis not present

## 2017-11-24 NOTE — Therapy (Signed)
[facility] [address] [place], [address] Phone: [phone]   Fax:  [phone]  Physical Therapy Treatment  Patient Details  Name: Colleen Payne MRN: [id number] Date of Birth: 04/10/1950 Referring Provider (PT): Jonelle Sidle Lobonc   Encounter Date: 11/24/2017  PT End of Session - 11/24/17 1100    Visit Number  2    Number of Visits  16    Date for PT Re-Evaluation  01/14/18    Authorization Type  Medicare    Authorization Time Period  2/10    PT Start Time  1030    PT Stop Time  1115    PT Time Calculation (min)  45 min    Activity Tolerance  Patient tolerated treatment well    Behavior During Therapy  [facility] for tasks assessed/performed       Past Medical History:  Diagnosis Date  . Anxiety   . Atrial fibrillation ([place])   . Depression   . Diabetes mellitus without complication (HCC)    Type I   . Headache    stress. 1x/month  . Hypertension   . Insulin pump in place   . Motion sickness    cars  . MVP (mitral valve prolapse)   . Neuromuscular disorder (HCC)    leg weakness s/p back surgeries  . Thyroid disease     Past Surgical History:  Procedure Laterality Date  . BACK SURGERY  2004   3 sugeries between Sept and Nov, fusion L2-S1  . CATARACT EXTRACTION W/PHACO Left 11/23/2014   Procedure: CATARACT EXTRACTION PHACO AND INTRAOCULAR LENS PLACEMENT (IOC);  Surgeon: Leandrew Koyanagi, MD;  Location: [facility];  Service: Ophthalmology;  Laterality: Left;  DIABETIC - insulin pump    There were no vitals filed for this visit.  Subjective Assessment - 11/24/17 1032    Subjective  Patient reports she had some pain relief the 24hours following manual techniques from evaluation. Patient continues report consistent LBP 8/10 today/through the weekend. Patient reports compliance with her HEP    Pertinent History  Patient is 67 yo female that complains of lumbar pain and inability to  walk erect, as well as hip/abdominal pain. Patient with history of lumbar fusion (L2-S1). Xray results show: Fairly stable appearance of the levoscoliosis of the lower thoracic and of the lumbar spine.Stable lateral subluxation toward the left of L2 with respect L3. Stable partial compression of the body of L1. Patient complains of frustration with physicians, and that her symptoms are worsening over time. 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 Reported that her pain and difficulty worsened after trying to pick her husband up about 1 year ago. Patient mentioned that she has been told that she has "spinal fluid pockets that are leaking". PMH also includes DM I with insulin pump,  afib, HTN,  HLD, chronic pain syndrome, SOB.    Limitations  Standing;Walking;House hold activities;Other (comment)    Diagnostic tests  see exray results: Fairly stable appearance of the levoscoliosis of the lower thoracic and lumbar spine. Stable lateral subluxation toward left of L2 with respect of L3, stable partial compression of the body of L1.    Patient Stated Goals  pain relief, standing erect         Manual - STM to R lower tspine/upper lumbar paraspinals and R glute max/min/deep ER in supine with pillow under hips   Ther-Ex - Supine lying 62min, progressed  to Modified thomas stretch with pillow prop upper back/cervical support 33min each side (added to HEP) for hip flexor stretch (initially patient unable to lay flat without hip flex d/t inc hip flexor tension) - Seated thoracic ext x20 with towel roll (added to HEP) - Standing with forearms supported hip ext and hip abd 2x 10  EACH with TC for glute contraction and maintained erect posture. Prolonged rest between sets - Education on length tension relationship as it relates to posture and the importance of lengthening ant musculature and strengthening of posterior musculature to restore proper posture with analogies and  demonstration  Gait Training Gait trials with PT lowering patient's cane to appropriate height. Patient reports she had her cane "up higher so she does not have to lean forward as much to reach it". PT educated patient on proper cane height and to ext elbow with step to maintain upright position. PT also educated patient to utilize cane in LUE instead of right as patient continues R trunk lean with cane in RUE. Cuing during gait for upright position (as much as available)                    PT Education - 11/24/17 1038    Education Details  exercise form, postural anatomy education (length/tension relationship)    Person(s) Educated  Patient    Methods  Explanation;Verbal cues;Handout;Tactile cues;Demonstration    Comprehension  Verbalized understanding;Verbal cues required;Tactile cues required;Returned demonstration       PT Short Term Goals - 11/19/17 1253      PT SHORT TERM GOAL #1   Title  Patient will be adherent to initial HEP at least 3x a week to improve functional strength and balance for better safety at home.    Time  4    Period  Weeks    Status  New    Target Date  12/17/17        PT Long Term Goals - 11/19/17 1255      PT LONG TERM GOAL #1   Title   Patient will be independent in bending down towards floor and picking up small object (<5 pounds) and then stand back up with pain 5/10 or less to improve ability to pick up and clean up room at home    Baseline  pain ranges from 5-10/10    Time  8    Period  Weeks    Status  New    Target Date  01/14/18      PT LONG TERM GOAL #2   Title  Patient will increase BLE gross strength to 4+/5 as to improve functional strength for independent gait, increased standing tolerance and increased ADL ability.    Time  8    Period  Weeks    Status  New    Target Date  01/14/18      PT LONG TERM GOAL #3   Title  Patient will report a worst pain of 5/10 80% of the time to improve tolerance with ADLs and reduced  symptoms with activities.      Time  8    Period  Weeks    Status  New    Target Date  01/14/18      PT LONG TERM GOAL #4   Title  Patient will demonstrate an improved tolerance and ability to achieve erect standing posture to achieve patient stated goals as well as improve ability to perform functional activities.     Time  8    Period  Weeks    Status  New    Target Date  01/14/18            Plan - 11/24/17 1240    Clinical Impression Statement  PT spent time educating patient on postural lengthening/tension relationship and the importance of stretching to prevent contractures. PT initiated glute activation as well, and attempted to correct gait mechanics for carry over of muscle activiation/muscle lengthening. Patient is able to complete all therex with as much accuracy as possible following PT cuing, and verbalizes understanding of all provided education.     Rehab Potential  Fair    Clinical Impairments Affecting Rehab Potential  comorbidities, symptom duration, curvature degree    PT Frequency  2x / week    PT Duration  8 weeks    PT Treatment/Interventions  ADLs/Self Care Home Management;Aquatic Therapy;Cryotherapy;Ultrasound;Parrafin;Traction;Moist Heat;Electrical Stimulation;DME Instruction;Gait training;Stair training;Functional mobility training;Neuromuscular re-education;Balance training;Therapeutic exercise;Therapeutic activities;Patient/family education;Energy conservation;Spinal Manipulations;Joint Manipulations;Passive range of motion;Dry needling;Manual techniques    PT Next Visit Plan  FOTO; hip flexor lengthening/glute strengthening, core control, attempt bridge    PT Home Exercise Plan  see pt instruction section: diaphragmatic breathing in supine, supine R SB and lat stretch    Consulted and Agree with Plan of Care  Patient       Patient will benefit from skilled therapeutic intervention in order to improve the following deficits and impairments:  Abnormal gait,  Decreased balance, Decreased endurance, Decreased mobility, Difficulty walking, Hypomobility, Increased muscle spasms, Decreased range of motion, Pain, Postural dysfunction, Impaired flexibility, Increased fascial restricitons, Decreased strength, Decreased activity tolerance  Visit Diagnosis: Other idiopathic scoliosis, lumbar region     Problem List Patient Active Problem List   Diagnosis Date Noted  . Central sleep apnea 10/09/2016  . Hypertension 10/09/2016  . Lumbar postlaminectomy syndrome 10/09/2016  . Lumbar radiculopathy 10/09/2016  . Diabetic polyneuropathy ([place]) 10/07/2016  . Right lumbosacral radiculopathy 10/07/2016  . Healthcare maintenance 08/26/2016  . DKA (diabetic ketoacidoses) ([place]) 06/14/2016  . Atypical chest pain 02/23/2016  . SOB (shortness of breath) 02/23/2016  . Pain medication agreement signed 01/13/2015  . Acquired hypothyroidism 05/30/2014  . Frontal sinusitis 12/17/2013  . Atrial fibrillation ([place]) 11/29/2013  . Major depression in remission ([place]) 11/20/2013  . 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 Chronic pain syndrome 08/27/2013  . Back pain 07/17/2013  . HTN (hypertension), benign 07/17/2013  . Hyperlipidemia 07/17/2013  . Pancreatic insufficiency 07/17/2013  . Chronic postoperative pain 01/01/2013  . Long term current use of opiate analgesic 11/04/2012  . Hypothyroidism 08/25/2012  . Radiculopathy of leg 05/01/2011   Shelton Silvas PT, DPT Shelton Silvas 11/24/2017, 12:45 PM  [facility] [address], [place], [address] Phone: [phone]   Fax:  [phone]  Name: Colleen Payne MRN: [id number] Date of Birth: [birth year]

## 2017-11-28 ENCOUNTER — Ambulatory Visit: Payer: Medicare Other | Admitting: Physical Therapy

## 2017-11-28 ENCOUNTER — Encounter: Payer: Self-pay | Admitting: Physical Therapy

## 2017-11-28 DIAGNOSIS — M4126 Other idiopathic scoliosis, lumbar region: Secondary | ICD-10-CM

## 2017-11-28 NOTE — Therapy (Signed)
[facility] [address] [place], [address] Phone: [phone]   Fax:  [phone]  Physical Therapy Treatment  Patient Details  Name: Colleen Payne MRN: [id number] Date of Birth: 06-Dec-1950 Referring Provider (PT): Jonelle Sidle Lobonc   Encounter Date: 11/28/2017  PT End of Session - 11/28/17 1129    Visit Number  3    Number of Visits  16    Date for PT Re-Evaluation  01/14/18    PT Start Time  1000    PT Stop Time  1057    PT Time Calculation (min)  57 min    Activity Tolerance  Patient tolerated treatment well    Behavior During Therapy  [facility] for tasks assessed/performed       Past Medical History:  Diagnosis Date  . Anxiety   . Atrial fibrillation ([place])   . Depression   . Diabetes mellitus without complication (HCC)    Type I   . Headache    stress. 1x/month  . Hypertension   . Insulin pump in place   . Motion sickness    cars  . MVP (mitral valve prolapse)   . Neuromuscular disorder (HCC)    leg weakness s/p back surgeries  . Thyroid disease     Past Surgical History:  Procedure Laterality Date  . BACK SURGERY  2004   3 sugeries between Sept and Nov, fusion L2-S1  . CATARACT EXTRACTION W/PHACO Left 11/23/2014   Procedure: CATARACT EXTRACTION PHACO AND INTRAOCULAR LENS PLACEMENT (IOC);  Surgeon: Leandrew Koyanagi, MD;  Location: [facility];  Service: Ophthalmology;  Laterality: Left;  DIABETIC - insulin pump    There were no vitals filed for this visit.  Subjective Assessment - 11/28/17 1002    Subjective  Patient reports she felt "very good" 2 days following session, and was able to complete her HEP during this time and reports she was "holding herself up very good". Patient reports yesterday she woke up "very stiff" and has been having difficulty standing upright with pain ever since.     Pertinent History  Patient is 67 yo female that complains of lumbar pain and  inability to walk erect, as well as hip/abdominal pain. Patient with history of lumbar fusion (L2-S1). Xray results show: Fairly stable appearance of the levoscoliosis of the lower thoracic and of the lumbar spine.Stable lateral subluxation toward the left of L2 with respect L3. Stable partial compression of the body of L1. Patient complains of frustration with physicians, and that her symptoms are worsening over time. 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 Reported that her pain and difficulty worsened after trying to pick her husband up about 1 year ago. Patient mentioned that she has been told that she has "spinal fluid pockets that are leaking". PMH also includes DM I with insulin pump,  afib, HTN,  HLD, chronic pain syndrome, SOB.    Limitations  Standing;Walking;House hold activities;Other (comment)    Diagnostic tests  see exray results: Fairly stable appearance of the levoscoliosis of the lower thoracic and lumbar spine. Stable lateral subluxation toward left of L2 with respect of L3, stable partial compression of the body of L1.    Patient Stated Goals  pain relief, standing erect    Pain Onset  More than a month ago    Pain Onset  More than a month ago  Manual - STM to R lower tspine/upper lumbar paraspinals and R glute max/min/deep ER in supine with pillow under hips   Ther-Ex - Supine lying 35min, progressed to Modified thomas stretch with pillow prop upper back/cervical support 16min each side for hip flexor stretch; PT overpressure for inc motion over 4 mins - Posterior pelvic tilt x10 with 3sec hold - Seated thoracic ext x20 with towel roll with min cuing for proper posture. Following, progressed to sitting in this position + bilat scap retractions with RTB x10; BTB x10 with min cuing to prevent shoulder hiking - Standing with forearms supported hip ext and hip abd 2x 10  EACH with TC for glute contraction and maintained erect  posture  Prolonged rest between sets  Therapeutic activity: Demonstrated caregiver rolling with sheet to change husband. PT used staff as husband and demonstrated "pulling sheet" to roll staff over to complete changing. PT then allowed patient chance to demonstrate transferring, which patient was able to complete with accuracy with chair behind for safety and min PT cuing. PT encouraged patient to carry this over to home to promote better carry over from session and HEP with functional movement.                      PT Education - 11/28/17 1125    Education Details  Exercise form; transfers with proper body mechanics, HEP review    Person(s) Educated  Patient    Methods  Explanation;Verbal cues;Demonstration    Comprehension  Verbalized understanding;Verbal cues required;Returned demonstration       PT Short Term Goals - 11/19/17 1253      PT SHORT TERM GOAL #1   Title  Patient will be adherent to initial HEP at least 3x a week to improve functional strength and balance for better safety at home.    Time  4    Period  Weeks    Status  New    Target Date  12/17/17        PT Long Term Goals - 11/19/17 1255      PT LONG TERM GOAL #1   Title   Patient will be independent in bending down towards floor and picking up small object (<5 pounds) and then stand back up with pain 5/10 or less to improve ability to pick up and clean up room at home    Baseline  pain ranges from 5-10/10    Time  8    Period  Weeks    Status  New    Target Date  01/14/18      PT LONG TERM GOAL #2   Title  Patient will increase BLE gross strength to 4+/5 as to improve functional strength for independent gait, increased standing tolerance and increased ADL ability.    Time  8    Period  Weeks    Status  New    Target Date  01/14/18      PT LONG TERM GOAL #3   Title  Patient will report a worst pain of 5/10 80% of the time to improve tolerance with ADLs and reduced symptoms with  activities.      Time  8    Period  Weeks    Status  New    Target Date  01/14/18      PT LONG TERM GOAL #4   Title  Patient will demonstrate an improved tolerance and ability to achieve erect standing posture to achieve patient stated goals as  well as improve ability to perform functional activities.     Time  8    Period  Weeks    Status  New    Target Date  01/14/18            Plan - 11/28/17 1142    Clinical Impression Statement  Patient reports that she felt better after last session until yesterday when she attempted to change her incapacitated husband and had to "bend forward to roll him". PT led patient through roll transferring to change a patient technique with proper body mechniques. Patient is able to demonstrate with accuracy with chair behind her for safety, although she does lack some strength to complete without LOB (but is able to steady herself). PT will continue to work on hip/back ext and core strength to promot proper posture and body mechanics.     Rehab Potential  Fair    Clinical Impairments Affecting Rehab Potential  comorbidities, symptom duration, curvature degree    PT Frequency  2x / week    PT Duration  8 weeks    PT Treatment/Interventions  ADLs/Self Care Home Management;Aquatic Therapy;Cryotherapy;Ultrasound;Parrafin;Traction;Moist Heat;Electrical Stimulation;DME Instruction;Gait training;Stair training;Functional mobility training;Neuromuscular re-education;Balance training;Therapeutic exercise;Therapeutic activities;Patient/family education;Energy conservation;Spinal Manipulations;Joint Manipulations;Passive range of motion;Dry needling;Manual techniques    PT Next Visit Plan  FOTO; hip flexor lengthening/glute strengthening, core control, attempt bridge    PT Home Exercise Plan  seated thoracic ext, see pt instruction section: diaphragmatic breathing in supine, supine R SB and lat stretch    Consulted and Agree with Plan of Care  Patient        Patient will benefit from skilled therapeutic intervention in order to improve the following deficits and impairments:  Abnormal gait, Decreased balance, Decreased endurance, Decreased mobility, Difficulty walking, Hypomobility, Increased muscle spasms, Decreased range of motion, Pain, Postural dysfunction, Impaired flexibility, Increased fascial restricitons, Decreased strength, Decreased activity tolerance  Visit Diagnosis: Other idiopathic scoliosis, lumbar region     Problem List Patient Active Problem List   Diagnosis Date Noted  . Central sleep apnea 10/09/2016  . Hypertension 10/09/2016  . Lumbar postlaminectomy syndrome 10/09/2016  . Lumbar radiculopathy 10/09/2016  . Diabetic polyneuropathy ([place]) 10/07/2016  . Right lumbosacral radiculopathy 10/07/2016  . Healthcare maintenance 08/26/2016  . DKA (diabetic ketoacidoses) ([place]) 06/14/2016  . Atypical chest pain 02/23/2016  . SOB (shortness of breath) 02/23/2016  . Pain medication agreement signed 01/13/2015  . Acquired hypothyroidism 05/30/2014  . Frontal sinusitis 12/17/2013  . Atrial fibrillation ([place]) 11/29/2013  . Major depression in remission ([place]) 11/20/2013  . 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 Chronic pain syndrome 08/27/2013  . Back pain 07/17/2013  . HTN (hypertension), benign 07/17/2013  . Hyperlipidemia 07/17/2013  . Pancreatic insufficiency 07/17/2013  . Chronic postoperative pain 01/01/2013  . Long term current use of opiate analgesic 11/04/2012  . Hypothyroidism 08/25/2012  . Radiculopathy of leg 05/01/2011   Shelton Silvas PT, DPT Shelton Silvas 11/28/2017, 11:58 AM  [facility] [address], [place], [address] Phone: [phone]   Fax:  [phone]  Name: Colleen Payne MRN: [id number] Date of Birth: 1950-10-04

## 2017-12-01 ENCOUNTER — Ambulatory Visit: Payer: Medicare Other | Admitting: Physical Therapy

## 2017-12-01 ENCOUNTER — Encounter: Payer: Self-pay | Admitting: Physical Therapy

## 2017-12-01 DIAGNOSIS — M4126 Other idiopathic scoliosis, lumbar region: Secondary | ICD-10-CM

## 2017-12-01 NOTE — Therapy (Signed)
[facility] [address], [place], [address] Phone: [phone]   Fax:  [phone]  Physical Therapy Treatment  Patient Details  Name: Colleen Payne MRN: [id number] Date of Birth: 13-Dec-1950 Referring Provider (PT): Jonelle Sidle Lobonc   Encounter Date: 12/01/2017  PT End of Session - 12/01/17 1124    Visit Number  4    Number of Visits  16    Date for PT Re-Evaluation  01/14/18    Authorization Type  Medicare    Authorization Time Period  3/10    PT Start Time  1115    PT Stop Time  1200    PT Time Calculation (min)  45 min    Activity Tolerance  Patient tolerated treatment well    Behavior During Therapy  [facility] for tasks assessed/performed       Past Medical History:  Diagnosis Date  . Anxiety   . Atrial fibrillation ([place])   . Depression   . Diabetes mellitus without complication (HCC)    Type I   . Headache    stress. 1x/month  . Hypertension   . Insulin pump in place   . Motion sickness    cars  . MVP (mitral valve prolapse)   . Neuromuscular disorder (HCC)    leg weakness s/p back surgeries  . Thyroid disease     Past Surgical History:  Procedure Laterality Date  . BACK SURGERY  2004   3 sugeries between Sept and Nov, fusion L2-S1  . CATARACT EXTRACTION W/PHACO Left 11/23/2014   Procedure: CATARACT EXTRACTION PHACO AND INTRAOCULAR LENS PLACEMENT (IOC);  Surgeon: Leandrew Koyanagi, MD;  Location: [facility];  Service: Ophthalmology;  Laterality: Left;  DIABETIC - insulin pump    There were no vitals filed for this visit.  Subjective Assessment - 12/01/17 1118    Subjective  Patient reports she has felt "better" through the weekend and is pleased that she was able to stay a little more upright through the weekend. Patient reports 7/10 pain today, and reports she thinks the thoracic ext are helping with her pain.     Pertinent History  Patient is 67 yo female that  complains of lumbar pain and inability to walk erect, as well as hip/abdominal pain. Patient with history of lumbar fusion (L2-S1). Xray results show: Fairly stable appearance of the levoscoliosis of the lower thoracic and of the lumbar spine.Stable lateral subluxation toward the left of L2 with respect L3. Stable partial compression of the body of L1. Patient complains of frustration with physicians, and that her symptoms are worsening over time. 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 Reported that her pain and difficulty worsened after trying to pick her husband up about 1 year ago. Patient mentioned that she has been told that she has "spinal fluid pockets that are leaking". PMH also includes DM I with insulin pump,  afib, HTN,  HLD, chronic pain syndrome, SOB.    Limitations  Standing;Walking;House hold activities;Other (comment)    Diagnostic tests  see exray results: Fairly stable appearance of the levoscoliosis of the lower thoracic and lumbar spine. Stable lateral subluxation toward left of L2 with respect of L3, stable partial compression of the body of L1.    Patient Stated Goals  pain relief, standing erect    Pain Onset  More than a month ago    Pain Onset  More than a month ago  Ther-Ex - Modified thomas stretch with pillow prop upper back/cervical support 62min total each LE with inc ROM over 46min with PT overpressure - Seated thoracic ext x20 with towel roll (added to HEP) - Standing with forearms supported hip ext and hip abd 2x 10  EACH with TC for glute contraction and maintained erect posture. - Seated R trunk stretch with L lean 2x 30sec hold (HEP) - Seated thoracic ext with pillow x20; with PT resistance 2x 10 with resistance for concentric and eccentric movement - Seated rows blue tband 3x 10 with min cuing for eccentric control  - Standing ext from leaning forward onto support with PT assistance for over pressure 2x 10                      PT Education - 12/01/17 1123    Education Details  Exercise form    Person(s) Educated  Patient    Methods  Explanation;Demonstration;Verbal cues    Comprehension  Verbalized understanding;Returned demonstration;Verbal cues required       PT Short Term Goals - 11/19/17 1253      PT SHORT TERM GOAL #1   Title  Patient will be adherent to initial HEP at least 3x a week to improve functional strength and balance for better safety at home.    Time  4    Period  Weeks    Status  New    Target Date  12/17/17        PT Long Term Goals - 11/19/17 1255      PT LONG TERM GOAL #1   Title   Patient will be independent in bending down towards floor and picking up small object (<5 pounds) and then stand back up with pain 5/10 or less to improve ability to pick up and clean up room at home    Baseline  pain ranges from 5-10/10    Time  8    Period  Weeks    Status  New    Target Date  01/14/18      PT LONG TERM GOAL #2   Title  Patient will increase BLE gross strength to 4+/5 as to improve functional strength for independent gait, increased standing tolerance and increased ADL ability.    Time  8    Period  Weeks    Status  New    Target Date  01/14/18      PT LONG TERM GOAL #3   Title  Patient will report a worst pain of 5/10 80% of the time to improve tolerance with ADLs and reduced symptoms with activities.      Time  8    Period  Weeks    Status  New    Target Date  01/14/18      PT LONG TERM GOAL #4   Title  Patient will demonstrate an improved tolerance and ability to achieve erect standing posture to achieve patient stated goals as well as improve ability to perform functional activities.     Time  8    Period  Weeks    Status  New    Target Date  01/14/18            Plan - 12/01/17 1212    Clinical Impression Statement  Patient is pleased with progress through the weekend. Patient is able to complete therex for postural  restoration with TC/VC from PT. Patient is able to tolerate increased standing therex this session as well.  PT will continue to progress postural therex as able.     Rehab Potential  Fair    Clinical Impairments Affecting Rehab Potential  comorbidities, symptom duration, curvature degree    PT Frequency  2x / week    PT Duration  8 weeks    PT Treatment/Interventions  ADLs/Self Care Home Management;Aquatic Therapy;Cryotherapy;Ultrasound;Parrafin;Traction;Moist Heat;Electrical Stimulation;DME Instruction;Gait training;Stair training;Functional mobility training;Neuromuscular re-education;Balance training;Therapeutic exercise;Therapeutic activities;Patient/family education;Energy conservation;Spinal Manipulations;Joint Manipulations;Passive range of motion;Dry needling;Manual techniques    PT Next Visit Plan  FOTO; hip flexor lengthening/glute strengthening, core control, attempt bridge    PT Home Exercise Plan  seated thoracic ext, see pt instruction section: diaphragmatic breathing in supine, supine R SB and lat stretch    Consulted and Agree with Plan of Care  Patient       Patient will benefit from skilled therapeutic intervention in order to improve the following deficits and impairments:  Abnormal gait, Decreased balance, Decreased endurance, Decreased mobility, Difficulty walking, Hypomobility, Increased muscle spasms, Decreased range of motion, Pain, Postural dysfunction, Impaired flexibility, Increased fascial restricitons, Decreased strength, Decreased activity tolerance  Visit Diagnosis: Other idiopathic scoliosis, lumbar region     Problem List Patient Active Problem List   Diagnosis Date Noted  . Central sleep apnea 10/09/2016  . Hypertension 10/09/2016  . Lumbar postlaminectomy syndrome 10/09/2016  . Lumbar radiculopathy 10/09/2016  . Diabetic polyneuropathy ([place]) 10/07/2016  . Right lumbosacral radiculopathy 10/07/2016  . Healthcare maintenance 08/26/2016  . DKA (diabetic  ketoacidoses) ([place]) 06/14/2016  . Atypical chest pain 02/23/2016  . SOB (shortness of breath) 02/23/2016  . Pain medication agreement signed 01/13/2015  . Acquired hypothyroidism 05/30/2014  . Frontal sinusitis 12/17/2013  . Atrial fibrillation ([place]) 11/29/2013  . Major depression in remission ([place]) 11/20/2013  . 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 Chronic pain syndrome 08/27/2013  . Back pain 07/17/2013  . HTN (hypertension), benign 07/17/2013  . Hyperlipidemia 07/17/2013  . Pancreatic insufficiency 07/17/2013  . Chronic postoperative pain 01/01/2013  . Long term current use of opiate analgesic 11/04/2012  . Hypothyroidism 08/25/2012  . Radiculopathy of leg 05/01/2011   Shelton Silvas PT, DPT Shelton Silvas 12/01/2017, 12:25 PM  [facility] [address], [place], [address] Phone: [phone]   Fax:  [phone]  Name: CYANA SHOOK MRN: [id number] Date of Birth: 03-May-1950

## 2017-12-05 ENCOUNTER — Ambulatory Visit: Payer: Medicare Other

## 2017-12-05 ENCOUNTER — Encounter: Payer: Self-pay | Admitting: Physical Therapy

## 2017-12-05 DIAGNOSIS — M5416 Radiculopathy, lumbar region: Secondary | ICD-10-CM

## 2017-12-05 DIAGNOSIS — M4126 Other idiopathic scoliosis, lumbar region: Secondary | ICD-10-CM

## 2017-12-05 NOTE — Therapy (Signed)
[facility] [address], [place], [address] Phone: [phone]   Fax:  [phone]  Physical Therapy Treatment  Patient Details  Name: Colleen Payne MRN: [id number] Date of Birth: 1950-06-06 Referring Provider (PT): Jonelle Sidle Lobonc   Encounter Date: 12/05/2017  PT End of Session - 12/05/17 1001    Visit Number  5    Number of Visits  16    Date for PT Re-Evaluation  01/14/18    Authorization Type  Medicare    Authorization Time Period  4/10    PT Start Time  1000    PT Stop Time  1045    PT Time Calculation (min)  45 min    Activity Tolerance  Patient tolerated treatment well    Behavior During Therapy  [facility] for tasks assessed/performed       Past Medical History:  Diagnosis Date  . Anxiety   . Atrial fibrillation ([place])   . Depression   . Diabetes mellitus without complication (HCC)    Type I   . Headache    stress. 1x/month  . Hypertension   . Insulin pump in place   . Motion sickness    cars  . MVP (mitral valve prolapse)   . Neuromuscular disorder (HCC)    leg weakness s/p back surgeries  . Thyroid disease     Past Surgical History:  Procedure Laterality Date  . BACK SURGERY  2004   3 sugeries between Sept and Nov, fusion L2-S1  . CATARACT EXTRACTION W/PHACO Left 11/23/2014   Procedure: CATARACT EXTRACTION PHACO AND INTRAOCULAR LENS PLACEMENT (IOC);  Surgeon: Leandrew Koyanagi, MD;  Location: [facility];  Service: Ophthalmology;  Laterality: Left;  DIABETIC - insulin pump    There were no vitals filed for this visit.  Subjective Assessment - 12/05/17 1005    Subjective  Patient reports that at home she feels as if she does not do her exercises as well at home. States they make her feel stiffer.    Pertinent History  Patient is 67 yo female that complains of lumbar pain and inability to walk erect, as well as hip/abdominal pain. Patient with history of lumbar fusion  (L2-S1). Xray results show: Fairly stable appearance of the levoscoliosis of the lower thoracic and of the lumbar spine.Stable lateral subluxation toward the left of L2 with respect L3. Stable partial compression of the body of L1. Patient complains of frustration with physicians, and that her symptoms are worsening over time. 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 Reported that her pain and difficulty worsened after trying to pick her husband up about 1 year ago. Patient mentioned that she has been told that she has "spinal fluid pockets that are leaking". PMH also includes DM I with insulin pump,  afib, HTN,  HLD, chronic pain syndrome, SOB.    Limitations  Standing;Walking;House hold activities;Other (comment)    Diagnostic tests  see exray results: Fairly stable appearance of the levoscoliosis of the lower thoracic and lumbar spine. Stable lateral subluxation toward left of L2 with respect of L3, stable partial compression of the body of L1.    Patient Stated Goals  pain relief, standing erect    Currently in Pain?  Yes    Pain Score  8     Pain Location  Back    Pain Orientation  Left;Lower    Pain Descriptors / Indicators  Aching;Burning;Tightness  Pain Type  Chronic pain    Pain Onset  More than a month ago       Ther-Ex - Modified thomas stretch with pillow prop upper back/cervical support 73min total each LE with inc ROM over 58min with PT overpressure Open and close book stretch in s/l x7 with 1 breath hold - Seated thoracic ext x20 with towel roll (added to HEP) - Standing with forearms supported hip ext and hip abd 2x 10 EACH with TC for glute contraction and maintained erect posture. - Seated R trunk stretch with L lean 2x 30sec hold (HEP) - Seated thoracic extension isometric 5x5s Seated thoracic flexion isometric 5x5s Seated thoracic extension with mild overpressure form PT x10 - Seated rows blue tband 3x 10 with min cuing for  eccentric control  Standing hip extension and abduction with UE support x10 ea way bilaterally - Standing ext from table x12 with verbal cues from PT    PT Education - 12/05/17 1001    Education Details  exercise technique/form    Person(s) Educated  Patient    Methods  Explanation;Demonstration;Verbal cues;Tactile cues    Comprehension  Verbalized understanding;Returned demonstration       PT Short Term Goals - 11/19/17 1253      PT SHORT TERM GOAL #1   Title  Patient will be adherent to initial HEP at least 3x a week to improve functional strength and balance for better safety at home.    Time  4    Period  Weeks    Status  New    Target Date  12/17/17        PT Long Term Goals - 11/19/17 1255      PT LONG TERM GOAL #1   Title   Patient will be independent in bending down towards floor and picking up small object (<5 pounds) and then stand back up with pain 5/10 or less to improve ability to pick up and clean up room at home    Baseline  pain ranges from 5-10/10    Time  8    Period  Weeks    Status  New    Target Date  01/14/18      PT LONG TERM GOAL #2   Title  Patient will increase BLE gross strength to 4+/5 as to improve functional strength for independent gait, increased standing tolerance and increased ADL ability.    Time  8    Period  Weeks    Status  New    Target Date  01/14/18      PT LONG TERM GOAL #3   Title  Patient will report a worst pain of 5/10 80% of the time to improve tolerance with ADLs and reduced symptoms with activities.      Time  8    Period  Weeks    Status  New    Target Date  01/14/18      PT LONG TERM GOAL #4   Title  Patient will demonstrate an improved tolerance and ability to achieve erect standing posture to achieve patient stated goals as well as improve ability to perform functional activities.     Time  8    Period  Weeks    Status  New    Target Date  01/14/18            Plan - 12/05/17 1032    Clinical  Impression Statement  Patient needed intermittent verbal cues for therex form. Consistent cues  needed throughout session to maintain breathing. Patient with improved pain at end of session, decreased from 8/10 to 6/10.    PT Frequency  2x / week    PT Duration  8 weeks    PT Treatment/Interventions  ADLs/Self Care Home Management;Aquatic Therapy;Cryotherapy;Ultrasound;Parrafin;Traction;Moist Heat;Electrical Stimulation;DME Instruction;Gait training;Stair training;Functional mobility training;Neuromuscular re-education;Balance training;Therapeutic exercise;Therapeutic activities;Patient/family education;Energy conservation;Spinal Manipulations;Joint Manipulations;Passive range of motion;Dry needling;Manual techniques    PT Next Visit Plan  FOTO; hip flexor lengthening/glute strengthening, core control, attempt bridge    PT Home Exercise Plan  seated thoracic ext, see pt instruction section: diaphragmatic breathing in supine, supine R SB and lat stretch    Consulted and Agree with Plan of Care  Patient       Patient will benefit from skilled therapeutic intervention in order to improve the following deficits and impairments:  Abnormal gait, Decreased balance, Decreased endurance, Decreased mobility, Difficulty walking, Hypomobility, Increased muscle spasms, Decreased range of motion, Pain, Postural dysfunction, Impaired flexibility, Increased fascial restricitons, Decreased strength, Decreased activity tolerance  Visit Diagnosis: Other idiopathic scoliosis, lumbar region  Radiculopathy, lumbar region     Problem List Patient Active Problem List   Diagnosis Date Noted  . Central sleep apnea 10/09/2016  . Hypertension 10/09/2016  . Lumbar postlaminectomy syndrome 10/09/2016  . Lumbar radiculopathy 10/09/2016  . Diabetic polyneuropathy ([place]) 10/07/2016  . Right lumbosacral radiculopathy 10/07/2016  . Healthcare maintenance 08/26/2016  . DKA (diabetic ketoacidoses) ([place]) 06/14/2016  .  Atypical chest pain 02/23/2016  . SOB (shortness of breath) 02/23/2016  . Pain medication agreement signed 01/13/2015  . Acquired hypothyroidism 05/30/2014  . Frontal sinusitis 12/17/2013  . Atrial fibrillation ([place]) 11/29/2013  . Major depression in remission ([place]) 11/20/2013  . 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 Chronic pain syndrome 08/27/2013  . Back pain 07/17/2013  . HTN (hypertension), benign 07/17/2013  . Hyperlipidemia 07/17/2013  . Pancreatic insufficiency 07/17/2013  . Chronic postoperative pain 01/01/2013  . Long term current use of opiate analgesic 11/04/2012  . Hypothyroidism 08/25/2012  . Radiculopathy of leg 05/01/2011    Lieutenant Diego PT, DPT 10:56 AM,12/05/17 [facility] [address] [place], [address] Phone: [phone]   Fax:  [phone]  Name: MELONY TENPAS MRN: [id number] Date of Birth: 10-Aug-1950

## 2017-12-08 ENCOUNTER — Ambulatory Visit: Payer: Medicare Other | Admitting: Physical Therapy

## 2017-12-12 ENCOUNTER — Ambulatory Visit: Payer: Medicare Other | Admitting: Physical Therapy

## 2017-12-12 ENCOUNTER — Encounter: Payer: Self-pay | Admitting: Physical Therapy

## 2017-12-12 DIAGNOSIS — M4126 Other idiopathic scoliosis, lumbar region: Secondary | ICD-10-CM

## 2017-12-12 NOTE — Therapy (Signed)
[facility] [address] [place], [address] Phone: [phone]   Fax:  [phone]  Physical Therapy Treatment  Patient Details  Name: Colleen Payne MRN: [id number] Date of Birth: [birth year] Referring Provider (PT): Jonelle Sidle Lobonc   Encounter Date: 12/12/2017  PT End of Session - 12/12/17 1040    Visit Number  6    Number of Visits  16    Date for PT Re-Evaluation  01/14/18    Authorization Type  Medicare    Authorization Time Period  5/10    PT Start Time  1030    PT Stop Time  1125    PT Time Calculation (min)  55 min    Activity Tolerance  Patient tolerated treatment well    Behavior During Therapy  [facility] for tasks assessed/performed       Past Medical History:  Diagnosis Date  . Anxiety   . Atrial fibrillation ([place])   . Depression   . Diabetes mellitus without complication (HCC)    Type I   . Headache    stress. 1x/month  . Hypertension   . Insulin pump in place   . Motion sickness    cars  . MVP (mitral valve prolapse)   . Neuromuscular disorder (HCC)    leg weakness s/p back surgeries  . Thyroid disease     Past Surgical History:  Procedure Laterality Date  . BACK SURGERY  2004   3 sugeries between Sept and Nov, fusion L2-S1  . CATARACT EXTRACTION W/PHACO Left 11/23/2014   Procedure: CATARACT EXTRACTION PHACO AND INTRAOCULAR LENS PLACEMENT (IOC);  Surgeon: Leandrew Koyanagi, MD;  Location: [facility];  Service: Ophthalmology;  Laterality: Left;  DIABETIC - insulin pump    There were no vitals filed for this visit.  Subjective Assessment - 12/12/17 1036    Subjective  Patient reports she was sick over the weekend through the beginning of the week. Patient reports she has returned to HEP a "couple" days ago". Patient reports she feels as though she is very tense at home, from fear of her husband "needing her and that she may hurt herself". Patient reports she has  been able to implement the "pull with sheet" transferring method. Patient reports increased back pain 8/10 today.     Pertinent History  Patient is 67 yo female that complains of lumbar pain and inability to walk erect, as well as hip/abdominal pain. Patient with history of lumbar fusion (L2-S1). Xray results show: Fairly stable appearance of the levoscoliosis of the lower thoracic and of the lumbar spine.Stable lateral subluxation toward the left of L2 with respect L3. Stable partial compression of the body of L1. Patient complains of frustration with physicians, and that her symptoms are worsening over time. 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 Reported that her pain and difficulty worsened after trying to pick her husband up about 1 year ago. Patient mentioned that she has been told that she has "spinal fluid pockets that are leaking". PMH also includes DM I with insulin pump,  afib, HTN,  HLD, chronic pain syndrome, SOB.    Limitations  Standing;Walking;House hold activities;Other (comment)    Diagnostic tests  see exray results: Fairly stable appearance of the levoscoliosis of the lower thoracic and lumbar spine. Stable lateral subluxation toward left of L2 with respect of L3, stable partial compression of the body of L1.    Patient  Stated Goals  pain relief, standing erect    Pain Onset  More than a month ago        Ther-Ex - Modified thomas stretch with pillow prop upper back/cervical support53min total each LE with inc ROM over 32min with PT overpressure - Demo with multiple TC and VC with practice of STS from elevated mat table with glute activation without "lean forward" and knee ext without hip ext. PT utilized gait belt to assist in STS with minA dec to CGA for proper form. Following instruction patient is able to complete 3x 6 - Standing hip extension and abduction with UE support 3 x10 ea way bilaterally; changing hip ext to "donkey kick" form  with TC at glute to ensure proper activation - Standing thoracic ext from table with UE crossed 3x 6; TC from PT at thoracic spine and tactile stabilization of hips, with PT giving overpressure at the max of reps - Seated (with towel roll at tspine for thoracic ext) rows BTB 3x 10 with cuing for scapular retraction without shoulder hiking with good carry over                          PT Education - 12/12/17 1040    Education Details  Exercise form, postural cuing    Person(s) Educated  Patient    Methods  Explanation;Verbal cues    Comprehension  Verbalized understanding;Verbal cues required       PT Short Term Goals - 11/19/17 1253      PT SHORT TERM GOAL #1   Title  Patient will be adherent to initial HEP at least 3x a week to improve functional strength and balance for better safety at home.    Time  4    Period  Weeks    Status  New    Target Date  12/17/17        PT Long Term Goals - 11/19/17 1255      PT LONG TERM GOAL #1   Title   Patient will be independent in bending down towards floor and picking up small object (<5 pounds) and then stand back up with pain 5/10 or less to improve ability to pick up and clean up room at home    Baseline  pain ranges from 5-10/10    Time  8    Period  Weeks    Status  New    Target Date  01/14/18      PT LONG TERM GOAL #2   Title  Patient will increase BLE gross strength to 4+/5 as to improve functional strength for independent gait, increased standing tolerance and increased ADL ability.    Time  8    Period  Weeks    Status  New    Target Date  01/14/18      PT LONG TERM GOAL #3   Title  Patient will report a worst pain of 5/10 80% of the time to improve tolerance with ADLs and reduced symptoms with activities.      Time  8    Period  Weeks    Status  New    Target Date  01/14/18      PT LONG TERM GOAL #4   Title  Patient will demonstrate an improved tolerance and ability to achieve erect standing  posture to achieve patient stated goals as well as improve ability to perform functional activities.     Time  8  Period  Weeks    Status  New    Target Date  01/14/18            Plan - 12/12/17 1132    Clinical Impression Statement  PT progressed therex with increased functional movement. PT encouraged patient to continue STS with form extablished today for glute activation, which patient demonstrated understanding of. Patient is able to complete all therex progression with proper form after cuing from PT, with rest breaks between sets.     Rehab Potential  Fair    Clinical Impairments Affecting Rehab Potential  comorbidities, symptom duration, curvature degree    PT Frequency  2x / week    PT Duration  8 weeks    PT Treatment/Interventions  ADLs/Self Care Home Management;Aquatic Therapy;Cryotherapy;Ultrasound;Parrafin;Traction;Moist Heat;Electrical Stimulation;DME Instruction;Gait training;Stair training;Functional mobility training;Neuromuscular re-education;Balance training;Therapeutic exercise;Therapeutic activities;Patient/family education;Energy conservation;Spinal Manipulations;Joint Manipulations;Passive range of motion;Dry needling;Manual techniques    PT Next Visit Plan  FOTO; hip flexor lengthening/glute strengthening, core control, attempt bridge    PT Home Exercise Plan  seated thoracic ext, see pt instruction section: diaphragmatic breathing in supine, supine R SB and lat stretch    Consulted and Agree with Plan of Care  Patient       Patient will benefit from skilled therapeutic intervention in order to improve the following deficits and impairments:  Abnormal gait, Decreased balance, Decreased endurance, Decreased mobility, Difficulty walking, Hypomobility, Increased muscle spasms, Decreased range of motion, Pain, Postural dysfunction, Impaired flexibility, Increased fascial restricitons, Decreased strength, Decreased activity tolerance  Visit Diagnosis: Other  idiopathic scoliosis, lumbar region     Problem List Patient Active Problem List   Diagnosis Date Noted  . Central sleep apnea 10/09/2016  . Hypertension 10/09/2016  . Lumbar postlaminectomy syndrome 10/09/2016  . Lumbar radiculopathy 10/09/2016  . Diabetic polyneuropathy ([place]) 10/07/2016  . Right lumbosacral radiculopathy 10/07/2016  . Healthcare maintenance 08/26/2016  . DKA (diabetic ketoacidoses) ([place]) 06/14/2016  . Atypical chest pain 02/23/2016  . SOB (shortness of breath) 02/23/2016  . Pain medication agreement signed 01/13/2015  . Acquired hypothyroidism 05/30/2014  . Frontal sinusitis 12/17/2013  . Atrial fibrillation ([place]) 11/29/2013  . Major depression in remission ([place]) 11/20/2013  . 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 Chronic pain syndrome 08/27/2013  . Back pain 07/17/2013  . HTN (hypertension), benign 07/17/2013  . Hyperlipidemia 07/17/2013  . Pancreatic insufficiency 07/17/2013  . Chronic postoperative pain 01/01/2013  . Long term current use of opiate analgesic 11/04/2012  . Hypothyroidism 08/25/2012  . Radiculopathy of leg 05/01/2011   Shelton Silvas PT, DPT Shelton Silvas 12/12/2017, 11:35 AM  [facility] [address] [place], [address] Phone: [phone]   Fax:  [phone]  Name: Colleen Payne MRN: [id number] Date of Birth: [birth year]

## 2017-12-15 ENCOUNTER — Encounter: Payer: Self-pay | Admitting: Physical Therapy

## 2017-12-15 ENCOUNTER — Ambulatory Visit: Payer: Medicare Other | Admitting: Physical Therapy

## 2017-12-15 DIAGNOSIS — M4126 Other idiopathic scoliosis, lumbar region: Secondary | ICD-10-CM

## 2017-12-15 NOTE — Therapy (Signed)
[facility] [address], [place], [address] Phone: [phone]   Fax:  [phone]  Physical Therapy Treatment  Patient Details  Name: Colleen Payne MRN: [id number] Date of Birth: [birth year] Referring Provider (PT): Jonelle Sidle Lobonc   Encounter Date: 12/15/2017  PT End of Session - 12/15/17 1135    Visit Number  7    Number of Visits  16    Date for PT Re-Evaluation  01/14/18    Authorization Type  Medicare    Authorization Time Period  6/10    PT Start Time  1115    PT Stop Time  1200    PT Time Calculation (min)  45 min    Activity Tolerance  Patient tolerated treatment well    Behavior During Therapy  [facility] for tasks assessed/performed       Past Medical History:  Diagnosis Date  . Anxiety   . Atrial fibrillation ([place])   . Depression   . Diabetes mellitus without complication (HCC)    Type I   . Headache    stress. 1x/month  . Hypertension   . Insulin pump in place   . Motion sickness    cars  . MVP (mitral valve prolapse)   . Neuromuscular disorder (HCC)    leg weakness s/p back surgeries  . Thyroid disease     Past Surgical History:  Procedure Laterality Date  . BACK SURGERY  2004   3 sugeries between Sept and Nov, fusion L2-S1  . CATARACT EXTRACTION W/PHACO Left 11/23/2014   Procedure: CATARACT EXTRACTION PHACO AND INTRAOCULAR LENS PLACEMENT (IOC);  Surgeon: Leandrew Koyanagi, MD;  Location: [facility];  Service: Ophthalmology;  Laterality: Left;  DIABETIC - insulin pump    There were no vitals filed for this visit.  Subjective Assessment - 12/15/17 1123    Subjective  Patient reports 7/10 pain today, which she reports is "better". Patient reports compliance with HEP with no questions or concerns.     Pertinent History  Patient is 67 yo female that complains of lumbar pain and inability to walk erect, as well as hip/abdominal pain. Patient with history of lumbar  fusion (L2-S1). Xray results show: Fairly stable appearance of the levoscoliosis of the lower thoracic and of the lumbar spine.Stable lateral subluxation toward the left of L2 with respect L3. Stable partial compression of the body of L1. Patient complains of frustration with physicians, and that her symptoms are worsening over time. 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 Reported that her pain and difficulty worsened after trying to pick her husband up about 1 year ago. Patient mentioned that she has been told that she has "spinal fluid pockets that are leaking". PMH also includes DM I with insulin pump,  afib, HTN,  HLD, chronic pain syndrome, SOB.    Limitations  Standing;Walking;House hold activities;Other (comment)    Diagnostic tests  see exray results: Fairly stable appearance of the levoscoliosis of the lower thoracic and lumbar spine. Stable lateral subluxation toward left of L2 with respect of L3, stable partial compression of the body of L1.    Patient Stated Goals  pain relief, standing erect    Pain Onset  More than a month ago    Pain Onset  More than a month ago      Ther-Ex - Bilat hip flexor stretch leaning forward on ball 34min - From modified qped with ball;  bilat rows x10; 2# DB in each hand x10; 3# each hand x10 - From modified qped with ball; bilat T x10; 2# DB in each hand 2 x10 - Stepping backwards over 67ft x3 with bilat HHA with patient pushing into therapist hands to promote "standing up tall" - STS from elevated mat table 68x 8 with min VC and TC for first set with good carry over following - Standing thoracic extw/ scap retractionfrom table with UE crossed 3x 8;  tactile stabilization of hips, with PT giving overpressure into ext at the max range of reps                           PT Education - 12/15/17 1135    Education Details  Exercise form    Person(s) Educated  Patient    Methods   Explanation;Tactile cues;Verbal cues    Comprehension  Verbalized understanding;Verbal cues required;Tactile cues required       PT Short Term Goals - 11/19/17 1253      PT SHORT TERM GOAL #1   Title  Patient will be adherent to initial HEP at least 3x a week to improve functional strength and balance for better safety at home.    Time  4    Period  Weeks    Status  New    Target Date  12/17/17        PT Long Term Goals - 11/19/17 1255      PT LONG TERM GOAL #1   Title   Patient will be independent in bending down towards floor and picking up small object (<5 pounds) and then stand back up with pain 5/10 or less to improve ability to pick up and clean up room at home    Baseline  pain ranges from 5-10/10    Time  8    Period  Weeks    Status  New    Target Date  01/14/18      PT LONG TERM GOAL #2   Title  Patient will increase BLE gross strength to 4+/5 as to improve functional strength for independent gait, increased standing tolerance and increased ADL ability.    Time  8    Period  Weeks    Status  New    Target Date  01/14/18      PT LONG TERM GOAL #3   Title  Patient will report a worst pain of 5/10 80% of the time to improve tolerance with ADLs and reduced symptoms with activities.      Time  8    Period  Weeks    Status  New    Target Date  01/14/18      PT LONG TERM GOAL #4   Title  Patient will demonstrate an improved tolerance and ability to achieve erect standing posture to achieve patient stated goals as well as improve ability to perform functional activities.     Time  8    Period  Weeks    Status  New    Target Date  01/14/18            Plan - 12/15/17 1210    Clinical Impression Statement  Patient is continuing to demonstrate increased postural carry over between sessions. Patient continues to need cuing for proper form with therex, but is demonstrating some carry over wtih proper muscle activation. PT continued to encourage patient that she is  making good progress as patient  reports she does get "down" on how long postural gains may take, and that she "wants to be able to stand up straight for increased periods of time". PT educated patient on timeline for strength gains, with encouragement to continue HEP for carry over, utilizing SAID principle; pt verbalized understanding.     Rehab Potential  Fair    Clinical Impairments Affecting Rehab Potential  comorbidities, symptom duration, curvature degree    PT Frequency  2x / week    PT Treatment/Interventions  ADLs/Self Care Home Management;Aquatic Therapy;Cryotherapy;Ultrasound;Parrafin;Traction;Moist Heat;Electrical Stimulation;DME Instruction;Gait training;Stair training;Functional mobility training;Neuromuscular re-education;Balance training;Therapeutic exercise;Therapeutic activities;Patient/family education;Energy conservation;Spinal Manipulations;Joint Manipulations;Passive range of motion;Dry needling;Manual techniques    PT Next Visit Plan  FOTO; hip flexor lengthening/glute strengthening, core control, attempt bridge    PT Home Exercise Plan  seated thoracic ext, see pt instruction section: diaphragmatic breathing in supine, supine R SB and lat stretch    Consulted and Agree with Plan of Care  Patient       Patient will benefit from skilled therapeutic intervention in order to improve the following deficits and impairments:  Abnormal gait, Decreased balance, Decreased endurance, Decreased mobility, Difficulty walking, Hypomobility, Increased muscle spasms, Decreased range of motion, Pain, Postural dysfunction, Impaired flexibility, Increased fascial restricitons, Decreased strength, Decreased activity tolerance  Visit Diagnosis: Other idiopathic scoliosis, lumbar region     Problem List Patient Active Problem List   Diagnosis Date Noted  . Central sleep apnea 10/09/2016  . Hypertension 10/09/2016  . Lumbar postlaminectomy syndrome 10/09/2016  . Lumbar radiculopathy  10/09/2016  . Diabetic polyneuropathy ([place]) 10/07/2016  . Right lumbosacral radiculopathy 10/07/2016  . Healthcare maintenance 08/26/2016  . DKA (diabetic ketoacidoses) ([place]) 06/14/2016  . Atypical chest pain 02/23/2016  . SOB (shortness of breath) 02/23/2016  . Pain medication agreement signed 01/13/2015  . Acquired hypothyroidism 05/30/2014  . Frontal sinusitis 12/17/2013  . Atrial fibrillation ([place]) 11/29/2013  . Major depression in remission ([place]) 11/20/2013  . 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 Chronic pain syndrome 08/27/2013  . Back pain 07/17/2013  . HTN (hypertension), benign 07/17/2013  . Hyperlipidemia 07/17/2013  . Pancreatic insufficiency 07/17/2013  . Chronic postoperative pain 01/01/2013  . Long term current use of opiate analgesic 11/04/2012  . Hypothyroidism 08/25/2012  . Radiculopathy of leg 05/01/2011   Shelton Silvas PT, DPT Shelton Silvas 12/15/2017, 12:52 PM  [facility] [address] [place], [address] Phone: [phone]   Fax:  [phone]  Name: JALASIA ESKRIDGE MRN: [id number] Date of Birth: 1950-01-30

## 2017-12-17 ENCOUNTER — Ambulatory Visit: Payer: Medicare Other | Admitting: Physical Therapy

## 2017-12-17 ENCOUNTER — Encounter: Payer: Self-pay | Admitting: Physical Therapy

## 2017-12-17 DIAGNOSIS — M4126 Other idiopathic scoliosis, lumbar region: Secondary | ICD-10-CM

## 2017-12-17 NOTE — Therapy (Signed)
[facility] [address], [place], [address] Phone: [phone]   Fax:  [phone]  Physical Therapy Treatment  Patient Details  Name: Colleen Payne MRN: [id number] Date of Birth: May 11, 1950 Referring Provider (PT): Jonelle Sidle Lobonc   Encounter Date: 12/17/2017  PT End of Session - 12/17/17 1127    Visit Number  8    Number of Visits  16    Date for PT Re-Evaluation  01/14/18    Authorization Type  Medicare    Authorization Time Period  7/10    PT Start Time  1107    PT Stop Time  1200    PT Time Calculation (min)  53 min    Activity Tolerance  Patient tolerated treatment well    Behavior During Therapy  [facility] for tasks assessed/performed       Past Medical History:  Diagnosis Date  . Anxiety   . Atrial fibrillation ([place])   . Depression   . Diabetes mellitus without complication (HCC)    Type I   . Headache    stress. 1x/month  . Hypertension   . Insulin pump in place   . Motion sickness    cars  . MVP (mitral valve prolapse)   . Neuromuscular disorder (HCC)    leg weakness s/p back surgeries  . Thyroid disease     Past Surgical History:  Procedure Laterality Date  . BACK SURGERY  2004   3 sugeries between Sept and Nov, fusion L2-S1  . CATARACT EXTRACTION W/PHACO Left 11/23/2014   Procedure: CATARACT EXTRACTION PHACO AND INTRAOCULAR LENS PLACEMENT (IOC);  Surgeon: Leandrew Koyanagi, MD;  Location: [facility];  Service: Ophthalmology;  Laterality: Left;  DIABETIC - insulin pump    There were no vitals filed for this visit.  Subjective Assessment - 12/17/17 1114    Subjective  Patient reports she has had a hard week this week with caring for her husband. Patient reports 7/10 pain today. Patient reports minimal compliance with HEP over the past week d/t caring for her husband.     Limitations  Standing;Walking;House hold activities;Other (comment)    Diagnostic tests  see  exray results: Fairly stable appearance of the levoscoliosis of the lower thoracic and lumbar spine. Stable lateral subluxation toward left of L2 with respect of L3, stable partial compression of the body of L1.    Patient Stated Goals  pain relief, standing erect    Pain Onset  More than a month ago    Pain Onset  More than a month ago         Ther-Ex - Bilat hip flexor stretch leaning forward on ball 53min - Seated neutral rows 10# x10; 15# 2 x10 with towel roll at thoracic spine for ext cuing - Seated neutral rows 10# x10; 15# 2 x10 with towel roll at thoracic spine for ext cuing - Stepping backwards over 50ft x3 with bilat HHA with patient pushing into therapist hands to promote "standing up tall"; Adding YTB at ankles x20 ft with minA at gait belt and patient placing hands on PT shoulders; adding band at thighs as well x38ft - STS from elevated mat table 3x 10 with attempted "tap to table" as opposed "full sit"; with PT TC throughout for knee placement to ensure glute activation/proper form - Standingthoracicextw/ scap retractionfrom tablewith UE crossed 3x 10;  tactile stabilization at post hip, with PT giving overpressure into ext at the max range of  reps    PT Education - 12/17/17 1125    Education Details  Exercise form    Person(s) Educated  Patient    Methods  Explanation;Verbal cues    Comprehension  Verbalized understanding;Verbal cues required       PT Short Term Goals - 11/19/17 1253      PT SHORT TERM GOAL #1   Title  Patient will be adherent to initial HEP at least 3x a week to improve functional strength and balance for better safety at home.    Time  4    Period  Weeks    Status  New    Target Date  12/17/17        PT Long Term Goals - 11/19/17 1255      PT LONG TERM GOAL #1   Title   Patient will be independent in bending down towards floor and picking up small object (<5 pounds) and then stand back up with pain 5/10 or less to improve ability to pick  up and clean up room at home    Baseline  pain ranges from 5-10/10    Time  8    Period  Weeks    Status  New    Target Date  01/14/18      PT LONG TERM GOAL #2   Title  Patient will increase BLE gross strength to 4+/5 as to improve functional strength for independent gait, increased standing tolerance and increased ADL ability.    Time  8    Period  Weeks    Status  New    Target Date  01/14/18      PT LONG TERM GOAL #3   Title  Patient will report a worst pain of 5/10 80% of the time to improve tolerance with ADLs and reduced symptoms with activities.      Time  8    Period  Weeks    Status  New    Target Date  01/14/18      PT LONG TERM GOAL #4   Title  Patient will demonstrate an improved tolerance and ability to achieve erect standing posture to achieve patient stated goals as well as improve ability to perform functional activities.     Time  8    Period  Weeks    Status  New    Target Date  01/14/18            Plan - 12/17/17 1207    Clinical Impression Statement  PT continued therex progression with increased postural demand. Patient is able to complete all therex accurately following PT cuing. Patient reports she feels "better and more upright following session".     Rehab Potential  Fair    Clinical Impairments Affecting Rehab Potential  comorbidities, symptom duration, curvature degree    PT Frequency  2x / week    PT Duration  8 weeks    PT Treatment/Interventions  ADLs/Self Care Home Management;Aquatic Therapy;Cryotherapy;Ultrasound;Parrafin;Traction;Moist Heat;Electrical Stimulation;DME Instruction;Gait training;Stair training;Functional mobility training;Neuromuscular re-education;Balance training;Therapeutic exercise;Therapeutic activities;Patient/family education;Energy conservation;Spinal Manipulations;Joint Manipulations;Passive range of motion;Dry needling;Manual techniques    PT Next Visit Plan  FOTO; hip flexor lengthening/glute strengthening, core  control, attempt bridge    PT Home Exercise Plan  seated thoracic ext, see pt instruction section: diaphragmatic breathing in supine, supine R SB and lat stretch    Consulted and Agree with Plan of Care  Patient       Patient will benefit from skilled therapeutic intervention in order  to improve the following deficits and impairments:  Abnormal gait, Decreased balance, Decreased endurance, Decreased mobility, Difficulty walking, Hypomobility, Increased muscle spasms, Decreased range of motion, Pain, Postural dysfunction, Impaired flexibility, Increased fascial restricitons, Decreased strength, Decreased activity tolerance  Visit Diagnosis: Other idiopathic scoliosis, lumbar region     Problem List Patient Active Problem List   Diagnosis Date Noted  . Central sleep apnea 10/09/2016  . Hypertension 10/09/2016  . Lumbar postlaminectomy syndrome 10/09/2016  . Lumbar radiculopathy 10/09/2016  . Diabetic polyneuropathy ([place]) 10/07/2016  . Right lumbosacral radiculopathy 10/07/2016  . Healthcare maintenance 08/26/2016  . DKA (diabetic ketoacidoses) ([place]) 06/14/2016  . Atypical chest pain 02/23/2016  . SOB (shortness of breath) 02/23/2016  . Pain medication agreement signed 01/13/2015  . Acquired hypothyroidism 05/30/2014  . Frontal sinusitis 12/17/2013  . Atrial fibrillation ([place]) 11/29/2013  . Major depression in remission ([place]) 11/20/2013  . 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 Chronic pain syndrome 08/27/2013  . Back pain 07/17/2013  . HTN (hypertension), benign 07/17/2013  . Hyperlipidemia 07/17/2013  . Pancreatic insufficiency 07/17/2013  . Chronic postoperative pain 01/01/2013  . Long term current use of opiate analgesic 11/04/2012  . Hypothyroidism 08/25/2012  . Radiculopathy of leg 05/01/2011   Shelton Silvas PT, DPT Shelton Silvas 12/17/2017, 12:13 PM  [facility] [address], [place], [address] Phone: [phone]   Fax:  [phone]  Name: RENDI MAPEL MRN: [id number] Date of Birth: Sep 18, 1950

## 2017-12-22 ENCOUNTER — Ambulatory Visit: Payer: Medicare Other | Admitting: Physical Therapy

## 2017-12-24 ENCOUNTER — Ambulatory Visit: Payer: Medicare Other | Admitting: Physical Therapy

## 2017-12-29 ENCOUNTER — Ambulatory Visit: Payer: Medicare Other | Admitting: Physical Therapy

## 2018-01-02 ENCOUNTER — Ambulatory Visit: Payer: Medicare Other | Admitting: Physical Therapy

## 2018-02-02 ENCOUNTER — Encounter: Payer: Self-pay | Admitting: Physical Therapy

## 2018-02-02 ENCOUNTER — Ambulatory Visit: Payer: Medicare Other | Attending: Anesthesiology | Admitting: Physical Therapy

## 2018-02-02 DIAGNOSIS — M4126 Other idiopathic scoliosis, lumbar region: Secondary | ICD-10-CM

## 2018-02-02 NOTE — Therapy (Addendum)
[facility] [address] [place], [address] Phone: [phone]   Fax:  [phone]  Physical Therapy Treatment/Re-Evaluation  Patient Details  Name: Colleen Payne MRN: [id number] Date of Birth: [birth year] Referring Provider (PT): Traci Sermon MD   Encounter Date: 02/02/2018  PT End of Session - 02/02/18 1541    Visit Number  1    Number of Visits  17    Date for PT Re-Evaluation  03/30/18    PT Start Time  1115    PT Stop Time  1205    PT Time Calculation (min)  50 min    Activity Tolerance  Patient tolerated treatment well    Behavior During Therapy  [facility] for tasks assessed/performed       Past Medical History:  Diagnosis Date  . Anxiety   . Atrial fibrillation ([place])   . Depression   . Diabetes mellitus without complication (HCC)    Type I   . Headache    stress. 1x/month  . Hypertension   . Insulin pump in place   . Motion sickness    cars  . MVP (mitral valve prolapse)   . Neuromuscular disorder (HCC)    leg weakness s/p back surgeries  . Thyroid disease     Past Surgical History:  Procedure Laterality Date  . BACK SURGERY  2004   3 sugeries between Sept and Nov, fusion L2-S1  . CATARACT EXTRACTION W/PHACO Left 11/23/2014   Procedure: CATARACT EXTRACTION PHACO AND INTRAOCULAR LENS PLACEMENT (IOC);  Surgeon: Leandrew Koyanagi, MD;  Location: [facility];  Service: Ophthalmology;  Laterality: Left;  DIABETIC - insulin pump    There were no vitals filed for this visit.  Subjective Assessment - 02/02/18 1124    Subjective  Patient returns to PT following 4 week absence following non-hospitalization episode of pneumonia. Patient reports her pain is better over all but has gotten worse over the past 4 weeks, being gone from PT. Reports same pain location (lower mid back) with same pain discriptors. Patient continues to be primary caregiver for her husband, and reports she  feels as though she is getting better with transferring and changing her husband. Reports she has a nurse 3 days a week and her family is being more active in his care, which is helpful to her. Patient reports worst pain over the past week 8/10, and best pain 5/10. Patient reports she has been continuing home exercises which she thinks has helped.     Pertinent History  Patient is 68 yo female that complains of lumbar pain and inability to walk erect, as well as hip/abdominal pain. Patient with history of lumbar fusion (L2-S1). Xray results show: Fairly stable appearance of the levoscoliosis of the lower thoracic and of the lumbar spine.Stable lateral subluxation toward the left of L2 with respect L3. Stable partial compression of the body of L1. Patient complains of frustration with physicians, and that her symptoms are worsening over time. 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 Reported that her pain and difficulty worsened after trying to pick her husband up about 1 year ago. Patient mentioned that she has been told that she has "spinal fluid pockets that are leaking". PMH also includes DM I with insulin pump,  afib, HTN,  HLD, chronic pain syndrome, SOB.    Limitations  Standing;Walking;House hold activities;Other (comment)    Diagnostic tests  see exray results: Fairly  stable appearance of the levoscoliosis of the lower thoracic and lumbar spine. Stable lateral subluxation toward left of L2 with respect of L3, stable partial compression of the body of L1.    Patient Stated Goals  pain relief, standing erect    Currently in Pain?  Yes    Pain Score  7     Pain Location  Back    Pain Orientation  Left;Lower    Pain Descriptors / Indicators  Aching;Burning;Tightness    Pain Type  Chronic pain    Pain Radiating Towards  L side of back into L foot    Pain Onset  More than a month ago    Pain Frequency  Constant    Aggravating Factors   Standing erect,  transferring husband, bending over    Pain Relieving Factors  rest          OBJECTIVE  Mental Status Patient is oriented to person, place and time.  Recent memory is intact.  Remote memory is intact.  Attention span and concentration are intact.  Expressive speech is intact.  Patient's fund of knowledge is within normal limits for educational level.  SENSATION: Grossly intact to light touch bilateral L as determined by testing dermatomes L2-S2 Proprioception and hot/cold testing deferred on this date   MUSCULOSKELETAL: Tremor: None  Bulk: Normal Tone: Normal   Posture Levoscolosis of lower tspine and lumbar spine, with R shoulder hike, R rotation and lack of hip ext bilat Gait Forward and R lateral trunk lean, shuffling steps, decreased stride length, SPC in LUE    Palpation TTP above L iliac crest and along L upper lumbar and lower thoracic paraspinals   Strength (out of 5) R/L 4+/5 Hip flexion 4+/5 Hip ER 5/5 Hip IR 4+/4+ Hip abduction 3+/4+ Hip adduction 3 bilat Hip extension 4/5 Knee extension 3+/5 Knee flexion 3/5 Ankle dorsiflexion *Indicates pain   AROM (degrees) R/L (all movements include overpressure unless otherwise stated) Lumbar forward flexion (0-65): 25% limited, painful Lumbar extension (0-30): nearly 100% limited, feels "better" Lumbar lateral flexion (0-25): not limited  Lumbar rotation: 50% limited bilat All hip motions wnl bilat with EXCEPTION of bilat hip ext (10d bilat) d/t increased hip flexor tension *Indicates pain  PROM (degrees) PROM = AROM  Repeated Movements No centralization or peripheralization of symptoms with repeated lumbar extension or flexion.      Passive Accessory Intervertebral Motion (PAIVM) Pt denies reproduction of posterior hip pain with CPA L1-L5 and UPA bilaterally L1-L5. Generally hypomobile throughout  Passive Physiological Intervertebral Motion (PPIVM) Normal flexion and extension with PPIVM testing    SPECIAL TESTS Slump: Negative bilat SLR: negative  Contralateral SLR negative FABER: Positive bilat w/ concordant L sided back pain  FADIR: R: negative L: Positive bw/ concordant L sided back pain Hip scour: negative bilat 90/90 positive bilat E[place]: positive bilat Thomas: positive bilat Ober: negative bilat 5xSTS 23sec 10MWT 17.7sec  Ther-Ex - Thomas stretch x30sec hold - Review of HEP with patient of lumbar and thoracic ext in sitting with towel role, standing ip abd and ext, and hip flexion stretching. PT educated patient on the importance of completing stretching to allow for strengthening, as currently patient is unable to ext hip d//t lack of strength and ROM because of hip flexor muscle tension.  Hulan Fess PT Assessment - 02/02/18 0001      Assessment   Medical Diagnosis  Chronic back pain  Referring Provider (PT)  Traci Sermon MD    Hand Dominance  Left    Prior Therapy  Yes; here      Balance Screen   Has the patient fallen in the past 6 months  No    How many times?  no    Has the patient had a decrease in activity level because of a fear of falling?   No    Is the patient reluctant to leave their home because of a fear of falling?   No      Home Environment   Living Environment  Private residence    Living Arrangements  Spouse/significant other    Available Help at Discharge  Personal care attendant;Available PRN/intermittently    Type of [place] entrance    Home Layout  One level    [place] - single point      Prior Function   Level of Independence  Independent      Cognition   Overall Cognitive Status  Within Functional Limits for tasks assessed                           PT Education - 02/02/18 1134    Education Details   Patient was educated on diagnosis, anatomy and pathology involved, prognosis, role of PT, and was given an HEP, demonstrating exercise with  proper form following verbal and tactile cues, and was given a paper hand out to continue exercise at home. Pt was educated on and agreed to plan of care.    Person(s) Educated  Patient    Methods  Explanation;Demonstration;Tactile cues;Verbal cues;Handout    Comprehension  Verbalized understanding;Returned demonstration;Verbal cues required;Tactile cues required       PT Short Term Goals - 11/19/17 1253      PT SHORT TERM GOAL #1   Title  Patient will be adherent to initial HEP at least 3x a week to improve functional strength and balance for better safety at home.    Time  4    Period  Weeks    Status  New    Target Date  12/17/17        PT Long Term Goals - 02/02/18 1152      PT LONG TERM GOAL #1   Title   Patient will be independent in bending down towards floor and picking up small object (<5 pounds) and then stand back up with pain 5/10 or less to improve ability to pick up and clean up room at home    Baseline  1/13/120 5-6/10 pain    Time  8    Period  Weeks    Status  On-going      PT LONG TERM GOAL #2   Title  Patient will increase BLE gross strength to 4+/5 as to improve functional strength for independent gait, increased standing tolerance and increased ADL ability.    Baseline  02/02/18 see eval    Time  8    Period  Weeks    Status  On-going      PT LONG TERM GOAL #3   Title  Patient will report a worst pain of 5/10 with transfers of her husband to improve tolerance with ADLs     Baseline  02/02/18 8/10 pain with transferring husband    Time  8    Period  Weeks    Status  Revised      PT LONG TERM GOAL #4   Title  Pt will decrease 5TSTS by at least 3 seconds in order to demonstrate clinically significant improvement in LE strength    Baseline  02/02/18 23sec    Time  8    Period  Weeks    Status  New      PT LONG TERM GOAL #5   Title  Pt will increase 10MWT by at least 0.13 m/s in order to demonstrate clinically significant improvement in community  ambulation    Baseline  02/02/17 0.50m/s    Time  8    Period  Weeks    Status  New            Plan - 02/02/18 1555    Clinical Impression Statement  Pt is a 68 year old female returning to clinic following 4 week absence d/t pneumonia. Patient reports she has been completing her standing hip abd at home which reveals increased strength here through assessment. Patient continues to have impairments in hip and core strength, muscle tension inhibiting full ROM (especially in bilat hip flexors), postural and gait abnormaliities, and pain. 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 Would benefit from skilled PT to address above deficits and promote optimal return to PLOF    Rehab Potential  Fair    Clinical Impairments Affecting Rehab Potential  comorbidities, symptom duration, curvature degree    PT Frequency  2x / week    PT Duration  8 weeks    PT Treatment/Interventions  ADLs/Self Care Home Management;Aquatic Therapy;Cryotherapy;Ultrasound;Parrafin;Traction;Moist Heat;Electrical Stimulation;DME Instruction;Gait training;Stair training;Functional mobility training;Neuromuscular re-education;Balance training;Therapeutic exercise;Therapeutic activities;Patient/family education;Energy conservation;Spinal Manipulations;Joint Manipulations;Passive range of motion;Dry needling;Manual techniques    PT Next Visit Plan  FOTO; hip flexor lengthening/glute strengthening, core control, attempt bridge    PT Home Exercise Plan  seated thoracic ext, see pt instruction section: diaphragmatic breathing in supine, supine R SB and lat stretch    Consulted and Agree with Plan of Care  Patient       Patient will benefit from skilled therapeutic intervention in order to improve the following deficits and impairments:  Abnormal gait, Decreased balance, Decreased endurance, Decreased mobility, Difficulty  walking, Hypomobility, Increased muscle spasms, Decreased range of motion, Pain, Postural dysfunction, Impaired flexibility, Increased fascial restricitons, Decreased strength, Decreased activity tolerance  Visit Diagnosis: Other idiopathic scoliosis, lumbar region - Plan: PT plan of care cert/re-cert     Problem List Patient Active Problem List   Diagnosis Date Noted  . Central sleep apnea 10/09/2016  . Hypertension 10/09/2016  . Lumbar postlaminectomy syndrome 10/09/2016  . Lumbar radiculopathy 10/09/2016  . Diabetic polyneuropathy ([place]) 10/07/2016  . Right lumbosacral radiculopathy 10/07/2016  . Healthcare maintenance 08/26/2016  . DKA (diabetic ketoacidoses) ([place]) 06/14/2016  . Atypical chest pain 02/23/2016  . SOB (shortness of breath) 02/23/2016  . Pain medication agreement signed 01/13/2015  . Acquired hypothyroidism 05/30/2014  . Frontal sinusitis 12/17/2013  . Atrial fibrillation ([place]) 11/29/2013  . Major depression in remission ([place]) 11/20/2013  . 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 Chronic pain syndrome 08/27/2013  . Back pain 07/17/2013  . HTN (hypertension), benign 07/17/2013  . Hyperlipidemia 07/17/2013  . Pancreatic insufficiency 07/17/2013  . Chronic postoperative pain 01/01/2013  . Long term current use of opiate analgesic 11/04/2012  . Hypothyroidism 08/25/2012  . Radiculopathy of  leg 05/01/2011   Shelton Silvas PT, DPT Shelton Silvas 02/02/2018, 4:02 PM  [facility] [address] [place], [address] Phone: [phone]   Fax:  [phone]  Name: Colleen Payne MRN: [id number] Date of Birth: 1950/10/13

## 2018-02-04 ENCOUNTER — Encounter: Payer: Self-pay | Admitting: Physical Therapy

## 2018-02-04 ENCOUNTER — Ambulatory Visit: Payer: Medicare Other | Admitting: Physical Therapy

## 2018-02-04 DIAGNOSIS — M4126 Other idiopathic scoliosis, lumbar region: Secondary | ICD-10-CM | POA: Diagnosis not present

## 2018-02-04 NOTE — Therapy (Signed)
[facility] [address] [place], [address] Phone: [phone]   Fax:  [phone]  Physical Therapy Treatment  Patient Details  Name: Colleen Payne MRN: [id number] Date of Birth: 1950-12-12 Referring Provider (PT): Traci Sermon MD   Encounter Date: 02/04/2018  PT End of Session - 02/04/18 1126    Visit Number  2    Number of Visits  17    Date for PT Re-Evaluation  03/30/18    Authorization Time Period  2/10    PT Start Time  1115    PT Stop Time  1200    PT Time Calculation (min)  45 min    Activity Tolerance  Patient tolerated treatment well    Behavior During Therapy  [facility] for tasks assessed/performed       Past Medical History:  Diagnosis Date  . Anxiety   . Atrial fibrillation ([place])   . Depression   . Diabetes mellitus without complication (HCC)    Type I   . Headache    stress. 1x/month  . Hypertension   . Insulin pump in place   . Motion sickness    cars  . MVP (mitral valve prolapse)   . Neuromuscular disorder (HCC)    leg weakness s/p back surgeries  . Thyroid disease     Past Surgical History:  Procedure Laterality Date  . BACK SURGERY  2004   3 sugeries between Sept and Nov, fusion L2-S1  . CATARACT EXTRACTION W/PHACO Left 11/23/2014   Procedure: CATARACT EXTRACTION PHACO AND INTRAOCULAR LENS PLACEMENT (IOC);  Surgeon: Leandrew Koyanagi, MD;  Location: [facility];  Service: Ophthalmology;  Laterality: Left;  DIABETIC - insulin pump    There were no vitals filed for this visit.  Subjective Assessment - 02/04/18 1121    Subjective  Patient reports 8/10 pain this am, and reports she feels "very stiff this am". Patient reports compliance with HEP     Pertinent History  Patient is 68 yo female that complains of lumbar pain and inability to walk erect, as well as hip/abdominal pain. Patient with history of lumbar fusion (L2-S1). Xray results show: Fairly stable  appearance of the levoscoliosis of the lower thoracic and of the lumbar spine.Stable lateral subluxation toward the left of L2 with respect L3. Stable partial compression of the body of L1. Patient complains of frustration with physicians, and that her symptoms are worsening over time. 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 Reported that her pain and difficulty worsened after trying to pick her husband up about 1 year ago. Patient mentioned that she has been told that she has "spinal fluid pockets that are leaking". PMH also includes DM I with insulin pump,  afib, HTN,  HLD, chronic pain syndrome, SOB.    Limitations  Standing;Walking;House hold activities;Other (comment)    Diagnostic tests  see exray results: Fairly stable appearance of the levoscoliosis of the lower thoracic and lumbar spine. Stable lateral subluxation toward left of L2 with respect of L3, stable partial compression of the body of L1.    Patient Stated Goals  pain relief, standing erect    Pain Onset  More than a month ago    Pain Onset  More than a month ago          Ther-Ex -Modified thomas stretch 49min; with PT overpressure 7min; bilat  - Prone on elbows prop with cuing to maintain  ASIS contact with mat table as much as possible 67min  - Standing hip ext with elevated table with bolster to support upper body to encourage neutral standing and therapist tactile stabilization at hips 3x 10 with demo and multiple VC for proper form with good carry over with PT assistance. Seated break after 2 sets - Stepping backwards over 89ft x3 with bilat HHA with patient pushing into therapist hands to promote "standing up tall"; Demo and cuing for push off with hip ext active  - Standingthoracicextw/ scap retractionfrom tablewith UE crossed 3x10; tactile stabilization at post hip, with PT giving overpressure into extat the maxrangeof reps; muscle fatigue at last 2-3 reps requiring increased  PT assistance                       PT Education - 02/04/18 1125    Education Details  Exercise form    Person(s) Educated  Patient    Methods  Explanation;Demonstration;Tactile cues;Verbal cues    Comprehension  Verbalized understanding;Returned demonstration;Verbal cues required;Tactile cues required       PT Short Term Goals - 11/19/17 1253      PT SHORT TERM GOAL #1   Title  Patient will be adherent to initial HEP at least 3x a week to improve functional strength and balance for better safety at home.    Time  4    Period  Weeks    Status  New    Target Date  12/17/17        PT Long Term Goals - 02/02/18 1152      PT LONG TERM GOAL #1   Title   Patient will be independent in bending down towards floor and picking up small object (<5 pounds) and then stand back up with pain 5/10 or less to improve ability to pick up and clean up room at home    Baseline  1/13/120 5-6/10 pain    Time  8    Period  Weeks    Status  On-going      PT LONG TERM GOAL #2   Title  Patient will increase BLE gross strength to 4+/5 as to improve functional strength for independent gait, increased standing tolerance and increased ADL ability.    Baseline  02/02/18 see eval    Time  8    Period  Weeks    Status  On-going      PT LONG TERM GOAL #3   Title  Patient will report a worst pain of 5/10 with transfers of her husband to improve tolerance with ADLs     Baseline  02/02/18 8/10 pain with transferring husband    Time  8    Period  Weeks    Status  Revised      PT LONG TERM GOAL #4   Title  Pt will decrease 5TSTS by at least 3 seconds in order to demonstrate clinically significant improvement in LE strength    Baseline  02/02/18 23sec    Time  8    Period  Weeks    Status  New      PT LONG TERM GOAL #5   Title  Pt will increase 10MWT by at least 0.13 m/s in order to demonstrate clinically significant improvement in community ambulation    Baseline  02/02/17 0.1m/s     Time  8    Period  Weeks    Status  New  Plan - 02/04/18 1224    Clinical Impression Statement  PT led patient through therex for hip/lumbar/thoracic extensor activation with postural correction. Patient is able to comoplete all therex with accuracy following PT cuing. Patient reports decreased pain following session with noted posture closer to neutral. Pt will continue to benefit from skilled PT to address impairments to return to PLOF and increase QOL    Rehab Potential  Fair    Clinical Impairments Affecting Rehab Potential  comorbidities, symptom duration, curvature degree    PT Frequency  2x / week    PT Duration  8 weeks    PT Treatment/Interventions  ADLs/Self Care Home Management;Aquatic Therapy;Cryotherapy;Ultrasound;Parrafin;Traction;Moist Heat;Electrical Stimulation;DME Instruction;Gait training;Stair training;Functional mobility training;Neuromuscular re-education;Balance training;Therapeutic exercise;Therapeutic activities;Patient/family education;Energy conservation;Spinal Manipulations;Joint Manipulations;Passive range of motion;Dry needling;Manual techniques    PT Next Visit Plan  FOTO; hip flexor lengthening/glute strengthening, core control, attempt bridge    PT Home Exercise Plan  seated thoracic ext, see pt instruction section: diaphragmatic breathing in supine, supine R SB and lat stretch    Consulted and Agree with Plan of Care  Patient       Patient will benefit from skilled therapeutic intervention in order to improve the following deficits and impairments:  Abnormal gait, Decreased balance, Decreased endurance, Decreased mobility, Difficulty walking, Hypomobility, Increased muscle spasms, Decreased range of motion, Pain, Postural dysfunction, Impaired flexibility, Increased fascial restricitons, Decreased strength, Decreased activity tolerance  Visit Diagnosis: Other idiopathic scoliosis, lumbar region     Problem List Patient Active Problem List    Diagnosis Date Noted  . Central sleep apnea 10/09/2016  . Hypertension 10/09/2016  . Lumbar postlaminectomy syndrome 10/09/2016  . Lumbar radiculopathy 10/09/2016  . Diabetic polyneuropathy ([place]) 10/07/2016  . Right lumbosacral radiculopathy 10/07/2016  . Healthcare maintenance 08/26/2016  . DKA (diabetic ketoacidoses) ([place]) 06/14/2016  . Atypical chest pain 02/23/2016  . SOB (shortness of breath) 02/23/2016  . Pain medication agreement signed 01/13/2015  . Acquired hypothyroidism 05/30/2014  . Frontal sinusitis 12/17/2013  . Atrial fibrillation ([place]) 11/29/2013  . Major depression in remission ([place]) 11/20/2013  . 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 Chronic pain syndrome 08/27/2013  . Back pain 07/17/2013  . HTN (hypertension), benign 07/17/2013  . Hyperlipidemia 07/17/2013  . Pancreatic insufficiency 07/17/2013  . Chronic postoperative pain 01/01/2013  . Long term current use of opiate analgesic 11/04/2012  . Hypothyroidism 08/25/2012  . Radiculopathy of leg 05/01/2011   Shelton Silvas PT, DPT Shelton Silvas 02/04/2018, 12:31 PM  [facility] [address], [place], [address] Phone: [phone]   Fax:  [phone]  Name: Colleen Payne MRN: [id number] Date of Birth: 11/10/1950

## 2018-02-11 ENCOUNTER — Ambulatory Visit: Payer: Medicare Other | Admitting: Physical Therapy

## 2018-02-11 ENCOUNTER — Encounter: Payer: Self-pay | Admitting: Physical Therapy

## 2018-02-11 DIAGNOSIS — M4126 Other idiopathic scoliosis, lumbar region: Secondary | ICD-10-CM | POA: Diagnosis not present

## 2018-02-11 NOTE — Therapy (Signed)
[facility] [address], [place], [address] Phone: [phone]   Fax:  [phone]  Physical Therapy Treatment  Patient Details  Name: Colleen Payne MRN: [id number] Date of Birth: 09-06-1950 Referring Provider (PT): Traci Sermon MD   Encounter Date: 02/11/2018  PT End of Session - 02/11/18 1159    Visit Number  3    Number of Visits  17    Date for PT Re-Evaluation  03/30/18    Authorization Type  Medicare    Authorization Time Period  3/10    PT Start Time  1124    PT Stop Time  1204    PT Time Calculation (min)  40 min    Activity Tolerance  Patient tolerated treatment well    Behavior During Therapy  [facility] for tasks assessed/performed       Past Medical History:  Diagnosis Date  . Anxiety   . Atrial fibrillation ([place])   . Depression   . Diabetes mellitus without complication (HCC)    Type I   . Headache    stress. 1x/month  . Hypertension   . Insulin pump in place   . Motion sickness    cars  . MVP (mitral valve prolapse)   . Neuromuscular disorder (HCC)    leg weakness s/p back surgeries  . Thyroid disease     Past Surgical History:  Procedure Laterality Date  . BACK SURGERY  2004   3 sugeries between Sept and Nov, fusion L2-S1  . CATARACT EXTRACTION W/PHACO Left 11/23/2014   Procedure: CATARACT EXTRACTION PHACO AND INTRAOCULAR LENS PLACEMENT (IOC);  Surgeon: Leandrew Koyanagi, MD;  Location: [facility];  Service: Ophthalmology;  Laterality: Left;  DIABETIC - insulin pump    There were no vitals filed for this visit.  Subjective Assessment - 02/11/18 1131    Subjective  Pt reports 8/10 on NPS. Pt reports she has felt about "the same" since last session. HEP has not been "going as good as I'd like them to". Pt reports her hips feel "tight" Pt reports having a doctor visit this morning for her diabetes. Pt reports performing backwards walking with RW at home.     Limitations  Standing;Walking;House hold activities;Other (comment)    Currently in Pain?  Yes    Pain Score  8     Pain Location  Back    Pain Orientation  Left;Lower    Pain Descriptors / Indicators  Aching;Burning;Tightness    Pain Type  Chronic pain    Pain Frequency  Constant    Multiple Pain Sites  Yes        Ther-Ex -Modified thomas stretch 68min; with PT overpressure 92min; bilat  - Seated rows with GTB with rolled towel behind tspine to promote neutral posture 3x 10 with TC initially for proper posture with good carry over following - Prone lying with pillows > without pillows > with elbow prop 53min with TC at hips for stabilization with VC and TC for deep breathing with cues to "sink hips" on exhale, patient able to comply with cuing well - Stepping backwards over 24ft x3with bilat HHA with patient pushing into therapist hands to promote thoracic ext as well, with cuing to "keep head up" to help with this - Standingthoracicextw/ scap retraction from tablewith UE crossed 3x10; tactile stabilizationat post hip, with PT giving overpressure into extat the maxrangeof reps, with no excess assistance needed for last 2-3 reps this session  PT Education - 02/11/18 1159    Education Details  Exercise technique/form    Person(s) Educated  Patient    Methods  Explanation;Demonstration;Tactile cues;Verbal cues    Comprehension  Verbalized understanding;Returned demonstration;Verbal cues required;Tactile cues required       PT Short Term Goals - 11/19/17 1253      PT SHORT TERM GOAL #1   Title  Patient will be adherent to initial HEP at least 3x a week to improve functional strength and balance for better safety at home.    Time  4    Period  Weeks    Status  New    Target Date  12/17/17        PT Long Term Goals - 02/02/18 1152      PT LONG TERM GOAL #1   Title   Patient will be independent in bending down towards floor  and picking up small object (<5 pounds) and then stand back up with pain 5/10 or less to improve ability to pick up and clean up room at home    Baseline  1/13/120 5-6/10 pain    Time  8    Period  Weeks    Status  On-going      PT LONG TERM GOAL #2   Title  Patient will increase BLE gross strength to 4+/5 as to improve functional strength for independent gait, increased standing tolerance and increased ADL ability.    Baseline  02/02/18 see eval    Time  8    Period  Weeks    Status  On-going      PT LONG TERM GOAL #3   Title  Patient will report a worst pain of 5/10 with transfers of her husband to improve tolerance with ADLs     Baseline  02/02/18 8/10 pain with transferring husband    Time  8    Period  Weeks    Status  Revised      PT LONG TERM GOAL #4   Title  Pt will decrease 5TSTS by at least 3 seconds in order to demonstrate clinically significant improvement in LE strength    Baseline  02/02/18 23sec    Time  8    Period  Weeks    Status  New      PT LONG TERM GOAL #5   Title  Pt will increase 10MWT by at least 0.13 m/s in order to demonstrate clinically significant improvement in community ambulation    Baseline  02/02/17 0.77m/s    Time  8    Period  Weeks    Status  New            Plan - 02/11/18 1207    Clinical Impression Statement  Pt performed therex to improve hip flexor flexibilty and hip, lumbar, and thoracic extension to improve posture. Pt completed all therex with cueing with exercise. After therex pt presented with improved posture closer to a neutral position. Pt can continue to benefit from skilled treatment in order to decrease pain, and improve posture to return to PLOF and to improve QOL.    Clinical Presentation  Evolving    Rehab Potential  Fair    Clinical Impairments Affecting Rehab Potential  comorbidities, symptom duration, curvature degree    PT Frequency  2x / week    PT Duration  8 weeks    PT Treatment/Interventions  ADLs/Self Care  Home Management;Aquatic Therapy;Cryotherapy;Ultrasound;Parrafin;Traction;Moist Heat;Electrical Stimulation;DME Instruction;Gait training;Stair training;Functional mobility training;Neuromuscular re-education;Balance training;Therapeutic exercise;Therapeutic  activities;Patient/family education;Energy conservation;Spinal Manipulations;Joint Manipulations;Passive range of motion;Dry needling;Manual techniques    PT Next Visit Plan  FOTO; hip flexor lengthening/glute strengthening, core control, attempt bridge    PT Home Exercise Plan  seated thoracic ext, see pt instruction section: diaphragmatic breathing in supine, supine R SB and lat stretch    Consulted and Agree with Plan of Care  Patient       Patient will benefit from skilled therapeutic intervention in order to improve the following deficits and impairments:  Abnormal gait, Decreased balance, Decreased endurance, Decreased mobility, Difficulty walking, Hypomobility, Increased muscle spasms, Decreased range of motion, Pain, Postural dysfunction, Impaired flexibility, Increased fascial restricitons, Decreased strength, Decreased activity tolerance  Visit Diagnosis: Other idiopathic scoliosis, lumbar region     Problem List Patient Active Problem List   Diagnosis Date Noted  . Central sleep apnea 10/09/2016  . Hypertension 10/09/2016  . Lumbar postlaminectomy syndrome 10/09/2016  . Lumbar radiculopathy 10/09/2016  . Diabetic polyneuropathy ([place]) 10/07/2016  . Right lumbosacral radiculopathy 10/07/2016  . Healthcare maintenance 08/26/2016  . DKA (diabetic ketoacidoses) ([place]) 06/14/2016  . Atypical chest pain 02/23/2016  . SOB (shortness of breath) 02/23/2016  . Pain medication agreement signed 01/13/2015  . Acquired hypothyroidism 05/30/2014  . Frontal sinusitis 12/17/2013  . Atrial fibrillation ([place]) 11/29/2013  . Major depression in remission ([place]) 11/20/2013  . 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)  10/26/2013  . Chronic pain syndrome 08/27/2013  . Back pain 07/17/2013  . HTN (hypertension), benign 07/17/2013  . Hyperlipidemia 07/17/2013  . Pancreatic insufficiency 07/17/2013  . Chronic postoperative pain 01/01/2013  . Long term current use of opiate analgesic 11/04/2012  . Hypothyroidism 08/25/2012  . Radiculopathy of leg 05/01/2011   Shelton Silvas PT, DPT Shelton Silvas, SPT 02/11/2018, 1:34 PM  [facility] [address], [place], [address] Phone: [phone]   Fax:  [phone]  Name: JISEL FLEET MRN: [id number] Date of Birth: 08-28-1950

## 2018-02-18 ENCOUNTER — Ambulatory Visit: Payer: Medicare Other | Admitting: Physical Therapy

## 2018-02-18 ENCOUNTER — Encounter: Payer: Self-pay | Admitting: Physical Therapy

## 2018-02-18 DIAGNOSIS — M4126 Other idiopathic scoliosis, lumbar region: Secondary | ICD-10-CM

## 2018-02-18 NOTE — Therapy (Signed)
[facility] [address] [place], [address] Phone: [phone]   Fax:  [phone]  Physical Therapy Treatment  Patient Details  Name: Colleen Payne MRN: [id number] Date of Birth: [birth year] Referring Provider (PT): Traci Sermon MD   Encounter Date: 02/18/2018  PT End of Session - 02/18/18 0929    Visit Number  4    Number of Visits  17    Date for PT Re-Evaluation  03/30/18    Authorization Type  Medicare    Authorization Time Period  4/10    PT Start Time  0905    PT Stop Time  0945    PT Time Calculation (min)  40 min    Activity Tolerance  Patient tolerated treatment well    Behavior During Therapy  [facility] for tasks assessed/performed       Past Medical History:  Diagnosis Date  . Anxiety   . Atrial fibrillation ([place])   . Depression   . Diabetes mellitus without complication (HCC)    Type I   . Headache    stress. 1x/month  . Hypertension   . Insulin pump in place   . Motion sickness    cars  . MVP (mitral valve prolapse)   . Neuromuscular disorder (HCC)    leg weakness s/p back surgeries  . Thyroid disease     Past Surgical History:  Procedure Laterality Date  . BACK SURGERY  2004   3 sugeries between Sept and Nov, fusion L2-S1  . CATARACT EXTRACTION W/PHACO Left 11/23/2014   Procedure: CATARACT EXTRACTION PHACO AND INTRAOCULAR LENS PLACEMENT (IOC);  Surgeon: Leandrew Koyanagi, MD;  Location: [facility];  Service: Ophthalmology;  Laterality: Left;  DIABETIC - insulin pump    There were no vitals filed for this visit.  Subjective Assessment - 02/18/18 0911    Subjective  Pt reports 8/10 pain on NPS. Patient reports compliance with HEP "slowly". Patient reports she is trying to stand up straighter with ADLs but this is difficult.     Pertinent History  Patient is 68 yo female that complains of lumbar pain and inability to walk erect, as well as hip/abdominal pain.  Patient with history of lumbar fusion (L2-S1). Xray results show: Fairly stable appearance of the levoscoliosis of the lower thoracic and of the lumbar spine.Stable lateral subluxation toward the left of L2 with respect L3. Stable partial compression of the body of L1. Patient complains of frustration with physicians, and that her symptoms are worsening over time. 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 Reported that her pain and difficulty worsened after trying to pick her husband up about 1 year ago. Patient mentioned that she has been told that she has "spinal fluid pockets that are leaking". PMH also includes DM I with insulin pump,  afib, HTN,  HLD, chronic pain syndrome, SOB.    Limitations  Standing;Walking;House hold activities;Other (comment)    Diagnostic tests  see exray results: Fairly stable appearance of the levoscoliosis of the lower thoracic and lumbar spine. Stable lateral subluxation toward left of L2 with respect of L3, stable partial compression of the body of L1.    Patient Stated Goals  pain relief, standing erect    Pain Onset  More than a month ago    Pain Onset  More than a month ago           Ther-Ex -Modified thomas stretch  31min; with PT overpressure 74min; bilat  - Prone laying 62min; prone prop with PT overpressure at hips to maintain contact with mat table 66min - Prone press up 2x 10 with cuing to activate posterior ext and utilize UE for assistance, not as main movers - Prone T 3x 10 with cuing for scapular contraction with noted fatigue between sets - Supine bridge 3x 6 50% ROM with TC for glute contraction with good carry over following  - Standing hip ext 3x 10 b/l with UE supported at hands (previously forearms) with min TC and VC to maintain erect posture, with cuing to focus on form of therex rather than quantity of reps                     PT Education - 02/18/18 0926    Education Details   Exercise form    Person(s) Educated  Patient    Methods  Explanation;Demonstration;Tactile cues;Verbal cues    Comprehension  Verbalized understanding;Returned demonstration;Verbal cues required;Tactile cues required       PT Short Term Goals - 11/19/17 1253      PT SHORT TERM GOAL #1   Title  Patient will be adherent to initial HEP at least 3x a week to improve functional strength and balance for better safety at home.    Time  4    Period  Weeks    Status  New    Target Date  12/17/17        PT Long Term Goals - 02/02/18 1152      PT LONG TERM GOAL #1   Title   Patient will be independent in bending down towards floor and picking up small object (<5 pounds) and then stand back up with pain 5/10 or less to improve ability to pick up and clean up room at home    Baseline  1/13/120 5-6/10 pain    Time  8    Period  Weeks    Status  On-going      PT LONG TERM GOAL #2   Title  Patient will increase BLE gross strength to 4+/5 as to improve functional strength for independent gait, increased standing tolerance and increased ADL ability.    Baseline  02/02/18 see eval    Time  8    Period  Weeks    Status  On-going      PT LONG TERM GOAL #3   Title  Patient will report a worst pain of 5/10 with transfers of her husband to improve tolerance with ADLs     Baseline  02/02/18 8/10 pain with transferring husband    Time  8    Period  Weeks    Status  Revised      PT LONG TERM GOAL #4   Title  Pt will decrease 5TSTS by at least 3 seconds in order to demonstrate clinically significant improvement in LE strength    Baseline  02/02/18 23sec    Time  8    Period  Weeks    Status  New      PT LONG TERM GOAL #5   Title  Pt will increase 10MWT by at least 0.13 m/s in order to demonstrate clinically significant improvement in community ambulation    Baseline  02/02/17 0.75m/s    Time  8    Period  Weeks    Status  New            Plan - 02/18/18 1332  Clinical Impression  Statement  PT continued therex progression for ant hip stretching, and hip/spine ext strengthening. Patient is able to tolerate increased time in prone this session to promote hip stretch with therex strengthening. Patient is improving glute strength as evidenced by ability to perform bridge exercise to 50% (previously unable). PT will continue progression as able.     Rehab Potential  Fair    Clinical Impairments Affecting Rehab Potential  comorbidities, symptom duration, curvature degree    PT Frequency  2x / week    PT Duration  8 weeks    PT Treatment/Interventions  ADLs/Self Care Home Management;Aquatic Therapy;Cryotherapy;Ultrasound;Parrafin;Traction;Moist Heat;Electrical Stimulation;DME Instruction;Gait training;Stair training;Functional mobility training;Neuromuscular re-education;Balance training;Therapeutic exercise;Therapeutic activities;Patient/family education;Energy conservation;Spinal Manipulations;Joint Manipulations;Passive range of motion;Dry needling;Manual techniques    PT Next Visit Plan   hip flexor lengthening/glute strengthening, core control, attempt bridge    PT Home Exercise Plan  seated thoracic ext, see pt instruction section: diaphragmatic breathing in supine, supine R SB and lat stretch    Consulted and Agree with Plan of Care  Patient       Patient will benefit from skilled therapeutic intervention in order to improve the following deficits and impairments:  Abnormal gait, Decreased balance, Decreased endurance, Decreased mobility, Difficulty walking, Hypomobility, Increased muscle spasms, Decreased range of motion, Pain, Postural dysfunction, Impaired flexibility, Increased fascial restricitons, Decreased strength, Decreased activity tolerance  Visit Diagnosis: Other idiopathic scoliosis, lumbar region     Problem List Patient Active Problem List   Diagnosis Date Noted  . Central sleep apnea 10/09/2016  . Hypertension 10/09/2016  . Lumbar postlaminectomy  syndrome 10/09/2016  . Lumbar radiculopathy 10/09/2016  . Diabetic polyneuropathy ([place]) 10/07/2016  . Right lumbosacral radiculopathy 10/07/2016  . Healthcare maintenance 08/26/2016  . DKA (diabetic ketoacidoses) ([place]) 06/14/2016  . Atypical chest pain 02/23/2016  . SOB (shortness of breath) 02/23/2016  . Pain medication agreement signed 01/13/2015  . Acquired hypothyroidism 05/30/2014  . Frontal sinusitis 12/17/2013  . Atrial fibrillation ([place]) 11/29/2013  . Major depression in remission ([place]) 11/20/2013  . 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 Chronic pain syndrome 08/27/2013  . Back pain 07/17/2013  . HTN (hypertension), benign 07/17/2013  . Hyperlipidemia 07/17/2013  . Pancreatic insufficiency 07/17/2013  . Chronic postoperative pain 01/01/2013  . Long term current use of opiate analgesic 11/04/2012  . Hypothyroidism 08/25/2012  . Radiculopathy of leg 05/01/2011   Shelton Silvas PT, DPT Shelton Silvas 02/18/2018, 1:39 PM   [facility] [address], [place], [address] Phone: [phone]   Fax:  [phone]  Name: Colleen Payne MRN: [id number] Date of Birth: [birth year]

## 2018-02-20 ENCOUNTER — Ambulatory Visit: Payer: Medicare Other | Admitting: Physical Therapy

## 2018-02-20 ENCOUNTER — Encounter: Payer: Self-pay | Admitting: Physical Therapy

## 2018-02-20 DIAGNOSIS — M4126 Other idiopathic scoliosis, lumbar region: Secondary | ICD-10-CM

## 2018-02-20 NOTE — Therapy (Signed)
[facility] [address] [place], [address] Phone: [phone]   Fax:  [phone]  Physical Therapy Treatment  Patient Details  Name: Colleen Payne MRN: [id number] Date of Birth: 08/18/1950 Referring Provider (PT): Traci Sermon MD   Encounter Date: 02/20/2018  PT End of Session - 02/20/18 0928    Visit Number  5    Number of Visits  17    Date for PT Re-Evaluation  03/30/18    Authorization Type  Medicare    Authorization Time Period  5/10    PT Start Time  0922    PT Stop Time  1000    PT Time Calculation (min)  38 min    Activity Tolerance  Patient tolerated treatment well    Behavior During Therapy  [facility] for tasks assessed/performed       Past Medical History:  Diagnosis Date  . Anxiety   . Atrial fibrillation ([place])   . Depression   . Diabetes mellitus without complication (HCC)    Type I   . Headache    stress. 1x/month  . Hypertension   . Insulin pump in place   . Motion sickness    cars  . MVP (mitral valve prolapse)   . Neuromuscular disorder (HCC)    leg weakness s/p back surgeries  . Thyroid disease     Past Surgical History:  Procedure Laterality Date  . BACK SURGERY  2004   3 sugeries between Sept and Nov, fusion L2-S1  . CATARACT EXTRACTION W/PHACO Left 11/23/2014   Procedure: CATARACT EXTRACTION PHACO AND INTRAOCULAR LENS PLACEMENT (IOC);  Surgeon: Leandrew Koyanagi, MD;  Location: [facility];  Service: Ophthalmology;  Laterality: Left;  DIABETIC - insulin pump    There were no vitals filed for this visit.  Subjective Assessment - 02/20/18 0924    Subjective  Patient reports 9/10 pain this am. Patient reports soreness yesterday following PT session Wednesday. Patient reports she is "okay with pain" but reprots she was so sore she could not hold herself up yesterday.     Pertinent History  Patient is 68 yo female that complains of lumbar pain and  inability to walk erect, as well as hip/abdominal pain. Patient with history of lumbar fusion (L2-S1). Xray results show: Fairly stable appearance of the levoscoliosis of the lower thoracic and of the lumbar spine.Stable lateral subluxation toward the left of L2 with respect L3. Stable partial compression of the body of L1. Patient complains of frustration with physicians, and that her symptoms are worsening over time. 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 Reported that her pain and difficulty worsened after trying to pick her husband up about 1 year ago. Patient mentioned that she has been told that she has "spinal fluid pockets that are leaking". PMH also includes DM I with insulin pump,  afib, HTN,  HLD, chronic pain syndrome, SOB.    Limitations  Standing;Walking;House hold activities;Other (comment)    Diagnostic tests  see exray results: Fairly stable appearance of the levoscoliosis of the lower thoracic and lumbar spine. Stable lateral subluxation toward left of L2 with respect of L3, stable partial compression of the body of L1.    Patient Stated Goals  pain relief, standing erect    Pain Onset  More than a month ago    Pain Onset  More than a month ago       Ther-Ex -  Modified thomas stretch 33min; with PT overpressure 72min; bilat - Prone prop 65min; with PT overpressure at hips 19mins - Prone press up from elbows 2x 10 with cuing to activate posterior ext and utilize UE for assistance, not as main movers - Double knee to chest x20 with slight rock side to side following increased pain with prone lying - Supine bridge 3x 10/8/6 50% ROM with TC for glute contraction with good carry over following  - Sidelying hip abd 3x 10 each side with TC for set up with good carry over following    Manual - STM with trigger point release to bilat lower tspine paraspinals with increased tension/pain at L>R with patient reporting concordat sign with decreased  pain following manual techniques  Heat pack at end of session unbilled 51mins                   PT Education - 02/20/18 0927    Education Details  Exercise form; pain management    Person(s) Educated  Patient    Methods  Explanation;Verbal cues    Comprehension  Verbalized understanding;Verbal cues required       PT Short Term Goals - 11/19/17 1253      PT SHORT TERM GOAL #1   Title  Patient will be adherent to initial HEP at least 3x a week to improve functional strength and balance for better safety at home.    Time  4    Period  Weeks    Status  New    Target Date  12/17/17        PT Long Term Goals - 02/02/18 1152      PT LONG TERM GOAL #1   Title   Patient will be independent in bending down towards floor and picking up small object (<5 pounds) and then stand back up with pain 5/10 or less to improve ability to pick up and clean up room at home    Baseline  1/13/120 5-6/10 pain    Time  8    Period  Weeks    Status  On-going      PT LONG TERM GOAL #2   Title  Patient will increase BLE gross strength to 4+/5 as to improve functional strength for independent gait, increased standing tolerance and increased ADL ability.    Baseline  02/02/18 see eval    Time  8    Period  Weeks    Status  On-going      PT LONG TERM GOAL #3   Title  Patient will report a worst pain of 5/10 with transfers of her husband to improve tolerance with ADLs     Baseline  02/02/18 8/10 pain with transferring husband    Time  8    Period  Weeks    Status  Revised      PT LONG TERM GOAL #4   Title  Pt will decrease 5TSTS by at least 3 seconds in order to demonstrate clinically significant improvement in LE strength    Baseline  02/02/18 23sec    Time  8    Period  Weeks    Status  New      PT LONG TERM GOAL #5   Title  Pt will increase 10MWT by at least 0.13 m/s in order to demonstrate clinically significant improvement in community ambulation    Baseline  02/02/17 0.56m/s     Time  8    Period  Weeks  Status  New            Plan - 02/20/18 4944    Clinical Impression Statement  PT decreased intensity this session d/t increased pain/soreness noted from patient following last session. Utilized manual techniques to decrease pain (patient reports 7/10 pain following). PT encouraged patient to continue HEP at home to maintain strength gains, with focus on erect walking and standing; patient verbalized understanding.     Rehab Potential  Fair    Clinical Impairments Affecting Rehab Potential  comorbidities, symptom duration, curvature degree    PT Frequency  2x / week    PT Duration  8 weeks    PT Treatment/Interventions  ADLs/Self Care Home Management;Aquatic Therapy;Cryotherapy;Ultrasound;Parrafin;Traction;Moist Heat;Electrical Stimulation;DME Instruction;Gait training;Stair training;Functional mobility training;Neuromuscular re-education;Balance training;Therapeutic exercise;Therapeutic activities;Patient/family education;Energy conservation;Spinal Manipulations;Joint Manipulations;Passive range of motion;Dry needling;Manual techniques    PT Next Visit Plan   hip flexor lengthening/glute strengthening, core control, attempt bridge    PT Home Exercise Plan  seated thoracic ext, see pt instruction section: diaphragmatic breathing in supine, supine R SB and lat stretch    Consulted and Agree with Plan of Care  Patient       Patient will benefit from skilled therapeutic intervention in order to improve the following deficits and impairments:  Abnormal gait, Decreased balance, Decreased endurance, Decreased mobility, Difficulty walking, Hypomobility, Increased muscle spasms, Decreased range of motion, Pain, Postural dysfunction, Impaired flexibility, Increased fascial restricitons, Decreased strength, Decreased activity tolerance  Visit Diagnosis: Other idiopathic scoliosis, lumbar region     Problem List Patient Active Problem List   Diagnosis Date Noted   . Central sleep apnea 10/09/2016  . Hypertension 10/09/2016  . Lumbar postlaminectomy syndrome 10/09/2016  . Lumbar radiculopathy 10/09/2016  . Diabetic polyneuropathy ([place]) 10/07/2016  . Right lumbosacral radiculopathy 10/07/2016  . Healthcare maintenance 08/26/2016  . DKA (diabetic ketoacidoses) ([place]) 06/14/2016  . Atypical chest pain 02/23/2016  . SOB (shortness of breath) 02/23/2016  . Pain medication agreement signed 01/13/2015  . Acquired hypothyroidism 05/30/2014  . Frontal sinusitis 12/17/2013  . Atrial fibrillation ([place]) 11/29/2013  . Major depression in remission ([place]) 11/20/2013  . 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 Chronic pain syndrome 08/27/2013  . Back pain 07/17/2013  . HTN (hypertension), benign 07/17/2013  . Hyperlipidemia 07/17/2013  . Pancreatic insufficiency 07/17/2013  . Chronic postoperative pain 01/01/2013  . Long term current use of opiate analgesic 11/04/2012  . Hypothyroidism 08/25/2012  . Radiculopathy of leg 05/01/2011   Shelton Silvas PT, DPT Shelton Silvas 02/20/2018, 10:00 AM  [facility] [address] [place], [address] Phone: [phone]   Fax:  [phone]  Name: Colleen Payne MRN: [id number] Date of Birth: 1950-11-09

## 2018-02-25 ENCOUNTER — Ambulatory Visit: Payer: Medicare Other | Attending: Anesthesiology | Admitting: Physical Therapy

## 2018-02-25 ENCOUNTER — Encounter: Payer: Self-pay | Admitting: Physical Therapy

## 2018-02-25 DIAGNOSIS — M4126 Other idiopathic scoliosis, lumbar region: Secondary | ICD-10-CM | POA: Insufficient documentation

## 2018-02-25 NOTE — Therapy (Signed)
[facility] [address], [place], [address] Phone: [phone]   Fax:  [phone]  Physical Therapy Treatment  Patient Details  Name: Colleen Payne MRN: [id number] Date of Birth: 12-08-1950 Referring Provider (PT): Traci Sermon MD   Encounter Date: 02/25/2018  PT End of Session - 02/25/18 1004    Visit Number  6    Number of Visits  17    Date for PT Re-Evaluation  03/30/18    Authorization Type  Medicare    Authorization Time Period  6/10    PT Start Time  0952    PT Stop Time  1030    PT Time Calculation (min)  38 min    Activity Tolerance  Patient tolerated treatment well    Behavior During Therapy  [facility] for tasks assessed/performed       Past Medical History:  Diagnosis Date  . Anxiety   . Atrial fibrillation ([place])   . Depression   . Diabetes mellitus without complication (HCC)    Type I   . Headache    stress. 1x/month  . Hypertension   . Insulin pump in place   . Motion sickness    cars  . MVP (mitral valve prolapse)   . Neuromuscular disorder (HCC)    leg weakness s/p back surgeries  . Thyroid disease     Past Surgical History:  Procedure Laterality Date  . BACK SURGERY  2004   3 sugeries between Sept and Nov, fusion L2-S1  . CATARACT EXTRACTION W/PHACO Left 11/23/2014   Procedure: CATARACT EXTRACTION PHACO AND INTRAOCULAR LENS PLACEMENT (IOC);  Surgeon: Leandrew Koyanagi, MD;  Location: [facility];  Service: Ophthalmology;  Laterality: Left;  DIABETIC - insulin pump    There were no vitals filed for this visit.  Subjective Assessment - 02/25/18 0954    Subjective  Patient reports increased stiffness, with decreased pain this session from last week. Reports 8/10 pain, but says it is not very limited, but she has not taken her morphine yet this am. Reports yesterday "was a good day" and she was able to stand up much straighter for longer time"    Pertinent  History  Patient is 68 yo female that complains of lumbar pain and inability to walk erect, as well as hip/abdominal pain. Patient with history of lumbar fusion (L2-S1). Xray results show: Fairly stable appearance of the levoscoliosis of the lower thoracic and of the lumbar spine.Stable lateral subluxation toward the left of L2 with respect L3. Stable partial compression of the body of L1. Patient complains of frustration with physicians, and that her symptoms are worsening over time. 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 Reported that her pain and difficulty worsened after trying to pick her husband up about 1 year ago. Patient mentioned that she has been told that she has "spinal fluid pockets that are leaking". PMH also includes DM I with insulin pump,  afib, HTN,  HLD, chronic pain syndrome, SOB.    Limitations  Standing;Walking;House hold activities;Other (comment)    Diagnostic tests  see exray results: Fairly stable appearance of the levoscoliosis of the lower thoracic and lumbar spine. Stable lateral subluxation toward left of L2 with respect of L3, stable partial compression of the body of L1.    Pain Onset  More than a month ago    Pain Onset  More than a month ago  Ther-Ex -Modified thomas stretch 90min; with PT overpressure 5min; bilat - Prone prop 31min; with PT overpressure at hips 63mins - Prone press up from elbows 3x 10 with TC for proper scapular retraction with good carry over with sustained TC - Supine bridge with RTB at knees to encourage hip abd 3x 10 50-75% ROM  - Backward walking 3x 68ft with YTB at knees for resistance with bilat HHA and cuing throughout for increased step length and thoracic ext  - Sidestepping 3x 62ft with YTB at knees for resistance with cuing for thoracic ext and eccentric control with stepping, with good carry over with cuing                        PT Education - 02/25/18 1003     Education Details  Exercise form/technique    Person(s) Educated  Patient    Methods  Explanation;Verbal cues    Comprehension  Verbalized understanding;Verbal cues required       PT Short Term Goals - 11/19/17 1253      PT SHORT TERM GOAL #1   Title  Patient will be adherent to initial HEP at least 3x a week to improve functional strength and balance for better safety at home.    Time  4    Period  Weeks    Status  New    Target Date  12/17/17        PT Long Term Goals - 02/02/18 1152      PT LONG TERM GOAL #1   Title   Patient will be independent in bending down towards floor and picking up small object (<5 pounds) and then stand back up with pain 5/10 or less to improve ability to pick up and clean up room at home    Baseline  1/13/120 5-6/10 pain    Time  8    Period  Weeks    Status  On-going      PT LONG TERM GOAL #2   Title  Patient will increase BLE gross strength to 4+/5 as to improve functional strength for independent gait, increased standing tolerance and increased ADL ability.    Baseline  02/02/18 see eval    Time  8    Period  Weeks    Status  On-going      PT LONG TERM GOAL #3   Title  Patient will report a worst pain of 5/10 with transfers of her husband to improve tolerance with ADLs     Baseline  02/02/18 8/10 pain with transferring husband    Time  8    Period  Weeks    Status  Revised      PT LONG TERM GOAL #4   Title  Pt will decrease 5TSTS by at least 3 seconds in order to demonstrate clinically significant improvement in LE strength    Baseline  02/02/18 23sec    Time  8    Period  Weeks    Status  New      PT LONG TERM GOAL #5   Title  Pt will increase 10MWT by at least 0.13 m/s in order to demonstrate clinically significant improvement in community ambulation    Baseline  02/02/17 0.39m/s    Time  8    Period  Weeks    Status  New            Plan - 02/25/18 1007    Clinical Impression Statement  PT  continued to progress therex to  encourage hip/tspine extension. Patient is able to complete therex with accuracy following PT cuing. Patient with fatigue following all sets with rest breaks needed between all sets. Patient is tolerating increased time in prone as well, which is an improvement.     Rehab Potential  Fair    Clinical Impairments Affecting Rehab Potential  comorbidities, symptom duration, curvature degree    PT Frequency  2x / week    PT Duration  8 weeks    PT Treatment/Interventions  ADLs/Self Care Home Management;Aquatic Therapy;Cryotherapy;Ultrasound;Parrafin;Traction;Moist Heat;Electrical Stimulation;DME Instruction;Gait training;Stair training;Functional mobility training;Neuromuscular re-education;Balance training;Therapeutic exercise;Therapeutic activities;Patient/family education;Energy conservation;Spinal Manipulations;Joint Manipulations;Passive range of motion;Dry needling;Manual techniques    PT Next Visit Plan   hip flexor lengthening/glute strengthening, core control, attempt bridge    Consulted and Agree with Plan of Care  Patient       Patient will benefit from skilled therapeutic intervention in order to improve the following deficits and impairments:  Abnormal gait, Decreased balance, Decreased endurance, Decreased mobility, Difficulty walking, Hypomobility, Increased muscle spasms, Decreased range of motion, Pain, Postural dysfunction, Impaired flexibility, Increased fascial restricitons, Decreased strength, Decreased activity tolerance  Visit Diagnosis: Other idiopathic scoliosis, lumbar region     Problem List Patient Active Problem List   Diagnosis Date Noted  . Central sleep apnea 10/09/2016  . Hypertension 10/09/2016  . Lumbar postlaminectomy syndrome 10/09/2016  . Lumbar radiculopathy 10/09/2016  . Diabetic polyneuropathy ([place]) 10/07/2016  . Right lumbosacral radiculopathy 10/07/2016  . Healthcare maintenance 08/26/2016  . DKA (diabetic ketoacidoses) ([place]) 06/14/2016  . Atypical  chest pain 02/23/2016  . SOB (shortness of breath) 02/23/2016  . Pain medication agreement signed 01/13/2015  . Acquired hypothyroidism 05/30/2014  . Frontal sinusitis 12/17/2013  . Atrial fibrillation ([place]) 11/29/2013  . Major depression in remission ([place]) 11/20/2013  . 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 Chronic pain syndrome 08/27/2013  . Back pain 07/17/2013  . HTN (hypertension), benign 07/17/2013  . Hyperlipidemia 07/17/2013  . Pancreatic insufficiency 07/17/2013  . Chronic postoperative pain 01/01/2013  . Long term current use of opiate analgesic 11/04/2012  . Hypothyroidism 08/25/2012  . Radiculopathy of leg 05/01/2011   Shelton Silvas PT, DPT Shelton Silvas 02/25/2018, 10:25 AM  [facility] [address] [place], [address] Phone: [phone]   Fax:  [phone]  Name: Colleen Payne MRN: [id number] Date of Birth: 1950-05-15

## 2018-02-27 ENCOUNTER — Ambulatory Visit: Payer: Medicare Other | Admitting: Physical Therapy

## 2018-02-27 ENCOUNTER — Encounter: Payer: Self-pay | Admitting: Physical Therapy

## 2018-02-27 DIAGNOSIS — M4126 Other idiopathic scoliosis, lumbar region: Secondary | ICD-10-CM

## 2018-02-27 NOTE — Therapy (Signed)
[facility] [address], [place], [address] Phone: [phone]   Fax:  [phone]  Physical Therapy Treatment  Patient Details  Name: Colleen Payne MRN: [id number] Date of Birth: 07-26-1950 Referring Provider (PT): Traci Sermon MD   Encounter Date: 02/27/2018  PT End of Session - 02/27/18 0923    Visit Number  7    Number of Visits  17    Date for PT Re-Evaluation  03/30/18    Authorization Type  Medicare    Authorization Time Period  7/10    PT Start Time  0917    PT Stop Time  1000    PT Time Calculation (min)  43 min    Activity Tolerance  Patient tolerated treatment well    Behavior During Therapy  [facility] for tasks assessed/performed       Past Medical History:  Diagnosis Date  . Anxiety   . Atrial fibrillation ([place])   . Depression   . Diabetes mellitus without complication (HCC)    Type I   . Headache    stress. 1x/month  . Hypertension   . Insulin pump in place   . Motion sickness    cars  . MVP (mitral valve prolapse)   . Neuromuscular disorder (HCC)    leg weakness s/p back surgeries  . Thyroid disease     Past Surgical History:  Procedure Laterality Date  . BACK SURGERY  2004   3 sugeries between Sept and Nov, fusion L2-S1  . CATARACT EXTRACTION W/PHACO Left 11/23/2014   Procedure: CATARACT EXTRACTION PHACO AND INTRAOCULAR LENS PLACEMENT (IOC);  Surgeon: Leandrew Koyanagi, MD;  Location: [facility];  Service: Ophthalmology;  Laterality: Left;  DIABETIC - insulin pump    There were no vitals filed for this visit.  Subjective Assessment - 02/27/18 0921    Subjective  Patient reports pain is a little better (7/10) and that she has been "walking straighter over the past 2 days". Patient reports compliance with HEP with no questions or concerns    Pertinent History  Patient is 68 yo female that complains of lumbar pain and inability to walk erect, as well as  hip/abdominal pain. Patient with history of lumbar fusion (L2-S1). Xray results show: Fairly stable appearance of the levoscoliosis of the lower thoracic and of the lumbar spine.Stable lateral subluxation toward the left of L2 with respect L3. Stable partial compression of the body of L1. Patient complains of frustration with physicians, and that her symptoms are worsening over time. 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 Reported that her pain and difficulty worsened after trying to pick her husband up about 1 year ago. Patient mentioned that she has been told that she has "spinal fluid pockets that are leaking". PMH also includes DM I with insulin pump,  afib, HTN,  HLD, chronic pain syndrome, SOB.    Limitations  Standing;Walking;House hold activities;Other (comment)    Diagnostic tests  see exray results: Fairly stable appearance of the levoscoliosis of the lower thoracic and lumbar spine. Stable lateral subluxation toward left of L2 with respect of L3, stable partial compression of the body of L1.    Patient Stated Goals  pain relief, standing erect    Pain Onset  More than a month ago    Pain Onset  More than a month ago       Ther-Ex -Modified thomas stretch 71min; with  PT overpressure 26min; bilat -Prone prop 81min; with cuing to "push belly button into mat table" - Prone press upfrom elbowsx10; from hands 2x 10 with min TC to maintain hip contact with mat table - Prone hip ext 2x 10 each LE with cuing for eccentric control and TC initially to prevent hip rotation with good carry over following - Alternating superman 2x 10 each combination with cuing initially for proper form with good carry over following - Standing rows #5 x10; 10# 2x 10 with PT giving min TC at posterior pelvis to encourage proper posture. Standing breaks between - Standing thoracic ext leaning forward onto bolster on elevated mat table 3x 10 with min TC from PT at end of  reps for full ROM. Standing breaks between                        PT Education - 02/27/18 0923    Education Details  exercise form    Person(s) Educated  Patient    Methods  Explanation;Tactile cues;Verbal cues    Comprehension  Verbalized understanding;Verbal cues required;Tactile cues required       PT Short Term Goals - 11/19/17 1253      PT SHORT TERM GOAL #1   Title  Patient will be adherent to initial HEP at least 3x a week to improve functional strength and balance for better safety at home.    Time  4    Period  Weeks    Status  New    Target Date  12/17/17        PT Long Term Goals - 02/02/18 1152      PT LONG TERM GOAL #1   Title   Patient will be independent in bending down towards floor and picking up small object (<5 pounds) and then stand back up with pain 5/10 or less to improve ability to pick up and clean up room at home    Baseline  1/13/120 5-6/10 pain    Time  8    Period  Weeks    Status  On-going      PT LONG TERM GOAL #2   Title  Patient will increase BLE gross strength to 4+/5 as to improve functional strength for independent gait, increased standing tolerance and increased ADL ability.    Baseline  02/02/18 see eval    Time  8    Period  Weeks    Status  On-going      PT LONG TERM GOAL #3   Title  Patient will report a worst pain of 5/10 with transfers of her husband to improve tolerance with ADLs     Baseline  02/02/18 8/10 pain with transferring husband    Time  8    Period  Weeks    Status  Revised      PT LONG TERM GOAL #4   Title  Pt will decrease 5TSTS by at least 3 seconds in order to demonstrate clinically significant improvement in LE strength    Baseline  02/02/18 23sec    Time  8    Period  Weeks    Status  New      PT LONG TERM GOAL #5   Title  Pt will increase 10MWT by at least 0.13 m/s in order to demonstrate clinically significant improvement in community ambulation    Baseline  02/02/17 0.50m/s    Time   8    Period  Weeks  Status  New            Plan - 02/27/18 1610    Clinical Impression Statement  PT continued progression for extension which patient is able to complete with accuracy following PT cuing. Patient is increasing positional tolerance as well with decreased seated rest breaks needed between sets. PT will continue progression as able.     Clinical Presentation  Evolving    Clinical Decision Making  Moderate    Rehab Potential  Fair    Clinical Impairments Affecting Rehab Potential  comorbidities, symptom duration, curvature degree    PT Frequency  2x / week    PT Duration  8 weeks    PT Treatment/Interventions  ADLs/Self Care Home Management;Aquatic Therapy;Cryotherapy;Ultrasound;Parrafin;Traction;Moist Heat;Electrical Stimulation;DME Instruction;Gait training;Stair training;Functional mobility training;Neuromuscular re-education;Balance training;Therapeutic exercise;Therapeutic activities;Patient/family education;Energy conservation;Spinal Manipulations;Joint Manipulations;Passive range of motion;Dry needling;Manual techniques    PT Next Visit Plan   hip flexor lengthening/glute strengthening, core control, attempt bridge    PT Home Exercise Plan  seated thoracic ext, see pt instruction section: diaphragmatic breathing in supine, supine R SB and lat stretch    Consulted and Agree with Plan of Care  Patient       Patient will benefit from skilled therapeutic intervention in order to improve the following deficits and impairments:  Abnormal gait, Decreased balance, Decreased endurance, Decreased mobility, Difficulty walking, Hypomobility, Increased muscle spasms, Decreased range of motion, Pain, Postural dysfunction, Impaired flexibility, Increased fascial restricitons, Decreased strength, Decreased activity tolerance  Visit Diagnosis: Other idiopathic scoliosis, lumbar region     Problem List Patient Active Problem List   Diagnosis Date Noted  . Central sleep apnea  10/09/2016  . Hypertension 10/09/2016  . Lumbar postlaminectomy syndrome 10/09/2016  . Lumbar radiculopathy 10/09/2016  . Diabetic polyneuropathy ([place]) 10/07/2016  . Right lumbosacral radiculopathy 10/07/2016  . Healthcare maintenance 08/26/2016  . DKA (diabetic ketoacidoses) ([place]) 06/14/2016  . Atypical chest pain 02/23/2016  . SOB (shortness of breath) 02/23/2016  . Pain medication agreement signed 01/13/2015  . Acquired hypothyroidism 05/30/2014  . Frontal sinusitis 12/17/2013  . Atrial fibrillation ([place]) 11/29/2013  . Major depression in remission ([place]) 11/20/2013  . 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 Chronic pain syndrome 08/27/2013  . Back pain 07/17/2013  . HTN (hypertension), benign 07/17/2013  . Hyperlipidemia 07/17/2013  . Pancreatic insufficiency 07/17/2013  . Chronic postoperative pain 01/01/2013  . Long term current use of opiate analgesic 11/04/2012  . Hypothyroidism 08/25/2012  . Radiculopathy of leg 05/01/2011   Shelton Silvas PT, DPT Shelton Silvas 02/27/2018, 10:02 AM  [facility] [address] [place], [address] Phone: [phone]   Fax:  [phone]  Name: Colleen Payne MRN: [id number] Date of Birth: September 07, 1950

## 2018-03-04 ENCOUNTER — Encounter: Payer: Self-pay | Admitting: Physical Therapy

## 2018-03-04 ENCOUNTER — Ambulatory Visit: Payer: Medicare Other | Admitting: Physical Therapy

## 2018-03-04 DIAGNOSIS — M4126 Other idiopathic scoliosis, lumbar region: Secondary | ICD-10-CM

## 2018-03-04 NOTE — Therapy (Signed)
[facility] [address], [place], [address] Phone: [phone]   Fax:  [phone]  Physical Therapy Treatment  Patient Details  Name: Colleen Payne MRN: [id number] Date of Birth: 1951-01-02 Referring Provider (PT): Traci Sermon MD   Encounter Date: 03/04/2018  PT End of Session - 03/04/18 1125    Visit Number  8    Number of Visits  17    Date for PT Re-Evaluation  03/30/18    Authorization Type  Medicare    Authorization Time Period  8/10    PT Start Time  1112    PT Stop Time  1155    PT Time Calculation (min)  43 min    Activity Tolerance  Patient tolerated treatment well    Behavior During Therapy  [facility] for tasks assessed/performed       Past Medical History:  Diagnosis Date  . Anxiety   . Atrial fibrillation ([place])   . Depression   . Diabetes mellitus without complication (HCC)    Type I   . Headache    stress. 1x/month  . Hypertension   . Insulin pump in place   . Motion sickness    cars  . MVP (mitral valve prolapse)   . Neuromuscular disorder (HCC)    leg weakness s/p back surgeries  . Thyroid disease     Past Surgical History:  Procedure Laterality Date  . BACK SURGERY  2004   3 sugeries between Sept and Nov, fusion L2-S1  . CATARACT EXTRACTION W/PHACO Left 11/23/2014   Procedure: CATARACT EXTRACTION PHACO AND INTRAOCULAR LENS PLACEMENT (IOC);  Surgeon: Leandrew Koyanagi, MD;  Location: [facility];  Service: Ophthalmology;  Laterality: Left;  DIABETIC - insulin pump    There were no vitals filed for this visit.  Subjective Assessment - 03/04/18 1113    Subjective  Patient reports she is trying to maintain erect standing following PT sessions, but has difficulty with carry over. Reports 7/10 Pain today.     Pertinent History  Patient is 68 yo female that complains of lumbar pain and inability to walk erect, as well as hip/abdominal pain. Patient with history  of lumbar fusion (L2-S1). Xray results show: Fairly stable appearance of the levoscoliosis of the lower thoracic and of the lumbar spine.Stable lateral subluxation toward the left of L2 with respect L3. Stable partial compression of the body of L1. Patient complains of frustration with physicians, and that her symptoms are worsening over time. 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 Reported that her pain and difficulty worsened after trying to pick her husband up about 1 year ago. Patient mentioned that she has been told that she has "spinal fluid pockets that are leaking". PMH also includes DM I with insulin pump,  afib, HTN,  HLD, chronic pain syndrome, SOB.    Limitations  Standing;Walking;House hold activities;Other (comment)    Diagnostic tests  see exray results: Fairly stable appearance of the levoscoliosis of the lower thoracic and lumbar spine. Stable lateral subluxation toward left of L2 with respect of L3, stable partial compression of the body of L1.    Pain Onset  More than a month ago    Pain Onset  More than a month ago       Ther-Ex -Modified thomas stretch 52min; with PT overpressure 88min; bilat -Prone prop 23min; with cuing to "push belly button into mat table" - Prone  press up from hands 3x 10 withgood maintained hip contact on mat table - Alternating superman 3x 10 each combination with good carry over from last session with min cuing for core contraction to prevent from hip rotation - TRX inverted (slight) pull ups 3x 10  with TC for thoracic ext (as opposed to hip flexion) with fair carry over, cues needed throughout exercise  - Squat with TRX to encourage thoracic extension 3x 10 with max TC initially for proper form with maintained upper thoracic ext - Standing MATRIX hip ext 10# 2x 10 each LE with max TC for maintained hip positioning to prevent hip flexion compensation                         PT Education  - 03/04/18 1115    Education Details  exercise form    Person(s) Educated  Patient    Methods  Explanation;Tactile cues;Verbal cues    Comprehension  Verbalized understanding;Verbal cues required;Tactile cues required       PT Short Term Goals - 11/19/17 1253      PT SHORT TERM GOAL #1   Title  Patient will be adherent to initial HEP at least 3x a week to improve functional strength and balance for better safety at home.    Time  4    Period  Weeks    Status  New    Target Date  12/17/17        PT Long Term Goals - 02/02/18 1152      PT LONG TERM GOAL #1   Title   Patient will be independent in bending down towards floor and picking up small object (<5 pounds) and then stand back up with pain 5/10 or less to improve ability to pick up and clean up room at home    Baseline  1/13/120 5-6/10 pain    Time  8    Period  Weeks    Status  On-going      PT LONG TERM GOAL #2   Title  Patient will increase BLE gross strength to 4+/5 as to improve functional strength for independent gait, increased standing tolerance and increased ADL ability.    Baseline  02/02/18 see eval    Time  8    Period  Weeks    Status  On-going      PT LONG TERM GOAL #3   Title  Patient will report a worst pain of 5/10 with transfers of her husband to improve tolerance with ADLs     Baseline  02/02/18 8/10 pain with transferring husband    Time  8    Period  Weeks    Status  Revised      PT LONG TERM GOAL #4   Title  Pt will decrease 5TSTS by at least 3 seconds in order to demonstrate clinically significant improvement in LE strength    Baseline  02/02/18 23sec    Time  8    Period  Weeks    Status  New      PT LONG TERM GOAL #5   Title  Pt will increase 10MWT by at least 0.13 m/s in order to demonstrate clinically significant improvement in community ambulation    Baseline  02/02/17 0.75m/s    Time  8    Period  Weeks    Status  New            Plan - 03/04/18 1150  Clinical Impression  Statement  PT continued progression increasing prone and standing tolerance. Patient completed therex with accuracy following PT cuing, and reports no increased pain, only muscle fatigue. Following patient reports less pain and less stiffness.     Rehab Potential  Fair    Clinical Impairments Affecting Rehab Potential  comorbidities, symptom duration, curvature degree    PT Frequency  2x / week    PT Duration  8 weeks    PT Treatment/Interventions  ADLs/Self Care Home Management;Aquatic Therapy;Cryotherapy;Ultrasound;Parrafin;Traction;Moist Heat;Electrical Stimulation;DME Instruction;Gait training;Stair training;Functional mobility training;Neuromuscular re-education;Balance training;Therapeutic exercise;Therapeutic activities;Patient/family education;Energy conservation;Spinal Manipulations;Joint Manipulations;Passive range of motion;Dry needling;Manual techniques    PT Next Visit Plan   hip flexor lengthening/glute strengthening, core control, attempt bridge    PT Home Exercise Plan  seated thoracic ext, see pt instruction section: diaphragmatic breathing in supine, supine R SB and lat stretch    Consulted and Agree with Plan of Care  Patient       Patient will benefit from skilled therapeutic intervention in order to improve the following deficits and impairments:  Abnormal gait, Decreased balance, Decreased endurance, Decreased mobility, Difficulty walking, Hypomobility, Increased muscle spasms, Decreased range of motion, Pain, Postural dysfunction, Impaired flexibility, Increased fascial restricitons, Decreased strength, Decreased activity tolerance  Visit Diagnosis: Other idiopathic scoliosis, lumbar region     Problem List Patient Active Problem List   Diagnosis Date Noted  . Central sleep apnea 10/09/2016  . Hypertension 10/09/2016  . Lumbar postlaminectomy syndrome 10/09/2016  . Lumbar radiculopathy 10/09/2016  . Diabetic polyneuropathy ([place]) 10/07/2016  . Right lumbosacral  radiculopathy 10/07/2016  . Healthcare maintenance 08/26/2016  . DKA (diabetic ketoacidoses) ([place]) 06/14/2016  . Atypical chest pain 02/23/2016  . SOB (shortness of breath) 02/23/2016  . Pain medication agreement signed 01/13/2015  . Acquired hypothyroidism 05/30/2014  . Frontal sinusitis 12/17/2013  . Atrial fibrillation ([place]) 11/29/2013  . Major depression in remission ([place]) 11/20/2013  . 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 Chronic pain syndrome 08/27/2013  . Back pain 07/17/2013  . HTN (hypertension), benign 07/17/2013  . Hyperlipidemia 07/17/2013  . Pancreatic insufficiency 07/17/2013  . Chronic postoperative pain 01/01/2013  . Long term current use of opiate analgesic 11/04/2012  . Hypothyroidism 08/25/2012  . Radiculopathy of leg 05/01/2011   Colleen Payne PT, DPT Colleen Payne 03/04/2018, 12:00 PM  [facility] [address], [place], [address] Phone: [phone]   Fax:  [phone]  Name: Colleen Payne MRN: [id number] Date of Birth: [birth year]

## 2018-03-06 ENCOUNTER — Encounter: Payer: Self-pay | Admitting: Physical Therapy

## 2018-03-06 ENCOUNTER — Ambulatory Visit: Payer: Medicare Other | Admitting: Physical Therapy

## 2018-03-06 DIAGNOSIS — M4126 Other idiopathic scoliosis, lumbar region: Secondary | ICD-10-CM | POA: Diagnosis not present

## 2018-03-06 NOTE — Therapy (Signed)
[facility] [address] [place], [address] Phone: [phone]   Fax:  [phone]  Physical Therapy Treatment  Patient Details  Name: Colleen Payne MRN: [id number] Date of Birth: Feb 12, 1950 Referring Provider (PT): Traci Sermon MD   Encounter Date: 03/06/2018  PT End of Session - 03/06/18 1022    Visit Number  9    Number of Visits  17    Date for PT Re-Evaluation  03/30/18    Authorization Type  Medicare    Authorization Time Period  9/10    PT Start Time  1015    PT Stop Time  1100    PT Time Calculation (min)  45 min    Activity Tolerance  Patient tolerated treatment well    Behavior During Therapy  [facility] for tasks assessed/performed       Past Medical History:  Diagnosis Date  . Anxiety   . Atrial fibrillation ([place])   . Depression   . Diabetes mellitus without complication (HCC)    Type I   . Headache    stress. 1x/month  . Hypertension   . Insulin pump in place   . Motion sickness    cars  . MVP (mitral valve prolapse)   . Neuromuscular disorder (HCC)    leg weakness s/p back surgeries  . Thyroid disease     Past Surgical History:  Procedure Laterality Date  . BACK SURGERY  2004   3 sugeries between Sept and Nov, fusion L2-S1  . CATARACT EXTRACTION W/PHACO Left 11/23/2014   Procedure: CATARACT EXTRACTION PHACO AND INTRAOCULAR LENS PLACEMENT (IOC);  Surgeon: Leandrew Koyanagi, MD;  Location: [facility];  Service: Ophthalmology;  Laterality: Left;  DIABETIC - insulin pump    There were no vitals filed for this visit.  Subjective Assessment - 03/06/18 1019    Subjective  Patient reports some soreness following last session, but that she has difficulty maintaining after PT sessions. 7/10 pain. Compliance with HEP.     Pertinent History  Patient is 68 yo female that complains of lumbar pain and inability to walk erect, as well as hip/abdominal pain. Patient with  history of lumbar fusion (L2-S1). Xray results show: Fairly stable appearance of the levoscoliosis of the lower thoracic and of the lumbar spine.Stable lateral subluxation toward the left of L2 with respect L3. Stable partial compression of the body of L1. Patient complains of frustration with physicians, and that her symptoms are worsening over time. 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 Reported that her pain and difficulty worsened after trying to pick her husband up about 1 year ago. Patient mentioned that she has been told that she has "spinal fluid pockets that are leaking". PMH also includes DM I with insulin pump,  afib, HTN,  HLD, chronic pain syndrome, SOB.    Limitations  Standing;Walking;House hold activities;Other (comment)    Diagnostic tests  see exray results: Fairly stable appearance of the levoscoliosis of the lower thoracic and lumbar spine. Stable lateral subluxation toward left of L2 with respect of L3, stable partial compression of the body of L1.    Patient Stated Goals  pain relief, standing erect    Pain Onset  More than a month ago    Pain Onset  More than a month ago       Ther-Ex -Modified thomas stretch 5min; with PT overpressure 44min; bilat - Prone press up  from hands 3x 10withgood maintained hip contact on mat table - Alternating hip ext 3x 10 each combination with upper trunk propped to create tspine ext  - Thoracic ext with theraball behind at hip for cuing and min TC from PT for full ext - TRX inverted (slight) pull ups 3x 10  with TC for thoracic ext (as opposed to hip flexion) with fair carry over from last session with TC for ext -Standing MATRIX hip ext 10# 3x 10 each LE with max TC for maintained hip positioning to prevent hip flexion compensation                        PT Education - 03/06/18 1022    Education Details  Exercise form    Person(s) Educated  Patient    Methods   Explanation;Tactile cues    Comprehension  Verbalized understanding;Tactile cues required       PT Short Term Goals - 11/19/17 1253      PT SHORT TERM GOAL #1   Title  Patient will be adherent to initial HEP at least 3x a week to improve functional strength and balance for better safety at home.    Time  4    Period  Weeks    Status  New    Target Date  12/17/17        PT Long Term Goals - 02/02/18 1152      PT LONG TERM GOAL #1   Title   Patient will be independent in bending down towards floor and picking up small object (<5 pounds) and then stand back up with pain 5/10 or less to improve ability to pick up and clean up room at home    Baseline  1/13/120 5-6/10 pain    Time  8    Period  Weeks    Status  On-going      PT LONG TERM GOAL #2   Title  Patient will increase BLE gross strength to 4+/5 as to improve functional strength for independent gait, increased standing tolerance and increased ADL ability.    Baseline  02/02/18 see eval    Time  8    Period  Weeks    Status  On-going      PT LONG TERM GOAL #3   Title  Patient will report a worst pain of 5/10 with transfers of her husband to improve tolerance with ADLs     Baseline  02/02/18 8/10 pain with transferring husband    Time  8    Period  Weeks    Status  Revised      PT LONG TERM GOAL #4   Title  Pt will decrease 5TSTS by at least 3 seconds in order to demonstrate clinically significant improvement in LE strength    Baseline  02/02/18 23sec    Time  8    Period  Weeks    Status  New      PT LONG TERM GOAL #5   Title  Pt will increase 10MWT by at least 0.13 m/s in order to demonstrate clinically significant improvement in community ambulation    Baseline  02/02/17 0.34m/s    Time  8    Period  Weeks    Status  New            Plan - 03/06/18 1102    Clinical Impression Statement  PT continued therex progression, allowing for increased rest between sets and cuing for  accuracy. Patient is increasing  ability to activate extensor musculature and strength as evidenced by more erect standing and increased tolerance to standing and prone activities. PT will continue progression as able.     Rehab Potential  Fair    Clinical Impairments Affecting Rehab Potential  comorbidities, symptom duration, curvature degree    PT Frequency  2x / week    PT Duration  8 weeks    PT Treatment/Interventions  ADLs/Self Care Home Management;Aquatic Therapy;Cryotherapy;Ultrasound;Parrafin;Traction;Moist Heat;Electrical Stimulation;DME Instruction;Gait training;Stair training;Functional mobility training;Neuromuscular re-education;Balance training;Therapeutic exercise;Therapeutic activities;Patient/family education;Energy conservation;Spinal Manipulations;Joint Manipulations;Passive range of motion;Dry needling;Manual techniques    PT Next Visit Plan   hip flexor lengthening/glute strengthening, core control, attempt bridge    PT Home Exercise Plan  seated thoracic ext, see pt instruction section: diaphragmatic breathing in supine, supine R SB and lat stretch    Consulted and Agree with Plan of Care  Patient       Patient will benefit from skilled therapeutic intervention in order to improve the following deficits and impairments:  Abnormal gait, Decreased balance, Decreased endurance, Decreased mobility, Difficulty walking, Hypomobility, Increased muscle spasms, Decreased range of motion, Pain, Postural dysfunction, Impaired flexibility, Increased fascial restricitons, Decreased strength, Decreased activity tolerance  Visit Diagnosis: Other idiopathic scoliosis, lumbar region     Problem List Patient Active Problem List   Diagnosis Date Noted  . Central sleep apnea 10/09/2016  . Hypertension 10/09/2016  . Lumbar postlaminectomy syndrome 10/09/2016  . Lumbar radiculopathy 10/09/2016  . Diabetic polyneuropathy ([place]) 10/07/2016  . Right lumbosacral radiculopathy 10/07/2016  . Healthcare maintenance 08/26/2016   . DKA (diabetic ketoacidoses) ([place]) 06/14/2016  . Atypical chest pain 02/23/2016  . SOB (shortness of breath) 02/23/2016  . Pain medication agreement signed 01/13/2015  . Acquired hypothyroidism 05/30/2014  . Frontal sinusitis 12/17/2013  . Atrial fibrillation ([place]) 11/29/2013  . Major depression in remission ([place]) 11/20/2013  . 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 Chronic pain syndrome 08/27/2013  . Back pain 07/17/2013  . HTN (hypertension), benign 07/17/2013  . Hyperlipidemia 07/17/2013  . Pancreatic insufficiency 07/17/2013  . Chronic postoperative pain 01/01/2013  . Long term current use of opiate analgesic 11/04/2012  . Hypothyroidism 08/25/2012  . Radiculopathy of leg 05/01/2011   Shelton Silvas PT, DPT Shelton Silvas 03/06/2018, 11:07 AM  [facility] [address] [place], [address] Phone: [phone]   Fax:  [phone]  Name: Colleen Payne MRN: [id number] Date of Birth: November 18, 1950

## 2018-03-11 ENCOUNTER — Ambulatory Visit: Payer: Medicare Other | Admitting: Physical Therapy

## 2018-03-13 ENCOUNTER — Ambulatory Visit: Payer: Medicare Other | Admitting: Physical Therapy

## 2018-03-18 ENCOUNTER — Encounter: Payer: Self-pay | Admitting: Physical Therapy

## 2018-03-18 ENCOUNTER — Ambulatory Visit: Payer: Medicare Other | Admitting: Physical Therapy

## 2018-03-18 DIAGNOSIS — M4126 Other idiopathic scoliosis, lumbar region: Secondary | ICD-10-CM | POA: Diagnosis not present

## 2018-03-18 NOTE — Therapy (Addendum)
[facility] [address] [place], [address] Phone: [phone]   Fax:  [phone]  Physical Therapy Treatment/Progress Note Reporting Period 02/02/18 - 03/18/18  Patient Details  Name: Colleen Payne MRN: [id number] Date of Birth: 01-Dec-1950 Referring Provider (PT): Traci Sermon MD   Encounter Date: 03/18/2018  PT End of Session - 03/18/18 1204    Visit Number  10    Number of Visits  33    Date for PT Re-Evaluation  05/25/18    Authorization Type  Medicare    Authorization Time Period  10/10    PT Start Time  1030    PT Stop Time  1115    PT Time Calculation (min)  45 min    Activity Tolerance  Patient tolerated treatment well    Behavior During Therapy  [facility] for tasks assessed/performed       Past Medical History:  Diagnosis Date  . Anxiety   . Atrial fibrillation ([place])   . Depression   . Diabetes mellitus without complication (HCC)    Type I   . Headache    stress. 1x/month  . Hypertension   . Insulin pump in place   . Motion sickness    cars  . MVP (mitral valve prolapse)   . Neuromuscular disorder (HCC)    leg weakness s/p back surgeries  . Thyroid disease     Past Surgical History:  Procedure Laterality Date  . BACK SURGERY  2004   3 sugeries between Sept and Nov, fusion L2-S1  . CATARACT EXTRACTION W/PHACO Left 11/23/2014   Procedure: CATARACT EXTRACTION PHACO AND INTRAOCULAR LENS PLACEMENT (IOC);  Surgeon: Leandrew Koyanagi, MD;  Location: [facility];  Service: Ophthalmology;  Laterality: Left;  DIABETIC - insulin pump    There were no vitals filed for this visit.  Subjective Assessment - 03/18/18 1049    Subjective  Patient reports some soreness following last session wtih 7, occasional 8/10 pain with transferring husband. Patient reports compliance with HEP with increased time spend "upright"     Pertinent History  Patient is 68 yo female that complains of  lumbar pain and inability to walk erect, as well as hip/abdominal pain. Patient with history of lumbar fusion (L2-S1). Xray results show: Fairly stable appearance of the levoscoliosis of the lower thoracic and of the lumbar spine.Stable lateral subluxation toward the left of L2 with respect L3. Stable partial compression of the body of L1. Patient complains of frustration with physicians, and that her symptoms are worsening over time. 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 Reported that her pain and difficulty worsened after trying to pick her husband up about 1 year ago. Patient mentioned that she has been told that she has "spinal fluid pockets that are leaking". PMH also includes DM I with insulin pump,  afib, HTN,  HLD, chronic pain syndrome, SOB.    Limitations  Standing;Walking;House hold activities;Other (comment)    Diagnostic tests  see exray results: Fairly stable appearance of the levoscoliosis of the lower thoracic and lumbar spine. Stable lateral subluxation toward left of L2 with respect of L3, stable partial compression of the body of L1.    Patient Stated Goals  pain relief, standing erect    Pain Onset  More than a month ago       Ther-Ex -Modified thomas stretch 75min; with PT overpressure 2min; bilat - Prone press up from  hands3x 10withgood maintained hip contact on mat table; most elbow elevation seen from sessions to date  - Alternating hip ext3x10each combination with upper trunk propped to create tspine ext; TC to prevent hip rotation compensation and VC for eccentric control with good carry over following - 5x STS over 2 trails trial 1: 16.5sec trial 2: 16.3sec - Modified wall press up (patient reports she has no were where she can complete prone press up at home) x15 with demo and max cuing needed for proper understanding of form with good carry over by the end of reps (HEP update) - Standing hip abd and ext with RTB x10 each  with min cuing for posture (HEP update resistance with RTB) -Review of goals and education on patient on progress made thus far (increasing strength, speed and power) with remaining deficits in standing/upright activity tolerance. Reviewed HEP updates which patient verbalized understanding of.                        PT Education - 03/18/18 1203    Education Details  HEP update, exercise form    Person(s) Educated  Patient    Methods  Explanation;Demonstration;Verbal cues;Handout;Tactile cues    Comprehension  Verbalized understanding;Returned demonstration;Verbal cues required;Tactile cues required       PT Short Term Goals - 11/19/17 1253      PT SHORT TERM GOAL #1   Title  Patient will be adherent to initial HEP at least 3x a week to improve functional strength and balance for better safety at home.    Time  4    Period  Weeks    Status  New    Target Date  12/17/17        PT Long Term Goals - 03/18/18 1035      PT LONG TERM GOAL #1   Title   Patient will be independent in bending down towards floor and picking up small object (<5 pounds) and then stand back up with pain 5/10 or less to improve ability to pick up and clean up room at home    Baseline  03/18/18 Patient is able to complete task with 7/10 pain; which she reports is "very good" because she     Time  8    Period  Weeks      PT LONG TERM GOAL #2   Title  Patient will increase BLE gross strength to 4+/5 as to improve functional strength for independent gait, increased standing tolerance and increased ADL ability.    Baseline  03/18/18 R/L flexion: 4+/4+ ER 4+/4 IR 5/5 ABD 4+/5 Ext 4-/4-    Time  8    Period  Weeks    Status  On-going      PT LONG TERM GOAL #3   Title  Patient will report a worst pain of 5/10 with transfers of her husband to improve tolerance with ADLs     Baseline  03/18/18 8/10 pain with transferring husband    Time  8    Period  Weeks    Status  On-going      PT LONG TERM  GOAL #4   Title  Pt will decrease 5TSTS by at least 3 seconds in order to demonstrate clinically significant improvement in LE strength    Baseline  03/18/18 16.3sec     Time  8    Period  Weeks    Status  Achieved      PT LONG TERM GOAL #  5   Title  Pt will increase 10MWT by at least 0.13 m/s in order to demonstrate clinically significant improvement in community ambulation    Baseline  03/18/18 0.62m/s    Time  8    Period  Weeks    Status  Achieved      Additional Long Term Goals   Additional Long Term Goals  Yes      PT LONG TERM GOAL #7   Title  Patient will increase standing tolerance to 11min without increased pain in order to complete household chores/cooking    Baseline  03/18/18 reports after 36min she has 8/10 pain and is "hunched over"    Time  8    Period  Weeks    Status  New            Plan - 03/18/18 1205    Clinical Impression Statement  PT reassessed patient goals this session. Patient is making good progress toward goals with reguard to strength, speed and power; demonstrating decreased fall risk. PT has been progressing to increased standing therex and will continue this progression as patient reports standing tolerance wis her chief complaint, along with pain. Patient reports pain is better overall but does not reveal this through objective NPRS, PT explained scale to patient to ensure understanding. PT will continue to progress therex to increase postural training in standing as able. PT updated HEP to reflect progression and goals, which patient verbalized and demonstrated understanding of.     Rehab Potential  Fair    Clinical Impairments Affecting Rehab Potential  comorbidities, symptom duration, curvature degree    PT Frequency  2x / week    PT Duration  8 weeks    PT Treatment/Interventions  ADLs/Self Care Home Management;Aquatic Therapy;Cryotherapy;Ultrasound;Parrafin;Traction;Moist Heat;Electrical Stimulation;DME Instruction;Gait training;Stair  training;Functional mobility training;Neuromuscular re-education;Balance training;Therapeutic exercise;Therapeutic activities;Patient/family education;Energy conservation;Spinal Manipulations;Joint Manipulations;Passive range of motion;Dry needling;Manual techniques    PT Next Visit Plan   hip flexor lengthening/glute strengthening, core control, attempt bridge    PT Home Exercise Plan  seated thoracic ext, see pt instruction section: diaphragmatic breathing in supine, supine R SB and lat stretch    Consulted and Agree with Plan of Care  Patient       Patient will benefit from skilled therapeutic intervention in order to improve the following deficits and impairments:  Abnormal gait, Decreased balance, Decreased endurance, Decreased mobility, Difficulty walking, Hypomobility, Increased muscle spasms, Decreased range of motion, Pain, Postural dysfunction, Impaired flexibility, Increased fascial restricitons, Decreased strength, Decreased activity tolerance  Visit Diagnosis: Other idiopathic scoliosis, lumbar region - Plan: PT plan of care cert/re-cert     Problem List Patient Active Problem List   Diagnosis Date Noted  . Central sleep apnea 10/09/2016  . Hypertension 10/09/2016  . Lumbar postlaminectomy syndrome 10/09/2016  . Lumbar radiculopathy 10/09/2016  . Diabetic polyneuropathy ([place]) 10/07/2016  . Right lumbosacral radiculopathy 10/07/2016  . Healthcare maintenance 08/26/2016  . DKA (diabetic ketoacidoses) ([place]) 06/14/2016  . Atypical chest pain 02/23/2016  . SOB (shortness of breath) 02/23/2016  . Pain medication agreement signed 01/13/2015  . Acquired hypothyroidism 05/30/2014  . Frontal sinusitis 12/17/2013  . Atrial fibrillation ([place]) 11/29/2013  . Major depression in remission ([place]) 11/20/2013  . 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 Chronic pain syndrome 08/27/2013  . Back pain 07/17/2013  . HTN (hypertension), benign  07/17/2013  . Hyperlipidemia 07/17/2013  . Pancreatic insufficiency 07/17/2013  . Chronic postoperative pain 01/01/2013  .  Long term current use of opiate analgesic 11/04/2012  . Hypothyroidism 08/25/2012  . Radiculopathy of leg 05/01/2011   Shelton Silvas PT, DPT Shelton Silvas 03/18/2018, 12:09 PM  [facility] [address], [place], [address] Phone: [phone]   Fax:  [phone]  Name: ANNE-MARIE GENSON MRN: [id number] Date of Birth: [birth year]

## 2018-03-20 ENCOUNTER — Encounter: Payer: Self-pay | Admitting: Physical Therapy

## 2018-03-20 ENCOUNTER — Ambulatory Visit: Payer: Medicare Other | Admitting: Physical Therapy

## 2018-03-20 DIAGNOSIS — M4126 Other idiopathic scoliosis, lumbar region: Secondary | ICD-10-CM | POA: Diagnosis not present

## 2018-03-20 NOTE — Therapy (Signed)
[facility] [address] [place], [address] Phone: [phone]   Fax:  [phone]  Physical Therapy Treatment  Patient Details  Name: Colleen Payne MRN: [id number] Date of Birth: [birth year] Referring Provider (PT): Traci Sermon MD   Encounter Date: 03/20/2018  PT End of Session - 03/20/18 1026    Visit Number  11    Number of Visits  33    Date for PT Re-Evaluation  05/25/18    Authorization Type  Medicare    Authorization Time Period  1/10    PT Start Time  1020    PT Stop Time  1100    PT Time Calculation (min)  40 min    Activity Tolerance  Patient tolerated treatment well    Behavior During Therapy  [facility] for tasks assessed/performed       Past Medical History:  Diagnosis Date  . Anxiety   . Atrial fibrillation ([place])   . Depression   . Diabetes mellitus without complication (HCC)    Type I   . Headache    stress. 1x/month  . Hypertension   . Insulin pump in place   . Motion sickness    cars  . MVP (mitral valve prolapse)   . Neuromuscular disorder (HCC)    leg weakness s/p back surgeries  . Thyroid disease     Past Surgical History:  Procedure Laterality Date  . BACK SURGERY  2004   3 sugeries between Sept and Nov, fusion L2-S1  . CATARACT EXTRACTION W/PHACO Left 11/23/2014   Procedure: CATARACT EXTRACTION PHACO AND INTRAOCULAR LENS PLACEMENT (IOC);  Surgeon: Leandrew Koyanagi, MD;  Location: [facility];  Service: Ophthalmology;  Laterality: Left;  DIABETIC - insulin pump    There were no vitals filed for this visit.  Subjective Assessment - 03/20/18 1021    Subjective  Patient reports she felt better over the past 2 days with her back, but reports her blood sugar has been dropping in the middle of the night. Reports it has been normal levels with am. PT encouraged patient to contact PCP about this. Reports her pain has been better, 7/10 over past couple days with  increased pain today following decreased sleep last night 7-8/10. Patient reports "therapy must be working because her brother and sister and law throught she was standing much straighter" which she is very happy with.     Pertinent History  Patient is 68 yo female that complains of lumbar pain and inability to walk erect, as well as hip/abdominal pain. Patient with history of lumbar fusion (L2-S1). Xray results show: Fairly stable appearance of the levoscoliosis of the lower thoracic and of the lumbar spine.Stable lateral subluxation toward the left of L2 with respect L3. Stable partial compression of the body of L1. Patient complains of frustration with physicians, and that her symptoms are worsening over time. 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 Reported that her pain and difficulty worsened after trying to pick her husband up about 1 year ago. Patient mentioned that she has been told that she has "spinal fluid pockets that are leaking". PMH also includes DM I with insulin pump,  afib, HTN,  HLD, chronic pain syndrome, SOB.    Limitations  Standing;Walking;House hold activities;Other (comment)    Diagnostic tests  see exray results: Fairly stable appearance of the levoscoliosis of the lower thoracic and lumbar spine. Stable lateral subluxation toward  left of L2 with respect of L3, stable partial compression of the body of L1.    Patient Stated Goals  pain relief, standing erect    Pain Onset  More than a month ago    Pain Onset  More than a month ago          Ther-Ex -Modified thomas stretch 68min; with PT overpressure 67min; bilat - Standing press up against wall in lunge position 2x 10 with RLE 2x 10 LLE with minA for set up and TC at post hip to maintain hip ext throughout. VC for scapular retraction with push (standing rest breaks between sets - Thoracic ext with theraball behind at hip with row with GTB; cuing for full availible ROM and scapular  retraction with good carry over following; at last set reports she feels as though her heels are coming up  - TRX inverted (slight) pull ups 3x 10 with min cuing to maintain hip ext; standing break between sets 2 and 3; seated rest between sets 1 and 2 and following exercise -Standingon small DF block 72min with cuing to maintain hip ext with scapular retraction with CGA for safety                       PT Education - 03/20/18 1025    Education Details  Exercise form    Methods  Explanation;Verbal cues    Comprehension  Verbalized understanding;Verbal cues required       PT Short Term Goals - 11/19/17 1253      PT SHORT TERM GOAL #1   Title  Patient will be adherent to initial HEP at least 3x a week to improve functional strength and balance for better safety at home.    Time  4    Period  Weeks    Status  New    Target Date  12/17/17        PT Long Term Goals - 03/18/18 1035      PT LONG TERM GOAL #1   Title   Patient will be independent in bending down towards floor and picking up small object (<5 pounds) and then stand back up with pain 5/10 or less to improve ability to pick up and clean up room at home    Baseline  03/18/18 Patient is able to complete task with 7/10 pain; which she reports is "very good" because she     Time  8    Period  Weeks      PT LONG TERM GOAL #2   Title  Patient will increase BLE gross strength to 4+/5 as to improve functional strength for independent gait, increased standing tolerance and increased ADL ability.    Baseline  03/18/18 R/L flexion: 4+/4+ ER 4+/4 IR 5/5 ABD 4+/5 Ext 4-/4-    Time  8    Period  Weeks    Status  On-going      PT LONG TERM GOAL #3   Title  Patient will report a worst pain of 5/10 with transfers of her husband to improve tolerance with ADLs     Baseline  03/18/18 8/10 pain with transferring husband    Time  8    Period  Weeks    Status  On-going      PT LONG TERM GOAL #4   Title  Pt will decrease  5TSTS by at least 3 seconds in order to demonstrate clinically significant improvement in LE strength    Baseline  03/18/18 16.3sec     Time  8    Period  Weeks    Status  Achieved      PT LONG TERM GOAL #5   Title  Pt will increase 10MWT by at least 0.13 m/s in order to demonstrate clinically significant improvement in community ambulation    Baseline  03/18/18 0.28m/s    Time  8    Period  Weeks    Status  Achieved      Additional Long Term Goals   Additional Long Term Goals  Yes      PT LONG TERM GOAL #7   Title  Patient will increase standing tolerance to 21min without increased pain in order to complete household chores/cooking    Baseline  03/18/18 reports after 54min she has 8/10 pain and is "hunched over"    Time  8    Period  Weeks    Status  New            Plan - 03/20/18 1031    Clinical Impression Statement  PT increased therex progression with incressed standing demand and tolerance from patient. Patient is able to complete all therex with accuracy following PT cuing and modification. Patient is reporting less increased pain throughout exercise and PT challenged patient to remain standing between sets to increase standing tolerance.     Rehab Potential  Fair    Clinical Impairments Affecting Rehab Potential  comorbidities, symptom duration, curvature degree    PT Frequency  2x / week    PT Treatment/Interventions  ADLs/Self Care Home Management;Aquatic Therapy;Cryotherapy;Ultrasound;Parrafin;Traction;Moist Heat;Electrical Stimulation;DME Instruction;Gait training;Stair training;Functional mobility training;Neuromuscular re-education;Balance training;Therapeutic exercise;Therapeutic activities;Patient/family education;Energy conservation;Spinal Manipulations;Joint Manipulations;Passive range of motion;Dry needling;Manual techniques    PT Next Visit Plan   hip flexor lengthening/glute strengthening, core control, attempt bridge    PT Home Exercise Plan  seated thoracic  ext, see pt instruction section: diaphragmatic breathing in supine, supine R SB and lat stretch    Consulted and Agree with Plan of Care  Patient       Patient will benefit from skilled therapeutic intervention in order to improve the following deficits and impairments:  Abnormal gait, Decreased balance, Decreased endurance, Decreased mobility, Difficulty walking, Hypomobility, Increased muscle spasms, Decreased range of motion, Pain, Postural dysfunction, Impaired flexibility, Increased fascial restricitons, Decreased strength, Decreased activity tolerance  Visit Diagnosis: Other idiopathic scoliosis, lumbar region     Problem List Patient Active Problem List   Diagnosis Date Noted  . Central sleep apnea 10/09/2016  . Hypertension 10/09/2016  . Lumbar postlaminectomy syndrome 10/09/2016  . Lumbar radiculopathy 10/09/2016  . Diabetic polyneuropathy ([place]) 10/07/2016  . Right lumbosacral radiculopathy 10/07/2016  . Healthcare maintenance 08/26/2016  . DKA (diabetic ketoacidoses) ([place]) 06/14/2016  . Atypical chest pain 02/23/2016  . SOB (shortness of breath) 02/23/2016  . Pain medication agreement signed 01/13/2015  . Acquired hypothyroidism 05/30/2014  . Frontal sinusitis 12/17/2013  . Atrial fibrillation ([place]) 11/29/2013  . Major depression in remission ([place]) 11/20/2013  . 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 Chronic pain syndrome 08/27/2013  . Back pain 07/17/2013  . HTN (hypertension), benign 07/17/2013  . Hyperlipidemia 07/17/2013  . Pancreatic insufficiency 07/17/2013  . Chronic postoperative pain 01/01/2013  . Long term current use of opiate analgesic 11/04/2012  . Hypothyroidism 08/25/2012  . Radiculopathy of leg 05/01/2011   Shelton Silvas PT, DPT Shelton Silvas 03/20/2018, 10:54 AM  [facility] [address] [place], [address] Phone: [phone]    Fax:  [phone]  Name: Colleen Payne MRN: [id number] Date of Birth: 06/29/1950

## 2018-03-23 ENCOUNTER — Ambulatory Visit: Payer: Medicare Other | Attending: Anesthesiology | Admitting: Physical Therapy

## 2018-03-23 ENCOUNTER — Encounter: Payer: Self-pay | Admitting: Physical Therapy

## 2018-03-23 DIAGNOSIS — M4126 Other idiopathic scoliosis, lumbar region: Secondary | ICD-10-CM

## 2018-03-23 DIAGNOSIS — M5416 Radiculopathy, lumbar region: Secondary | ICD-10-CM | POA: Insufficient documentation

## 2018-03-23 NOTE — Therapy (Signed)
[facility] [address] [place], [address] Phone: [phone]   Fax:  [phone]  Physical Therapy Treatment  Patient Details  Name: Colleen Payne MRN: [id number] Date of Birth: September 04, 1950 Referring Provider (PT): Traci Sermon MD   Encounter Date: 03/23/2018  PT End of Session - 03/23/18 0955    Visit Number  12    Number of Visits  33    Date for PT Re-Evaluation  05/25/18    Authorization Type  Medicare    Authorization Time Period  2/10    PT Start Time  0945    PT Stop Time  1030    PT Time Calculation (min)  45 min    Activity Tolerance  Patient tolerated treatment well    Behavior During Therapy  [facility] for tasks assessed/performed       Past Medical History:  Diagnosis Date  . Anxiety   . Atrial fibrillation ([place])   . Depression   . Diabetes mellitus without complication (HCC)    Type I   . Headache    stress. 1x/month  . Hypertension   . Insulin pump in place   . Motion sickness    cars  . MVP (mitral valve prolapse)   . Neuromuscular disorder (HCC)    leg weakness s/p back surgeries  . Thyroid disease     Past Surgical History:  Procedure Laterality Date  . BACK SURGERY  2004   3 sugeries between Sept and Nov, fusion L2-S1  . CATARACT EXTRACTION W/PHACO Left 11/23/2014   Procedure: CATARACT EXTRACTION PHACO AND INTRAOCULAR LENS PLACEMENT (IOC);  Surgeon: Leandrew Koyanagi, MD;  Location: [facility];  Service: Ophthalmology;  Laterality: Left;  DIABETIC - insulin pump    There were no vitals filed for this visit.  Subjective Assessment - 03/23/18 0951    Subjective  Patient reports she did not have a good weekend and that she had trouble standing up straight all weekend. Patient reports she continued her exercises but reports she had trouble with them. Reports 10/10 pain this weekend. Patient reports pain in R shoulder that is botering her since Saturday.     Pertinent History  Patient is 68 yo female that complains of lumbar pain and inability to walk erect, as well as hip/abdominal pain. Patient with history of lumbar fusion (L2-S1). Xray results show: Fairly stable appearance of the levoscoliosis of the lower thoracic and of the lumbar spine.Stable lateral subluxation toward the left of L2 with respect L3. Stable partial compression of the body of L1. Patient complains of frustration with physicians, and that her symptoms are worsening over time. 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 Reported that her pain and difficulty worsened after trying to pick her husband up about 1 year ago. Patient mentioned that she has been told that she has "spinal fluid pockets that are leaking". PMH also includes DM I with insulin pump,  afib, HTN,  HLD, chronic pain syndrome, SOB.    Limitations  Standing;Walking;House hold activities;Other (comment)    Diagnostic tests  see exray results: Fairly stable appearance of the levoscoliosis of the lower thoracic and lumbar spine. Stable lateral subluxation toward left of L2 with respect of L3, stable partial compression of the body of L1.    Patient Stated Goals  pain relief, standing erect    Pain Onset  More than a month ago    Pain  Onset  More than a month ago       Ther-Ex -Modified thomas stretch 72min; with PT overpressure 72min; bilat - Prone prop 66min  - Prone press up 3x 10 with cuing to maintain elbow elevation - MATRIX hip ext 25# x10 eac LE TC to prevent post hip translation -Standingon small DF block 66min with cuing to maintain hip ext with scapular retraction with CGA for safety  - Thoracic ext with theraball behind at hip with row with GTB; Cuing for full available ROM; some difficultly maintaining heel contact to floor d/t increased tension at gastroc                          PT Education - 03/23/18 0954    Education Details  Exercise  form    Person(s) Educated  Patient    Methods  Explanation;Demonstration;Tactile cues;Verbal cues    Comprehension  Verbalized understanding;Returned demonstration;Tactile cues required;Verbal cues required       PT Short Term Goals - 11/19/17 1253      PT SHORT TERM GOAL #1   Title  Patient will be adherent to initial HEP at least 3x a week to improve functional strength and balance for better safety at home.    Time  4    Period  Weeks    Status  New    Target Date  12/17/17        PT Long Term Goals - 03/18/18 1035      PT LONG TERM GOAL #1   Title   Patient will be independent in bending down towards floor and picking up small object (<5 pounds) and then stand back up with pain 5/10 or less to improve ability to pick up and clean up room at home    Baseline  03/18/18 Patient is able to complete task with 7/10 pain; which she reports is "very good" because she     Time  8    Period  Weeks      PT LONG TERM GOAL #2   Title  Patient will increase BLE gross strength to 4+/5 as to improve functional strength for independent gait, increased standing tolerance and increased ADL ability.    Baseline  03/18/18 R/L flexion: 4+/4+ ER 4+/4 IR 5/5 ABD 4+/5 Ext 4-/4-    Time  8    Period  Weeks    Status  On-going      PT LONG TERM GOAL #3   Title  Patient will report a worst pain of 5/10 with transfers of her husband to improve tolerance with ADLs     Baseline  03/18/18 8/10 pain with transferring husband    Time  8    Period  Weeks    Status  On-going      PT LONG TERM GOAL #4   Title  Pt will decrease 5TSTS by at least 3 seconds in order to demonstrate clinically significant improvement in LE strength    Baseline  03/18/18 16.3sec     Time  8    Period  Weeks    Status  Achieved      PT LONG TERM GOAL #5   Title  Pt will increase 10MWT by at least 0.13 m/s in order to demonstrate clinically significant improvement in community ambulation    Baseline  03/18/18 0.24m/s    Time   8    Period  Weeks    Status  Achieved  Additional Long Term Goals   Additional Long Term Goals  Yes      PT LONG TERM GOAL #7   Title  Patient will increase standing tolerance to 29min without increased pain in order to complete household chores/cooking    Baseline  03/18/18 reports after 83min she has 8/10 pain and is "hunched over"    Time  8    Period  Weeks    Status  New            Plan - 03/23/18 1520    Clinical Impression Statement  PT maintained progression with some decreased standing tolerance to reduce most exercise soreness and discouragement to patient. Patient is able to complete all therex with accuracy following some PT cuing. Patient required some increased rest between therex d/t increased pain this session.     Rehab Potential  Fair    Clinical Impairments Affecting Rehab Potential  comorbidities, symptom duration, curvature degree    PT Frequency  2x / week    PT Duration  8 weeks    PT Treatment/Interventions  ADLs/Self Care Home Management;Aquatic Therapy;Cryotherapy;Ultrasound;Parrafin;Traction;Moist Heat;Electrical Stimulation;DME Instruction;Gait training;Stair training;Functional mobility training;Neuromuscular re-education;Balance training;Therapeutic exercise;Therapeutic activities;Patient/family education;Energy conservation;Spinal Manipulations;Joint Manipulations;Passive range of motion;Dry needling;Manual techniques    PT Next Visit Plan   hip flexor lengthening/glute strengthening, core control, attempt bridge    PT Home Exercise Plan  seated thoracic ext, see pt instruction section: diaphragmatic breathing in supine, supine R SB and lat stretch    Consulted and Agree with Plan of Care  Patient       Patient will benefit from skilled therapeutic intervention in order to improve the following deficits and impairments:  Abnormal gait, Decreased balance, Decreased endurance, Decreased mobility, Difficulty walking, Hypomobility, Increased muscle  spasms, Decreased range of motion, Pain, Postural dysfunction, Impaired flexibility, Increased fascial restricitons, Decreased strength, Decreased activity tolerance  Visit Diagnosis: Other idiopathic scoliosis, lumbar region     Problem List Patient Active Problem List   Diagnosis Date Noted  . Central sleep apnea 10/09/2016  . Hypertension 10/09/2016  . Lumbar postlaminectomy syndrome 10/09/2016  . Lumbar radiculopathy 10/09/2016  . Diabetic polyneuropathy ([place]) 10/07/2016  . Right lumbosacral radiculopathy 10/07/2016  . Healthcare maintenance 08/26/2016  . DKA (diabetic ketoacidoses) ([place]) 06/14/2016  . Atypical chest pain 02/23/2016  . SOB (shortness of breath) 02/23/2016  . Pain medication agreement signed 01/13/2015  . Acquired hypothyroidism 05/30/2014  . Frontal sinusitis 12/17/2013  . Atrial fibrillation ([place]) 11/29/2013  . Major depression in remission ([place]) 11/20/2013  . 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 Chronic pain syndrome 08/27/2013  . Back pain 07/17/2013  . HTN (hypertension), benign 07/17/2013  . Hyperlipidemia 07/17/2013  . Pancreatic insufficiency 07/17/2013  . Chronic postoperative pain 01/01/2013  . Long term current use of opiate analgesic 11/04/2012  . Hypothyroidism 08/25/2012  . Radiculopathy of leg 05/01/2011    Shelton Silvas 03/23/2018, 3:21 PM  [facility] [address], [place], [address] Phone: [phone]   Fax:  [phone]  Name: Colleen Payne MRN: [id number] Date of Birth: 10-28-1950

## 2018-03-25 ENCOUNTER — Ambulatory Visit: Payer: Medicare Other

## 2018-03-25 ENCOUNTER — Encounter: Payer: Self-pay | Admitting: Physical Therapy

## 2018-03-25 DIAGNOSIS — M4126 Other idiopathic scoliosis, lumbar region: Secondary | ICD-10-CM | POA: Diagnosis not present

## 2018-03-25 DIAGNOSIS — M5416 Radiculopathy, lumbar region: Secondary | ICD-10-CM

## 2018-03-25 NOTE — Therapy (Signed)
[facility] [address] [place], [address] Phone: [phone]   Fax:  [phone]  Physical Therapy Treatment  Patient Details  Name: Colleen Payne MRN: [id number] Date of Birth: 10-21-1950 Referring Provider (PT): Traci Sermon MD   Encounter Date: 03/25/2018  PT End of Session - 03/25/18 0958    Visit Number  13    Number of Visits  33    Date for PT Re-Evaluation  05/25/18    Authorization Type  Medicare    Authorization Time Period  3/10    PT Start Time  0950    PT Stop Time  1030    PT Time Calculation (min)  40 min    Activity Tolerance  Patient tolerated treatment well;Patient limited by fatigue;Patient limited by pain    Behavior During Therapy  [facility] for tasks assessed/performed       Past Medical History:  Diagnosis Date  . Anxiety   . Atrial fibrillation ([place])   . Depression   . Diabetes mellitus without complication (HCC)    Type I   . Headache    stress. 1x/month  . Hypertension   . Insulin pump in place   . Motion sickness    cars  . MVP (mitral valve prolapse)   . Neuromuscular disorder (HCC)    leg weakness s/p back surgeries  . Thyroid disease     Past Surgical History:  Procedure Laterality Date  . BACK SURGERY  2004   3 sugeries between Sept and Nov, fusion L2-S1  . CATARACT EXTRACTION W/PHACO Left 11/23/2014   Procedure: CATARACT EXTRACTION PHACO AND INTRAOCULAR LENS PLACEMENT (IOC);  Surgeon: Leandrew Koyanagi, MD;  Location: [place];  Service: Ophthalmology;  Laterality: Left;  DIABETIC - insulin pump    There were no vitals filed for this visit.  Subjective Assessment - 03/25/18 0953    Subjective  Pt reports she is stiff today as what is typical. She is indirect about HEP completion, but reports she has to break them up throughout the day as she is busy with caregiver. She reports she continues to have difficulty from constant bending forward  throuhgout the day.     Pertinent History  Patient is 68 yo female that complains of lumbar pain and inability to walk erect, as well as hip/abdominal pain. Patient with history of lumbar fusion (L2-S1). Xray results show: Fairly stable appearance of the levoscoliosis of the lower thoracic and of the lumbar spine.Stable lateral subluxation toward the left of L2 with respect L3. Stable partial compression of the body of L1. Patient complains of frustration with physicians, and that her symptoms are worsening over time. 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 Reported that her pain and difficulty worsened after trying to pick her husband up about 1 year ago. Patient mentioned that she has been told that she has "spinal fluid pockets that are leaking". PMH also includes DM I with insulin pump,  afib, HTN,  HLD, chronic pain syndrome, SOB.    Currently in Pain?  Yes    Pain Score  7     Pain Location  --   low back pain near fusion/fracture site.       Therapeutic Exercise -Modified thomas stretch 66min; with PT overpressure 90min; bilat - Prone prop 7min  - Prone press up 2x10 with cuing to maintain elbow elevation -Prone (2 pilows uder pelvis) SLR 2x10  blat, BUE extension+scapular retraction+thoracic extension 1x10 -Prone W 1x10 (2 pillows under pelvis)  -Hooklying, glute max bridge (feet close to hips and wide) 2x10  -Cervical retraction into 2 pillows 2x15x3secH  -Row Cable machine seated (floor cable) 2x10 @ 25lb tactile cues for thoracic extension stabilization  -Free weight [place] deadlift (unable to teach a hip hinge secondary to gluteal (hip ext) weakness and hesitancy  -STS from chair+airex 2x10 hands free    PT Short Term Goals - 11/19/17 1253      PT SHORT TERM GOAL #1   Title  Patient will be adherent to initial HEP at least 3x a week to improve functional strength and balance for better safety at home.    Time  4    Period  Weeks     Status  New    Target Date  12/17/17        PT Long Term Goals - 03/18/18 1035      PT LONG TERM GOAL #1   Title   Patient will be independent in bending down towards floor and picking up small object (<5 pounds) and then stand back up with pain 5/10 or less to improve ability to pick up and clean up room at home    Baseline  03/18/18 Patient is able to complete task with 7/10 pain; which she reports is "very good" because she     Time  8    Period  Weeks      PT LONG TERM GOAL #2   Title  Patient will increase BLE gross strength to 4+/5 as to improve functional strength for independent gait, increased standing tolerance and increased ADL ability.    Baseline  03/18/18 R/L flexion: 4+/4+ ER 4+/4 IR 5/5 ABD 4+/5 Ext 4-/4-    Time  8    Period  Weeks    Status  On-going      PT LONG TERM GOAL #3   Title  Patient will report a worst pain of 5/10 with transfers of her husband to improve tolerance with ADLs     Baseline  03/18/18 8/10 pain with transferring husband    Time  8    Period  Weeks    Status  On-going      PT LONG TERM GOAL #4   Title  Pt will decrease 5TSTS by at least 3 seconds in order to demonstrate clinically significant improvement in LE strength    Baseline  03/18/18 16.3sec     Time  8    Period  Weeks    Status  Achieved      PT LONG TERM GOAL #5   Title  Pt will increase 10MWT by at least 0.13 m/s in order to demonstrate clinically significant improvement in community ambulation    Baseline  03/18/18 0.36m/s    Time  8    Period  Weeks    Status  Achieved      Additional Long Term Goals   Additional Long Term Goals  Yes      PT LONG TERM GOAL #7   Title  Patient will increase standing tolerance to 103min without increased pain in order to complete household chores/cooking    Baseline  03/18/18 reports after 43min she has 8/10 pain and is "hunched over"    Time  [place] - 03/25/18 1610  Clinical Impression  Statement  Continued with extension based program and interventions aimed at improving functional performance of this system, hip extension, back extension, strength, core stabilization. Pt continues to have limitations from a painful Right shoulder. Pt remains in good spirits motivated to perform activities and improve her pain and function.     Rehab Potential  Fair    Clinical Impairments Affecting Rehab Potential  comorbidities, symptom duration, curvature degree    PT Frequency  2x / week    PT Duration  8 weeks    PT Treatment/Interventions  ADLs/Self Care Home Management;Aquatic Therapy;Cryotherapy;Ultrasound;Parrafin;Traction;Moist Heat;Electrical Stimulation;DME Instruction;Gait training;Stair training;Functional mobility training;Neuromuscular re-education;Balance training;Therapeutic exercise;Therapeutic activities;Patient/family education;Energy conservation;Spinal Manipulations;Joint Manipulations;Passive range of motion;Dry needling;Manual techniques    PT Next Visit Plan   hip flexor lengthening/glute strengthening, core control, attempt bridge    PT Home Exercise Plan  seated thoracic ext, see pt instruction section: diaphragmatic breathing in supine, supine R SB and lat stretch    Consulted and Agree with Plan of Care  Patient       Patient will benefit from skilled therapeutic intervention in order to improve the following deficits and impairments:  Abnormal gait, Decreased balance, Decreased endurance, Decreased mobility, Difficulty walking, Hypomobility, Increased muscle spasms, Decreased range of motion, Pain, Postural dysfunction, Impaired flexibility, Increased fascial restricitons, Decreased strength, Decreased activity tolerance  Visit Diagnosis: Other idiopathic scoliosis, lumbar region  Radiculopathy, lumbar region     Problem List Patient Active Problem List   Diagnosis Date Noted  . Central sleep apnea 10/09/2016  . Hypertension 10/09/2016  . Lumbar  postlaminectomy syndrome 10/09/2016  . Lumbar radiculopathy 10/09/2016  . Diabetic polyneuropathy ([place]) 10/07/2016  . Right lumbosacral radiculopathy 10/07/2016  . Healthcare maintenance 08/26/2016  . DKA (diabetic ketoacidoses) ([place]) 06/14/2016  . Atypical chest pain 02/23/2016  . SOB (shortness of breath) 02/23/2016  . Pain medication agreement signed 01/13/2015  . Acquired hypothyroidism 05/30/2014  . Frontal sinusitis 12/17/2013  . Atrial fibrillation ([place]) 11/29/2013  . Major depression in remission ([place]) 11/20/2013  . 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 Chronic pain syndrome 08/27/2013  . Back pain 07/17/2013  . HTN (hypertension), benign 07/17/2013  . Hyperlipidemia 07/17/2013  . Pancreatic insufficiency 07/17/2013  . Chronic postoperative pain 01/01/2013  . Long term current use of opiate analgesic 11/04/2012  . Hypothyroidism 08/25/2012  . Radiculopathy of leg 05/01/2011   10:17 AM, 03/25/18 Etta Grandchild, PT, DPT Physical Therapist - [place] [phone] (Office)   Jaelah Hauth C 03/25/2018, 10:06 AM  [facility] [address] [place], [address] Phone: [phone]   Fax:  [phone]  Name: Colleen Payne MRN: [id number] Date of Birth: Jan 29, 1950

## 2018-03-30 ENCOUNTER — Ambulatory Visit: Payer: Medicare Other | Admitting: Physical Therapy

## 2018-03-30 ENCOUNTER — Encounter: Payer: Self-pay | Admitting: Physical Therapy

## 2018-03-30 DIAGNOSIS — M4126 Other idiopathic scoliosis, lumbar region: Secondary | ICD-10-CM | POA: Diagnosis not present

## 2018-03-30 NOTE — Therapy (Signed)
[facility] [address], [place], [address] Phone: [phone]   Fax:  [phone]  Physical Therapy Treatment  Patient Details  Name: Colleen Payne MRN: [id number] Date of Birth: 1950-07-15 Referring Provider (PT): Traci Sermon MD   Encounter Date: 03/30/2018  PT End of Session - 03/30/18 0957    Visit Number  14    Number of Visits  33    Date for PT Re-Evaluation  05/25/18    Authorization Type  Medicare    Authorization Time Period  4/10    PT Start Time  0954    PT Stop Time  1032    PT Time Calculation (min)  38 min    Activity Tolerance  Patient tolerated treatment well;Patient limited by fatigue;Patient limited by pain    Behavior During Therapy  [facility] for tasks assessed/performed       Past Medical History:  Diagnosis Date  . Anxiety   . Atrial fibrillation ([place])   . Depression   . Diabetes mellitus without complication (HCC)    Type I   . Headache    stress. 1x/month  . Hypertension   . Insulin pump in place   . Motion sickness    cars  . MVP (mitral valve prolapse)   . Neuromuscular disorder (HCC)    leg weakness s/p back surgeries  . Thyroid disease     Past Surgical History:  Procedure Laterality Date  . BACK SURGERY  2004   3 sugeries between Sept and Nov, fusion L2-S1  . CATARACT EXTRACTION W/PHACO Left 11/23/2014   Procedure: CATARACT EXTRACTION PHACO AND INTRAOCULAR LENS PLACEMENT (IOC);  Surgeon: Leandrew Koyanagi, MD;  Location: [place];  Service: Ophthalmology;  Laterality: Left;  DIABETIC - insulin pump    There were no vitals filed for this visit.  Subjective Assessment - 03/30/18 0955    Subjective  Pt reports increased pain and stiffness today following mopping her floor yesterday, reports 8/10 pain this am. Reports compliance with HEP with no questions or concerns.     Pertinent History  Patient is 68 yo female that complains of lumbar pain  and inability to walk erect, as well as hip/abdominal pain. Patient with history of lumbar fusion (L2-S1). Xray results show: Fairly stable appearance of the levoscoliosis of the lower thoracic and of the lumbar spine.Stable lateral subluxation toward the left of L2 with respect L3. Stable partial compression of the body of L1. Patient complains of frustration with physicians, and that her symptoms are worsening over time. 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 Reported that her pain and difficulty worsened after trying to pick her husband up about 1 year ago. Patient mentioned that she has been told that she has "spinal fluid pockets that are leaking". PMH also includes DM I with insulin pump,  afib, HTN,  HLD, chronic pain syndrome, SOB.    Limitations  Standing;Walking;House hold activities;Other (comment)    Diagnostic tests  see exray results: Fairly stable appearance of the levoscoliosis of the lower thoracic and lumbar spine. Stable lateral subluxation toward left of L2 with respect of L3, stable partial compression of the body of L1.    Patient Stated Goals  pain relief, standing erect    Pain Onset  More than a month ago    Pain Onset  More than a month ago  Ther-Ex -Modified thomas stretch 11min; with PT overpressure 31min; bilat - Prone press up 3x 10 with cuing for scapular retraction and TC to maintain "belly contact" to mat table - Mini squat with TC of mat table for ROM 3x 10 with cuing for full hip ext with good glute contraction - TRX row 3x 10 with cuing scapular retraction and thoracic ext with good carry over following; standing rest breaks between sets - Backward walking with bilat UE support 4x 84ft with cuing for strong push back for glute contraction - Bridge exercise with better hip ext 60% full ROM 3x 10 with cuing for glute contraction with good carry over                     PT Education - 03/30/18  0957    Education Details  Exercise form    Person(s) Educated  Patient    Methods  Explanation;Demonstration;Tactile cues    Comprehension  Verbalized understanding;Returned demonstration;Tactile cues required       PT Short Term Goals - 11/19/17 1253      PT SHORT TERM GOAL #1   Title  Patient will be adherent to initial HEP at least 3x a week to improve functional strength and balance for better safety at home.    Time  4    Period  Weeks    Status  New    Target Date  12/17/17        PT Long Term Goals - 03/18/18 1035      PT LONG TERM GOAL #1   Title   Patient will be independent in bending down towards floor and picking up small object (<5 pounds) and then stand back up with pain 5/10 or less to improve ability to pick up and clean up room at home    Baseline  03/18/18 Patient is able to complete task with 7/10 pain; which she reports is "very good" because she     Time  8    Period  Weeks      PT LONG TERM GOAL #2   Title  Patient will increase BLE gross strength to 4+/5 as to improve functional strength for independent gait, increased standing tolerance and increased ADL ability.    Baseline  03/18/18 R/L flexion: 4+/4+ ER 4+/4 IR 5/5 ABD 4+/5 Ext 4-/4-    Time  8    Period  Weeks    Status  On-going      PT LONG TERM GOAL #3   Title  Patient will report a worst pain of 5/10 with transfers of her husband to improve tolerance with ADLs     Baseline  03/18/18 8/10 pain with transferring husband    Time  8    Period  Weeks    Status  On-going      PT LONG TERM GOAL #4   Title  Pt will decrease 5TSTS by at least 3 seconds in order to demonstrate clinically significant improvement in LE strength    Baseline  03/18/18 16.3sec     Time  8    Period  Weeks    Status  Achieved      PT LONG TERM GOAL #5   Title  Pt will increase 10MWT by at least 0.13 m/s in order to demonstrate clinically significant improvement in community ambulation    Baseline  03/18/18 0.34m/s     Time  8    Period  Weeks    Status  Achieved      Additional Long Term Goals   Additional Long Term Goals  Yes      PT LONG TERM GOAL #7   Title  Patient will increase standing tolerance to 35min without increased pain in order to complete household chores/cooking    Baseline  03/18/18 reports after 67min she has 8/10 pain and is "hunched over"    Time  8    Period  Weeks    Status  New            Plan - 03/30/18 1013    Clinical Impression Statement  PT continued ext based therex for glute and thoracic ext activation. Patient able to complete all therex with accuracy following PT cuing, with no increased pain, only noted muscle fatigue. Patient is able to demonstrate better erect posture following session with reduced pain. PT continued to increase standing tolerance with standing breaks between therex, as much as able.     Rehab Potential  Fair    Clinical Impairments Affecting Rehab Potential  comorbidities, symptom duration, curvature degree    PT Frequency  2x / week    PT Treatment/Interventions  ADLs/Self Care Home Management;Aquatic Therapy;Cryotherapy;Ultrasound;Parrafin;Traction;Moist Heat;Electrical Stimulation;DME Instruction;Gait training;Stair training;Functional mobility training;Neuromuscular re-education;Balance training;Therapeutic exercise;Therapeutic activities;Patient/family education;Energy conservation;Spinal Manipulations;Joint Manipulations;Passive range of motion;Dry needling;Manual techniques    PT Next Visit Plan   hip flexor lengthening/glute strengthening, core control, attempt bridge    PT Home Exercise Plan  seated thoracic ext, see pt instruction section: diaphragmatic breathing in supine, supine R SB and lat stretch    Consulted and Agree with Plan of Care  Patient       Patient will benefit from skilled therapeutic intervention in order to improve the following deficits and impairments:  Abnormal gait, Decreased balance, Decreased endurance,  Decreased mobility, Difficulty walking, Hypomobility, Increased muscle spasms, Decreased range of motion, Pain, Postural dysfunction, Impaired flexibility, Increased fascial restricitons, Decreased strength, Decreased activity tolerance  Visit Diagnosis: Other idiopathic scoliosis, lumbar region     Problem List Patient Active Problem List   Diagnosis Date Noted  . Central sleep apnea 10/09/2016  . Hypertension 10/09/2016  . Lumbar postlaminectomy syndrome 10/09/2016  . Lumbar radiculopathy 10/09/2016  . Diabetic polyneuropathy ([place]) 10/07/2016  . Right lumbosacral radiculopathy 10/07/2016  . Healthcare maintenance 08/26/2016  . DKA (diabetic ketoacidoses) ([place]) 06/14/2016  . Atypical chest pain 02/23/2016  . SOB (shortness of breath) 02/23/2016  . Pain medication agreement signed 01/13/2015  . Acquired hypothyroidism 05/30/2014  . Frontal sinusitis 12/17/2013  . Atrial fibrillation ([place]) 11/29/2013  . Major depression in remission ([place]) 11/20/2013  . 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 Chronic pain syndrome 08/27/2013  . Back pain 07/17/2013  . HTN (hypertension), benign 07/17/2013  . Hyperlipidemia 07/17/2013  . Pancreatic insufficiency 07/17/2013  . Chronic postoperative pain 01/01/2013  . Long term current use of opiate analgesic 11/04/2012  . Hypothyroidism 08/25/2012  . Radiculopathy of leg 05/01/2011   Shelton Silvas PT, DPT Shelton Silvas 03/30/2018, 10:30 AM  [facility] [address], [place], [address] Phone: [phone]   Fax:  [phone]  Name: LOUANNA VANLIEW MRN: [id number] Date of Birth: March 29, 1950

## 2018-04-03 ENCOUNTER — Other Ambulatory Visit: Payer: Self-pay

## 2018-04-03 ENCOUNTER — Encounter: Payer: Self-pay | Admitting: Physical Therapy

## 2018-04-03 ENCOUNTER — Ambulatory Visit: Payer: Medicare Other | Admitting: Physical Therapy

## 2018-04-03 DIAGNOSIS — M4126 Other idiopathic scoliosis, lumbar region: Secondary | ICD-10-CM | POA: Diagnosis not present

## 2018-04-03 NOTE — Therapy (Signed)
[facility] [address], [place], [address] Phone: [phone]   Fax:  [phone]  Physical Therapy Treatment  Patient Details  Name: Colleen Payne MRN: [id number] Date of Birth: [birth year] Referring Provider (PT): Traci Sermon MD   Encounter Date: 04/03/2018  PT End of Session - 04/03/18 0922    Visit Number  15    Number of Visits  33    Date for PT Re-Evaluation  05/25/18    Authorization Type  Medicare    Authorization Time Period  5/10    PT Start Time  0920    PT Stop Time  1000    PT Time Calculation (min)  40 min    Activity Tolerance  Patient tolerated treatment well;Patient limited by fatigue;Patient limited by pain    Behavior During Therapy  [facility] for tasks assessed/performed       Past Medical History:  Diagnosis Date  . Anxiety   . Atrial fibrillation ([place])   . Depression   . Diabetes mellitus without complication (HCC)    Type I   . Headache    stress. 1x/month  . Hypertension   . Insulin pump in place   . Motion sickness    cars  . MVP (mitral valve prolapse)   . Neuromuscular disorder (HCC)    leg weakness s/p back surgeries  . Thyroid disease     Past Surgical History:  Procedure Laterality Date  . BACK SURGERY  2004   3 sugeries between Sept and Nov, fusion L2-S1  . CATARACT EXTRACTION W/PHACO Left 11/23/2014   Procedure: CATARACT EXTRACTION PHACO AND INTRAOCULAR LENS PLACEMENT (IOC);  Surgeon: Leandrew Koyanagi, MD;  Location: [place];  Service: Ophthalmology;  Laterality: Left;  DIABETIC - insulin pump    There were no vitals filed for this visit.  Subjective Assessment - 04/03/18 0926    Subjective  Patient reports 8/10 pain this am. Reports she is very worried about CoronaVirus. Patient "forgot cane" this am and reports she is glad that she did not even think about needing it to walk    Pertinent History  Patient is 68 yo female that  complains of lumbar pain and inability to walk erect, as well as hip/abdominal pain. Patient with history of lumbar fusion (L2-S1). Xray results show: Fairly stable appearance of the levoscoliosis of the lower thoracic and of the lumbar spine.Stable lateral subluxation toward the left of L2 with respect L3. Stable partial compression of the body of L1. Patient complains of frustration with physicians, and that her symptoms are worsening over time. 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 Reported that her pain and difficulty worsened after trying to pick her husband up about 1 year ago. Patient mentioned that she has been told that she has "spinal fluid pockets that are leaking". PMH also includes DM I with insulin pump,  afib, HTN,  HLD, chronic pain syndrome, SOB.    Limitations  Standing;Walking;House hold activities;Other (comment)    Diagnostic tests  see exray results: Fairly stable appearance of the levoscoliosis of the lower thoracic and lumbar spine. Stable lateral subluxation toward left of L2 with respect of L3, stable partial compression of the body of L1.    Patient Stated Goals  pain relief, standing erect    Pain Onset  More than a month ago    Pain Onset  More than a month ago  Ther-Ex -Modified thomas stretch with PT overpressure 69min; bilat - Attempted back ext with upper body off mat table; completed scapular retraction/rows in this position with cuing to maintain neutral spine with chest off mat table 3x 10 with PT stabilizing at hips - Bridge exercise with cuing for  3x 10 with cuing for glute contraction with good carry over  - MATRIX hip ext 40# 3x 10 with TC to prevent post hip translation and encouraging glute contraction with good carry over following - Quadraped hip ext with demo and max TC initially for understanding of hip ext without pelvic rotation with good carry over  following                         PT Education - 04/03/18 0922    Education Details  Exercise form/technique    Person(s) Educated  Patient    Methods  Explanation;Demonstration;Tactile cues;Verbal cues    Comprehension  Verbalized understanding;Returned demonstration;Tactile cues required;Verbal cues required       PT Short Term Goals - 11/19/17 1253      PT SHORT TERM GOAL #1   Title  Patient will be adherent to initial HEP at least 3x a week to improve functional strength and balance for better safety at home.    Time  4    Period  Weeks    Status  New    Target Date  12/17/17        PT Long Term Goals - 03/18/18 1035      PT LONG TERM GOAL #1   Title   Patient will be independent in bending down towards floor and picking up small object (<5 pounds) and then stand back up with pain 5/10 or less to improve ability to pick up and clean up room at home    Baseline  03/18/18 Patient is able to complete task with 7/10 pain; which she reports is "very good" because she     Time  8    Period  Weeks      PT LONG TERM GOAL #2   Title  Patient will increase BLE gross strength to 4+/5 as to improve functional strength for independent gait, increased standing tolerance and increased ADL ability.    Baseline  03/18/18 R/L flexion: 4+/4+ ER 4+/4 IR 5/5 ABD 4+/5 Ext 4-/4-    Time  8    Period  Weeks    Status  On-going      PT LONG TERM GOAL #3   Title  Patient will report a worst pain of 5/10 with transfers of her husband to improve tolerance with ADLs     Baseline  03/18/18 8/10 pain with transferring husband    Time  8    Period  Weeks    Status  On-going      PT LONG TERM GOAL #4   Title  Pt will decrease 5TSTS by at least 3 seconds in order to demonstrate clinically significant improvement in LE strength    Baseline  03/18/18 16.3sec     Time  8    Period  Weeks    Status  Achieved      PT LONG TERM GOAL #5   Title  Pt will increase 10MWT by at least  0.13 m/s in order to demonstrate clinically significant improvement in community ambulation    Baseline  03/18/18 0.60m/s    Time  8    Period  Weeks  Status  Achieved      Additional Long Term Goals   Additional Long Term Goals  Yes      PT LONG TERM GOAL #7   Title  Patient will increase standing tolerance to 51min without increased pain in order to complete household chores/cooking    Baseline  03/18/18 reports after 28min she has 8/10 pain and is "hunched over"    Time  8    Period  Weeks    Status  New            Plan - 04/03/18 1019    Clinical Impression Statement  PT continued ext based therex in gravity resisted positions with patient able to complete all therex with some modifications and accuracy following demo and heavy cuing. Patient is demonstrating better sitting and standing posture overall, with better self correction.     Rehab Potential  Fair    Clinical Impairments Affecting Rehab Potential  comorbidities, symptom duration, curvature degree    PT Frequency  2x / week    PT Duration  8 weeks    PT Treatment/Interventions  ADLs/Self Care Home Management;Aquatic Therapy;Cryotherapy;Ultrasound;Parrafin;Traction;Moist Heat;Electrical Stimulation;DME Instruction;Gait training;Stair training;Functional mobility training;Neuromuscular re-education;Balance training;Therapeutic exercise;Therapeutic activities;Patient/family education;Energy conservation;Spinal Manipulations;Joint Manipulations;Passive range of motion;Dry needling;Manual techniques    PT Next Visit Plan   hip flexor lengthening/glute strengthening, core control, attempt bridge    PT Home Exercise Plan  seated thoracic ext, see pt instruction section: diaphragmatic breathing in supine, supine R SB and lat stretch    Consulted and Agree with Plan of Care  Patient       Patient will benefit from skilled therapeutic intervention in order to improve the following deficits and impairments:  Abnormal gait,  Decreased balance, Decreased endurance, Decreased mobility, Difficulty walking, Hypomobility, Increased muscle spasms, Decreased range of motion, Pain, Postural dysfunction, Impaired flexibility, Increased fascial restricitons, Decreased strength, Decreased activity tolerance  Visit Diagnosis: Other idiopathic scoliosis, lumbar region     Problem List Patient Active Problem List   Diagnosis Date Noted  . Central sleep apnea 10/09/2016  . Hypertension 10/09/2016  . Lumbar postlaminectomy syndrome 10/09/2016  . Lumbar radiculopathy 10/09/2016  . Diabetic polyneuropathy ([place]) 10/07/2016  . Right lumbosacral radiculopathy 10/07/2016  . Healthcare maintenance 08/26/2016  . DKA (diabetic ketoacidoses) ([place]) 06/14/2016  . Atypical chest pain 02/23/2016  . SOB (shortness of breath) 02/23/2016  . Pain medication agreement signed 01/13/2015  . Acquired hypothyroidism 05/30/2014  . Frontal sinusitis 12/17/2013  . Atrial fibrillation ([place]) 11/29/2013  . Major depression in remission ([place]) 11/20/2013  . 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 Chronic pain syndrome 08/27/2013  . Back pain 07/17/2013  . HTN (hypertension), benign 07/17/2013  . Hyperlipidemia 07/17/2013  . Pancreatic insufficiency 07/17/2013  . Chronic postoperative pain 01/01/2013  . Long term current use of opiate analgesic 11/04/2012  . Hypothyroidism 08/25/2012  . Radiculopathy of leg 05/01/2011   Shelton Silvas PT, DPT Shelton Silvas 04/03/2018, 10:25 AM  [facility] [address], [place], [address] Phone: [phone]   Fax:  [phone]  Name: Colleen Payne MRN: [id number] Date of Birth: [birth year]

## 2018-04-06 ENCOUNTER — Ambulatory Visit: Payer: Medicare Other | Admitting: Physical Therapy

## 2018-04-10 ENCOUNTER — Ambulatory Visit: Payer: Medicare Other | Admitting: Physical Therapy

## 2018-04-13 ENCOUNTER — Ambulatory Visit: Payer: Medicare Other | Admitting: Physical Therapy

## 2018-04-15 ENCOUNTER — Ambulatory Visit: Payer: Medicare Other | Admitting: Physical Therapy

## 2018-04-24 ENCOUNTER — Encounter: Payer: Medicare Other | Admitting: Physical Therapy

## 2018-04-26 ENCOUNTER — Encounter: Payer: Self-pay | Admitting: Physical Therapy

## 2018-04-26 NOTE — Therapy (Unsigned)
[facility] [address] [place], [address] Phone: [phone]   Fax:  [phone]  Patient Details  Name: Colleen Payne MRN: [id number] Date of Birth: 21-Nov-1950 Referring Provider:  No ref. provider found  Encounter Date: 04/26/2018 Spoke with patient, updated HEP to require increased standing therex, and encouraged compliance with HEP. Patient interested in telehealth  [facility] PT, DPT Shelton Silvas 04/26/2018, 1:43 PM  [facility] [address] [place], [address] Phone: [phone]   Fax:  [phone]

## 2018-06-04 ENCOUNTER — Emergency Department: Payer: Medicare Other

## 2018-06-04 ENCOUNTER — Encounter: Payer: Self-pay | Admitting: Emergency Medicine

## 2018-06-04 ENCOUNTER — Emergency Department
Admission: EM | Admit: 2018-06-04 | Discharge: 2018-06-04 | Disposition: A | Discharge status: Home / Self Care | Payer: Medicare Other | Attending: Emergency Medicine | Admitting: Emergency Medicine

## 2018-06-04 ENCOUNTER — Other Ambulatory Visit: Payer: Self-pay

## 2018-06-04 DIAGNOSIS — B349 Viral infection, unspecified: Secondary | ICD-10-CM | POA: Diagnosis not present

## 2018-06-04 DIAGNOSIS — E1042 Type 1 diabetes mellitus with diabetic polyneuropathy: Secondary | ICD-10-CM | POA: Insufficient documentation

## 2018-06-04 DIAGNOSIS — E1022 Type 1 diabetes mellitus with diabetic chronic kidney disease: Secondary | ICD-10-CM | POA: Insufficient documentation

## 2018-06-04 DIAGNOSIS — I129 Hypertensive chronic kidney disease with stage 1 through stage 4 chronic kidney disease, or unspecified chronic kidney disease: Secondary | ICD-10-CM | POA: Diagnosis not present

## 2018-06-04 DIAGNOSIS — Z7901 Long term (current) use of anticoagulants: Secondary | ICD-10-CM | POA: Diagnosis not present

## 2018-06-04 DIAGNOSIS — Z20828 Contact with and (suspected) exposure to other viral communicable diseases: Secondary | ICD-10-CM | POA: Insufficient documentation

## 2018-06-04 DIAGNOSIS — R0602 Shortness of breath: Secondary | ICD-10-CM | POA: Diagnosis not present

## 2018-06-04 DIAGNOSIS — E860 Dehydration: Secondary | ICD-10-CM

## 2018-06-04 DIAGNOSIS — Z79899 Other long term (current) drug therapy: Secondary | ICD-10-CM | POA: Diagnosis not present

## 2018-06-04 DIAGNOSIS — E1065 Type 1 diabetes mellitus with hyperglycemia: Secondary | ICD-10-CM

## 2018-06-04 DIAGNOSIS — Z87891 Personal history of nicotine dependence: Secondary | ICD-10-CM | POA: Insufficient documentation

## 2018-06-04 DIAGNOSIS — Z9641 Presence of insulin pump (external) (internal): Secondary | ICD-10-CM | POA: Diagnosis not present

## 2018-06-04 DIAGNOSIS — Z794 Long term (current) use of insulin: Secondary | ICD-10-CM | POA: Diagnosis not present

## 2018-06-04 DIAGNOSIS — R070 Pain in throat: Secondary | ICD-10-CM | POA: Diagnosis not present

## 2018-06-04 DIAGNOSIS — R739 Hyperglycemia, unspecified: Secondary | ICD-10-CM | POA: Diagnosis present

## 2018-06-04 DIAGNOSIS — N182 Chronic kidney disease, stage 2 (mild): Secondary | ICD-10-CM | POA: Diagnosis not present

## 2018-06-04 DIAGNOSIS — E039 Hypothyroidism, unspecified: Secondary | ICD-10-CM | POA: Diagnosis not present

## 2018-06-04 LAB — CBC WITH DIFFERENTIAL/PLATELET
Abs Immature Granulocytes: 0.03 10*3/uL (ref 0.00–0.07)
Basophils Absolute: 0.1 10*3/uL (ref 0.0–0.1)
Basophils Relative: 0 %
Eosinophils Absolute: 0 10*3/uL (ref 0.0–0.5)
Eosinophils Relative: 0 %
HCT: 39.6 % (ref 36.0–46.0)
Hemoglobin: 14.1 g/dL (ref 12.0–15.0)
Immature Granulocytes: 0 %
Lymphocytes Relative: 10 %
Lymphs Abs: 1.2 10*3/uL (ref 0.7–4.0)
MCH: 33.3 pg (ref 26.0–34.0)
MCHC: 35.6 g/dL (ref 30.0–36.0)
MCV: 93.6 fL (ref 80.0–100.0)
Monocytes Absolute: 1 10*3/uL (ref 0.1–1.0)
Monocytes Relative: 8 %
Neutro Abs: 9.9 10*3/uL — ABNORMAL HIGH (ref 1.7–7.7)
Neutrophils Relative %: 82 %
Platelets: 332 10*3/uL (ref 150–400)
RBC: 4.23 MIL/uL (ref 3.87–5.11)
RDW: 12.4 % (ref 11.5–15.5)
WBC: 12.2 10*3/uL — ABNORMAL HIGH (ref 4.0–10.5)
nRBC: 0 % (ref 0.0–0.2)

## 2018-06-04 LAB — COMPREHENSIVE METABOLIC PANEL
ALT: 20 U/L (ref 0–44)
AST: 43 U/L — ABNORMAL HIGH (ref 15–41)
Albumin: 4.3 g/dL (ref 3.5–5.0)
Alkaline Phosphatase: 89 U/L (ref 38–126)
Anion gap: 17 — ABNORMAL HIGH (ref 5–15)
BUN: 32 mg/dL — ABNORMAL HIGH (ref 8–23)
CO2: 20 mmol/L — ABNORMAL LOW (ref 22–32)
Calcium: 9.7 mg/dL (ref 8.9–10.3)
Chloride: 94 mmol/L — ABNORMAL LOW (ref 98–111)
Creatinine, Ser: 1.18 mg/dL — ABNORMAL HIGH (ref 0.44–1.00)
GFR calc Af Amer: 55 mL/min — ABNORMAL LOW (ref >60)
GFR calc non Af Amer: 47 mL/min — ABNORMAL LOW (ref >60)
Glucose, Bld: 305 mg/dL — ABNORMAL HIGH (ref 70–99)
Potassium: 3.7 mmol/L (ref 3.5–5.1)
Sodium: 131 mmol/L — ABNORMAL LOW (ref 135–145)
Total Bilirubin: 1.5 mg/dL — ABNORMAL HIGH (ref 0.3–1.2)
Total Protein: 7.2 g/dL (ref 6.5–8.1)

## 2018-06-04 LAB — BLOOD GAS, VENOUS
Acid-Base Excess: 2.8 mmol/L — ABNORMAL HIGH (ref 0.0–2.0)
Bicarbonate: 28.5 mmol/L — ABNORMAL HIGH (ref 20.0–28.0)
O2 Saturation: 52 %
Patient temperature: 37
pCO2, Ven: 47 mmHg (ref 44.0–60.0)
pH, Ven: 7.39 (ref 7.250–7.430)
pO2, Ven: <31 mmHg — CL (ref 32.0–45.0)

## 2018-06-04 LAB — GLUCOSE, CAPILLARY
Glucose-Capillary: 304 mg/dL — ABNORMAL HIGH (ref 70–99)
Glucose-Capillary: 157 mg/dL — ABNORMAL HIGH (ref 70–99)

## 2018-06-04 LAB — TROPONIN I: Troponin I: <0.03 ng/mL (ref <0.03)

## 2018-06-04 LAB — SARS CORONAVIRUS 2 BY RT PCR (HOSPITAL ORDER, PERFORMED IN ~~LOC~~ HOSPITAL LAB): SARS Coronavirus 2: NEGATIVE

## 2018-06-04 MED ORDER — SODIUM CHLORIDE 0.9 % IV BOLUS
1000.0000 mL | Freq: Once | INTRAVENOUS | Status: AC
  Administered 2018-06-04: 19:00:00 1000 mL via INTRAVENOUS

## 2018-06-04 NOTE — ED Triage Notes (Addendum)
FIRST NURSE NOTE-pt reports blood sugars over 800.  Has been using her insulin.  Has had vomiting. IDDM type I. Has had [facility] since December but has gotten worse.  Normally her sugars this high when infection.

## 2018-06-04 NOTE — ED Triage Notes (Signed)
Pt from home with c/o hyperglycemia of 800 at home this am. PT also states she has SOB and sore throat. Pt cbg 300 at this time. PT A&OX4, appears fatigue. Per family pt spouse has home health coming into home and staff have had + covid contacts.

## 2018-06-04 NOTE — ED Notes (Signed)
Pt a/o, anxious about husband being home alone, concerned about staying overnight. No sob noted. NAD

## 2018-06-04 NOTE — ED Provider Notes (Signed)
[facility] Emergency Department Provider Note  ____________________________________________  Time seen: Approximately 8:01 PM  I have reviewed the triage vital signs and the nursing notes.   HISTORY  Chief Complaint Hyperglycemia and Shortness of Breath   HPI Colleen Payne is a 68 y.o. female with history of diabetes type 1, atrial fibrillation, hypothyroidism who presents for evaluation of hyperglycemia.  Patient reports over the last 2 days her sugars have been in the 800 range.  She uses Humalog 4 times a day per sliding scale.  She is not on a long-acting because she kept becoming hypoglycemic on those.  She was taken off by her PCP several years ago.  She has had a sore throat for 2 days and mild SOB.  She lives at home with her husband who has home health coming in.  It seems like some staff have had a positive COVID contacts.  Patient denies chest pain, fever, vomiting or diarrhea, abdominal pain, dysuria, frequency, hematuria.  Past Medical History:  Diagnosis Date  . Anxiety   . Atrial fibrillation ([place])   . Depression   . Diabetes mellitus without complication (HCC)    Type I   . Headache    stress. 1x/month  . Hypertension   . Insulin pump in place   . Motion sickness    cars  . MVP (mitral valve prolapse)   . Neuromuscular disorder (HCC)    leg weakness s/p back surgeries  . Thyroid disease     Patient Active Problem List   Diagnosis Date Noted  . Central sleep apnea 10/09/2016  . Hypertension 10/09/2016  . Lumbar postlaminectomy syndrome 10/09/2016  . Lumbar radiculopathy 10/09/2016  . Diabetic polyneuropathy ([place]) 10/07/2016  . Right lumbosacral radiculopathy 10/07/2016  . Healthcare maintenance 08/26/2016  . DKA (diabetic ketoacidoses) ([place]) 06/14/2016  . Atypical chest pain 02/23/2016  . SOB (shortness of breath) 02/23/2016  . Pain medication agreement signed 01/13/2015  . Acquired hypothyroidism 05/30/2014  . Frontal  sinusitis 12/17/2013  . Atrial fibrillation ([place]) 11/29/2013  . Major depression in remission ([place]) 11/20/2013  . 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 Chronic pain syndrome 08/27/2013  . Back pain 07/17/2013  . HTN (hypertension), benign 07/17/2013  . Hyperlipidemia 07/17/2013  . Pancreatic insufficiency 07/17/2013  . Chronic postoperative pain 01/01/2013  . Long term current use of opiate analgesic 11/04/2012  . Hypothyroidism 08/25/2012  . Radiculopathy of leg 05/01/2011    Past Surgical History:  Procedure Laterality Date  . BACK SURGERY  2004   3 sugeries between Sept and Nov, fusion L2-S1  . CATARACT EXTRACTION W/PHACO Left 11/23/2014   Procedure: CATARACT EXTRACTION PHACO AND INTRAOCULAR LENS PLACEMENT (IOC);  Surgeon: Leandrew Koyanagi, MD;  Location: [facility];  Service: Ophthalmology;  Laterality: Left;  DIABETIC - insulin pump    Prior to Admission medications   Medication Sig Start Date End Date Taking? Authorizing Provider  amitriptyline (ELAVIL) 25 MG tablet Take 25 mg by mouth as needed.  01/26/10   [provider]  Buprenorphine (BUTRANS) 15 MCG/HR PTWK Place 15 mcg onto the skin once a week.     [provider]  glucose blood (ONE TOUCH ULTRA TEST) test strip USE TO TEST BLOOD SUGAR 8 TIMES DAILY. DX: E10.42 04/17/15   Philemon Kingdom, MD  HUMALOG 100 UNIT/ML injection INJECT 1ML (100 UNITS) INTO THE SKIN DAILY. FOR USE IN PUMP 10/25/15   Philemon Kingdom, MD  hydrochlorothiazide (  HYDRODIURIL) 25 MG tablet Take 25 mg by mouth daily.  08/13/13 06/14/17  [provider]  insulin aspart (NOVOLOG) 100 UNIT/ML injection Use 100 units daily via insulin pump. Patient not taking: Reported on 06/14/2016 05/19/15   Philemon Kingdom, MD  levothyroxine (SYNTHROID, LEVOTHROID) 25 MCG tablet Take 25 mcg by mouth daily before breakfast.  10/15/11   [provider]  metoprolol succinate (TOPROL-XL)  50 MG 24 hr tablet Take 150 mg by mouth daily with breakfast. 1 tablet with dinner. 10/15/11   [provider]  morphine (MSIR) 15 MG tablet Take 15 mg by mouth every 6 (six) hours as needed for moderate pain or severe pain.  06/04/13   [provider]  oxycodone-acetaminophen (LYNOX) 10-300 MG tablet Take 1 tablet by mouth 3 (three) times daily.    [provider]  ramipril (ALTACE) 10 MG capsule Take 10 mg by mouth daily with breakfast.  02/14/11   [provider]  warfarin (COUMADIN) 1 MG tablet Take 1 mg by mouth daily.    [provider]  warfarin (COUMADIN) 6 MG tablet Take 6 mg by mouth daily with breakfast.  09/21/13 06/14/17  [provider]    Allergies Aspirin; Codeine; Contrast media [iodinated diagnostic agents]; and Gadolinium derivatives  Family History  Problem Relation Age of Onset  . Hypertension Mother   . Cancer Mother        Colon  . Hypertension Father     Social History Social History   Tobacco Use  . Smoking status: Former Smoker    Start date: 10/26/2001  . Smokeless tobacco: Never Used  Substance Use Topics  . Alcohol use: No  . Drug use: No    Review of Systems  Constitutional: Negative for fever. Eyes: Negative for visual changes. ENT: + sore throat. Neck: No neck pain  Cardiovascular: Negative for chest pain. Respiratory: + shortness of breath and cough Gastrointestinal: Negative for abdominal pain, vomiting or diarrhea. Genitourinary: Negative for dysuria. Musculoskeletal: Negative for back pain. Skin: Negative for rash. Neurological: Negative for headaches, weakness or numbness. Psych: No SI or HI  ____________________________________________   PHYSICAL EXAM:  VITAL SIGNS: ED Triage Vitals  Enc Vitals Group     BP 06/04/18 1717 (!) 134/55     Pulse Rate 06/04/18 1715 82     Resp 06/04/18 1717 18     Temp 06/04/18 1717 98 F (36.7 C)     Temp Source 06/04/18 1717 Oral     SpO2  06/04/18 1715 98 %     Weight 06/04/18 1717 116 lb (52.6 kg)     Height 06/04/18 1717 5\' 4"  (1.626 m)     Head Circumference --      Peak Flow --      Pain Score 06/04/18 1717 0     Pain Loc --      Pain Edu? --      Excl. in [place]? --     Constitutional: Alert and oriented. Well appearing and in no apparent distress. HEENT:      Head: Normocephalic and atraumatic.         Eyes: Conjunctivae are normal. Sclera is non-icteric.  Oropharynx is clear      Mouth/Throat: Mucous membranes are moist.       Neck: Supple with no signs of meningismus. Cardiovascular: Regular rate and rhythm. No murmurs, gallops, or rubs. 2+ symmetrical distal pulses are present in all extremities. No JVD. Respiratory: Normal respiratory effort. Lungs are clear  to auscultation bilaterally. No wheezes, crackles, or rhonchi.  Gastrointestinal: Soft, non tender, and non distended with positive bowel sounds. No rebound or guarding. Musculoskeletal: Nontender with normal range of motion in all extremities. No edema, cyanosis, or erythema of extremities. Neurologic: Normal speech and language. Face is symmetric. Moving all extremities. No gross focal neurologic deficits are appreciated. Skin: Skin is warm, dry and intact. No rash noted. Psychiatric: Mood and affect are normal. Speech and behavior are normal.  ____________________________________________   LABS (all labs ordered are listed, but only abnormal results are displayed)  Labs Reviewed  GLUCOSE, CAPILLARY - Abnormal; Notable for the following components:      Result Value   Glucose-Capillary 304 (*)    All other components within normal limits  CBC WITH DIFFERENTIAL/PLATELET - Abnormal; Notable for the following components:   WBC 12.2 (*)    Neutro Abs 9.9 (*)    All other components within normal limits  COMPREHENSIVE METABOLIC PANEL - Abnormal; Notable for the following components:   Sodium 131 (*)    Chloride 94 (*)    CO2 20 (*)    Glucose, Bld 305  (*)    BUN 32 (*)    Creatinine, Ser 1.18 (*)    AST 43 (*)    Total Bilirubin 1.5 (*)    GFR calc non Af Amer 47 (*)    GFR calc Af Amer 55 (*)    Anion gap 17 (*)    All other components within normal limits  BLOOD GAS, VENOUS - Abnormal; Notable for the following components:   pO2, Ven <31.0 (*)    Bicarbonate 28.5 (*)    Acid-Base Excess 2.8 (*)    All other components within normal limits  GLUCOSE, CAPILLARY - Abnormal; Notable for the following components:   Glucose-Capillary 157 (*)    All other components within normal limits  SARS CORONAVIRUS 2 (HOSPITAL ORDER, [facility])  TROPONIN I  URINALYSIS, COMPLETE (UACMP) WITH MICROSCOPIC   ____________________________________________  EKG  ED ECG REPORT I, Rudene Re, the attending physician, personally viewed and interpreted this ECG.  Normal sinus rhythm, rate of 71, normal intervals, borderline right axis deviation, T wave inversions in inferior and lateral leads, new when compared to prior. ____________________________________________  RADIOLOGY  I have personally reviewed the images performed during this visit and I agree with the Radiologist's read.   Interpretation by Radiologist:  Dg Chest Portable 1 View  Result Date: 06/04/2018 CLINICAL DATA:  Shortness of breath EXAM: PORTABLE CHEST 1 VIEW COMPARISON:  11/05/2017 FINDINGS: The heart size is mildly enlarged. There is no pneumothorax or large pleural effusion. There is no area of consolidation. No acute osseous abnormality. IMPRESSION: No active disease.  Mild cardiomegaly. Electronically Signed   By: Constance Holster M.D.   On: 06/04/2018 19:12     ____________________________________________   PROCEDURES  Procedure(s) performed: None Procedures Critical Care performed:  None ____________________________________________   INITIAL IMPRESSION / ASSESSMENT AND PLAN / ED COURSE   68 y.o. female with history of diabetes  type 1, atrial fibrillation, hypothyroidism who presents for evaluation of hyperglycemia in the setting of several days of sore throat and mild shortness of breath.  Patient has normal work of breathing, normal sats, lungs are clear to auscultation.  EKG shows no evidence of dysrhythmias.  She does have new T wave inversions in inferior and lateral leads.  She has no chest pain, troponin is negative.  Chest x-ray with no evidence  of pneumonia or pulmonary edema.  Patient with mild leukocytosis, white count of 12.2 consistent with a viral syndrome.  COVID-19 negative.  Initial labs showing glucose of 305, mildly elevated creatinine with an anion gap of 17 consistent with dehydration.  VBG showing normal pH with no indication of DKA.  Patient received a liter of fluids and her repeat blood glucose was 157. Patient tolerated p.o. with no difficulty.  Recommended supportive care and close follow-up with primary care doctor for better management of patient's diabetes.  Discussed in a return precautions for worsening shortness of breath, chest pain, or fever      As part of my medical decision making, I reviewed the following data within the [facility] notes reviewed and incorporated, Labs reviewed , EKG interpreted , Old EKG reviewed, Old chart reviewed, Radiograph reviewed , Notes from prior ED visits and [place] Controlled Substance Database    Pertinent labs & imaging results that were available during my care of the patient were reviewed by me and considered in my medical decision making (see chart for details).    ____________________________________________   FINAL CLINICAL IMPRESSION(S) / ED DIAGNOSES  Final diagnoses:  Hyperglycemia due to type 1 diabetes mellitus (HCC)  Dehydration  Viral syndrome      NEW MEDICATIONS STARTED DURING THIS VISIT:  ED Discharge Orders    None       Note:  This document was prepared using Dragon voice recognition software and may  include unintentional dictation errors.    Alfred Levins, [place], MD 06/04/18 2026

## 2018-06-08 ENCOUNTER — Ambulatory Visit: Payer: Medicare Other | Admitting: Physical Therapy

## 2018-06-10 ENCOUNTER — Ambulatory Visit: Payer: Medicare Other | Attending: Anesthesiology | Admitting: Physical Therapy

## 2018-06-10 ENCOUNTER — Other Ambulatory Visit: Payer: Self-pay

## 2018-06-10 ENCOUNTER — Encounter: Payer: Self-pay | Admitting: Physical Therapy

## 2018-06-10 DIAGNOSIS — M4126 Other idiopathic scoliosis, lumbar region: Secondary | ICD-10-CM | POA: Diagnosis not present

## 2018-06-10 NOTE — Therapy (Addendum)
[facility] [address] [place], [address] Phone: [phone]   Fax:  [phone]  Physical Therapy Treatment/ Re-Evaluation  Patient Details  Name: Colleen Payne MRN: [id number] Date of Birth: [birth year] Referring Provider (PT): Traci Sermon MD   Encounter Date: 06/10/2018  PT End of Session - 06/10/18 1024    Visit Number  1    Number of Visits  17    Date for PT Re-Evaluation  08/05/18    PT Start Time  0936    PT Stop Time  1025    PT Time Calculation (min)  49 min    Activity Tolerance  Patient tolerated treatment well;Patient limited by fatigue;Patient limited by pain    Behavior During Therapy  [facility] for tasks assessed/performed       Past Medical History:  Diagnosis Date  . Anxiety   . Atrial fibrillation ([place])   . Depression   . Diabetes mellitus without complication (HCC)    Type I   . Headache    stress. 1x/month  . Hypertension   . Insulin pump in place   . Motion sickness    cars  . MVP (mitral valve prolapse)   . Neuromuscular disorder (HCC)    leg weakness s/p back surgeries  . Thyroid disease     Past Surgical History:  Procedure Laterality Date  . BACK SURGERY  2004   3 sugeries between Sept and Nov, fusion L2-S1  . CATARACT EXTRACTION W/PHACO Left 11/23/2014   Procedure: CATARACT EXTRACTION PHACO AND INTRAOCULAR LENS PLACEMENT (IOC);  Surgeon: Leandrew Koyanagi, MD;  Location: [facility];  Service: Ophthalmology;  Laterality: Left;  DIABETIC - insulin pump    There were no vitals filed for this visit.  Subjective Assessment - 06/10/18 0944    Subjective  Patient reports she has been feeling rough lately trying to take care of her husband on her own with decreased CNA help d/t COVID outbreak. Patient reports she has not been many exercises, but did get a cot to sleep on which she thinks is helping. Patient reports she had been experiencing some trouble  with her blood pressure and was tested for COVID 19 which was negative. Reports she has had a new sore throat starting Monday after being tested Friday.     Pertinent History  Patient is 67 yo female that complains of lumbar pain and inability to walk erect, as well as hip/abdominal pain. Patient with history of lumbar fusion (L2-S1). Xray results show: Fairly stable appearance of the levoscoliosis of the lower thoracic and of the lumbar spine.Stable lateral subluxation toward the left of L2 with respect L3. Stable partial compression of the body of L1. Patient complains of frustration with physicians, and that her symptoms are worsening over time. 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 Reported that her pain and difficulty worsened after trying to pick her husband up about 1 year ago. Patient mentioned that she has been told that she has "spinal fluid pockets that are leaking". PMH also includes DM I with insulin pump,  afib, HTN,  HLD, chronic pain syndrome, SOB.    Limitations  Standing;Walking;House hold activities;Other (comment)    Diagnostic tests  see exray results: Fairly stable appearance of the levoscoliosis of the lower thoracic and lumbar spine. Stable lateral subluxation toward left of L2 with respect of L3, stable partial compression of the body of L1.  Patient Stated Goals  pain relief, standing erect    Pain Onset  More than a month ago        Therapeutic Activity:  Review of goals: 10MWT 5xSTS And pain assessment over varies ADL tasks PT discussed findings  Of reassessment with patient. Patient reports she felt discouraged after missing therapy. Therapist took time to explain results and encourage patient that she has not regressed and has been able to maintain progress through HEP, which is positive. Patient reports she "just does not feel well since going to the hospital". PT checked vitals as follows:  Vitals BP 139/47 02 95 PR  74 Temp 98.9  pt reports she has not taken her "BP medication" this am and feels as though that may be part of why she does not feel well. PT and patient had a discussion with education given on anxiety and sleep health's impact on pain and pain perception. 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 Patient reports she has been sleeping on a cot which is better for positioning, but reports "her mind does not let her fall asleep". PT educated patient on sleep health, and proper sleep cycles needed for muscle recovery, brain health, and various other benefits. Pt verbalizes understanding, and agrees to discuss this with doctor should it continue.                    PT Education - 06/10/18 1606    Education Details  Pain and anxiety    Person(s) Educated  Patient    Methods  Explanation;Demonstration;Tactile cues;Verbal cues    Comprehension  Verbalized understanding;Returned demonstration;Tactile cues required;Verbal cues required       PT Short Term Goals - 06/10/18 0949      PT SHORT TERM GOAL #1   Title  Patient will be adherent to initial HEP at least 3x a week to improve functional strength and balance for better safety at home.    Time  4    Period  Weeks    Status  New    Target Date  12/17/17        PT Long Term Goals - 06/10/18 0949      PT LONG TERM GOAL #1   Title   Patient will be independent in bending down towards floor and picking up small object (<5 pounds) and then stand back up with pain 5/10 or less to improve ability to pick up and clean up room at home    Baseline  06/10/18 8/10 with "all motion"    Time  8    Period  Weeks    Status  Revised      PT LONG TERM GOAL #2   Title  Patient will increase BLE gross strength to 4+/5 as to improve functional strength for independent gait, increased standing tolerance and  increased ADL ability.    Baseline  03/18/18 R/L flexion: 4+/4+ ER 4+/4 IR 5/5 ABD 4+/5 Ext 4-/4-    Time  8    Period  Weeks    Status  Deferred      PT LONG TERM GOAL #3   Title  Patient will report a worst pain of 5/10 with transfers of her husband to improve tolerance with ADLs     Baseline  06/10/18 8/10 pain with transferring husband    Time  8  Period  Weeks    Status  Revised      PT LONG TERM GOAL #4   Title  Pt will decrease 5TSTS by at least 3 seconds in order to demonstrate clinically significant improvement in LE strength    Baseline  06/10/18 16sec    Time  8    Period  Weeks    Status  Revised      PT LONG TERM GOAL #5   Title  Pt will increase 10MWT by at least 0.13 m/s in order to demonstrate clinically significant improvement in community ambulation    Baseline  06/10/18 0.62m/s 15sec    Time  8    Period  Weeks    Status  Revised      PT LONG TERM GOAL #7   Title  Patient will increase standing tolerance to 29min without increased pain in order to complete household chores/cooking    Baseline  06/10/18/20 reports after 69min she has 8/10 pain and is "hunched over"    Time  8    Period  Weeks    Status  Revised            Plan - 06/11/18 1523    Clinical Impression Statement  PT reassessed goals this session following prolonged absence with good maintenance (no progress or regression) with HEP over 25months. PT had extensive discussion with patient on sleep health and anxiety impact on physical function and health (see therapy note_. Pateint responded well to all educaiton and verbalized good understnading. PT encoruaged patient to reach out to PCP about this issues to ensure she is being treating in a holistic approach. Will continue therex progression as able. Patient does not feel well today reporting feeling "down and week". Vitals in therapy note.     Rehab Potential  Fair    Clinical Impairments Affecting Rehab Potential  comorbidities, symptom  duration, curvature degree    PT Frequency  2x / week    PT Duration  8 weeks    PT Treatment/Interventions  ADLs/Self Care Home Management;Aquatic Therapy;Cryotherapy;Ultrasound;Parrafin;Traction;Moist Heat;Electrical Stimulation;DME Instruction;Gait training;Stair training;Functional mobility training;Neuromuscular re-education;Balance training;Therapeutic exercise;Therapeutic activities;Patient/family education;Energy conservation;Spinal Manipulations;Joint Manipulations;Passive range of motion;Dry needling;Manual techniques    PT Next Visit Plan   hip flexor lengthening/glute strengthening, core control, attempt bridge    PT Home Exercise Plan  seated thoracic ext, see pt instruction section: diaphragmatic breathing in supine, supine R SB and lat stretch    Consulted and Agree with Plan of Care  Patient       Patient will benefit from skilled therapeutic intervention in order to improve the following deficits and impairments:  Abnormal gait, Decreased balance, Decreased endurance, Decreased mobility, Difficulty walking, Hypomobility, Increased muscle spasms, Decreased range of motion, Pain, Postural dysfunction, Impaired flexibility, Increased fascial restricitons, Decreased strength, Decreased activity tolerance  Visit Diagnosis: Other idiopathic scoliosis, lumbar region     Problem List Patient Active Problem List   Diagnosis Date Noted  . Central sleep apnea 10/09/2016  . Hypertension 10/09/2016  . Lumbar postlaminectomy syndrome 10/09/2016  . Lumbar radiculopathy 10/09/2016  . Diabetic polyneuropathy ([place]) 10/07/2016  . Right lumbosacral radiculopathy 10/07/2016  . Healthcare maintenance 08/26/2016  . DKA (diabetic ketoacidoses) ([place]) 06/14/2016  . Atypical chest pain 02/23/2016  . SOB (shortness of breath) 02/23/2016  . Pain medication agreement signed 01/13/2015  . Acquired hypothyroidism 05/30/2014  . Frontal sinusitis 12/17/2013  . Atrial fibrillation ([place]) 11/29/2013   . Major depression in remission ([place]) 11/20/2013  . 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 Chronic pain syndrome 08/27/2013  . Back pain 07/17/2013  . HTN (hypertension), benign 07/17/2013  . Hyperlipidemia 07/17/2013  . Pancreatic insufficiency 07/17/2013  . Chronic postoperative pain 01/01/2013  . Long term current use of opiate analgesic 11/04/2012  . Hypothyroidism 08/25/2012  . Radiculopathy of leg 05/01/2011   Shelton Silvas PT, DPT Shelton Silvas 06/11/2018, 3:26 PM  [facility] [address] [place], [address] Phone: [phone]   Fax:  [phone]  Name: Colleen Payne MRN: [id number] Date of Birth: [birth year]

## 2018-06-17 ENCOUNTER — Encounter: Payer: Self-pay | Admitting: Physical Therapy

## 2018-06-17 ENCOUNTER — Ambulatory Visit: Payer: Medicare Other | Admitting: Physical Therapy

## 2018-06-17 ENCOUNTER — Other Ambulatory Visit: Payer: Self-pay

## 2018-06-17 DIAGNOSIS — M4126 Other idiopathic scoliosis, lumbar region: Secondary | ICD-10-CM

## 2018-06-17 NOTE — Therapy (Signed)
[facility] [address], [place], [address] Phone: [phone]   Fax:  [phone]  Physical Therapy Treatment  Patient Details  Name: Colleen Payne MRN: [id number] Date of Birth: 02-20-1950 Referring Provider (PT): Traci Sermon MD   Encounter Date: 06/17/2018  PT End of Session - 06/17/18 0947    Visit Number  2    Number of Visits  17    Date for PT Re-Evaluation  08/05/18    Authorization Type  Medicare    Authorization Time Period  1/10    PT Start Time  0930    PT Stop Time  1015    PT Time Calculation (min)  45 min    Activity Tolerance  Patient tolerated treatment well;Patient limited by fatigue;Patient limited by pain    Behavior During Therapy  [facility] for tasks assessed/performed       Past Medical History:  Diagnosis Date  . Anxiety   . Atrial fibrillation ([place])   . Depression   . Diabetes mellitus without complication (HCC)    Type I   . Headache    stress. 1x/month  . Hypertension   . Insulin pump in place   . Motion sickness    cars  . MVP (mitral valve prolapse)   . Neuromuscular disorder (HCC)    leg weakness s/p back surgeries  . Thyroid disease     Past Surgical History:  Procedure Laterality Date  . BACK SURGERY  2004   3 sugeries between Sept and Nov, fusion L2-S1  . CATARACT EXTRACTION W/PHACO Left 11/23/2014   Procedure: CATARACT EXTRACTION PHACO AND INTRAOCULAR LENS PLACEMENT (IOC);  Surgeon: Leandrew Koyanagi, MD;  Location: [facility];  Service: Ophthalmology;  Laterality: Left;  DIABETIC - insulin pump    There were no vitals filed for this visit.  Subjective Assessment - 06/17/18 0936    Subjective  Patient reports decreased blood sugar symptoms, and just physically weak. Patient reports she continues to feel "very stiff" with 8/10 pain in LB.     Pertinent History  Patient is 68 yo female that complains of lumbar pain and inability to walk  erect, as well as hip/abdominal pain. Patient with history of lumbar fusion (L2-S1). Xray results show: Fairly stable appearance of the levoscoliosis of the lower thoracic and of the lumbar spine.Stable lateral subluxation toward the left of L2 with respect L3. Stable partial compression of the body of L1. Patient complains of frustration with physicians, and that her symptoms are worsening over time. 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 Reported that her pain and difficulty worsened after trying to pick her husband up about 1 year ago. Patient mentioned that she has been told that she has "spinal fluid pockets that are leaking". PMH also includes DM I with insulin pump,  afib, HTN,  HLD, chronic pain syndrome, SOB.    Limitations  Standing;Walking;House hold activities;Other (comment)    Diagnostic tests  see exray results: Fairly stable appearance of the levoscoliosis of the lower thoracic and lumbar spine. Stable lateral subluxation toward left of L2 with respect of L3, stable partial compression of the body of L1.    Patient Stated Goals  pain relief, standing erect    Pain Onset  More than a month ago    Pain Onset  More than a month ago         Ther-Ex -Modified thomas stretch  gravity dependent 85min each side; with PT overpressure 33min bilat - Prone lying 84min with good ant contact with mat table; prone elbow prop 97min with PT pressure at pelvis to maintain contact wit mat table - Prone press up 50% ROM 3x 6/6/8 with TC for scapular retraction  - Mini squat with elevated mat table for TC for ROM x10, min VC for proper form with full hip ext at rise phase - Mini squat with row with RTB resistance from interior/ ant position to pt 2x 10 with max cuing for proper form initially with good carry over following - MATRIX hip ext 10# 3x 10 with TC to prevent post hip translation and encouraging glute contraction with good carry over following -  Standing lumbar ext with scapular retraction over mat table arms across chest with PT stabilizing hips 3x 8/6/8 with minA for last reps for full ROM, cuing throughout for proper form                       PT Education - 06/17/18 0943    Education Details  Exercise form    Person(s) Educated  Patient    Methods  Explanation;Demonstration;Verbal cues;Tactile cues    Comprehension  Verbalized understanding;Returned demonstration;Verbal cues required;Tactile cues required       PT Short Term Goals - 06/10/18 0949      PT SHORT TERM GOAL #1   Title  Patient will be adherent to initial HEP at least 3x a week to improve functional strength and balance for better safety at home.    Time  4    Period  Weeks    Status  New    Target Date  12/17/17        PT Long Term Goals - 06/10/18 0949      PT LONG TERM GOAL #1   Title   Patient will be independent in bending down towards floor and picking up small object (<5 pounds) and then stand back up with pain 5/10 or less to improve ability to pick up and clean up room at home    Baseline  06/10/18 8/10 with "all motion"    Time  8    Period  Weeks    Status  Revised      PT LONG TERM GOAL #2   Title  Patient will increase BLE gross strength to 4+/5 as to improve functional strength for independent gait, increased standing tolerance and increased ADL ability.    Baseline  03/18/18 R/L flexion: 4+/4+ ER 4+/4 IR 5/5 ABD 4+/5 Ext 4-/4-    Time  8    Period  Weeks    Status  Deferred      PT LONG TERM GOAL #3   Title  Patient will report a worst pain of 5/10 with transfers of her husband to improve tolerance with ADLs     Baseline  06/10/18 8/10 pain with transferring husband    Time  8    Period  Weeks    Status  Revised      PT LONG TERM GOAL #4   Title  Pt will decrease 5TSTS by at least 3 seconds in order to demonstrate clinically significant improvement in LE strength    Baseline  06/10/18 16sec    Time  8    Period   Weeks    Status  Revised      PT LONG TERM GOAL #5   Title  Pt will increase  10MWT by at least 0.13 m/s in order to demonstrate clinically significant improvement in community ambulation    Baseline  06/10/18 0.8m/s 15sec    Time  8    Period  Weeks    Status  Revised      PT LONG TERM GOAL #7   Title  Patient will increase standing tolerance to 29min without increased pain in order to complete household chores/cooking    Baseline  06/10/18/20 reports after 44min she has 8/10 pain and is "hunched over"    Time  8    Period  Weeks    Status  Revised            Plan - 06/17/18 1021    Clinical Impression Statement  PT progressed therex as able this session. Patient is demonstrating some weakness and decreased ROM d/t decreased participation/compliance from [place] pandemic. Patient is able to complete therex with cuing and encouragement, but does need some reminders on form. No increased pain through session, only mucle fatigue. PT will continue progression to allow for carry over into better body mechanics and posture as able.     Rehab Potential  Fair    Clinical Impairments Affecting Rehab Potential  comorbidities, symptom duration, curvature degree    PT Frequency  2x / week    PT Duration  8 weeks    PT Treatment/Interventions  ADLs/Self Care Home Management;Aquatic Therapy;Cryotherapy;Ultrasound;Parrafin;Traction;Moist Heat;Electrical Stimulation;DME Instruction;Gait training;Stair training;Functional mobility training;Neuromuscular re-education;Balance training;Therapeutic exercise;Therapeutic activities;Patient/family education;Energy conservation;Spinal Manipulations;Joint Manipulations;Passive range of motion;Dry needling;Manual techniques    PT Next Visit Plan   hip flexor lengthening/glute strengthening, core control, attempt bridge    PT Home Exercise Plan  seated thoracic ext, see pt instruction section: diaphragmatic breathing in supine, supine R SB and lat stretch     Consulted and Agree with Plan of Care  Patient       Patient will benefit from skilled therapeutic intervention in order to improve the following deficits and impairments:  Abnormal gait, Decreased balance, Decreased endurance, Decreased mobility, Difficulty walking, Hypomobility, Increased muscle spasms, Decreased range of motion, Pain, Postural dysfunction, Impaired flexibility, Increased fascial restricitons, Decreased strength, Decreased activity tolerance  Visit Diagnosis: Other idiopathic scoliosis, lumbar region     Problem List Patient Active Problem List   Diagnosis Date Noted  . Central sleep apnea 10/09/2016  . Hypertension 10/09/2016  . Lumbar postlaminectomy syndrome 10/09/2016  . Lumbar radiculopathy 10/09/2016  . Diabetic polyneuropathy ([place]) 10/07/2016  . Right lumbosacral radiculopathy 10/07/2016  . Healthcare maintenance 08/26/2016  . DKA (diabetic ketoacidoses) ([place]) 06/14/2016  . Atypical chest pain 02/23/2016  . SOB (shortness of breath) 02/23/2016  . Pain medication agreement signed 01/13/2015  . Acquired hypothyroidism 05/30/2014  . Frontal sinusitis 12/17/2013  . Atrial fibrillation ([place]) 11/29/2013  . Major depression in remission ([place]) 11/20/2013  . Type 1 diabetes mellitus with stage 2 chronic kidney disease ([place]) 10/26/2013  . Chronic pain syndrome 08/27/2013  . Back pain 07/17/2013  . HTN (hypertension), benign 07/17/2013  . Hyperlipidemia 07/17/2013  . Pancreatic insufficiency 07/17/2013  . Chronic postoperative pain 01/01/2013  . Long term current use of opiate analgesic 11/04/2012  . Hypothyroidism 08/25/2012  . Radiculopathy of leg 05/01/2011   Shelton Silvas PT, DPT Shelton Silvas 06/17/2018, 10:23 AM  [facility] [address], [place], [address] Phone: [phone]   Fax:  [phone]  Name: Colleen Payne MRN: [id number] Date of Birth: 02-24-1950

## 2018-06-22 ENCOUNTER — Other Ambulatory Visit: Payer: Self-pay

## 2018-06-22 ENCOUNTER — Ambulatory Visit: Payer: Medicare Other | Attending: Anesthesiology | Admitting: Physical Therapy

## 2018-06-22 ENCOUNTER — Encounter: Payer: Self-pay | Admitting: Physical Therapy

## 2018-06-22 DIAGNOSIS — M4126 Other idiopathic scoliosis, lumbar region: Secondary | ICD-10-CM | POA: Diagnosis not present

## 2018-06-22 DIAGNOSIS — M5416 Radiculopathy, lumbar region: Secondary | ICD-10-CM

## 2018-06-22 NOTE — Therapy (Signed)
[facility] [address] [place], [address] Phone: [phone]   Fax:  [phone]  Physical Therapy Treatment  Patient Details  Name: Colleen Payne MRN: [id number] Date of Birth: [birth year] Referring Provider (PT): Traci Sermon MD   Encounter Date: 06/22/2018  PT End of Session - 06/22/18 0941    Visit Number  3    Number of Visits  17    Date for PT Re-Evaluation  08/05/18    Authorization Type  Medicare    Authorization Time Period  3/10    PT Start Time  0930    PT Stop Time  1015    PT Time Calculation (min)  45 min    Activity Tolerance  Patient tolerated treatment well;Patient limited by fatigue;Patient limited by pain    Behavior During Therapy  [facility] for tasks assessed/performed       Past Medical History:  Diagnosis Date  . Anxiety   . Atrial fibrillation ([place])   . Depression   . Diabetes mellitus without complication (HCC)    Type I   . Headache    stress. 1x/month  . Hypertension   . Insulin pump in place   . Motion sickness    cars  . MVP (mitral valve prolapse)   . Neuromuscular disorder (HCC)    leg weakness s/p back surgeries  . Thyroid disease     Past Surgical History:  Procedure Laterality Date  . BACK SURGERY  2004   3 sugeries between Sept and Nov, fusion L2-S1  . CATARACT EXTRACTION W/PHACO Left 11/23/2014   Procedure: CATARACT EXTRACTION PHACO AND INTRAOCULAR LENS PLACEMENT (IOC);  Surgeon: Leandrew Koyanagi, MD;  Location: [facility];  Service: Ophthalmology;  Laterality: Left;  DIABETIC - insulin pump    There were no vitals filed for this visit.  Subjective Assessment - 06/22/18 0935    Subjective  Patient reports she is sleeping better, but in her chair, not the cot. Patient reports 8/0pain and feeling weak, but a little less stiff from last session    Pertinent History  Patient is 68 yo female that complains of lumbar pain and inability to  walk erect, as well as hip/abdominal pain. Patient with history of lumbar fusion (L2-S1). Xray results show: Fairly stable appearance of the levoscoliosis of the lower thoracic and of the lumbar spine.Stable lateral subluxation toward the left of L2 with respect L3. Stable partial compression of the body of L1. Patient complains of frustration with physicians, and that her symptoms are worsening over time. 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 Reported that her pain and difficulty worsened after trying to pick her husband up about 1 year ago. Patient mentioned that she has been told that she has "spinal fluid pockets that are leaking". PMH also includes DM I with insulin pump,  afib, HTN,  HLD, chronic pain syndrome, SOB.    Limitations  Standing;Walking;House hold activities;Other (comment)    Diagnostic tests  see exray results: Fairly stable appearance of the levoscoliosis of the lower thoracic and lumbar spine. Stable lateral subluxation toward left of L2 with respect of L3, stable partial compression of the body of L1.    Patient Stated Goals  pain relief, standing erect    Pain Onset  More than a month ago    Pain Onset  More than a month ago       Ther-Ex -Modified thomas  stretch gravity dependent 29min each side; with PT overpressure 4min bilat - Prone elbow prop 19min with PT pressure at pelvis to maintain contact wit mat table - Prone press up 50% ROM 3x 10 with TC for scapular retraction  - Mini squat with row with RTB resistance 3x 10 with difficulty balancing d/t weakness with squat, cuing for equal wt bearing, with patient wanting to increase WB on LLE with RLE raise, able to correct with TC - Walking backward 4 trials of 15-37ft with HHA and cuing for upright posture and strong hip ext push back - Sidestepping 2x 41ft each direction GTB with HHA and cuing needed for upright posture and foot clearance; decent carry over - Standing lumbar  ext with scapular retraction over mat table arms across chest with PT stabilizing hips 3x 10/8/6 with minA for last reps for full ROM, cuing throughout for proper form                        PT Education - 06/22/18 0940    Education Details  Exercise form    Person(s) Educated  Patient    Methods  Explanation;Demonstration;Tactile cues;Verbal cues    Comprehension  Verbal cues required;Returned demonstration;Verbalized understanding;Tactile cues required       PT Short Term Goals - 06/10/18 0949      PT SHORT TERM GOAL #1   Title  Patient will be adherent to initial HEP at least 3x a week to improve functional strength and balance for better safety at home.    Time  4    Period  Weeks    Status  New    Target Date  12/17/17        PT Long Term Goals - 06/10/18 0949      PT LONG TERM GOAL #1   Title   Patient will be independent in bending down towards floor and picking up small object (<5 pounds) and then stand back up with pain 5/10 or less to improve ability to pick up and clean up room at home    Baseline  06/10/18 8/10 with "all motion"    Time  8    Period  Weeks    Status  Revised      PT LONG TERM GOAL #2   Title  Patient will increase BLE gross strength to 4+/5 as to improve functional strength for independent gait, increased standing tolerance and increased ADL ability.    Baseline  03/18/18 R/L flexion: 4+/4+ ER 4+/4 IR 5/5 ABD 4+/5 Ext 4-/4-    Time  8    Period  Weeks    Status  Deferred      PT LONG TERM GOAL #3   Title  Patient will report a worst pain of 5/10 with transfers of her husband to improve tolerance with ADLs     Baseline  06/10/18 8/10 pain with transferring husband    Time  8    Period  Weeks    Status  Revised      PT LONG TERM GOAL #4   Title  Pt will decrease 5TSTS by at least 3 seconds in order to demonstrate clinically significant improvement in LE strength    Baseline  06/10/18 16sec    Time  8    Period  Weeks     Status  Revised      PT LONG TERM GOAL #5   Title  Pt will increase 10MWT by at least  0.13 m/s in order to demonstrate clinically significant improvement in community ambulation    Baseline  06/10/18 0.9m/s 15sec    Time  8    Period  Weeks    Status  Revised      PT LONG TERM GOAL #7   Title  Patient will increase standing tolerance to 17min without increased pain in order to complete household chores/cooking    Baseline  06/10/18/20 reports after 29min she has 8/10 pain and is "hunched over"    Time  8    Period  Weeks    Status  Revised            Plan - 06/22/18 1010    Clinical Impression Statement  PT continued therex progression and standing tolerance this session, with no increased pain, only noted muscle fatigue. Pt is able to complete all therex with accuracy following PT cuing, and some assistance needed for safety. Patient demonstrates good effort and motivation throughout session; PT will continue progression as able.     Rehab Potential  Fair    Clinical Impairments Affecting Rehab Potential  comorbidities, symptom duration, curvature degree    PT Frequency  2x / week    PT Duration  8 weeks    PT Treatment/Interventions  ADLs/Self Care Home Management;Aquatic Therapy;Cryotherapy;Ultrasound;Parrafin;Traction;Moist Heat;Electrical Stimulation;DME Instruction;Gait training;Stair training;Functional mobility training;Neuromuscular re-education;Balance training;Therapeutic exercise;Therapeutic activities;Patient/family education;Energy conservation;Spinal Manipulations;Joint Manipulations;Passive range of motion;Dry needling;Manual techniques    PT Next Visit Plan   hip flexor lengthening/glute strengthening, core control, attempt bridge    PT Home Exercise Plan  seated thoracic ext, see pt instruction section: diaphragmatic breathing in supine, supine R SB and lat stretch    Consulted and Agree with Plan of Care  Patient       Patient will benefit from skilled  therapeutic intervention in order to improve the following deficits and impairments:  Abnormal gait, Decreased balance, Decreased endurance, Decreased mobility, Difficulty walking, Hypomobility, Increased muscle spasms, Decreased range of motion, Pain, Postural dysfunction, Impaired flexibility, Increased fascial restricitons, Decreased strength, Decreased activity tolerance  Visit Diagnosis: Other idiopathic scoliosis, lumbar region  Radiculopathy, lumbar region     Problem List Patient Active Problem List   Diagnosis Date Noted  . Central sleep apnea 10/09/2016  . Hypertension 10/09/2016  . Lumbar postlaminectomy syndrome 10/09/2016  . Lumbar radiculopathy 10/09/2016  . Diabetic polyneuropathy ([place]) 10/07/2016  . Right lumbosacral radiculopathy 10/07/2016  . Healthcare maintenance 08/26/2016  . DKA (diabetic ketoacidoses) ([place]) 06/14/2016  . Atypical chest pain 02/23/2016  . SOB (shortness of breath) 02/23/2016  . Pain medication agreement signed 01/13/2015  . Acquired hypothyroidism 05/30/2014  . Frontal sinusitis 12/17/2013  . Atrial fibrillation ([place]) 11/29/2013  . Major depression in remission ([place]) 11/20/2013  . 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 Chronic pain syndrome 08/27/2013  . Back pain 07/17/2013  . HTN (hypertension), benign 07/17/2013  . Hyperlipidemia 07/17/2013  . Pancreatic insufficiency 07/17/2013  . Chronic postoperative pain 01/01/2013  . Long term current use of opiate analgesic 11/04/2012  . Hypothyroidism 08/25/2012  . Radiculopathy of leg 05/01/2011   Shelton Silvas PT, DPT Shelton Silvas 06/22/2018, 11:02 AM  [facility] [address] [place], [address] Phone: [phone]   Fax:  [phone]  Name: Colleen Payne MRN: [id number] Date of Birth: 1950-07-11

## 2018-06-24 ENCOUNTER — Other Ambulatory Visit: Payer: Self-pay

## 2018-06-24 ENCOUNTER — Encounter: Payer: Self-pay | Admitting: Physical Therapy

## 2018-06-24 ENCOUNTER — Ambulatory Visit: Payer: Medicare Other | Admitting: Physical Therapy

## 2018-06-24 DIAGNOSIS — M4126 Other idiopathic scoliosis, lumbar region: Secondary | ICD-10-CM

## 2018-06-24 NOTE — Therapy (Signed)
[facility] [address], [place], [address] Phone: [phone]   Fax:  [phone]  Physical Therapy Treatment  Patient Details  Name: Colleen Payne MRN: [id number] Date of Birth: 1950/07/12 Referring Provider (PT): Traci Sermon MD   Encounter Date: 06/24/2018  PT End of Session - 06/24/18 0938    Visit Number  4    Number of Visits  17    Date for PT Re-Evaluation  08/05/18    Authorization Type  Medicare    Authorization Time Period  4/10    PT Start Time  0932    PT Stop Time  1015    PT Time Calculation (min)  43 min    Activity Tolerance  Patient tolerated treatment well;Patient limited by fatigue;Patient limited by pain    Behavior During Therapy  [facility] for tasks assessed/performed       Past Medical History:  Diagnosis Date  . Anxiety   . Atrial fibrillation ([place])   . Depression   . Diabetes mellitus without complication (HCC)    Type I   . Headache    stress. 1x/month  . Hypertension   . Insulin pump in place   . Motion sickness    cars  . MVP (mitral valve prolapse)   . Neuromuscular disorder (HCC)    leg weakness s/p back surgeries  . Thyroid disease     Past Surgical History:  Procedure Laterality Date  . BACK SURGERY  2004   3 sugeries between Sept and Nov, fusion L2-S1  . CATARACT EXTRACTION W/PHACO Left 11/23/2014   Procedure: CATARACT EXTRACTION PHACO AND INTRAOCULAR LENS PLACEMENT (IOC);  Surgeon: Leandrew Koyanagi, MD;  Location: [facility];  Service: Ophthalmology;  Laterality: Left;  DIABETIC - insulin pump    There were no vitals filed for this visit.  Subjective Assessment - 06/24/18 0936    Subjective  Patient reports 7/10 pain today, reports she feels a little better this morning. Reports minimal compliance with HEP.     Pertinent History  Patient is 68 yo female that complains of lumbar pain and inability to walk erect, as well as hip/abdominal  pain. Patient with history of lumbar fusion (L2-S1). Xray results show: Fairly stable appearance of the levoscoliosis of the lower thoracic and of the lumbar spine.Stable lateral subluxation toward the left of L2 with respect L3. Stable partial compression of the body of L1. Patient complains of frustration with physicians, and that her symptoms are worsening over time. 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 Reported that her pain and difficulty worsened after trying to pick her husband up about 1 year ago. Patient mentioned that she has been told that she has "spinal fluid pockets that are leaking". PMH also includes DM I with insulin pump,  afib, HTN,  HLD, chronic pain syndrome, SOB.    Limitations  Standing;Walking;House hold activities;Other (comment)    Diagnostic tests  see exray results: Fairly stable appearance of the levoscoliosis of the lower thoracic and lumbar spine. Stable lateral subluxation toward left of L2 with respect of L3, stable partial compression of the body of L1.    Patient Stated Goals  pain relief, standing erect    Pain Onset  More than a month ago    Pain Onset  More than a month ago         Ther-Ex -Modified thomas stretchwith PT overpressure 41minbilat - [address]  x10 with cuing to prevent hip rotation; 2x 10 with GTB at knees for abd activation with min TC initially to prevent L hip ER and R hip IR with good carry over following - Prone alt supermans 3x 8 each side with difficulty lifting LUE; cuing for eccentric control; attempted full superman following, patient unable - Theraball thoracic ext HHA for balance 3x 10 with cuing for scapular retraction, difficulty maintaining heel contact throughout - DF stretch on wedge with cuing for hip ext 2x 30sec hold - Prone elbow prop 52min with PT pressure at pelvis to maintain contact wit mat table - MATRIX hip ext 25# 3x 10 each side with TC to prevent posterior hip  translation - OMEGA standing rows 10# with demo and cuing initially for proper technique with good carry over following                       PT Education - 06/24/18 0937    Education Details  Exercise form    Person(s) Educated  Patient    Methods  Explanation;Demonstration;Tactile cues;Verbal cues    Comprehension  Verbalized understanding;Tactile cues required;Returned demonstration;Verbal cues required       PT Short Term Goals - 06/10/18 0949      PT SHORT TERM GOAL #1   Title  Patient will be adherent to initial HEP at least 3x a week to improve functional strength and balance for better safety at home.    Time  4    Period  Weeks    Status  New    Target Date  12/17/17        PT Long Term Goals - 06/10/18 0949      PT LONG TERM GOAL #1   Title   Patient will be independent in bending down towards floor and picking up small object (<5 pounds) and then stand back up with pain 5/10 or less to improve ability to pick up and clean up room at home    Baseline  06/10/18 8/10 with "all motion"    Time  8    Period  Weeks    Status  Revised      PT LONG TERM GOAL #2   Title  Patient will increase BLE gross strength to 4+/5 as to improve functional strength for independent gait, increased standing tolerance and increased ADL ability.    Baseline  03/18/18 R/L flexion: 4+/4+ ER 4+/4 IR 5/5 ABD 4+/5 Ext 4-/4-    Time  8    Period  Weeks    Status  Deferred      PT LONG TERM GOAL #3   Title  Patient will report a worst pain of 5/10 with transfers of her husband to improve tolerance with ADLs     Baseline  06/10/18 8/10 pain with transferring husband    Time  8    Period  Weeks    Status  Revised      PT LONG TERM GOAL #4   Title  Pt will decrease 5TSTS by at least 3 seconds in order to demonstrate clinically significant improvement in LE strength    Baseline  06/10/18 16sec    Time  8    Period  Weeks    Status  Revised      PT LONG TERM GOAL #5    Title  Pt will increase 10MWT by at least 0.13 m/s in order to demonstrate clinically significant improvement in community ambulation  Baseline  06/10/18 0.75m/s 15sec    Time  8    Period  Weeks    Status  Revised      PT LONG TERM GOAL #7   Title  Patient will increase standing tolerance to 36min without increased pain in order to complete household chores/cooking    Baseline  06/10/18/20 reports after 87min she has 8/10 pain and is "hunched over"    Time  8    Period  Weeks    Status  Revised            Plan - 06/24/18 1009    Clinical Impression Statement  PT continued therex progression and standing tolerance, which patient is able to complete with good form following PT cuing and demo. Patient reports no increased pain throughout session, only muscle fatigue. PT will continue progression as able.     Rehab Potential  Fair    Clinical Impairments Affecting Rehab Potential  comorbidities, symptom duration, curvature degree    PT Frequency  2x / week    PT Duration  8 weeks    PT Treatment/Interventions  ADLs/Self Care Home Management;Aquatic Therapy;Cryotherapy;Ultrasound;Parrafin;Traction;Moist Heat;Electrical Stimulation;DME Instruction;Gait training;Stair training;Functional mobility training;Neuromuscular re-education;Balance training;Therapeutic exercise;Therapeutic activities;Patient/family education;Energy conservation;Spinal Manipulations;Joint Manipulations;Passive range of motion;Dry needling;Manual techniques    PT Next Visit Plan   hip flexor lengthening/glute strengthening, core control, attempt bridge    PT Home Exercise Plan  seated thoracic ext, see pt instruction section: diaphragmatic breathing in supine, supine R SB and lat stretch    Consulted and Agree with Plan of Care  Patient       Patient will benefit from skilled therapeutic intervention in order to improve the following deficits and impairments:  Abnormal gait, Decreased balance, Decreased endurance,  Decreased mobility, Difficulty walking, Hypomobility, Increased muscle spasms, Decreased range of motion, Pain, Postural dysfunction, Impaired flexibility, Increased fascial restricitons, Decreased strength, Decreased activity tolerance  Visit Diagnosis: Other idiopathic scoliosis, lumbar region     Problem List Patient Active Problem List   Diagnosis Date Noted  . Central sleep apnea 10/09/2016  . Hypertension 10/09/2016  . Lumbar postlaminectomy syndrome 10/09/2016  . Lumbar radiculopathy 10/09/2016  . Diabetic polyneuropathy ([place]) 10/07/2016  . Right lumbosacral radiculopathy 10/07/2016  . Healthcare maintenance 08/26/2016  . DKA (diabetic ketoacidoses) ([place]) 06/14/2016  . Atypical chest pain 02/23/2016  . SOB (shortness of breath) 02/23/2016  . Pain medication agreement signed 01/13/2015  . Acquired hypothyroidism 05/30/2014  . Frontal sinusitis 12/17/2013  . Atrial fibrillation ([place]) 11/29/2013  . Major depression in remission ([place]) 11/20/2013  . 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 Chronic pain syndrome 08/27/2013  . Back pain 07/17/2013  . HTN (hypertension), benign 07/17/2013  . Hyperlipidemia 07/17/2013  . Pancreatic insufficiency 07/17/2013  . Chronic postoperative pain 01/01/2013  . Long term current use of opiate analgesic 11/04/2012  . Hypothyroidism 08/25/2012  . Radiculopathy of leg 05/01/2011   Shelton Silvas PT, DPT Shelton Silvas 06/24/2018, 10:13 AM  [facility] [address], [place], [address] Phone: [phone]   Fax:  [phone]  Name: KELI BUEHNER MRN: [id number] Date of Birth: Sep 23, 1950

## 2018-06-29 ENCOUNTER — Ambulatory Visit: Payer: Medicare Other | Admitting: Physical Therapy

## 2018-07-01 ENCOUNTER — Encounter: Payer: Self-pay | Admitting: Physical Therapy

## 2018-07-01 ENCOUNTER — Other Ambulatory Visit: Payer: Self-pay

## 2018-07-01 ENCOUNTER — Ambulatory Visit: Payer: Medicare Other | Admitting: Physical Therapy

## 2018-07-01 DIAGNOSIS — M4126 Other idiopathic scoliosis, lumbar region: Secondary | ICD-10-CM | POA: Diagnosis not present

## 2018-07-01 NOTE — Therapy (Signed)
[facility] [address], [place], [address] Phone: [phone]   Fax:  [phone]  Physical Therapy Treatment  Patient Details  Name: Colleen Payne MRN: [id number] Date of Birth: 09/04/1950 Referring Provider (PT): Traci Sermon MD   Encounter Date: 07/01/2018  PT End of Session - 07/01/18 0938    Visit Number  5    Number of Visits  17    Authorization Type  Medicare    Authorization Time Period  5/10    PT Start Time  0931    PT Stop Time  1010    PT Time Calculation (min)  39 min    Activity Tolerance  Patient tolerated treatment well;Patient limited by fatigue;Patient limited by pain    Behavior During Therapy  [facility] for tasks assessed/performed       Past Medical History:  Diagnosis Date  . Anxiety   . Atrial fibrillation ([place])   . Depression   . Diabetes mellitus without complication (HCC)    Type I   . Headache    stress. 1x/month  . Hypertension   . Insulin pump in place   . Motion sickness    cars  . MVP (mitral valve prolapse)   . Neuromuscular disorder (HCC)    leg weakness s/p back surgeries  . Thyroid disease     Past Surgical History:  Procedure Laterality Date  . BACK SURGERY  2004   3 sugeries between Sept and Nov, fusion L2-S1  . CATARACT EXTRACTION W/PHACO Left 11/23/2014   Procedure: CATARACT EXTRACTION PHACO AND INTRAOCULAR LENS PLACEMENT (IOC);  Surgeon: Leandrew Koyanagi, MD;  Location: [facility];  Service: Ophthalmology;  Laterality: Left;  DIABETIC - insulin pump    There were no vitals filed for this visit.  Subjective Assessment - 07/01/18 0936    Subjective  Reports 8/10 pain today, with minimal compliance with HEP d/t caretaking for husband    Pertinent History  Patient is 68 yo female that complains of lumbar pain and inability to walk erect, as well as hip/abdominal pain. Patient with history of lumbar fusion (L2-S1). Xray results show:  Fairly stable appearance of the levoscoliosis of the lower thoracic and of the lumbar spine.Stable lateral subluxation toward the left of L2 with respect L3. Stable partial compression of the body of L1. Patient complains of frustration with physicians, and that her symptoms are worsening over time. 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 Reported that her pain and difficulty worsened after trying to pick her husband up about 1 year ago. Patient mentioned that she has been told that she has "spinal fluid pockets that are leaking". PMH also includes DM I with insulin pump,  afib, HTN,  HLD, chronic pain syndrome, SOB.    Limitations  Standing;Walking;House hold activities;Other (comment)    Diagnostic tests  see exray results: Fairly stable appearance of the levoscoliosis of the lower thoracic and lumbar spine. Stable lateral subluxation toward left of L2 with respect of L3, stable partial compression of the body of L1.    Patient Stated Goals  pain relief, standing erect    Pain Onset  More than a month ago    Pain Onset  More than a month ago         Ther-Ex -Modified thomas stretchwith PT overpressure 50minbilat - Bridge 3x 10 with GTB at knees for abd activation clearing sacrum from table, cuing for  full ext and ROM - Prone alt supermans 3x 10 each side with cuing for eccentric lowering with good carry over - Prone prop with breath control increasing low back/thoracic arch each exhale x10 with good carry over following initial demo - Mini squat with mat table TC of ROM 3x 10 with demo and max cuing on first set for proper form with glute activation, good carry over following, some TC needed throughout; CGA for safety - Backward walking HHA over 56ft x3 with cuing throughout for erect posture with decent carry over                       PT Education - 07/01/18 0937    Education Details  exercise form    Methods   Explanation;Demonstration;Tactile cues;Verbal cues    Comprehension  Verbalized understanding;Returned demonstration;Verbal cues required;Tactile cues required       PT Short Term Goals - 06/10/18 0949      PT SHORT TERM GOAL #1   Title  Patient will be adherent to initial HEP at least 3x a week to improve functional strength and balance for better safety at home.    Time  4    Period  Weeks    Status  New    Target Date  12/17/17        PT Long Term Goals - 06/10/18 0949      PT LONG TERM GOAL #1   Title   Patient will be independent in bending down towards floor and picking up small object (<5 pounds) and then stand back up with pain 5/10 or less to improve ability to pick up and clean up room at home    Baseline  06/10/18 8/10 with "all motion"    Time  8    Period  Weeks    Status  Revised      PT LONG TERM GOAL #2   Title  Patient will increase BLE gross strength to 4+/5 as to improve functional strength for independent gait, increased standing tolerance and increased ADL ability.    Baseline  03/18/18 R/L flexion: 4+/4+ ER 4+/4 IR 5/5 ABD 4+/5 Ext 4-/4-    Time  8    Period  Weeks    Status  Deferred      PT LONG TERM GOAL #3   Title  Patient will report a worst pain of 5/10 with transfers of her husband to improve tolerance with ADLs     Baseline  06/10/18 8/10 pain with transferring husband    Time  8    Period  Weeks    Status  Revised      PT LONG TERM GOAL #4   Title  Pt will decrease 5TSTS by at least 3 seconds in order to demonstrate clinically significant improvement in LE strength    Baseline  06/10/18 16sec    Time  8    Period  Weeks    Status  Revised      PT LONG TERM GOAL #5   Title  Pt will increase 10MWT by at least 0.13 m/s in order to demonstrate clinically significant improvement in community ambulation    Baseline  06/10/18 0.22m/s 15sec    Time  8    Period  Weeks    Status  Revised      PT LONG TERM GOAL #7   Title  Patient will increase  standing tolerance to 58min without increased pain in order to complete  household chores/cooking    Baseline  06/10/18/20 reports after 56min she has 8/10 pain and is "hunched over"    Time  8    Period  Weeks    Status  Revised            Plan - 07/01/18 1128    Clinical Impression Statement  PT continued therex progression within patient tolerance. Patient reports no pain throughout therex, only noted muscle fatigue. Patient is demonstrating good carry over of form with therex between sessions, but does require cuing 100% accurate form. PT will continue progression as able.     Rehab Potential  Fair    Clinical Impairments Affecting Rehab Potential  comorbidities, symptom duration, curvature degree    PT Frequency  2x / week    PT Duration  8 weeks    PT Treatment/Interventions  ADLs/Self Care Home Management;Aquatic Therapy;Cryotherapy;Ultrasound;Parrafin;Traction;Moist Heat;Electrical Stimulation;DME Instruction;Gait training;Stair training;Functional mobility training;Neuromuscular re-education;Balance training;Therapeutic exercise;Therapeutic activities;Patient/family education;Energy conservation;Spinal Manipulations;Joint Manipulations;Passive range of motion;Dry needling;Manual techniques    PT Next Visit Plan   hip flexor lengthening/glute strengthening, core control, attempt bridge    PT Home Exercise Plan  seated thoracic ext, see pt instruction section: diaphragmatic breathing in supine, supine R SB and lat stretch    Consulted and Agree with Plan of Care  Patient       Patient will benefit from skilled therapeutic intervention in order to improve the following deficits and impairments:  Abnormal gait, Decreased balance, Decreased endurance, Decreased mobility, Difficulty walking, Hypomobility, Increased muscle spasms, Decreased range of motion, Pain, Postural dysfunction, Impaired flexibility, Increased fascial restricitons, Decreased strength, Decreased activity  tolerance  Visit Diagnosis: Other idiopathic scoliosis, lumbar region     Problem List Patient Active Problem List   Diagnosis Date Noted  . Central sleep apnea 10/09/2016  . Hypertension 10/09/2016  . Lumbar postlaminectomy syndrome 10/09/2016  . Lumbar radiculopathy 10/09/2016  . Diabetic polyneuropathy ([place]) 10/07/2016  . Right lumbosacral radiculopathy 10/07/2016  . Healthcare maintenance 08/26/2016  . DKA (diabetic ketoacidoses) ([place]) 06/14/2016  . Atypical chest pain 02/23/2016  . SOB (shortness of breath) 02/23/2016  . Pain medication agreement signed 01/13/2015  . Acquired hypothyroidism 05/30/2014  . Frontal sinusitis 12/17/2013  . Atrial fibrillation ([place]) 11/29/2013  . Major depression in remission ([place]) 11/20/2013  . 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 Chronic pain syndrome 08/27/2013  . Back pain 07/17/2013  . HTN (hypertension), benign 07/17/2013  . Hyperlipidemia 07/17/2013  . Pancreatic insufficiency 07/17/2013  . Chronic postoperative pain 01/01/2013  . Long term current use of opiate analgesic 11/04/2012  . Hypothyroidism 08/25/2012  . Radiculopathy of leg 05/01/2011   Shelton Silvas PT, DPT Shelton Silvas 07/01/2018, 11:33 AM  [facility] [address], [place], [address] Phone: [phone]   Fax:  [phone]  Name: Colleen Payne MRN: [id number] Date of Birth: 1951/01/08

## 2018-07-06 ENCOUNTER — Encounter: Payer: Self-pay | Admitting: Physical Therapy

## 2018-07-06 ENCOUNTER — Ambulatory Visit: Payer: Medicare Other | Admitting: Physical Therapy

## 2018-07-06 ENCOUNTER — Other Ambulatory Visit: Payer: Self-pay

## 2018-07-06 DIAGNOSIS — M5416 Radiculopathy, lumbar region: Secondary | ICD-10-CM

## 2018-07-06 DIAGNOSIS — M4126 Other idiopathic scoliosis, lumbar region: Secondary | ICD-10-CM | POA: Diagnosis not present

## 2018-07-06 NOTE — Therapy (Signed)
[facility] [address] [place], [address] Phone: [phone]   Fax:  [phone]  Physical Therapy Treatment  Patient Details  Name: Colleen Payne MRN: [id number] Date of Birth: February 13, 1950 Referring Provider (PT): Traci Sermon MD   Encounter Date: 07/06/2018  PT End of Session - 07/06/18 0949    Visit Number  6    Number of Visits  17    Date for PT Re-Evaluation  08/05/18    PT Start Time  0939    PT Stop Time  1020    PT Time Calculation (min)  41 min    Activity Tolerance  Patient tolerated treatment well;Patient limited by fatigue;Patient limited by pain    Behavior During Therapy  [facility] for tasks assessed/performed       Past Medical History:  Diagnosis Date  . Anxiety   . Atrial fibrillation ([place])   . Depression   . Diabetes mellitus without complication (HCC)    Type I   . Headache    stress. 1x/month  . Hypertension   . Insulin pump in place   . Motion sickness    cars  . MVP (mitral valve prolapse)   . Neuromuscular disorder (HCC)    leg weakness s/p back surgeries  . Thyroid disease     Past Surgical History:  Procedure Laterality Date  . BACK SURGERY  2004   3 sugeries between Sept and Nov, fusion L2-S1  . CATARACT EXTRACTION W/PHACO Left 11/23/2014   Procedure: CATARACT EXTRACTION PHACO AND INTRAOCULAR LENS PLACEMENT (IOC);  Surgeon: Leandrew Koyanagi, MD;  Location: [facility];  Service: Ophthalmology;  Laterality: Left;  DIABETIC - insulin pump    There were no vitals filed for this visit.  Subjective Assessment - 07/06/18 0943    Subjective  Reports 6/10 pain this am. Pt reports that she has been sleeping well, but in the recliner    Pertinent History  Patient is 68 yo female that complains of lumbar pain and inability to walk erect, as well as hip/abdominal pain. Patient with history of lumbar fusion (L2-S1). Xray results show: Fairly stable appearance  of the levoscoliosis of the lower thoracic and of the lumbar spine.Stable lateral subluxation toward the left of L2 with respect L3. Stable partial compression of the body of L1. Patient complains of frustration with physicians, and that her symptoms are worsening over time. 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 Reported that her pain and difficulty worsened after trying to pick her husband up about 1 year ago. Patient mentioned that she has been told that she has "spinal fluid pockets that are leaking". PMH also includes DM I with insulin pump,  afib, HTN,  HLD, chronic pain syndrome, SOB.    Limitations  Standing;Walking;House hold activities;Other (comment)    Diagnostic tests  see exray results: Fairly stable appearance of the levoscoliosis of the lower thoracic and lumbar spine. Stable lateral subluxation toward left of L2 with respect of L3, stable partial compression of the body of L1.    Patient Stated Goals  pain relief, standing erect         Ther-Ex -Modified thomas stretchwith PT overpressure 25minbilat - Bridge 3x 10 with feet on bosu ball and cuing for full ROM and eccentric lowering - Prone press with scapular retraction 3x 10/8/8 with min cuing initially for thoracic ext with good carry over - Prone alt supermans 3x  10 each side with better ROM this session - Modified good morning with mat table at post thigh for stabilization 2x 10 with demo and heavy cuing initially for proper form and maintained scap retraction with good carry over ; added 5# DB in each hand with demo and max cuing initially to continue proper form with resistance, good carry over  - Mini squat with mat table TC of ROM 3x 10 lowered mat table from previous session, intermittent TC for proper form with good carry over                        PT Education - 07/06/18 0948    Education Details  Therex form    Person(s) Educated  Patient     Methods  Explanation;Demonstration;Verbal cues    Comprehension  Verbalized understanding;Returned demonstration;Verbal cues required       PT Short Term Goals - 06/10/18 0949      PT SHORT TERM GOAL #1   Title  Patient will be adherent to initial HEP at least 3x a week to improve functional strength and balance for better safety at home.    Time  4    Period  Weeks    Status  New    Target Date  12/17/17        PT Long Term Goals - 06/10/18 0949      PT LONG TERM GOAL #1   Title   Patient will be independent in bending down towards floor and picking up small object (<5 pounds) and then stand back up with pain 5/10 or less to improve ability to pick up and clean up room at home    Baseline  06/10/18 8/10 with "all motion"    Time  8    Period  Weeks    Status  Revised      PT LONG TERM GOAL #2   Title  Patient will increase BLE gross strength to 4+/5 as to improve functional strength for independent gait, increased standing tolerance and increased ADL ability.    Baseline  03/18/18 R/L flexion: 4+/4+ ER 4+/4 IR 5/5 ABD 4+/5 Ext 4-/4-    Time  8    Period  Weeks    Status  Deferred      PT LONG TERM GOAL #3   Title  Patient will report a worst pain of 5/10 with transfers of her husband to improve tolerance with ADLs     Baseline  06/10/18 8/10 pain with transferring husband    Time  8    Period  Weeks    Status  Revised      PT LONG TERM GOAL #4   Title  Pt will decrease 5TSTS by at least 3 seconds in order to demonstrate clinically significant improvement in LE strength    Baseline  06/10/18 16sec    Time  8    Period  Weeks    Status  Revised      PT LONG TERM GOAL #5   Title  Pt will increase 10MWT by at least 0.13 m/s in order to demonstrate clinically significant improvement in community ambulation    Baseline  06/10/18 0.85m/s 15sec    Time  8    Period  Weeks    Status  Revised      PT LONG TERM GOAL #7   Title  Patient will increase standing tolerance to 66min  without increased pain in order to complete household  chores/cooking    Baseline  06/10/18/20 reports after 48min she has 8/10 pain and is "hunched over"    Time  8    Period  Weeks    Status  Revised            Plan - 07/06/18 1236    Clinical Impression Statement  PT continued therex progression for extensor posture. PT progressed functional movement therex, which patient is able to complete with accuracy following PT cuing, with increased muscle fatigue, rest needed between sets.Pt is encouraged by progress. PT will continue progression as able.    PT Frequency  2x / week    PT Duration  8 weeks    PT Treatment/Interventions  ADLs/Self Care Home Management;Aquatic Therapy;Cryotherapy;Ultrasound;Parrafin;Traction;Moist Heat;Electrical Stimulation;DME Instruction;Gait training;Stair training;Functional mobility training;Neuromuscular re-education;Balance training;Therapeutic exercise;Therapeutic activities;Patient/family education;Energy conservation;Spinal Manipulations;Joint Manipulations;Passive range of motion;Dry needling;Manual techniques    PT Next Visit Plan   hip flexor lengthening/glute strengthening, core control, attempt bridge    PT Home Exercise Plan  seated thoracic ext, see pt instruction section: diaphragmatic breathing in supine, supine R SB and lat stretch    Consulted and Agree with Plan of Care  Patient       Patient will benefit from skilled therapeutic intervention in order to improve the following deficits and impairments:  Abnormal gait, Decreased balance, Decreased endurance, Decreased mobility, Difficulty walking, Hypomobility, Increased muscle spasms, Decreased range of motion, Pain, Postural dysfunction, Impaired flexibility, Increased fascial restricitons, Decreased strength, Decreased activity tolerance  Visit Diagnosis:     Problem List Patient Active Problem List   Diagnosis Date Noted  . Central sleep apnea 10/09/2016  . Hypertension 10/09/2016  .  Lumbar postlaminectomy syndrome 10/09/2016  . Lumbar radiculopathy 10/09/2016  . Diabetic polyneuropathy ([place]) 10/07/2016  . Right lumbosacral radiculopathy 10/07/2016  . Healthcare maintenance 08/26/2016  . DKA (diabetic ketoacidoses) ([place]) 06/14/2016  . Atypical chest pain 02/23/2016  . SOB (shortness of breath) 02/23/2016  . Pain medication agreement signed 01/13/2015  . Acquired hypothyroidism 05/30/2014  . Frontal sinusitis 12/17/2013  . Atrial fibrillation ([place]) 11/29/2013  . Major depression in remission ([place]) 11/20/2013  . 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 Chronic pain syndrome 08/27/2013  . Back pain 07/17/2013  . HTN (hypertension), benign 07/17/2013  . Hyperlipidemia 07/17/2013  . Pancreatic insufficiency 07/17/2013  . Chronic postoperative pain 01/01/2013  . Long term current use of opiate analgesic 11/04/2012  . Hypothyroidism 08/25/2012  . Radiculopathy of leg 05/01/2011   Shelton Silvas PT, DPT Shelton Silvas 07/06/2018, 12:39 PM  [facility] [address] [place], [address] Phone: [phone]   Fax:  [phone]  Name: Colleen Payne MRN: [id number] Date of Birth: 04-Dec-1950

## 2018-07-08 ENCOUNTER — Encounter: Payer: Self-pay | Admitting: Physical Therapy

## 2018-07-08 ENCOUNTER — Other Ambulatory Visit: Payer: Self-pay

## 2018-07-08 ENCOUNTER — Ambulatory Visit: Payer: Medicare Other | Admitting: Physical Therapy

## 2018-07-08 DIAGNOSIS — M4126 Other idiopathic scoliosis, lumbar region: Secondary | ICD-10-CM | POA: Diagnosis not present

## 2018-07-08 DIAGNOSIS — M5416 Radiculopathy, lumbar region: Secondary | ICD-10-CM

## 2018-07-08 NOTE — Therapy (Signed)
[facility] [address] [place], [address] Phone: [phone]   Fax:  [phone]  Physical Therapy Treatment  Patient Details  Name: Colleen Payne MRN: [id number] Date of Birth: [birth year] Referring Provider (PT): Traci Sermon MD   Encounter Date: 07/08/2018  PT End of Session - 07/08/18 0941    Visit Number  7    Number of Visits  17    Date for PT Re-Evaluation  08/05/18    PT Start Time  0930    PT Stop Time  1015    PT Time Calculation (min)  45 min    Activity Tolerance  Patient tolerated treatment well;Patient limited by fatigue;Patient limited by pain    Behavior During Therapy  [facility] for tasks assessed/performed       Past Medical History:  Diagnosis Date  . Anxiety   . Atrial fibrillation ([place])   . Depression   . Diabetes mellitus without complication (HCC)    Type I   . Headache    stress. 1x/month  . Hypertension   . Insulin pump in place   . Motion sickness    cars  . MVP (mitral valve prolapse)   . Neuromuscular disorder (HCC)    leg weakness s/p back surgeries  . Thyroid disease     Past Surgical History:  Procedure Laterality Date  . BACK SURGERY  2004   3 sugeries between Sept and Nov, fusion L2-S1  . CATARACT EXTRACTION W/PHACO Left 11/23/2014   Procedure: CATARACT EXTRACTION PHACO AND INTRAOCULAR LENS PLACEMENT (IOC);  Surgeon: Leandrew Koyanagi, MD;  Location: [place];  Service: Ophthalmology;  Laterality: Left;  DIABETIC - insulin pump    There were no vitals filed for this visit.  Subjective Assessment - 07/08/18 0934    Subjective  Patient reports she was sore yesterday following Tuesday's session, but it has mostly subsided today. Reports 8/10 pain today    Pertinent History  Patient is 68 yo female that complains of lumbar pain and inability to walk erect, as well as hip/abdominal pain. Patient with history of lumbar fusion (L2-S1). Xray  results show: Fairly stable appearance of the levoscoliosis of the lower thoracic and of the lumbar spine.Stable lateral subluxation toward the left of L2 with respect L3. Stable partial compression of the body of L1. Patient complains of frustration with physicians, and that her symptoms are worsening over time. 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 Reported that her pain and difficulty worsened after trying to pick her husband up about 1 year ago. Patient mentioned that she has been told that she has "spinal fluid pockets that are leaking". PMH also includes DM I with insulin pump,  afib, HTN,  HLD, chronic pain syndrome, SOB.    Limitations  Standing;Walking;House hold activities;Other (comment)    Diagnostic tests  see exray results: Fairly stable appearance of the levoscoliosis of the lower thoracic and lumbar spine. Stable lateral subluxation toward left of L2 with respect of L3, stable partial compression of the body of L1.    Patient Stated Goals  pain relief, standing erect    Pain Onset  More than a month ago    Pain Onset  More than a month ago      Ther-Ex -bilat modified thomas stretch 70min progression from reclined position - Prone press with scapular retraction 3x 10 with min cuing initially for thoracic ext with good  carry over - Prone alt supermans 3x10each side with better ROM this session - Bird dog 2x 6 LE only with cuing to increase hip ext, with patient able to complete  - Kneeling with theraball T 3x 8 with patient demonstrating better ability to hold herself up, cuing needed for proper scapular retraction with good carry over - Kneeling hip thrust 2x 10 with TC initially for proper form with good carry over following - Seated thoracic ext stretch 2x 30sec hold                          PT Education - 07/08/18 0941    Education Details  therex form    Person(s) Educated  Patient    Methods   Explanation;Demonstration;Tactile cues;Verbal cues    Comprehension  Verbalized understanding;Returned demonstration;Verbal cues required;Tactile cues required       PT Short Term Goals - 06/10/18 0949      PT SHORT TERM GOAL #1   Title  Patient will be adherent to initial HEP at least 3x a week to improve functional strength and balance for better safety at home.    Time  4    Period  Weeks    Status  New    Target Date  12/17/17        PT Long Term Goals - 06/10/18 0949      PT LONG TERM GOAL #1   Title   Patient will be independent in bending down towards floor and picking up small object (<5 pounds) and then stand back up with pain 5/10 or less to improve ability to pick up and clean up room at home    Baseline  06/10/18 8/10 with "all motion"    Time  8    Period  Weeks    Status  Revised      PT LONG TERM GOAL #2   Title  Patient will increase BLE gross strength to 4+/5 as to improve functional strength for independent gait, increased standing tolerance and increased ADL ability.    Baseline  03/18/18 R/L flexion: 4+/4+ ER 4+/4 IR 5/5 ABD 4+/5 Ext 4-/4-    Time  8    Period  Weeks    Status  Deferred      PT LONG TERM GOAL #3   Title  Patient will report a worst pain of 5/10 with transfers of her husband to improve tolerance with ADLs     Baseline  06/10/18 8/10 pain with transferring husband    Time  8    Period  Weeks    Status  Revised      PT LONG TERM GOAL #4   Title  Pt will decrease 5TSTS by at least 3 seconds in order to demonstrate clinically significant improvement in LE strength    Baseline  06/10/18 16sec    Time  8    Period  Weeks    Status  Revised      PT LONG TERM GOAL #5   Title  Pt will increase 10MWT by at least 0.13 m/s in order to demonstrate clinically significant improvement in community ambulation    Baseline  06/10/18 0.44m/s 15sec    Time  8    Period  Weeks    Status  Revised      PT LONG TERM GOAL #7   Title  Patient will increase  standing tolerance to 30min without increased pain in order to complete household  chores/cooking    Baseline  06/10/18/20 reports after 58min she has 8/10 pain and is "hunched over"    Time  8    Period  Weeks    Status  Revised            Plan - 07/08/18 1021    Clinical Impression Statement  PT continued therex progression for extensor muscles with focus on gravity dependence. Patient is able to complete all therex with accuracy following PT cuing, with no increased pain, only muscle fatigue. Pt reports no pain following sesison. PT will continue progression as able.    Rehab Potential  Fair    Clinical Impairments Affecting Rehab Potential  comorbidities, symptom duration, curvature degree    PT Frequency  2x / week    PT Duration  8 weeks    PT Treatment/Interventions  ADLs/Self Care Home Management;Aquatic Therapy;Cryotherapy;Ultrasound;Parrafin;Traction;Moist Heat;Electrical Stimulation;DME Instruction;Gait training;Stair training;Functional mobility training;Neuromuscular re-education;Balance training;Therapeutic exercise;Therapeutic activities;Patient/family education;Energy conservation;Spinal Manipulations;Joint Manipulations;Passive range of motion;Dry needling;Manual techniques    PT Next Visit Plan   hip flexor lengthening/glute strengthening, core control, attempt bridge    PT Home Exercise Plan  seated thoracic ext, see pt instruction section: diaphragmatic breathing in supine, supine R SB and lat stretch    Consulted and Agree with Plan of Care  Patient       Patient will benefit from skilled therapeutic intervention in order to improve the following deficits and impairments:  Abnormal gait, Decreased balance, Decreased endurance, Decreased mobility, Difficulty walking, Hypomobility, Increased muscle spasms, Decreased range of motion, Pain, Postural dysfunction, Impaired flexibility, Increased fascial restricitons, Decreased strength, Decreased activity tolerance  Visit  Diagnosis: 1. Other idiopathic scoliosis, lumbar region   2. Radiculopathy, lumbar region        Problem List Patient Active Problem List   Diagnosis Date Noted  . Central sleep apnea 10/09/2016  . Hypertension 10/09/2016  . Lumbar postlaminectomy syndrome 10/09/2016  . Lumbar radiculopathy 10/09/2016  . Diabetic polyneuropathy ([place]) 10/07/2016  . Right lumbosacral radiculopathy 10/07/2016  . Healthcare maintenance 08/26/2016  . DKA (diabetic ketoacidoses) ([place]) 06/14/2016  . Atypical chest pain 02/23/2016  . SOB (shortness of breath) 02/23/2016  . Pain medication agreement signed 01/13/2015  . Acquired hypothyroidism 05/30/2014  . Frontal sinusitis 12/17/2013  . Atrial fibrillation ([place]) 11/29/2013  . Major depression in remission ([place]) 11/20/2013  . 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 Chronic pain syndrome 08/27/2013  . Back pain 07/17/2013  . HTN (hypertension), benign 07/17/2013  . Hyperlipidemia 07/17/2013  . Pancreatic insufficiency 07/17/2013  . Chronic postoperative pain 01/01/2013  . Long term current use of opiate analgesic 11/04/2012  . Hypothyroidism 08/25/2012  . Radiculopathy of leg 05/01/2011   Shelton Silvas PT, DPT Shelton Silvas 07/08/2018, 11:46 AM  [facility] [address] [place], [address] Phone: [phone]   Fax:  [phone]  Name: BULA CAVALIERI MRN: [id number] Date of Birth: May 17, 1950

## 2018-07-13 ENCOUNTER — Other Ambulatory Visit: Payer: Self-pay

## 2018-07-13 ENCOUNTER — Encounter: Payer: Self-pay | Admitting: Physical Therapy

## 2018-07-13 ENCOUNTER — Ambulatory Visit: Payer: Medicare Other | Admitting: Physical Therapy

## 2018-07-13 DIAGNOSIS — M5416 Radiculopathy, lumbar region: Secondary | ICD-10-CM

## 2018-07-13 DIAGNOSIS — M4126 Other idiopathic scoliosis, lumbar region: Secondary | ICD-10-CM

## 2018-07-13 NOTE — Therapy (Addendum)
[facility] [address], [place], [address] Phone: [phone]   Fax:  [phone]  Physical Therapy Treatment  Patient Details  Name: Colleen Payne MRN: [id number] Date of Birth: July 07, 1950 Referring Provider (PT): Traci Sermon MD   Encounter Date: 07/13/2018  PT End of Session - 07/13/18 0942    Visit Number  8    Number of Visits  17    Authorization Type  Medicare    Authorization Time Period  6/10    PT Start Time  0933    PT Stop Time  1014    PT Time Calculation (min)  41 min    Activity Tolerance  Patient tolerated treatment well    Behavior During Therapy  [facility] for tasks assessed/performed       Past Medical History:  Diagnosis Date  . Anxiety   . Atrial fibrillation ([place])   . Depression   . Diabetes mellitus without complication (HCC)    Type I   . Headache    stress. 1x/month  . Hypertension   . Insulin pump in place   . Motion sickness    cars  . MVP (mitral valve prolapse)   . Neuromuscular disorder (HCC)    leg weakness s/p back surgeries  . Thyroid disease     Past Surgical History:  Procedure Laterality Date  . BACK SURGERY  2004   3 sugeries between Sept and Nov, fusion L2-S1  . CATARACT EXTRACTION W/PHACO Left 11/23/2014   Procedure: CATARACT EXTRACTION PHACO AND INTRAOCULAR LENS PLACEMENT (IOC);  Surgeon: Leandrew Koyanagi, MD;  Location: [place];  Service: Ophthalmology;  Laterality: Left;  DIABETIC - insulin pump    There were no vitals filed for this visit.  Subjective Assessment - 07/13/18 0933    Subjective  Reports 6/10 pain to date. Reports she did a lot on her feet this weekend and that "she held herself up fairly well this weekend".    Pertinent History  Patient is 68 yo female that complains of lumbar pain and inability to walk erect, as well as hip/abdominal pain. Patient with history of lumbar fusion (L2-S1). Xray results show: Fairly  stable appearance of the levoscoliosis of the lower thoracic and of the lumbar spine.Stable lateral subluxation toward the left of L2 with respect L3. Stable partial compression of the body of L1. Patient complains of frustration with physicians, and that her symptoms are worsening over time. 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 Reported that her pain and difficulty worsened after trying to pick her husband up about 1 year ago. Patient mentioned that she has been told that she has "spinal fluid pockets that are leaking". PMH also includes DM I with insulin pump,  afib, HTN,  HLD, chronic pain syndrome, SOB.    Limitations  Standing;Walking;House hold activities;Other (comment)    Diagnostic tests  see exray results: Fairly stable appearance of the levoscoliosis of the lower thoracic and lumbar spine. Stable lateral subluxation toward left of L2 with respect of L3, stable partial compression of the body of L1.    Patient Stated Goals  pain relief, standing erect    Pain Onset  More than a month ago       Ther-Ex -bilat modified thomas stretch 43min progression from reclined position - [employer] thoracic ext with scap retraction 3x 8 with PT stabilization at post LE with cuing for full available  ROM - Prone press with scapular retraction 3x 10/`0/8 with cuing for set up with good carry over of proper form from previous sessions. Pain in lateral deltoid so discontinued for third set - Bird dog 3x 10 LE only with cuing to increase hip ext, with patient able to complete with good carry over - Good morning 3x 8 with demo and cuing for set up with good carry over following; Mat table at knees for TC, UE in front for increased spine elongation cuing wit good carry over - Kneeling hip thrust x10 with cuing for set up good carry over following. 2x 10 with black TB resistance - Seated thoracic ext stretch 2x 30sec  hold                         PT Education - 07/13/18 0941    Education Details  therex form    Person(s) Educated  Patient    Methods  Explanation;Demonstration;Tactile cues;Verbal cues    Comprehension  Verbalized understanding;Returned demonstration;Tactile cues required;Verbal cues required       PT Short Term Goals - 06/10/18 0949      PT SHORT TERM GOAL #1   Title  Patient will be adherent to initial HEP at least 3x a week to improve functional strength and balance for better safety at home.    Time  4    Period  Weeks    Status  New    Target Date  12/17/17        PT Long Term Goals - 06/10/18 0949      PT LONG TERM GOAL #1   Title   Patient will be independent in bending down towards floor and picking up small object (<5 pounds) and then stand back up with pain 5/10 or less to improve ability to pick up and clean up room at home    Baseline  06/10/18 8/10 with "all motion"    Time  8    Period  Weeks    Status  Revised      PT LONG TERM GOAL #2   Title  Patient will increase BLE gross strength to 4+/5 as to improve functional strength for independent gait, increased standing tolerance and increased ADL ability.    Baseline  03/18/18 R/L flexion: 4+/4+ ER 4+/4 IR 5/5 ABD 4+/5 Ext 4-/4-    Time  8    Period  Weeks    Status  Deferred      PT LONG TERM GOAL #3   Title  Patient will report a worst pain of 5/10 with transfers of her husband to improve tolerance with ADLs     Baseline  06/10/18 8/10 pain with transferring husband    Time  8    Period  Weeks    Status  Revised      PT LONG TERM GOAL #4   Title  Pt will decrease 5TSTS by at least 3 seconds in order to demonstrate clinically significant improvement in LE strength    Baseline  06/10/18 16sec    Time  8    Period  Weeks    Status  Revised      PT LONG TERM GOAL #5   Title  Pt will increase 10MWT by at least 0.13 m/s in order to demonstrate clinically significant improvement in  community ambulation    Baseline  06/10/18 0.23m/s 15sec    Time  8    Period  Weeks  Status  Revised      PT LONG TERM GOAL #7   Title  Patient will increase standing tolerance to 51min without increased pain in order to complete household chores/cooking    Baseline  06/10/18/20 reports after 49min she has 8/10 pain and is "hunched over"    Time  8    Period  Weeks    Status  Revised            Plan - 07/13/18 1247    Clinical Impression Statement  PT continued therex progression, which patient is able to complete with success following demo and cuing. Patietn reports only 4/10 pain following interventions. PT added walking with upright posture to HEP which patient verbalized understanding of.    Stability/Clinical Decision Making  Evolving/Moderate complexity    Clinical Decision Making  Moderate    Rehab Potential  Fair    Clinical Impairments Affecting Rehab Potential  comorbidities, symptom duration, curvature degree    PT Frequency  2x / week    PT Duration  8 weeks    PT Treatment/Interventions  ADLs/Self Care Home Management;Aquatic Therapy;Cryotherapy;Ultrasound;Parrafin;Traction;Moist Heat;Electrical Stimulation;DME Instruction;Gait training;Stair training;Functional mobility training;Neuromuscular re-education;Balance training;Therapeutic exercise;Therapeutic activities;Patient/family education;Energy conservation;Spinal Manipulations;Joint Manipulations;Passive range of motion;Dry needling;Manual techniques    PT Next Visit Plan   hip flexor lengthening/glute strengthening, core control, attempt bridge    PT Home Exercise Plan  seated thoracic ext, see pt instruction section: diaphragmatic breathing in supine, supine R SB and lat stretch    Consulted and Agree with Plan of Care  Patient       Patient will benefit from skilled therapeutic intervention in order to improve the following deficits and impairments:  Abnormal gait, Decreased balance, Decreased endurance,  Decreased mobility, Difficulty walking, Hypomobility, Increased muscle spasms, Decreased range of motion, Pain, Postural dysfunction, Impaired flexibility, Increased fascial restricitons, Decreased strength, Decreased activity tolerance  Visit Diagnosis: 1. Other idiopathic scoliosis, lumbar region   2. Radiculopathy, lumbar region        Problem List Patient Active Problem List   Diagnosis Date Noted  . Central sleep apnea 10/09/2016  . Hypertension 10/09/2016  . Lumbar postlaminectomy syndrome 10/09/2016  . Lumbar radiculopathy 10/09/2016  . Diabetic polyneuropathy ([place]) 10/07/2016  . Right lumbosacral radiculopathy 10/07/2016  . Healthcare maintenance 08/26/2016  . DKA (diabetic ketoacidoses) ([place]) 06/14/2016  . Atypical chest pain 02/23/2016  . SOB (shortness of breath) 02/23/2016  . Pain medication agreement signed 01/13/2015  . Acquired hypothyroidism 05/30/2014  . Frontal sinusitis 12/17/2013  . Atrial fibrillation ([place]) 11/29/2013  . Major depression in remission ([place]) 11/20/2013  . 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 Chronic pain syndrome 08/27/2013  . Back pain 07/17/2013  . HTN (hypertension), benign 07/17/2013  . Hyperlipidemia 07/17/2013  . Pancreatic insufficiency 07/17/2013  . Chronic postoperative pain 01/01/2013  . Long term current use of opiate analgesic 11/04/2012  . Hypothyroidism 08/25/2012  . Radiculopathy of leg 05/01/2011   Shelton Silvas PT, DPT Shelton Silvas 07/13/2018, 12:50 PM  [facility] [address] [place], [address] Phone: [phone]   Fax:  [phone]  Name: Colleen Payne MRN: [id number] Date of Birth: 05-04-1950

## 2018-07-15 ENCOUNTER — Other Ambulatory Visit: Payer: Self-pay

## 2018-07-15 ENCOUNTER — Ambulatory Visit: Payer: Medicare Other | Admitting: Physical Therapy

## 2018-07-15 ENCOUNTER — Encounter: Payer: Self-pay | Admitting: Physical Therapy

## 2018-07-15 DIAGNOSIS — M4126 Other idiopathic scoliosis, lumbar region: Secondary | ICD-10-CM

## 2018-07-15 DIAGNOSIS — M5416 Radiculopathy, lumbar region: Secondary | ICD-10-CM
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
## 2018-07-20 ENCOUNTER — Ambulatory Visit: Payer: Medicare Other | Admitting: Physical Therapy

## 2018-07-20 ENCOUNTER — Other Ambulatory Visit: Payer: Self-pay

## 2018-07-20 ENCOUNTER — Encounter: Payer: Self-pay | Admitting: Physical Therapy

## 2018-07-20 DIAGNOSIS — M4126 Other idiopathic scoliosis, lumbar region: Secondary | ICD-10-CM

## 2018-07-20 DIAGNOSIS — M5416 Radiculopathy, lumbar region: Secondary | ICD-10-CM

## 2018-07-20 NOTE — Therapy (Addendum)
[facility] [address], [place], [address] Phone: [phone]   Fax:  [phone]  Physical Therapy Treatment/Progress Note Reporting Period: 06/10/18 - 07/20/18  Patient Details  Name: Colleen Payne MRN: [id number] Date of Birth: [birth year] Referring Provider (PT): Traci Sermon MD   Encounter Date: 07/20/2018  PT End of Session - 07/20/18 0947    Visit Number  10    Number of Visits  17    Date for PT Re-Evaluation  08/05/18    Authorization Time Period  8/10    PT Start Time  0939    PT Stop Time  1017    PT Time Calculation (min)  38 min    Activity Tolerance  Patient tolerated treatment well    Behavior During Therapy  [facility] for tasks assessed/performed       Past Medical History:  Diagnosis Date  . Anxiety   . Atrial fibrillation ([place])   . Depression   . Diabetes mellitus without complication (HCC)    Type I   . Headache    stress. 1x/month  . Hypertension   . Insulin pump in place   . Motion sickness    cars  . MVP (mitral valve prolapse)   . Neuromuscular disorder (HCC)    leg weakness s/p back surgeries  . Thyroid disease     Past Surgical History:  Procedure Laterality Date  . BACK SURGERY  2004   3 sugeries between Sept and Nov, fusion L2-S1  . CATARACT EXTRACTION W/PHACO Left 11/23/2014   Procedure: CATARACT EXTRACTION PHACO AND INTRAOCULAR LENS PLACEMENT (IOC);  Surgeon: Leandrew Koyanagi, MD;  Location: [facility];  Service: Ophthalmology;  Laterality: Left;  DIABETIC - insulin pump    There were no vitals filed for this visit.  Subjective Assessment - 07/20/18 0942    Subjective  Continues to report 8/10 pain. Reports min compliance with HEP.    Pertinent History  Patient is 68 yo female that complains of lumbar pain and inability to walk erect, as well as hip/abdominal pain. Patient with history of lumbar fusion (L2-S1). Xray results show: Fairly stable  appearance of the levoscoliosis of the lower thoracic and of the lumbar spine.Stable lateral subluxation toward the left of L2 with respect L3. Stable partial compression of the body of L1. Patient complains of frustration with physicians, and that her symptoms are worsening over time. 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 Reported that her pain and difficulty worsened after trying to pick her husband up about 1 year ago. Patient mentioned that she has been told that she has "spinal fluid pockets that are leaking". PMH also includes DM I with insulin pump,  afib, HTN,  HLD, chronic pain syndrome, SOB.    Limitations  Standing;Walking;House hold activities;Other (comment)    Diagnostic tests  see exray results: Fairly stable appearance of the levoscoliosis of the lower thoracic and lumbar spine. Stable lateral subluxation toward left of L2 with respect of L3, stable partial compression of the body of L1.    Patient Stated Goals  pain relief, standing erect       Ther-Ex -bilat modified thomas stretch + hip flex 3-5sec hold at end range thomas stretch 21min, increased tension felt in abdominals this session - Cat/cow x10 with breath control, max cuing for breath control and TC for proper form initially with good carry over following - Bird [id number] only  with cuing to increase hip ext, with patient able to complete with good carry over - [employer] thoracic ext with scap retraction 3x 8 with PT stabilization at post LE with cuing for full available ROM - Kneeling hip ext BlaTB 3x 10 with cuing for posture wit good carry over, difficulty with hip ext with maintained thoracic ext - Backward walking x1 trials of 43ft; using RW behind patient and gait belt at rear to encourage hip and thoracic ext 3x 12ft -seated thoracic ext stretch 2x 30sec hold                        PT Education - 07/20/18 0944    Education Details  therex  form    Person(s) Educated  Patient    Methods  Explanation;Demonstration;Tactile cues;Verbal cues    Comprehension  Verbalized understanding;Returned demonstration;Verbal cues required;Tactile cues required       PT Short Term Goals - 06/10/18 0949      PT SHORT TERM GOAL #1   Title  Patient will be adherent to initial HEP at least 3x a week to improve functional strength and balance for better safety at home.    Time  4    Period  Weeks    Status  New    Target Date  12/17/17        PT Long Term Goals - 07/20/18 0951      PT LONG TERM GOAL #1   Title   Patient will be independent in bending down towards floor and picking up small object (<5 pounds) and then stand back up with pain 5/10 or less to improve ability to pick up and clean up room at home    Baseline  6//29/20 8/10 with "all motion"    Time  8    Period  Weeks    Status  Revised      PT LONG TERM GOAL #2   Title  Patient will increase BLE gross strength to 4+/5 as to improve functional strength for independent gait, increased standing tolerance and increased ADL ability.    Baseline  03/18/18 R/L flexion: 4+/4+ ER 4+/4 IR 5/5 ABD 4+/5 Ext 4-/4-    Time  8    Period  Weeks    Status  Deferred      PT LONG TERM GOAL #3   Title  Patient will report a worst pain of 5/10 with transfers of her husband to improve tolerance with ADLs     Baseline  07/20/18 8/10 pain with transferring husband    Time  8    Period  Weeks    Status  On-going      PT LONG TERM GOAL #4   Title  Pt will decrease 5TSTS by at least 3 seconds in order to demonstrate clinically significant improvement in LE strength    Baseline  07/20/18 15sec 06/10/18 16sec    Time  8    Period  Weeks    Status  On-going      PT LONG TERM GOAL #5   Title  Pt will increase 10MWT by at least 0.13 m/s in order to demonstrate clinically significant improvement in community ambulation    Baseline  07/20/18 same 06/10/18 0.58m/s 15sec    Time  8    Period  Weeks     Status  On-going      PT LONG TERM GOAL #7   Title  Patient will increase standing tolerance  to 62min without increased pain in order to complete household chores/cooking    Baseline  07/20/18 Can stand for 42mins with 8/10 pain    Time  8    Period  Weeks    Status  On-going            Plan - 07/20/18 1024    Clinical Impression Statement  Patient reports she is feeling better overall slowly but surely, though she does continue to report 8/10 pain. Following session, patient reports 6/10 pain, and that she does feel better than when she came in, but does report increased fatigue this date.Able to complete all therex with proper form following some cuing from PT.    Stability/Clinical Decision Making  Evolving/Moderate complexity    Clinical Decision Making  Moderate    Rehab Potential  Fair    Clinical Impairments Affecting Rehab Potential  comorbidities, symptom duration, curvature degree    PT Frequency  2x / week    PT Duration  8 weeks    PT Treatment/Interventions  ADLs/Self Care Home Management;Aquatic Therapy;Cryotherapy;Ultrasound;Parrafin;Traction;Moist Heat;Electrical Stimulation;DME Instruction;Gait training;Stair training;Functional mobility training;Neuromuscular re-education;Balance training;Therapeutic exercise;Therapeutic activities;Patient/family education;Energy conservation;Spinal Manipulations;Joint Manipulations;Passive range of motion;Dry needling;Manual techniques    PT Next Visit Plan   hip flexor lengthening/glute strengthening, core control, attempt bridge    PT Home Exercise Plan  seated thoracic ext, see pt instruction section: diaphragmatic breathing in supine, supine R SB and lat stretch    Consulted and Agree with Plan of Care  Patient       Patient will benefit from skilled therapeutic intervention in order to improve the following deficits and impairments:  Abnormal gait, Decreased balance, Decreased endurance, Decreased mobility, Difficulty walking,  Hypomobility, Increased muscle spasms, Decreased range of motion, Pain, Postural dysfunction, Impaired flexibility, Increased fascial restricitons, Decreased strength, Decreased activity tolerance  Visit Diagnosis: 1. Other idiopathic scoliosis, lumbar region   2. Radiculopathy, lumbar region        Problem List Patient Active Problem List   Diagnosis Date Noted  . Central sleep apnea 10/09/2016  . Hypertension 10/09/2016  . Lumbar postlaminectomy syndrome 10/09/2016  . Lumbar radiculopathy 10/09/2016  . Diabetic polyneuropathy ([place]) 10/07/2016  . Right lumbosacral radiculopathy 10/07/2016  . Healthcare maintenance 08/26/2016  . DKA (diabetic ketoacidoses) ([place]) 06/14/2016  . Atypical chest pain 02/23/2016  . SOB (shortness of breath) 02/23/2016  . Pain medication agreement signed 01/13/2015  . Acquired hypothyroidism 05/30/2014  . Frontal sinusitis 12/17/2013  . Atrial fibrillation ([place]) 11/29/2013  . Major depression in remission ([place]) 11/20/2013  . 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 Chronic pain syndrome 08/27/2013  . Back pain 07/17/2013  . HTN (hypertension), benign 07/17/2013  . Hyperlipidemia 07/17/2013  . Pancreatic insufficiency 07/17/2013  . Chronic postoperative pain 01/01/2013  . Long term current use of opiate analgesic 11/04/2012  . Hypothyroidism 08/25/2012  . Radiculopathy of leg 05/01/2011   Shelton Silvas PT, DPT Shelton Silvas 07/20/2018, 10:44 AM  [facility] [address] [place], [address] Phone: [phone]   Fax:  [phone]  Name: Colleen Payne MRN: [id number] Date of Birth: 06/08/1950

## 2018-07-22 ENCOUNTER — Ambulatory Visit: Payer: Medicare Other | Attending: Anesthesiology | Admitting: Physical Therapy

## 2018-07-22 ENCOUNTER — Encounter: Payer: Self-pay | Admitting: Physical Therapy

## 2018-07-22 ENCOUNTER — Other Ambulatory Visit: Payer: Self-pay

## 2018-07-22 DIAGNOSIS — M4126 Other idiopathic scoliosis, lumbar region: Secondary | ICD-10-CM | POA: Insufficient documentation

## 2018-07-22 DIAGNOSIS — M5416 Radiculopathy, lumbar region: Secondary | ICD-10-CM | POA: Insufficient documentation

## 2018-07-22 NOTE — Therapy (Signed)
[facility] [address], [place], [address] Phone: [phone]   Fax:  [phone]  Physical Therapy Treatment  Patient Details  Name: Colleen Payne MRN: [id number] Date of Birth: 11-30-1950 Referring Provider (PT): Traci Sermon MD   Encounter Date: 07/22/2018  PT End of Session - 07/22/18 0941    Visit Number  11    Number of Visits  17    Date for PT Re-Evaluation  08/05/18    Authorization Time Period  9/10    PT Start Time  0930    PT Stop Time  1015    PT Time Calculation (min)  45 min    Activity Tolerance  Patient tolerated treatment well    Behavior During Therapy  [facility] for tasks assessed/performed       Past Medical History:  Diagnosis Date  . Anxiety   . Atrial fibrillation ([place])   . Depression   . Diabetes mellitus without complication (HCC)    Type I   . Headache    stress. 1x/month  . Hypertension   . Insulin pump in place   . Motion sickness    cars  . MVP (mitral valve prolapse)   . Neuromuscular disorder (HCC)    leg weakness s/p back surgeries  . Thyroid disease     Past Surgical History:  Procedure Laterality Date  . BACK SURGERY  2004   3 sugeries between Sept and Nov, fusion L2-S1  . CATARACT EXTRACTION W/PHACO Left 11/23/2014   Procedure: CATARACT EXTRACTION PHACO AND INTRAOCULAR LENS PLACEMENT (IOC);  Surgeon: Leandrew Koyanagi, MD;  Location: [facility];  Service: Ophthalmology;  Laterality: Left;  DIABETIC - insulin pump    There were no vitals filed for this visit.  Subjective Assessment - 07/22/18 0937    Subjective  Reports 8/10 pain in low back. Reports soreness in L ribs following stretching last session.    Pertinent History  Patient is 68 yo female that complains of lumbar pain and inability to walk erect, as well as hip/abdominal pain. Patient with history of lumbar fusion (L2-S1). Xray results show: Fairly stable appearance of the  levoscoliosis of the lower thoracic and of the lumbar spine.Stable lateral subluxation toward the left of L2 with respect L3. Stable partial compression of the body of L1. Patient complains of frustration with physicians, and that her symptoms are worsening over time. 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 Reported that her pain and difficulty worsened after trying to pick her husband up about 1 year ago. Patient mentioned that she has been told that she has "spinal fluid pockets that are leaking". PMH also includes DM I with insulin pump,  afib, HTN,  HLD, chronic pain syndrome, SOB.    Limitations  Standing;Walking;House hold activities;Other (comment)    Diagnostic tests  see exray results: Fairly stable appearance of the levoscoliosis of the lower thoracic and lumbar spine. Stable lateral subluxation toward left of L2 with respect of L3, stable partial compression of the body of L1.    Patient Stated Goals  pain relief, standing erect         Ther-Ex -bilat modified thomas stretch in recline d/t increased abdominal pain x3 min, decreasing recline each min - Diaphragmatic breathing in supine with TC for rib cage expansion  With difficulty with carry over, able to complete with decreased upper chest breathing following. Discussion  - Cat/Cow with  breath control x10 with good carry over from diaphragmatic breathing coaching - Bird [id number] only with cuing to increase hip ext, with patient able to complete with good carry over - TRX pull up 3x 10 with cuing for scapular retraction and preventing post hip translation with "lean back" good carry over following - Matrix hip ext 3x 10 25# with patient doing a better job of preventing post hip translation, made better by keeping slight flex in opposite/standing knee                                  PT Education - 07/22/18 0939    Education Details  therex form     Person(s) Educated  Patient    Methods  Explanation;Demonstration;Tactile cues;Verbal cues    Comprehension  Verbalized understanding;Returned demonstration;Verbal cues required;Tactile cues required       PT Short Term Goals - 06/10/18 0949      PT SHORT TERM GOAL #1   Title  Patient will be adherent to initial HEP at least 3x a week to improve functional strength and balance for better safety at home.    Time  4    Period  Weeks    Status  New    Target Date  12/17/17        PT Long Term Goals - 07/20/18 0951      PT LONG TERM GOAL #1   Title   Patient will be independent in bending down towards floor and picking up small object (<5 pounds) and then stand back up with pain 5/10 or less to improve ability to pick up and clean up room at home    Baseline  6//29/20 8/10 with "all motion"    Time  8    Period  Weeks    Status  Revised      PT LONG TERM GOAL #2   Title  Patient will increase BLE gross strength to 4+/5 as to improve functional strength for independent gait, increased standing tolerance and increased ADL ability.    Baseline  03/18/18 R/L flexion: 4+/4+ ER 4+/4 IR 5/5 ABD 4+/5 Ext 4-/4-    Time  8    Period  Weeks    Status  Deferred      PT LONG TERM GOAL #3   Title  Patient will report a worst pain of 5/10 with transfers of her husband to improve tolerance with ADLs     Baseline  07/20/18 8/10 pain with transferring husband    Time  8    Period  Weeks    Status  On-going      PT LONG TERM GOAL #4   Title  Pt will decrease 5TSTS by at least 3 seconds in order to demonstrate clinically significant improvement in LE strength    Baseline  07/20/18 15sec 06/10/18 16sec    Time  8    Period  Weeks    Status  On-going      PT LONG TERM GOAL #5   Title  Pt will increase 10MWT by at least 0.13 m/s in order to demonstrate clinically significant improvement in community ambulation    Baseline  07/20/18 same 06/10/18 0.64m/s 15sec    Time  8    Period  Weeks    Status   On-going      PT LONG TERM GOAL #7   Title  Patient will increase standing tolerance to  52min without increased pain in order to complete household chores/cooking    Baseline  07/20/18 Can stand for 58mins with 8/10 pain    Time  8    Period  Weeks    Status  On-going            Plan - 07/22/18 1427    Clinical Impression Statement  PT continued therex progression for increased ext posture, with success, and patient able to complete with proper form following some cuing. Pt is beginning to report "stretching" sensation felt at her L ribs, likely d/t increasing upright posture. PT educated patient on diaphragmatic breathing with rib cage expansion with decent carry over, but noted difficulty for patient in understnading breath control vs. using accessory muscles for breathing. PT will continue to work on this and progress therex as able.    Stability/Clinical Decision Making  Evolving/Moderate complexity    Clinical Decision Making  Moderate    Rehab Potential  Fair    Clinical Impairments Affecting Rehab Potential  comorbidities, symptom duration, curvature degree    PT Frequency  2x / week    PT Duration  8 weeks    PT Treatment/Interventions  ADLs/Self Care Home Management;Aquatic Therapy;Cryotherapy;Ultrasound;Parrafin;Traction;Moist Heat;Electrical Stimulation;DME Instruction;Gait training;Stair training;Functional mobility training;Neuromuscular re-education;Balance training;Therapeutic exercise;Therapeutic activities;Patient/family education;Energy conservation;Spinal Manipulations;Joint Manipulations;Passive range of motion;Dry needling;Manual techniques    PT Next Visit Plan   hip flexor lengthening/glute strengthening, core control, attempt bridge    PT Home Exercise Plan  seated thoracic ext, see pt instruction section: diaphragmatic breathing in supine, supine R SB and lat stretch    Consulted and Agree with Plan of Care  Patient       Patient will benefit from skilled  therapeutic intervention in order to improve the following deficits and impairments:  Abnormal gait, Decreased balance, Decreased endurance, Decreased mobility, Difficulty walking, Hypomobility, Increased muscle spasms, Decreased range of motion, Pain, Postural dysfunction, Impaired flexibility, Increased fascial restricitons, Decreased strength, Decreased activity tolerance  Visit Diagnosis: 1. Other idiopathic scoliosis, lumbar region   2. Radiculopathy, lumbar region        Problem List Patient Active Problem List   Diagnosis Date Noted  . Central sleep apnea 10/09/2016  . Hypertension 10/09/2016  . Lumbar postlaminectomy syndrome 10/09/2016  . Lumbar radiculopathy 10/09/2016  . Diabetic polyneuropathy ([place]) 10/07/2016  . Right lumbosacral radiculopathy 10/07/2016  . Healthcare maintenance 08/26/2016  . DKA (diabetic ketoacidoses) ([place]) 06/14/2016  . Atypical chest pain 02/23/2016  . SOB (shortness of breath) 02/23/2016  . Pain medication agreement signed 01/13/2015  . Acquired hypothyroidism 05/30/2014  . Frontal sinusitis 12/17/2013  . Atrial fibrillation ([place]) 11/29/2013  . Major depression in remission ([place]) 11/20/2013  . 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 Chronic pain syndrome 08/27/2013  . Back pain 07/17/2013  . HTN (hypertension), benign 07/17/2013  . Hyperlipidemia 07/17/2013  . Pancreatic insufficiency 07/17/2013  . Chronic postoperative pain 01/01/2013  . Long term current use of opiate analgesic 11/04/2012  . Hypothyroidism 08/25/2012  . Radiculopathy of leg 05/01/2011   Shelton Silvas PT, DPT Shelton Silvas 07/22/2018, 2:31 PM  [facility] [address] [place], [address] Phone: [phone]   Fax:  [phone]  Name: Colleen Payne MRN: [id number] Date of Birth: [birth year]

## 2018-07-27 ENCOUNTER — Ambulatory Visit: Payer: Medicare Other | Admitting: Physical Therapy

## 2018-07-27 ENCOUNTER — Encounter: Payer: Self-pay | Admitting: Physical Therapy

## 2018-07-27 ENCOUNTER — Other Ambulatory Visit: Payer: Self-pay

## 2018-07-27 DIAGNOSIS — M4126 Other idiopathic scoliosis, lumbar region: Secondary | ICD-10-CM | POA: Diagnosis not present

## 2018-07-27 DIAGNOSIS — M5416 Radiculopathy, lumbar region: Secondary | ICD-10-CM

## 2018-07-27 NOTE — Therapy (Signed)
[facility] [address] [place], [address] Phone: [phone]   Fax:  [phone]  Physical Therapy Treatment  Patient Details  Name: Colleen Payne MRN: [id number] Date of Birth: 09/29/1950 Referring Provider (PT): Traci Sermon MD   Encounter Date: 07/27/2018  PT End of Session - 07/27/18 0932    Visit Number  12    Number of Visits  17    Date for PT Re-Evaluation  08/05/18    Authorization Time Period  10/10    PT Start Time  0919    PT Stop Time  1000    PT Time Calculation (min)  41 min    Activity Tolerance  Patient tolerated treatment well    Behavior During Therapy  [facility] for tasks assessed/performed       Past Medical History:  Diagnosis Date  . Anxiety   . Atrial fibrillation ([place])   . Depression   . Diabetes mellitus without complication (HCC)    Type I   . Headache    stress. 1x/month  . Hypertension   . Insulin pump in place   . Motion sickness    cars  . MVP (mitral valve prolapse)   . Neuromuscular disorder (HCC)    leg weakness s/p back surgeries  . Thyroid disease     Past Surgical History:  Procedure Laterality Date  . BACK SURGERY  2004   3 sugeries between Sept and Nov, fusion L2-S1  . CATARACT EXTRACTION W/PHACO Left 11/23/2014   Procedure: CATARACT EXTRACTION PHACO AND INTRAOCULAR LENS PLACEMENT (IOC);  Surgeon: Leandrew Koyanagi, MD;  Location: [facility];  Service: Ophthalmology;  Laterality: Left;  DIABETIC - insulin pump    There were no vitals filed for this visit.  Subjective Assessment - 07/27/18 0925    Subjective  Pt continues to report abdominal tension that is making it hard for her to stand up straight. Reports compliance with HEP. 7/10 pain today.    Pertinent History  Patient is 68 yo female that complains of lumbar pain and inability to walk erect, as well as hip/abdominal pain. Patient with history of lumbar fusion (L2-S1). Xray  results show: Fairly stable appearance of the levoscoliosis of the lower thoracic and of the lumbar spine.Stable lateral subluxation toward the left of L2 with respect L3. Stable partial compression of the body of L1. Patient complains of frustration with physicians, and that her symptoms are worsening over time. 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 Reported that her pain and difficulty worsened after trying to pick her husband up about 1 year ago. Patient mentioned that she has been told that she has "spinal fluid pockets that are leaking". PMH also includes DM I with insulin pump,  afib, HTN,  HLD, chronic pain syndrome, SOB.    Limitations  Standing;Walking;House hold activities;Other (comment)    Diagnostic tests  see exray results: Fairly stable appearance of the levoscoliosis of the lower thoracic and lumbar spine. Stable lateral subluxation toward left of L2 with respect of L3, stable partial compression of the body of L1.    Patient Stated Goals  pain relief, standing erect           Ther-Ex -bilat modified thomas stretchin recline d/t increased abdominal pain x3 min, decreasing recline each min - Cat/Cow with breath control x10 with min cuing for proper form with good carry over following - Bird [id number] only  with good carry over of proper form  - Attempted prone thoracic ext without UE, unable, modified to prone press with focus on back extensors, with cuing for UE to "just helpers" 3x 10 - TRX pull up 3x 10 with cuing for true "lean back" with hips forward, shoulder lean back - Matrix hip ext 3x 10 25# with patient doing a better job of preventing post hip translation, made better by keeping slight flex in opposite/standing knee                     PT Education - 07/27/18 0932    Education Details  therex form    Person(s) Educated  Patient    Methods  Explanation;Demonstration;Tactile cues;Verbal cues     Comprehension  Verbalized understanding;Returned demonstration;Verbal cues required;Tactile cues required       PT Short Term Goals - 07/27/18 0933      PT SHORT TERM GOAL #1   Title  Patient will be adherent to initial HEP at least 3x a week to improve functional strength and balance for better safety at home.    Time  4    Period  Weeks    Status  Achieved    Target Date  12/17/17        PT Long Term Goals - 07/20/18 0951      PT LONG TERM GOAL #1   Title   Patient will be independent in bending down towards floor and picking up small object (<5 pounds) and then stand back up with pain 5/10 or less to improve ability to pick up and clean up room at home    Baseline  6//29/20 8/10 with "all motion"    Time  8    Period  Weeks    Status  Revised      PT LONG TERM GOAL #2   Title  Patient will increase BLE gross strength to 4+/5 as to improve functional strength for independent gait, increased standing tolerance and increased ADL ability.    Baseline  03/18/18 R/L flexion: 4+/4+ ER 4+/4 IR 5/5 ABD 4+/5 Ext 4-/4-    Time  8    Period  Weeks    Status  Deferred      PT LONG TERM GOAL #3   Title  Patient will report a worst pain of 5/10 with transfers of her husband to improve tolerance with ADLs     Baseline  07/20/18 8/10 pain with transferring husband    Time  8    Period  Weeks    Status  On-going      PT LONG TERM GOAL #4   Title  Pt will decrease 5TSTS by at least 3 seconds in order to demonstrate clinically significant improvement in LE strength    Baseline  07/20/18 15sec 06/10/18 16sec    Time  8    Period  Weeks    Status  On-going      PT LONG TERM GOAL #5   Title  Pt will increase 10MWT by at least 0.13 m/s in order to demonstrate clinically significant improvement in community ambulation    Baseline  07/20/18 same 06/10/18 0.43m/s 15sec    Time  8    Period  Weeks    Status  On-going      PT LONG TERM GOAL #7   Title  Patient will increase standing tolerance  to 76min without increased pain in order to complete household chores/cooking    Baseline  07/20/18 Can stand for 14mins with 8/10 pain    Time  8    Period  Weeks    Status  On-going            Plan - 07/27/18 0951    Clinical Impression Statement  PT continued therex progression for thoracic and hip ext with patient demonstrating good form and carry over, needing cuing at times for proper muscle activiation and technique. No increased pain throughout session, only noted muscle fatigue. Pt reports she is feeling some soreness/stretching in her L abdominals (concave part of scolosis S curve), PT educated patient that this is a good thing, and it is not threatening; that she is providing enough opposite stretch for those muscles to be sore. PT will continue progression as able.    Stability/Clinical Decision Making  Evolving/Moderate complexity    Rehab Potential  Fair    Clinical Impairments Affecting Rehab Potential  comorbidities, symptom duration, curvature degree    PT Frequency  2x / week    PT Duration  8 weeks    PT Treatment/Interventions  ADLs/Self Care Home Management;Aquatic Therapy;Cryotherapy;Ultrasound;Parrafin;Traction;Moist Heat;Electrical Stimulation;DME Instruction;Gait training;Stair training;Functional mobility training;Neuromuscular re-education;Balance training;Therapeutic exercise;Therapeutic activities;Patient/family education;Energy conservation;Spinal Manipulations;Joint Manipulations;Passive range of motion;Dry needling;Manual techniques    PT Next Visit Plan   hip flexor lengthening/glute strengthening, core control, attempt bridge    PT Home Exercise Plan  seated thoracic ext, see pt instruction section: diaphragmatic breathing in supine, supine R SB and lat stretch    Consulted and Agree with Plan of Care  Patient       Patient will benefit from skilled therapeutic intervention in order to improve the following deficits and impairments:  Abnormal gait,  Decreased balance, Decreased endurance, Decreased mobility, Difficulty walking, Hypomobility, Increased muscle spasms, Decreased range of motion, Pain, Postural dysfunction, Impaired flexibility, Increased fascial restricitons, Decreased strength, Decreased activity tolerance  Visit Diagnosis: 1. Other idiopathic scoliosis, lumbar region   2. Radiculopathy, lumbar region        Problem List Patient Active Problem List   Diagnosis Date Noted  . Central sleep apnea 10/09/2016  . Hypertension 10/09/2016  . Lumbar postlaminectomy syndrome 10/09/2016  . Lumbar radiculopathy 10/09/2016  . Diabetic polyneuropathy ([place]) 10/07/2016  . Right lumbosacral radiculopathy 10/07/2016  . Healthcare maintenance 08/26/2016  . DKA (diabetic ketoacidoses) ([place]) 06/14/2016  . Atypical chest pain 02/23/2016  . SOB (shortness of breath) 02/23/2016  . Pain medication agreement signed 01/13/2015  . Acquired hypothyroidism 05/30/2014  . Frontal sinusitis 12/17/2013  . Atrial fibrillation ([place]) 11/29/2013  . Major depression in remission ([place]) 11/20/2013  . 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 Chronic pain syndrome 08/27/2013  . Back pain 07/17/2013  . HTN (hypertension), benign 07/17/2013  . Hyperlipidemia 07/17/2013  . Pancreatic insufficiency 07/17/2013  . Chronic postoperative pain 01/01/2013  . Long term current use of opiate analgesic 11/04/2012  . Hypothyroidism 08/25/2012  . Radiculopathy of leg 05/01/2011   Shelton Silvas PT, DPT Shelton Silvas 07/27/2018, 10:02 AM  [facility] [address], [place], [address] Phone: [phone]   Fax:  [phone]  Name: DELLANIRA DILLOW MRN: [id number] Date of Birth: [birth year]

## 2018-07-29 ENCOUNTER — Encounter: Payer: Medicare Other | Admitting: Physical Therapy

## 2018-07-31 ENCOUNTER — Other Ambulatory Visit: Payer: Self-pay

## 2018-07-31 ENCOUNTER — Ambulatory Visit: Payer: Medicare Other | Admitting: Physical Therapy

## 2018-07-31 ENCOUNTER — Encounter: Payer: Self-pay | Admitting: Physical Therapy

## 2018-07-31 DIAGNOSIS — M5416 Radiculopathy, lumbar region: Secondary | ICD-10-CM

## 2018-07-31 DIAGNOSIS — M4126 Other idiopathic scoliosis, lumbar region: Secondary | ICD-10-CM | POA: Diagnosis not present

## 2018-07-31 NOTE — Addendum Note (Signed)
Addended by: Shelton Silvas on: 07/31/2018 11:56 AM   Modules accepted: Orders

## 2018-07-31 NOTE — Therapy (Addendum)
[facility] [address], [place], [address] Phone: [phone]   Fax:  [phone]  Physical Therapy Treatment/Re- Assessment Reporting Period 07/20/18 - 07/31/18  Patient Details  Name: Colleen Payne MRN: [id number] Date of Birth: November 06, 1950 Referring Provider (PT): Traci Sermon MD   Encounter Date: 07/31/2018  PT End of Session - 07/31/18 1018    Visit Number  13    Number of Visits  33    Date for PT Re-Evaluation  09/25/18    Authorization Type  Medicare    Authorization Time Period  1/10    PT Start Time  1000    PT Stop Time  1040    PT Time Calculation (min)  40 min    Activity Tolerance  Patient tolerated treatment well    Behavior During Therapy  [facility] for tasks assessed/performed       Past Medical History:  Diagnosis Date  . Anxiety   . Atrial fibrillation ([place])   . Depression   . Diabetes mellitus without complication (HCC)    Type I   . Headache    stress. 1x/month  . Hypertension   . Insulin pump in place   . Motion sickness    cars  . MVP (mitral valve prolapse)   . Neuromuscular disorder (HCC)    leg weakness s/p back surgeries  . Thyroid disease     Past Surgical History:  Procedure Laterality Date  . BACK SURGERY  2004   3 sugeries between Sept and Nov, fusion L2-S1  . CATARACT EXTRACTION W/PHACO Left 11/23/2014   Procedure: CATARACT EXTRACTION PHACO AND INTRAOCULAR LENS PLACEMENT (IOC);  Surgeon: Leandrew Koyanagi, MD;  Location: [facility];  Service: Ophthalmology;  Laterality: Left;  DIABETIC - insulin pump    There were no vitals filed for this visit.  Subjective Assessment - 07/31/18 1005    Subjective  Patient reports she did not sleep well through the night d/t her husband needing a lot of help. Reports her pain is 8/10 today.    Pertinent History  Patient is 68 yo female that complains of lumbar pain and inability to walk erect, as well as  hip/abdominal pain. Patient with history of lumbar fusion (L2-S1). Xray results show: Fairly stable appearance of the levoscoliosis of the lower thoracic and of the lumbar spine.Stable lateral subluxation toward the left of L2 with respect L3. Stable partial compression of the body of L1. Patient complains of frustration with physicians, and that her symptoms are worsening over time. 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 Reported that her pain and difficulty worsened after trying to pick her husband up about 1 year ago. Patient mentioned that she has been told that she has "spinal fluid pockets that are leaking". PMH also includes DM I with insulin pump,  afib, HTN,  HLD, chronic pain syndrome, SOB.    Limitations  Standing;Walking;House hold activities;Other (comment)    Diagnostic tests  see exray results: Fairly stable appearance of the levoscoliosis of the lower thoracic and lumbar spine. Stable lateral subluxation toward left of L2 with respect of L3, stable partial compression of the body of L1.    Patient Stated Goals  pain relief, standing erect    Pain Onset  More than a month ago          Ther-Ex -bilat modified thomas stretchin recline d/t increased abdominal pain x3 min, decreasing recline  each min - OMEGA standing rows 10# 3x 10 with cuing for proper posture (availible) with good carry over following - OMEGA bent over rows with bar 10# 3x 10 with cuing initially for set up with good carry over following - Matrix hip ext 3x 10 40# withTC initially to keep hips forward, good carry over following - Thoracic ext over table 3x 10 some heel lift on RLE   Therapeutic activity: Gait speed with cane assessment x2 trials where pt is able to increase speed following for inc step length 5sSTS without UE support x2 trials, with increased speed following cuing for glute contraction Education on pain as a perception of  experience                    PT Education - 07/31/18 1016    Education Details  Therex form; pain science    Person(s) Educated  Patient    Methods  Explanation;Demonstration;Tactile cues;Verbal cues    Comprehension  Verbalized understanding;Returned demonstration;Verbal cues required;Tactile cues required       PT Short Term Goals - 07/27/18 0933      PT SHORT TERM GOAL #1   Title  Patient will be adherent to initial HEP at least 3x a week to improve functional strength and balance for better safety at home.    Time  4    Period  Weeks    Status  Achieved    Target Date  12/17/17        PT Long Term Goals - 07/31/18 1006      PT LONG TERM GOAL #1   Title   Patient will be independent in bending down towards floor and picking up small object (<5 pounds) and then stand back up with pain 5/10 or less to improve ability to pick up and clean up room at home    Baseline  08/10/18 8/10 with "all motion", able to pick up object    Time  8    Period  Weeks    Status  On-going      PT LONG TERM GOAL #2   Title  Patient will increase BLE gross strength to 4+/5 as to improve functional strength for independent gait, increased standing tolerance and increased ADL ability.    Baseline  03/18/18 R/L flexion: 4+/4+ ER 4+/4 IR 5/5 ABD 4+/5 Ext 4/4    Time  8    Period  Weeks    Status  On-going      PT LONG TERM GOAL #3   Title  Patient will report a worst pain of 5/10 with transfers of her husband to improve tolerance with ADLs     Baseline  07/31/18 8-10/10 pain with transfers    Time  8    Period  Weeks    Status  On-going      PT LONG TERM GOAL #4   Title  Pt will decrease 5TSTS by at least 3 seconds in order to demonstrate clinically significant improvement in LE strength    Baseline  07/31/18 13sec 07/20/18 15sec 06/10/18 16sec    Time  8    Period  Days    Status  Achieved      PT LONG TERM GOAL #5   Title  Pt will increase 10MWT by at least 0.13 m/s in order  to demonstrate clinically significant improvement in community ambulation    Baseline  07/31/18 0.33m/s 07/20/18 same 06/10/18 0.79m/s    Time  8  Period  Weeks    Status  On-going      PT LONG TERM GOAL #7   Title  Patient will increase standing tolerance to 1min without increased pain in order to complete household chores/cooking    Baseline  07/31/18 Can stand for 79mins with 8/10 pain    Time  8    Period  Weeks    Status  On-going            Plan - 07/31/18 1032    Clinical Impression Statement  PT continued therex progression with increased standing tolerance. Pt is able to complete all therex with accuracy following cuing for proper form/muscle activation with good carry over, no increased pain, and noted muscle fatigue. PT spent time educating patient on bio-social factors of pain perception and experience. Patient verbalizes understanding, but does not seem to be in a state of acceptance. Patient has a difficulty time coming up with a plan to decrease stress, and improve sleep health d/t being her husbands caretaker, This will continue to be an obstacle of pain perception, that PT will continue to work on while increasing postural muscle strength and endurance for improved QOL and ADL function.    Stability/Clinical Decision Making  Evolving/Moderate complexity    Clinical Decision Making  Moderate    Rehab Potential  Fair    Clinical Impairments Affecting Rehab Potential  comorbidities, symptom duration, curvature degree    PT Frequency  2x / week    PT Duration  8 weeks    PT Treatment/Interventions  ADLs/Self Care Home Management;Aquatic Therapy;Cryotherapy;Ultrasound;Parrafin;Traction;Moist Heat;Electrical Stimulation;DME Instruction;Gait training;Stair training;Functional mobility training;Neuromuscular re-education;Balance training;Therapeutic exercise;Therapeutic activities;Patient/family education;Energy conservation;Spinal Manipulations;Joint Manipulations;Passive range  of motion;Dry needling;Manual techniques    PT Next Visit Plan   hip flexor lengthening/glute strengthening, core control, attempt bridge    PT Home Exercise Plan  seated thoracic ext, see pt instruction section: diaphragmatic breathing in supine, supine R SB and lat stretch    Consulted and Agree with Plan of Care  Patient       Patient will benefit from skilled therapeutic intervention in order to improve the following deficits and impairments:  Abnormal gait, Decreased balance, Decreased endurance, Decreased mobility, Difficulty walking, Hypomobility, Increased muscle spasms, Decreased range of motion, Pain, Postural dysfunction, Impaired flexibility, Increased fascial restricitons, Decreased strength, Decreased activity tolerance  Visit Diagnosis: 1. Other idiopathic scoliosis, lumbar region   2. Radiculopathy, lumbar region        Problem List Patient Active Problem List   Diagnosis Date Noted  . Central sleep apnea 10/09/2016  . Hypertension 10/09/2016  . Lumbar postlaminectomy syndrome 10/09/2016  . Lumbar radiculopathy 10/09/2016  . Diabetic polyneuropathy ([place]) 10/07/2016  . Right lumbosacral radiculopathy 10/07/2016  . Healthcare maintenance 08/26/2016  . DKA (diabetic ketoacidoses) ([place]) 06/14/2016  . Atypical chest pain 02/23/2016  . SOB (shortness of breath) 02/23/2016  . Pain medication agreement signed 01/13/2015  . Acquired hypothyroidism 05/30/2014  . Frontal sinusitis 12/17/2013  . Atrial fibrillation ([place]) 11/29/2013  . Major depression in remission ([place]) 11/20/2013  . 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 Chronic pain syndrome 08/27/2013  . Back pain 07/17/2013  . HTN (hypertension), benign 07/17/2013  . Hyperlipidemia 07/17/2013  . Pancreatic insufficiency 07/17/2013  . Chronic postoperative pain 01/01/2013  . Long term current use of opiate analgesic 11/04/2012  . Hypothyroidism 08/25/2012  . Radiculopathy of  leg 05/01/2011   Shelton Silvas PT, DPT Shelton Silvas 07/31/2018, 10:45 AM  [facility] [address] [place], [address] Phone: [phone]   Fax:  [phone]  Name: WILHEMINA GRALL MRN: [id number] Date of Birth: [birth year]

## 2018-08-03 ENCOUNTER — Other Ambulatory Visit: Payer: Self-pay

## 2018-08-03 ENCOUNTER — Encounter: Payer: Self-pay | Admitting: Physical Therapy

## 2018-08-03 ENCOUNTER — Ambulatory Visit: Payer: Medicare Other | Admitting: Physical Therapy

## 2018-08-03 DIAGNOSIS — M4126 Other idiopathic scoliosis, lumbar region: Secondary | ICD-10-CM | POA: Diagnosis not present

## 2018-08-03 DIAGNOSIS — M5416 Radiculopathy, lumbar region: Secondary | ICD-10-CM

## 2018-08-03 NOTE — Therapy (Signed)
[facility] [address], [place], [address] Phone: [phone]   Fax:  [phone]  Physical Therapy Treatment  Patient Details  Name: Colleen Payne MRN: [id number] Date of Birth: 13-Nov-1950 Referring Provider (PT): Traci Sermon MD   Encounter Date: 08/03/2018  PT End of Session - 08/03/18 0948    Visit Number  14    Number of Visits  33    Date for PT Re-Evaluation  09/25/18    Authorization Time Period  2/10    PT Start Time  0918    PT Stop Time  0957    PT Time Calculation (min)  39 min    Activity Tolerance  Patient tolerated treatment well    Behavior During Therapy  [facility] for tasks assessed/performed       Past Medical History:  Diagnosis Date  . Anxiety   . Atrial fibrillation ([place])   . Depression   . Diabetes mellitus without complication (HCC)    Type I   . Headache    stress. 1x/month  . Hypertension   . Insulin pump in place   . Motion sickness    cars  . MVP (mitral valve prolapse)   . Neuromuscular disorder (HCC)    leg weakness s/p back surgeries  . Thyroid disease     Past Surgical History:  Procedure Laterality Date  . BACK SURGERY  2004   3 sugeries between Sept and Nov, fusion L2-S1  . CATARACT EXTRACTION W/PHACO Left 11/23/2014   Procedure: CATARACT EXTRACTION PHACO AND INTRAOCULAR LENS PLACEMENT (IOC);  Surgeon: Leandrew Koyanagi, MD;  Location: [facility];  Service: Ophthalmology;  Laterality: Left;  DIABETIC - insulin pump    There were no vitals filed for this visit.  Subjective Assessment - 08/03/18 0923    Subjective  Patient reports she is continuing to not have good sleep and is having 7/10 pain today. Reports continued pulling at abdominals.    Pertinent History  Patient is 68 yo female that complains of lumbar pain and inability to walk erect, as well as hip/abdominal pain. Patient with history of lumbar fusion (L2-S1). Xray results show:  Fairly stable appearance of the levoscoliosis of the lower thoracic and of the lumbar spine.Stable lateral subluxation toward the left of L2 with respect L3. Stable partial compression of the body of L1. Patient complains of frustration with physicians, and that her symptoms are worsening over time. 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 Reported that her pain and difficulty worsened after trying to pick her husband up about 1 year ago. Patient mentioned that she has been told that she has "spinal fluid pockets that are leaking". PMH also includes DM I with insulin pump,  afib, HTN,  HLD, chronic pain syndrome, SOB.    Limitations  Standing;Walking;House hold activities;Other (comment)    Diagnostic tests  see exray results: Fairly stable appearance of the levoscoliosis of the lower thoracic and lumbar spine. Stable lateral subluxation toward left of L2 with respect of L3, stable partial compression of the body of L1.    Patient Stated Goals  pain relief, standing erect         Ther-Ex -bilat modified thomas stretch supine bilat knee flex > stretch x10sec hold x12 sets - OMEGA standing rows 10# x10; 15# 2x 8 with cuing for proper posture (availible) with decent carry over following - Thoracic ext over table 2x 10  some heel lift on RLE; education on anatomy  as to why heel is lifting and maintaining knee ext, as this is a compensation at hips/low back that is impacting gastroc lengthening - gastroc stretch on small wedge 10sec stretch with TC at hips for ext x5  - Backward walking with RW and RTB at ankles 6x 41ft with cuing for knee and hip ext - Good mornings 3x 8 with mat table at post thighs for safety and cuing for proper posture and form with good carry over following; better carry over with UE outstretched                      PT Education - 08/03/18 0947    Education Details  therex form    Person(s) Educated  Patient     Methods  Explanation;Demonstration;Tactile cues;Verbal cues    Comprehension  Verbalized understanding;Returned demonstration;Verbal cues required;Tactile cues required       PT Short Term Goals - 07/27/18 0933      PT SHORT TERM GOAL #1   Title  Patient will be adherent to initial HEP at least 3x a week to improve functional strength and balance for better safety at home.    Time  4    Period  Weeks    Status  Achieved    Target Date  12/17/17        PT Long Term Goals - 07/31/18 1006      PT LONG TERM GOAL #1   Title   Patient will be independent in bending down towards floor and picking up small object (<5 pounds) and then stand back up with pain 5/10 or less to improve ability to pick up and clean up room at home    Baseline  08/10/18 8/10 with "all motion", able to pick up object    Time  8    Period  Weeks    Status  On-going      PT LONG TERM GOAL #2   Title  Patient will increase BLE gross strength to 4+/5 as to improve functional strength for independent gait, increased standing tolerance and increased ADL ability.    Baseline  03/18/18 R/L flexion: 4+/4+ ER 4+/4 IR 5/5 ABD 4+/5 Ext 4/4    Time  8    Period  Weeks    Status  On-going      PT LONG TERM GOAL #3   Title  Patient will report a worst pain of 5/10 with transfers of her husband to improve tolerance with ADLs     Baseline  07/31/18 8-10/10 pain with transfers    Time  8    Period  Weeks    Status  On-going      PT LONG TERM GOAL #4   Title  Pt will decrease 5TSTS by at least 3 seconds in order to demonstrate clinically significant improvement in LE strength    Baseline  07/31/18 13sec 07/20/18 15sec 06/10/18 16sec    Time  8    Period  Days    Status  Achieved      PT LONG TERM GOAL #5   Title  Pt will increase 10MWT by at least 0.13 m/s in order to demonstrate clinically significant improvement in community ambulation    Baseline  07/31/18 0.35m/s 07/20/18 same 06/10/18 0.60m/s    Time  8    Period  Weeks     Status  On-going      PT LONG TERM  GOAL #7   Title  Patient will increase standing tolerance to 10min without increased pain in order to complete household chores/cooking    Baseline  07/31/18 Can stand for 20mins with 8/10 pain    Time  8    Period  Weeks    Status  On-going            Plan - 08/03/18 1257    Clinical Impression Statement  PT continued therex progression with increased standing tolerance. Patient is able to complete all therex with accuracy following PT cuing, without increased pain, only noted muscle fatigue. Following session patient reports she has no pain, which is the first time she has reported this, and she is very pleasd, although she does admit if she is worried if she reports she has no pain that PT will not be able to keep seeing her. PT educated patient that the goal of PT is to continue to show progress.    Stability/Clinical Decision Making  Evolving/Moderate complexity    Clinical Decision Making  Moderate    Rehab Potential  Fair    Clinical Impairments Affecting Rehab Potential  comorbidities, symptom duration, curvature degree    PT Frequency  2x / week    PT Duration  8 weeks    PT Treatment/Interventions  ADLs/Self Care Home Management;Aquatic Therapy;Cryotherapy;Ultrasound;Parrafin;Traction;Moist Heat;Electrical Stimulation;DME Instruction;Gait training;Stair training;Functional mobility training;Neuromuscular re-education;Balance training;Therapeutic exercise;Therapeutic activities;Patient/family education;Energy conservation;Spinal Manipulations;Joint Manipulations;Passive range of motion;Dry needling;Manual techniques    PT Next Visit Plan   hip flexor lengthening/glute strengthening, core control, attempt bridge    PT Home Exercise Plan  seated thoracic ext, see pt instruction section: diaphragmatic breathing in supine, supine R SB and lat stretch    Consulted and Agree with Plan of Care  Patient       Patient will benefit from skilled  therapeutic intervention in order to improve the following deficits and impairments:  Abnormal gait, Decreased balance, Decreased endurance, Decreased mobility, Difficulty walking, Hypomobility, Increased muscle spasms, Decreased range of motion, Pain, Postural dysfunction, Impaired flexibility, Increased fascial restricitons, Decreased strength, Decreased activity tolerance  Visit Diagnosis: 1. Other idiopathic scoliosis, lumbar region   2. Radiculopathy, lumbar region        Problem List Patient Active Problem List   Diagnosis Date Noted  . Central sleep apnea 10/09/2016  . Hypertension 10/09/2016  . Lumbar postlaminectomy syndrome 10/09/2016  . Lumbar radiculopathy 10/09/2016  . Diabetic polyneuropathy ([place]) 10/07/2016  . Right lumbosacral radiculopathy 10/07/2016  . Healthcare maintenance 08/26/2016  . DKA (diabetic ketoacidoses) ([place]) 06/14/2016  . Atypical chest pain 02/23/2016  . SOB (shortness of breath) 02/23/2016  . Pain medication agreement signed 01/13/2015  . Acquired hypothyroidism 05/30/2014  . Frontal sinusitis 12/17/2013  . Atrial fibrillation ([place]) 11/29/2013  . Major depression in remission ([place]) 11/20/2013  . 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 Chronic pain syndrome 08/27/2013  . Back pain 07/17/2013  . HTN (hypertension), benign 07/17/2013  . Hyperlipidemia 07/17/2013  . Pancreatic insufficiency 07/17/2013  . Chronic postoperative pain 01/01/2013  . Long term current use of opiate analgesic 11/04/2012  . Hypothyroidism 08/25/2012  . Radiculopathy of leg 05/01/2011   Shelton Silvas PT, DPT Shelton Silvas 08/03/2018, 1:21 PM  [facility] [address] [place], [address] Phone: [phone]   Fax:  [phone]  Name: Colleen Payne MRN: [id number] Date of Birth: 30-Dec-1950

## 2018-08-05 ENCOUNTER — Encounter: Payer: Medicare Other | Admitting: Physical Therapy

## 2018-08-07 ENCOUNTER — Ambulatory Visit: Payer: Medicare Other | Admitting: Physical Therapy

## 2018-08-07 ENCOUNTER — Encounter: Payer: Self-pay | Admitting: Physical Therapy

## 2018-08-07 ENCOUNTER — Other Ambulatory Visit: Payer: Self-pay

## 2018-08-07 DIAGNOSIS — M4126 Other idiopathic scoliosis, lumbar region: Secondary | ICD-10-CM | POA: Diagnosis not present

## 2018-08-07 DIAGNOSIS — M5416 Radiculopathy, lumbar region: Secondary | ICD-10-CM

## 2018-08-07 NOTE — Therapy (Signed)
[facility] [address], [place], [address] Phone: [phone]   Fax:  [phone]  Physical Therapy Treatment  Patient Details  Name: Colleen Payne MRN: [id number] Date of Birth: August 06, 1950 Referring Provider (PT): Traci Sermon MD   Encounter Date: 08/07/2018  PT End of Session - 08/07/18 1017    Visit Number  15    Number of Visits  33    Date for PT Re-Evaluation  09/25/18    Authorization Type  Medicare    Authorization Time Period  3/10    PT Start Time  1000    PT Stop Time  1045    PT Time Calculation (min)  45 min    Activity Tolerance  Patient tolerated treatment well    Behavior During Therapy  [facility] for tasks assessed/performed       Past Medical History:  Diagnosis Date  . Anxiety   . Atrial fibrillation ([place])   . Depression   . Diabetes mellitus without complication (HCC)    Type I   . Headache    stress. 1x/month  . Hypertension   . Insulin pump in place   . Motion sickness    cars  . MVP (mitral valve prolapse)   . Neuromuscular disorder (HCC)    leg weakness s/p back surgeries  . Thyroid disease     Past Surgical History:  Procedure Laterality Date  . BACK SURGERY  2004   3 sugeries between Sept and Nov, fusion L2-S1  . CATARACT EXTRACTION W/PHACO Left 11/23/2014   Procedure: CATARACT EXTRACTION PHACO AND INTRAOCULAR LENS PLACEMENT (IOC);  Surgeon: Leandrew Koyanagi, MD;  Location: [place];  Service: Ophthalmology;  Laterality: Left;  DIABETIC - insulin pump    There were no vitals filed for this visit.  Subjective Assessment - 08/07/18 1008    Subjective  Reports 7/10 pain today. Reports compliance with HEP. Sleep health is not improving. Reports she had no pain following last session that lasted on into the evening    Pertinent History  Patient is 68 yo female that complains of lumbar pain and inability to walk erect, as well as hip/abdominal pain.  Patient with history of lumbar fusion (L2-S1). Xray results show: Fairly stable appearance of the levoscoliosis of the lower thoracic and of the lumbar spine.Stable lateral subluxation toward the left of L2 with respect L3. Stable partial compression of the body of L1. Patient complains of frustration with physicians, and that her symptoms are worsening over time. 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 Reported that her pain and difficulty worsened after trying to pick her husband up about 1 year ago. Patient mentioned that she has been told that she has "spinal fluid pockets that are leaking". PMH also includes DM I with insulin pump,  afib, HTN,  HLD, chronic pain syndrome, SOB.    Limitations  Standing;Walking;House hold activities;Other (comment)    Diagnostic tests  see exray results: Fairly stable appearance of the levoscoliosis of the lower thoracic and lumbar spine. Stable lateral subluxation toward left of L2 with respect of L3, stable partial compression of the body of L1.    Patient Stated Goals  pain relief, standing erect    Pain Onset  More than a month ago             Ther-Ex -bilat modified thomas stretch supine bilat knee flex > stretch x10sec hold x12  sets - Good mornings 3x 10 with mat table at post thighs for safety, difficulty with neutral spine without knee flex, able to improve with cuing - Stair gastroc stretch 2x 30sec hold - Standing bilat DF, patient unable to complete any lift on RLE; modified to seated bilat DF 2x 10 - Standing ext over table 3x 10 with difficulty maintaining R knee ext, with some heel lift - R TKE RTB 3x 10 with demo and cuing for set up with good carry over following; cuing for posture throughout  - OMEGA standing rows  15# 3x 8 with cuing for proper posture (availible) with decent carry over following    Gave HEP printout: Modified Thomas Stretch - 3 sets - 10 hold - 3x daily - 7x weekly   Standing Bilateral Gastroc Stretch with Step - 3 sets - 10 hold - 3x daily - 7x weekly  Standing Lumbar Extension with Counter - 10 reps - 3x daily - 7x weekly Standing Hip Abduction with Unilateral Counter Support - 10 reps - 3 sets - 1x daily - 7x weekly  Standing Hip Extension with Counter Support - 10 reps - 3 sets - 1x daily - 7x weekly  Seated Toe Raise - 10 reps - 3 sets - 1x daily - 7x weekly                    PT Education - 08/07/18 1011    Education Details  therex form    Person(s) Educated  Patient    Methods  Explanation;Demonstration;Tactile cues;Verbal cues    Comprehension  Verbalized understanding;Returned demonstration;Verbal cues required;Tactile cues required       PT Short Term Goals - 07/27/18 0933      PT SHORT TERM GOAL #1   Title  Patient will be adherent to initial HEP at least 3x a week to improve functional strength and balance for better safety at home.    Time  4    Period  Weeks    Status  Achieved    Target Date  12/17/17        PT Long Term Goals - 07/31/18 1006      PT LONG TERM GOAL #1   Title   Patient will be independent in bending down towards floor and picking up small object (<5 pounds) and then stand back up with pain 5/10 or less to improve ability to pick up and clean up room at home    Baseline  08/10/18 8/10 with "all motion", able to pick up object    Time  8    Period  Weeks    Status  On-going      PT LONG TERM GOAL #2   Title  Patient will increase BLE gross strength to 4+/5 as to improve functional strength for independent gait, increased standing tolerance and increased ADL ability.    Baseline  03/18/18 R/L flexion: 4+/4+ ER 4+/4 IR 5/5 ABD 4+/5 Ext 4/4    Time  8    Period  Weeks    Status  On-going      PT LONG TERM GOAL #3   Title  Patient will report a worst pain of 5/10 with transfers of her husband to improve tolerance with ADLs     Baseline  07/31/18 8-10/10 pain with transfers    Time  8     Period  Weeks    Status  On-going      PT LONG TERM GOAL #4  Title  Pt will decrease 5TSTS by at least 3 seconds in order to demonstrate clinically significant improvement in LE strength    Baseline  07/31/18 13sec 07/20/18 15sec 06/10/18 16sec    Time  8    Period  Days    Status  Achieved      PT LONG TERM GOAL #5   Title  Pt will increase 10MWT by at least 0.13 m/s in order to demonstrate clinically significant improvement in community ambulation    Baseline  07/31/18 0.54m/s 07/20/18 same 06/10/18 0.80m/s    Time  8    Period  Weeks    Status  On-going      PT LONG TERM GOAL #7   Title  Patient will increase standing tolerance to 20min without increased pain in order to complete household chores/cooking    Baseline  07/31/18 Can stand for 17mins with 8/10 pain    Time  8    Period  Weeks    Status  On-going            Plan - 08/07/18 1036    Clinical Impression Statement  PT continued therex progression with standing and ext based therex. Patient is able to complete all therex with accuracy following dem oand cuing. Patient is demonstrating increased tone/spasticity in RLE>LLE. PT will continue to work toward postural restoration and proper length tension relationship as able. Reports 5/10 pain at the end of session    Stability/Clinical Decision Making  Evolving/Moderate complexity    Rehab Potential  Fair    Clinical Impairments Affecting Rehab Potential  comorbidities, symptom duration, curvature degree    PT Frequency  2x / week    PT Duration  8 weeks    PT Treatment/Interventions  ADLs/Self Care Home Management;Aquatic Therapy;Cryotherapy;Ultrasound;Parrafin;Traction;Moist Heat;Electrical Stimulation;DME Instruction;Gait training;Stair training;Functional mobility training;Neuromuscular re-education;Balance training;Therapeutic exercise;Therapeutic activities;Patient/family education;Energy conservation;Spinal Manipulations;Joint Manipulations;Passive range of motion;Dry  needling;Manual techniques    PT Next Visit Plan   hip flexor lengthening/glute strengthening, core control, attempt bridge    PT Home Exercise Plan  seated thoracic ext, see pt instruction section: diaphragmatic breathing in supine, supine R SB and lat stretch    Consulted and Agree with Plan of Care  Patient       Patient will benefit from skilled therapeutic intervention in order to improve the following deficits and impairments:  Abnormal gait, Decreased balance, Decreased endurance, Decreased mobility, Difficulty walking, Hypomobility, Increased muscle spasms, Decreased range of motion, Pain, Postural dysfunction, Impaired flexibility, Increased fascial restricitons, Decreased strength, Decreased activity tolerance  Visit Diagnosis: 1. Other idiopathic scoliosis, lumbar region   2. Radiculopathy, lumbar region        Problem List Patient Active Problem List   Diagnosis Date Noted  . Central sleep apnea 10/09/2016  . Hypertension 10/09/2016  . Lumbar postlaminectomy syndrome 10/09/2016  . Lumbar radiculopathy 10/09/2016  . Diabetic polyneuropathy ([place]) 10/07/2016  . Right lumbosacral radiculopathy 10/07/2016  . Healthcare maintenance 08/26/2016  . DKA (diabetic ketoacidoses) ([place]) 06/14/2016  . Atypical chest pain 02/23/2016  . SOB (shortness of breath) 02/23/2016  . Pain medication agreement signed 01/13/2015  . Acquired hypothyroidism 05/30/2014  . Frontal sinusitis 12/17/2013  . Atrial fibrillation ([place]) 11/29/2013  . Major depression in remission ([place]) 11/20/2013  . 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 Chronic pain syndrome 08/27/2013  . Back pain 07/17/2013  . HTN (hypertension), benign 07/17/2013  . Hyperlipidemia 07/17/2013  . Pancreatic insufficiency 07/17/2013  . Chronic  postoperative pain 01/01/2013  . Long term current use of opiate analgesic 11/04/2012  . Hypothyroidism 08/25/2012  . Radiculopathy of leg  05/01/2011   Shelton Silvas PT, DPT Shelton Silvas 08/07/2018, 10:42 AM  [facility] [address], [place], [address] Phone: [phone]   Fax:  [phone]  Name: NATALEAH SCIONEAUX MRN: [id number] Date of Birth: 03/08/1950

## 2018-08-10 ENCOUNTER — Encounter: Payer: Self-pay | Admitting: Physical Therapy

## 2018-08-10 ENCOUNTER — Ambulatory Visit: Payer: Medicare Other | Admitting: Physical Therapy

## 2018-08-10 ENCOUNTER — Other Ambulatory Visit: Payer: Self-pay

## 2018-08-10 DIAGNOSIS — M4126 Other idiopathic scoliosis, lumbar region: Secondary | ICD-10-CM | POA: Diagnosis not present

## 2018-08-10 DIAGNOSIS — M5416 Radiculopathy, lumbar region: Secondary | ICD-10-CM

## 2018-08-10 NOTE — Therapy (Signed)
[facility] [address], [place], [address] Phone: [phone]   Fax:  [phone]  Physical Therapy Treatment  Patient Details  Name: Colleen Payne MRN: [id number] Date of Birth: 23-Oct-1950 Referring Provider (PT): Traci Sermon MD   Encounter Date: 08/10/2018    Past Medical History:  Diagnosis Date  . Anxiety   . Atrial fibrillation ([place])   . Depression   . Diabetes mellitus without complication (HCC)    Type I   . Headache    stress. 1x/month  . Hypertension   . Insulin pump in place   . Motion sickness    cars  . MVP (mitral valve prolapse)   . Neuromuscular disorder (HCC)    leg weakness s/p back surgeries  . Thyroid disease     Past Surgical History:  Procedure Laterality Date  . BACK SURGERY  2004   3 sugeries between Sept and Nov, fusion L2-S1  . CATARACT EXTRACTION W/PHACO Left 11/23/2014   Procedure: CATARACT EXTRACTION PHACO AND INTRAOCULAR LENS PLACEMENT (IOC);  Surgeon: Leandrew Koyanagi, MD;  Location: [place];  Service: Ophthalmology;  Laterality: Left;  DIABETIC - insulin pump    There were no vitals filed for this visit.  Subjective Assessment - 08/10/18 0922    Subjective  Reports she was pretty sore following last session on Saturday. Reprots 7/10 pain today.    Pertinent History  Patient is 68 yo female that complains of lumbar pain and inability to walk erect, as well as hip/abdominal pain. Patient with history of lumbar fusion (L2-S1). Xray results show: Fairly stable appearance of the levoscoliosis of the lower thoracic and of the lumbar spine.Stable lateral subluxation toward the left of L2 with respect L3. Stable partial compression of the body of L1. Patient complains of frustration with physicians, and that her symptoms are worsening over time. 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 Reported that her  pain and difficulty worsened after trying to pick her husband up about 1 year ago. Patient mentioned that she has been told that she has "spinal fluid pockets that are leaking". PMH also includes DM I with insulin pump,  afib, HTN,  HLD, chronic pain syndrome, SOB.    Limitations  Standing;Walking;House hold activities;Other (comment)    Diagnostic tests  see exray results: Fairly stable appearance of the levoscoliosis of the lower thoracic and lumbar spine. Stable lateral subluxation toward left of L2 with respect of L3, stable partial compression of the body of L1.    Patient Stated Goals  pain relief, standing erect         Ther-Ex -bilat modified thomas stretchsupine bilat knee flex > stretch x10sec hold x12 sets - Pt reports she is unable to complete step gastroc stretch at home d/t not being able to leave her husband to go to her outside steps, modified to standing gastroc stretch 3x 10sec hold each side - Gastroc stair stretch 14min hold - Standing bilat DF with pt leaning on table with some noted activaiton; modified to seated bilat DF x10 with RUE  - OMEGA rows 10# 3x 10 in stagger stance with RLE back for gastroc stretch; stabilization from PT at hips to prevent LOB - Standing ext over table 3x 10 with difficulty maintaining R knee ext, with some heel lift - R TKE RTB 3x 10 with cuing to maintain hip ext with knee ext + DF with some success -  Goodmornings with PVC pipe outstretched to encourage neutral spine/lengthening 3x 10 with some cuing for porper form with good carry over following                           PT Short Term Goals - 07/27/18 0933      PT SHORT TERM GOAL #1   Title  Patient will be adherent to initial HEP at least 3x a week to improve functional strength and balance for better safety at home.    Time  4    Period  Weeks    Status  Achieved    Target Date  12/17/17        PT Long Term Goals - 07/31/18 1006      PT LONG TERM GOAL #1    Title   Patient will be independent in bending down towards floor and picking up small object (<5 pounds) and then stand back up with pain 5/10 or less to improve ability to pick up and clean up room at home    Baseline  08/10/18 8/10 with "all motion", able to pick up object    Time  8    Period  Weeks    Status  On-going      PT LONG TERM GOAL #2   Title  Patient will increase BLE gross strength to 4+/5 as to improve functional strength for independent gait, increased standing tolerance and increased ADL ability.    Baseline  03/18/18 R/L flexion: 4+/4+ ER 4+/4 IR 5/5 ABD 4+/5 Ext 4/4    Time  8    Period  Weeks    Status  On-going      PT LONG TERM GOAL #3   Title  Patient will report a worst pain of 5/10 with transfers of her husband to improve tolerance with ADLs     Baseline  07/31/18 8-10/10 pain with transfers    Time  8    Period  Weeks    Status  On-going      PT LONG TERM GOAL #4   Title  Pt will decrease 5TSTS by at least 3 seconds in order to demonstrate clinically significant improvement in LE strength    Baseline  07/31/18 13sec 07/20/18 15sec 06/10/18 16sec    Time  8    Period  Days    Status  Achieved      PT LONG TERM GOAL #5   Title  Pt will increase 10MWT by at least 0.13 m/s in order to demonstrate clinically significant improvement in community ambulation    Baseline  07/31/18 0.71m/s 07/20/18 same 06/10/18 0.31m/s    Time  8    Period  Weeks    Status  On-going      PT LONG TERM GOAL #7   Title  Patient will increase standing tolerance to 5min without increased pain in order to complete household chores/cooking    Baseline  07/31/18 Can stand for 61mins with 8/10 pain    Time  8    Period  Weeks    Status  On-going              Patient will benefit from skilled therapeutic intervention in order to improve the following deficits and impairments:     Visit Diagnosis: No diagnosis found.     Problem List Patient Active Problem List    Diagnosis Date Noted  . Central sleep apnea 10/09/2016  . Hypertension 10/09/2016  .  Lumbar postlaminectomy syndrome 10/09/2016  . Lumbar radiculopathy 10/09/2016  . Diabetic polyneuropathy ([place]) 10/07/2016  . Right lumbosacral radiculopathy 10/07/2016  . Healthcare maintenance 08/26/2016  . DKA (diabetic ketoacidoses) ([place]) 06/14/2016  . Atypical chest pain 02/23/2016  . SOB (shortness of breath) 02/23/2016  . Pain medication agreement signed 01/13/2015  . Acquired hypothyroidism 05/30/2014  . Frontal sinusitis 12/17/2013  . Atrial fibrillation ([place]) 11/29/2013  . Major depression in remission ([place]) 11/20/2013  . 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 Chronic pain syndrome 08/27/2013  . Back pain 07/17/2013  . HTN (hypertension), benign 07/17/2013  . Hyperlipidemia 07/17/2013  . Pancreatic insufficiency 07/17/2013  . Chronic postoperative pain 01/01/2013  . Long term current use of opiate analgesic 11/04/2012  . Hypothyroidism 08/25/2012  . Radiculopathy of leg 05/01/2011   Shelton Silvas PT, DPT Shelton Silvas 08/10/2018, 9:23 AM  [facility] [address] [place], [address] Phone: [phone]   Fax:  [phone]  Name: Colleen Payne MRN: [id number] Date of Birth: 02-18-1950

## 2018-08-12 ENCOUNTER — Encounter: Payer: Medicare Other | Admitting: Physical Therapy

## 2018-08-14 ENCOUNTER — Ambulatory Visit: Payer: Medicare Other | Admitting: Physical Therapy

## 2018-08-14 ENCOUNTER — Other Ambulatory Visit: Payer: Self-pay

## 2018-08-14 ENCOUNTER — Encounter: Payer: Self-pay | Admitting: Physical Therapy

## 2018-08-14 DIAGNOSIS — M4126 Other idiopathic scoliosis, lumbar region: Secondary | ICD-10-CM

## 2018-08-14 DIAGNOSIS — M5416 Radiculopathy, lumbar region: Secondary | ICD-10-CM

## 2018-08-14 NOTE — Therapy (Signed)
[facility] [address], [place], [address] Phone: [phone]   Fax:  [phone]  Physical Therapy Treatment  Patient Details  Name: Colleen Payne MRN: [id number] Date of Birth: Sep 14, 1950 Referring Provider (PT): Traci Sermon MD   Encounter Date: 08/14/2018  PT End of Session - 08/14/18 1017    Visit Number  17    Number of Visits  33    Date for PT Re-Evaluation  09/25/18    Authorization Type  Medicare    Authorization Time Period  5/10    PT Start Time  1000    PT Stop Time  1045    PT Time Calculation (min)  45 min    Activity Tolerance  Patient tolerated treatment well    Behavior During Therapy  [facility] for tasks assessed/performed       Past Medical History:  Diagnosis Date  . Anxiety   . Atrial fibrillation ([place])   . Depression   . Diabetes mellitus without complication (HCC)    Type I   . Headache    stress. 1x/month  . Hypertension   . Insulin pump in place   . Motion sickness    cars  . MVP (mitral valve prolapse)   . Neuromuscular disorder (HCC)    leg weakness s/p back surgeries  . Thyroid disease     Past Surgical History:  Procedure Laterality Date  . BACK SURGERY  2004   3 sugeries between Sept and Nov, fusion L2-S1  . CATARACT EXTRACTION W/PHACO Left 11/23/2014   Procedure: CATARACT EXTRACTION PHACO AND INTRAOCULAR LENS PLACEMENT (IOC);  Surgeon: Leandrew Koyanagi, MD;  Location: [place];  Service: Ophthalmology;  Laterality: Left;  DIABETIC - insulin pump    There were no vitals filed for this visit.  Subjective Assessment - 08/14/18 1013    Subjective  Reports she felt good following last session. 7/10 pain today.    Pertinent History  Patient is 68 yo female that complains of lumbar pain and inability to walk erect, as well as hip/abdominal pain. Patient with history of lumbar fusion (L2-S1). Xray results show: Fairly stable appearance of the  levoscoliosis of the lower thoracic and of the lumbar spine.Stable lateral subluxation toward the left of L2 with respect L3. Stable partial compression of the body of L1. Patient complains of frustration with physicians, and that her symptoms are worsening over time. 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 Reported that her pain and difficulty worsened after trying to pick her husband up about 1 year ago. Patient mentioned that she has been told that she has "spinal fluid pockets that are leaking". PMH also includes DM I with insulin pump,  afib, HTN,  HLD, chronic pain syndrome, SOB.    Limitations  Standing;Walking;House hold activities;Other (comment)    Diagnostic tests  see exray results: Fairly stable appearance of the levoscoliosis of the lower thoracic and lumbar spine. Stable lateral subluxation toward left of L2 with respect of L3, stable partial compression of the body of L1.    Patient Stated Goals  pain relief, standing erect       Ther-Ex -bilat modified thomas stretchsupine bilat knee flex > stretch x10sec hold x12 sets - Half kneeling hip flexor stretch x10 10sec hold; PT assisting arm overhead and needing heavy stabilization at the hip to prevent LOB, esp with RLE stretch - Thoracic ext on mat table with  cuing to try to keep R heel down and R knee extended - TRX pull up 3x 10 with cuing for hip ext with "lean back" with good carry over, stepping forward each set to increase difficulty - Mini squat with UE support and chair behind for safety 3x 10 with demo and max cuing initially for proper form with good carry over following - Goodmornings with PVC pipe outstretched to encourage neutral spine/lengthening 3x 10 with some cuing for porper form with good carry over following - Step gastroc stretch 3x 30sec hold                        PT Education - 08/14/18 1016    Education Details  therex form    Person(s)  Educated  Patient    Methods  Explanation;Demonstration;Tactile cues;Verbal cues    Comprehension  Verbalized understanding;Returned demonstration;Verbal cues required;Tactile cues required       PT Short Term Goals - 07/27/18 0933      PT SHORT TERM GOAL #1   Title  Patient will be adherent to initial HEP at least 3x a week to improve functional strength and balance for better safety at home.    Time  4    Period  Weeks    Status  Achieved    Target Date  12/17/17        PT Long Term Goals - 07/31/18 1006      PT LONG TERM GOAL #1   Title   Patient will be independent in bending down towards floor and picking up small object (<5 pounds) and then stand back up with pain 5/10 or less to improve ability to pick up and clean up room at home    Baseline  08/10/18 8/10 with "all motion", able to pick up object    Time  8    Period  Weeks    Status  On-going      PT LONG TERM GOAL #2   Title  Patient will increase BLE gross strength to 4+/5 as to improve functional strength for independent gait, increased standing tolerance and increased ADL ability.    Baseline  03/18/18 R/L flexion: 4+/4+ ER 4+/4 IR 5/5 ABD 4+/5 Ext 4/4    Time  8    Period  Weeks    Status  On-going      PT LONG TERM GOAL #3   Title  Patient will report a worst pain of 5/10 with transfers of her husband to improve tolerance with ADLs     Baseline  07/31/18 8-10/10 pain with transfers    Time  8    Period  Weeks    Status  On-going      PT LONG TERM GOAL #4   Title  Pt will decrease 5TSTS by at least 3 seconds in order to demonstrate clinically significant improvement in LE strength    Baseline  07/31/18 13sec 07/20/18 15sec 06/10/18 16sec    Time  8    Period  Days    Status  Achieved      PT LONG TERM GOAL #5   Title  Pt will increase 10MWT by at least 0.13 m/s in order to demonstrate clinically significant improvement in community ambulation    Baseline  07/31/18 0.72m/s 07/20/18 same 06/10/18 0.68m/s    Time   8    Period  Weeks    Status  On-going      PT LONG TERM GOAL #  7   Title  Patient will increase standing tolerance to 68min without increased pain in order to complete household chores/cooking    Baseline  07/31/18 Can stand for 44mins with 8/10 pain    Time  8    Period  Weeks    Status  On-going            Plan - 08/14/18 1025    Clinical Impression Statement  PT continued therex progression and standing tolerance. Patient continues to have decent carry over of proper posture and therex techniques between sessions, with some cuing needed with good caorrection. Patietn reports minimal pain following, and is pleased with with. PT will continue progression as able.    Stability/Clinical Decision Making  Evolving/Moderate complexity    Clinical Decision Making  Moderate    Rehab Potential  Fair    PT Frequency  2x / week    PT Duration  8 weeks    PT Treatment/Interventions  ADLs/Self Care Home Management;Aquatic Therapy;Cryotherapy;Ultrasound;Parrafin;Traction;Moist Heat;Electrical Stimulation;DME Instruction;Gait training;Stair training;Functional mobility training;Neuromuscular re-education;Balance training;Therapeutic exercise;Therapeutic activities;Patient/family education;Energy conservation;Spinal Manipulations;Joint Manipulations;Passive range of motion;Dry needling;Manual techniques    PT Next Visit Plan   hip flexor lengthening/glute strengthening, core control, attempt bridge    PT Home Exercise Plan  seated thoracic ext, see pt instruction section: diaphragmatic breathing in supine, supine R SB and lat stretch    Consulted and Agree with Plan of Care  Patient       Patient will benefit from skilled therapeutic intervention in order to improve the following deficits and impairments:  Abnormal gait, Decreased balance, Decreased endurance, Decreased mobility, Difficulty walking, Hypomobility, Increased muscle spasms, Decreased range of motion, Pain, Postural dysfunction,  Impaired flexibility, Increased fascial restricitons, Decreased strength, Decreased activity tolerance  Visit Diagnosis: 1. Other idiopathic scoliosis, lumbar region   2. Radiculopathy, lumbar region        Problem List Patient Active Problem List   Diagnosis Date Noted  . Central sleep apnea 10/09/2016  . Hypertension 10/09/2016  . Lumbar postlaminectomy syndrome 10/09/2016  . Lumbar radiculopathy 10/09/2016  . Diabetic polyneuropathy ([place]) 10/07/2016  . Right lumbosacral radiculopathy 10/07/2016  . Healthcare maintenance 08/26/2016  . DKA (diabetic ketoacidoses) ([place]) 06/14/2016  . Atypical chest pain 02/23/2016  . SOB (shortness of breath) 02/23/2016  . Pain medication agreement signed 01/13/2015  . Acquired hypothyroidism 05/30/2014  . Frontal sinusitis 12/17/2013  . Atrial fibrillation ([place]) 11/29/2013  . Major depression in remission ([place]) 11/20/2013  . 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 Chronic pain syndrome 08/27/2013  . Back pain 07/17/2013  . HTN (hypertension), benign 07/17/2013  . Hyperlipidemia 07/17/2013  . Pancreatic insufficiency 07/17/2013  . Chronic postoperative pain 01/01/2013  . Long term current use of opiate analgesic 11/04/2012  . Hypothyroidism 08/25/2012  . Radiculopathy of leg 05/01/2011   Shelton Silvas PT, DPT Shelton Silvas 08/14/2018, 10:46 AM  [facility] [address] [place], [address] Phone: [phone]   Fax:  [phone]  Name: Colleen Payne MRN: [id number] Date of Birth: 1950/12/01

## 2018-08-17 ENCOUNTER — Ambulatory Visit: Payer: Medicare Other | Admitting: Physical Therapy

## 2018-08-17 ENCOUNTER — Other Ambulatory Visit: Payer: Self-pay

## 2018-08-17 ENCOUNTER — Encounter: Payer: Self-pay | Admitting: Physical Therapy

## 2018-08-17 DIAGNOSIS — M4126 Other idiopathic scoliosis, lumbar region: Secondary | ICD-10-CM

## 2018-08-17 DIAGNOSIS — M5416 Radiculopathy, lumbar region: Secondary | ICD-10-CM

## 2018-08-17 NOTE — Therapy (Signed)
[facility] [address] [place], [address] Phone: [phone]   Fax:  [phone]  Physical Therapy Treatment  Patient Details  Name: Colleen Payne MRN: [id number] Date of Birth: [birth year] Referring Provider (PT): Traci Sermon MD   Encounter Date: 08/17/2018  PT End of Session - 08/17/18 1002    Activity Tolerance  Patient tolerated treatment well    Behavior During Therapy  [facility] for tasks assessed/performed       Past Medical History:  Diagnosis Date  . Anxiety   . Atrial fibrillation ([place])   . Depression   . Diabetes mellitus without complication (HCC)    Type I   . Headache    stress. 1x/month  . Hypertension   . Insulin pump in place   . Motion sickness    cars  . MVP (mitral valve prolapse)   . Neuromuscular disorder (HCC)    leg weakness s/p back surgeries  . Thyroid disease     Past Surgical History:  Procedure Laterality Date  . BACK SURGERY  2004   3 sugeries between Sept and Nov, fusion L2-S1  . CATARACT EXTRACTION W/PHACO Left 11/23/2014   Procedure: CATARACT EXTRACTION PHACO AND INTRAOCULAR LENS PLACEMENT (IOC);  Surgeon: Leandrew Koyanagi, MD;  Location: [place];  Service: Ophthalmology;  Laterality: Left;  DIABETIC - insulin pump    There were no vitals filed for this visit.  Subjective Assessment - 08/17/18 0954    Subjective  Pt reports she is having back pain today...."it's not good." 7/10    Pertinent History  Patient is 68 yo female that complains of lumbar pain and inability to walk erect, as well as hip/abdominal pain. Patient with history of lumbar fusion (L2-S1). Xray results show: Fairly stable appearance of the levoscoliosis of the lower thoracic and of the lumbar spine.Stable lateral subluxation toward the left of L2 with respect L3. Stable partial compression of the body of L1. Patient complains of frustration with physicians, and that her  symptoms are worsening over time. 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 Reported that her pain and difficulty worsened after trying to pick her husband up about 1 year ago. Patient mentioned that she has been told that she has "spinal fluid pockets that are leaking". PMH also includes DM I with insulin pump,  afib, HTN,  HLD, chronic pain syndrome, SOB.    Limitations  Standing;Walking;House hold activities;Other (comment)    Diagnostic tests  see exray results: Fairly stable appearance of the levoscoliosis of the lower thoracic and lumbar spine. Stable lateral subluxation toward left of L2 with respect of L3, stable partial compression of the body of L1.    Patient Stated Goals  pain relief, standing erect    Currently in Pain?  Yes    Pain Score  7     Pain Location  Back    Pain Type  Chronic pain    Pain Onset  More than a month ago    Pain Frequency  Constant        Treatment:    Therapeutic Exercise:  Modified Thomas Stretch B 5x30 sec on black mat edge; 2x10 bridging with green ball between knees and mild hip add squeeze; hooklying clams with ab bracing RTB 2x10; LTR "knee flops" on mat 5x10 sec B; standing heel raises with B UE support 2x10; 3x30 sec B gastroc stretch on step; Door Pec stretch  3x30 sec; standing B hip / and hip Abd at plinth with UE support 2x10 each B; Rows with RTB 2x10; standing trunk extension with UE support to neutral with verbal cueing 2x10.                       PT Education - 08/17/18 1002    Person(s) Educated  Patient    Methods  Explanation;Demonstration    Comprehension  Returned demonstration       PT Short Term Goals - 07/27/18 0933      PT SHORT TERM GOAL #1   Title  Patient will be adherent to initial HEP at least 3x a week to improve functional strength and balance for better safety at home.    Time  4    Period  Weeks    Status  Achieved    Target Date  12/17/17         PT Long Term Goals - 07/31/18 1006      PT LONG TERM GOAL #1   Title   Patient will be independent in bending down towards floor and picking up small object (<5 pounds) and then stand back up with pain 5/10 or less to improve ability to pick up and clean up room at home    Baseline  08/10/18 8/10 with "all motion", able to pick up object    Time  8    Period  Weeks    Status  On-going      PT LONG TERM GOAL #2   Title  Patient will increase BLE gross strength to 4+/5 as to improve functional strength for independent gait, increased standing tolerance and increased ADL ability.    Baseline  03/18/18 R/L flexion: 4+/4+ ER 4+/4 IR 5/5 ABD 4+/5 Ext 4/4    Time  8    Period  Weeks    Status  On-going      PT LONG TERM GOAL #3   Title  Patient will report a worst pain of 5/10 with transfers of her husband to improve tolerance with ADLs     Baseline  07/31/18 8-10/10 pain with transfers    Time  8    Period  Weeks    Status  On-going      PT LONG TERM GOAL #4   Title  Pt will decrease 5TSTS by at least 3 seconds in order to demonstrate clinically significant improvement in LE strength    Baseline  07/31/18 13sec 07/20/18 15sec 06/10/18 16sec    Time  8    Period  Days    Status  Achieved      PT LONG TERM GOAL #5   Title  Pt will increase 10MWT by at least 0.13 m/s in order to demonstrate clinically significant improvement in community ambulation    Baseline  07/31/18 0.51m/s 07/20/18 same 06/10/18 0.81m/s    Time  8    Period  Weeks    Status  On-going      PT LONG TERM GOAL #7   Title  Patient will increase standing tolerance to 45min without increased pain in order to complete household chores/cooking    Baseline  07/31/18 Can stand for 41mins with 8/10 pain    Time  8    Period  Weeks    Status  On-going            Plan - 08/17/18 1002    Clinical Impression Statement  Pt verbalized her frustration  over the fact that she still has difficulty standing erect and feels  like it's not improving very quickly.  She was fatigued with standing lumbar extension ex's during session today.  Continue to work on core, hip and trunk strengthening at next visit.    Stability/Clinical Decision Making  Evolving/Moderate complexity    Clinical Decision Making  Moderate    Rehab Potential  Fair    Clinical Impairments Affecting Rehab Potential  comorbidities, symptom duration, curvature degree    PT Frequency  2x / week    PT Duration  8 weeks    PT Treatment/Interventions  ADLs/Self Care Home Management;Aquatic Therapy;Cryotherapy;Ultrasound;Parrafin;Traction;Moist Heat;Electrical Stimulation;DME Instruction;Gait training;Stair training;Functional mobility training;Neuromuscular re-education;Balance training;Therapeutic exercise;Therapeutic activities;Patient/family education;Energy conservation;Spinal Manipulations;Joint Manipulations;Passive range of motion;Dry needling;Manual techniques    PT Next Visit Plan  Progress Hip and core strengthening as tolerated    PT Home Exercise Plan  Added standing pec stretch in doorway to HEP today    Consulted and Agree with Plan of Care  Patient       Patient will benefit from skilled therapeutic intervention in order to improve the following deficits and impairments:  Abnormal gait, Decreased balance, Decreased endurance, Decreased mobility, Difficulty walking, Hypomobility, Increased muscle spasms, Decreased range of motion, Pain, Postural dysfunction, Impaired flexibility, Increased fascial restricitons, Decreased strength, Decreased activity tolerance  Visit Diagnosis: 1. Other idiopathic scoliosis, lumbar region   2. Radiculopathy, lumbar region        Problem List Patient Active Problem List   Diagnosis Date Noted  . Central sleep apnea 10/09/2016  . Hypertension 10/09/2016  . Lumbar postlaminectomy syndrome 10/09/2016  . Lumbar radiculopathy 10/09/2016  . Diabetic polyneuropathy ([place]) 10/07/2016  . Right lumbosacral  radiculopathy 10/07/2016  . Healthcare maintenance 08/26/2016  . DKA (diabetic ketoacidoses) ([place]) 06/14/2016  . Atypical chest pain 02/23/2016  . SOB (shortness of breath) 02/23/2016  . Pain medication agreement signed 01/13/2015  . Acquired hypothyroidism 05/30/2014  . Frontal sinusitis 12/17/2013  . Atrial fibrillation ([place]) 11/29/2013  . Major depression in remission ([place]) 11/20/2013  . 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 Chronic pain syndrome 08/27/2013  . Back pain 07/17/2013  . HTN (hypertension), benign 07/17/2013  . Hyperlipidemia 07/17/2013  . Pancreatic insufficiency 07/17/2013  . Chronic postoperative pain 01/01/2013  . Long term current use of opiate analgesic 11/04/2012  . Hypothyroidism 08/25/2012  . Radiculopathy of leg 05/01/2011    Cylan Borum, MPT 08/17/2018, 10:09 AM  [facility] [address], [place], [address] Phone: [phone]   Fax:  [phone]  Name: TENLEE WOLLIN MRN: [id number] Date of Birth: [birth year]

## 2018-08-19 ENCOUNTER — Encounter: Payer: Medicare Other | Admitting: Physical Therapy

## 2018-08-21 ENCOUNTER — Other Ambulatory Visit: Payer: Self-pay

## 2018-08-21 ENCOUNTER — Encounter: Payer: Self-pay | Admitting: Physical Therapy

## 2018-08-21 ENCOUNTER — Ambulatory Visit: Payer: Medicare Other | Admitting: Physical Therapy

## 2018-08-21 DIAGNOSIS — M4126 Other idiopathic scoliosis, lumbar region: Secondary | ICD-10-CM | POA: Diagnosis not present

## 2018-08-21 DIAGNOSIS — M5416 Radiculopathy, lumbar region: Secondary | ICD-10-CM

## 2018-08-21 NOTE — Therapy (Signed)
[facility] [address], [place], [address] Phone: [phone]   Fax:  [phone]  Physical Therapy Treatment  Patient Details  Name: Colleen Payne MRN: [id number] Date of Birth: 05-15-1950 Referring Provider (PT): Traci Sermon MD   Encounter Date: 08/21/2018  PT End of Session - 08/21/18 1025    Visit Number  19    Number of Visits  33    Date for PT Re-Evaluation  09/25/18    Authorization Type  Medicare    Authorization Time Period  7/10    PT Start Time  1000    PT Stop Time  1045    PT Time Calculation (min)  45 min    Activity Tolerance  Patient tolerated treatment well    Behavior During Therapy  [facility] for tasks assessed/performed       Past Medical History:  Diagnosis Date  . Anxiety   . Atrial fibrillation ([place])   . Depression   . Diabetes mellitus without complication (HCC)    Type I   . Headache    stress. 1x/month  . Hypertension   . Insulin pump in place   . Motion sickness    cars  . MVP (mitral valve prolapse)   . Neuromuscular disorder (HCC)    leg weakness s/p back surgeries  . Thyroid disease     Past Surgical History:  Procedure Laterality Date  . BACK SURGERY  2004   3 sugeries between Sept and Nov, fusion L2-S1  . CATARACT EXTRACTION W/PHACO Left 11/23/2014   Procedure: CATARACT EXTRACTION PHACO AND INTRAOCULAR LENS PLACEMENT (IOC);  Surgeon: Leandrew Koyanagi, MD;  Location: [facility];  Service: Ophthalmology;  Laterality: Left;  DIABETIC - insulin pump    There were no vitals filed for this visit.  Subjective Assessment - 08/21/18 1003    Subjective  Pt reports she is sore today, from having a fall Tuesday. She reports she walked out her front door and fell on her step onto her behind. Denies hitting head, LOC, or bruising. Reports some increased soreness and 8/10 pain in the LB since then    Pertinent History  Patient is 68 yo female that  complains of lumbar pain and inability to walk erect, as well as hip/abdominal pain. Patient with history of lumbar fusion (L2-S1). Xray results show: Fairly stable appearance of the levoscoliosis of the lower thoracic and of the lumbar spine.Stable lateral subluxation toward the left of L2 with respect L3. Stable partial compression of the body of L1. Patient complains of frustration with physicians, and that her symptoms are worsening over time. 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 Reported that her pain and difficulty worsened after trying to pick her husband up about 1 year ago. Patient mentioned that she has been told that she has "spinal fluid pockets that are leaking". PMH also includes DM I with insulin pump,  afib, HTN,  HLD, chronic pain syndrome, SOB.    Limitations  Standing;Walking;House hold activities;Other (comment)    Diagnostic tests  see exray results: Fairly stable appearance of the levoscoliosis of the lower thoracic and lumbar spine. Stable lateral subluxation toward left of L2 with respect of L3, stable partial compression of the body of L1.    Patient Stated Goals  pain relief, standing erect    Pain Onset  More than a month ago       Ther-Ex -bilat  modified thomas stretchsupine bilat knee flex > stretch x10sec hold x10 - LB traction with physioball 10sec 10x  - Hooklying lower trunk rotations x10 each direction, cuing initially for set up without excessive pelvic rotation - Prone prop 90sec with increase space between ASIS and mat table than previous sessions - Prone press up 2x 10 with min cuing for form initially, good carry over following - Cat cow x5; with breath control x5 demo and heavy cuing initially for proper form with good carry over following - Thread the needle x5 each side, TC for tspine rotation with good carry over following - Thoracic ext on mat table with TC to maintain R heel down and R knee TKE - OMEGA  knee ext 15# 3x 10 cuing for eccentric  - TRX pull up 3x 10 with cuing for hip ext with "lean back" with good carry over from previous sessions                        PT Education - 08/21/18 1024    Education Details  therex form    Person(s) Educated  Patient    Methods  Explanation;Demonstration;Tactile cues;Verbal cues    Comprehension  Verbalized understanding;Returned demonstration;Verbal cues required;Tactile cues required       PT Short Term Goals - 07/27/18 0933      PT SHORT TERM GOAL #1   Title  Patient will be adherent to initial HEP at least 3x a week to improve functional strength and balance for better safety at home.    Time  4    Period  Weeks    Status  Achieved    Target Date  12/17/17        PT Long Term Goals - 07/31/18 1006      PT LONG TERM GOAL #1   Title   Patient will be independent in bending down towards floor and picking up small object (<5 pounds) and then stand back up with pain 5/10 or less to improve ability to pick up and clean up room at home    Baseline  08/10/18 8/10 with "all motion", able to pick up object    Time  8    Period  Weeks    Status  On-going      PT LONG TERM GOAL #2   Title  Patient will increase BLE gross strength to 4+/5 as to improve functional strength for independent gait, increased standing tolerance and increased ADL ability.    Baseline  03/18/18 R/L flexion: 4+/4+ ER 4+/4 IR 5/5 ABD 4+/5 Ext 4/4    Time  8    Period  Weeks    Status  On-going      PT LONG TERM GOAL #3   Title  Patient will report a worst pain of 5/10 with transfers of her husband to improve tolerance with ADLs     Baseline  07/31/18 8-10/10 pain with transfers    Time  8    Period  Weeks    Status  On-going      PT LONG TERM GOAL #4   Title  Pt will decrease 5TSTS by at least 3 seconds in order to demonstrate clinically significant improvement in LE strength    Baseline  07/31/18 13sec 07/20/18 15sec 06/10/18 16sec    Time  8     Period  Days    Status  Achieved      PT LONG TERM GOAL #5  Title  Pt will increase 10MWT by at least 0.13 m/s in order to demonstrate clinically significant improvement in community ambulation    Baseline  07/31/18 0.1m/s 07/20/18 same 06/10/18 0.60m/s    Time  8    Period  Weeks    Status  On-going      PT LONG TERM GOAL #7   Title  Patient will increase standing tolerance to 70min without increased pain in order to complete household chores/cooking    Baseline  07/31/18 Can stand for 28mins with 8/10 pain    Time  8    Period  Weeks    Status  On-going            Plan - 08/21/18 1030    Clinical Impression Statement  PT utilized therex for increased mobility and pain reduction this session, with good success (reports 6/10 at end of session). Patient requires some cuing for proper technique, but is demonstrating good self correction between sessions as well. Continuing to have difficulty with combined hip and knee ext with DF, but is attempting muscle recruitment to correct this, which is an improvement. PT will continue progression as able.    Stability/Clinical Decision Making  Evolving/Moderate complexity    Clinical Decision Making  Moderate    Rehab Potential  Fair    Clinical Impairments Affecting Rehab Potential  comorbidities, symptom duration, curvature degree    PT Frequency  2x / week    PT Duration  8 weeks    PT Treatment/Interventions  ADLs/Self Care Home Management;Aquatic Therapy;Cryotherapy;Ultrasound;Parrafin;Traction;Moist Heat;Electrical Stimulation;DME Instruction;Gait training;Stair training;Functional mobility training;Neuromuscular re-education;Balance training;Therapeutic exercise;Therapeutic activities;Patient/family education;Energy conservation;Spinal Manipulations;Joint Manipulations;Passive range of motion;Dry needling;Manual techniques    PT Next Visit Plan  Progress Hip and core strengthening as tolerated    PT Home Exercise Plan  Added standing  pec stretch in doorway to HEP today    Consulted and Agree with Plan of Care  Patient       Patient will benefit from skilled therapeutic intervention in order to improve the following deficits and impairments:  Abnormal gait, Decreased balance, Decreased endurance, Decreased mobility, Difficulty walking, Hypomobility, Increased muscle spasms, Decreased range of motion, Pain, Postural dysfunction, Impaired flexibility, Increased fascial restricitons, Decreased strength, Decreased activity tolerance  Visit Diagnosis: 1. Other idiopathic scoliosis, lumbar region   2. Radiculopathy, lumbar region        Problem List Patient Active Problem List   Diagnosis Date Noted  . Central sleep apnea 10/09/2016  . Hypertension 10/09/2016  . Lumbar postlaminectomy syndrome 10/09/2016  . Lumbar radiculopathy 10/09/2016  . Diabetic polyneuropathy ([place]) 10/07/2016  . Right lumbosacral radiculopathy 10/07/2016  . Healthcare maintenance 08/26/2016  . DKA (diabetic ketoacidoses) ([place]) 06/14/2016  . Atypical chest pain 02/23/2016  . SOB (shortness of breath) 02/23/2016  . Pain medication agreement signed 01/13/2015  . Acquired hypothyroidism 05/30/2014  . Frontal sinusitis 12/17/2013  . Atrial fibrillation ([place]) 11/29/2013  . Major depression in remission ([place]) 11/20/2013  . 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 Chronic pain syndrome 08/27/2013  . Back pain 07/17/2013  . HTN (hypertension), benign 07/17/2013  . Hyperlipidemia 07/17/2013  . Pancreatic insufficiency 07/17/2013  . Chronic postoperative pain 01/01/2013  . Long term current use of opiate analgesic 11/04/2012  . Hypothyroidism 08/25/2012  . Radiculopathy of leg 05/01/2011   Shelton Silvas PT, DPT Shelton Silvas 08/21/2018, 10:44 AM  [facility] [address] [place],  [place], [address] Phone: [phone]   Fax:   [phone]  Name: ERYCA BOLTE MRN: [id number] Date of Birth: 07/01/1950

## 2018-08-26 ENCOUNTER — Other Ambulatory Visit: Payer: Self-pay

## 2018-08-26 ENCOUNTER — Encounter: Payer: Self-pay | Admitting: Physical Therapy

## 2018-08-26 ENCOUNTER — Ambulatory Visit: Payer: Medicare Other | Attending: Anesthesiology | Admitting: Physical Therapy

## 2018-08-26 DIAGNOSIS — M4126 Other idiopathic scoliosis, lumbar region: Secondary | ICD-10-CM | POA: Diagnosis not present

## 2018-08-26 DIAGNOSIS — M5416 Radiculopathy, lumbar region: Secondary | ICD-10-CM | POA: Diagnosis present

## 2018-08-26 NOTE — Therapy (Addendum)
[facility] [address] [place], [address] Phone: [phone]   Fax:  [phone]  Physical Therapy Treatment/ Progress Noted Reporting Period 07/20/18 - 08/26/18   Patient Details  Name: Colleen Payne MRN: [id number] Date of Birth: [birth year] Referring Provider (PT): Traci Sermon MD   Encounter Date: 08/26/2018  PT End of Session - 08/26/18 1132    Visit Number  20    Number of Visits  33    Date for PT Re-Evaluation  09/25/18    Authorization Type  Medicare    Authorization Time Period  8/10    PT Start Time  1115    PT Stop Time  1200    PT Time Calculation (min)  45 min    Activity Tolerance  Patient tolerated treatment well    Behavior During Therapy  [facility] for tasks assessed/performed       Past Medical History:  Diagnosis Date  . Anxiety   . Atrial fibrillation ([place])   . Depression   . Diabetes mellitus without complication (HCC)    Type I   . Headache    stress. 1x/month  . Hypertension   . Insulin pump in place   . Motion sickness    cars  . MVP (mitral valve prolapse)   . Neuromuscular disorder (HCC)    leg weakness s/p back surgeries  . Thyroid disease     Past Surgical History:  Procedure Laterality Date  . BACK SURGERY  2004   3 sugeries between Sept and Nov, fusion L2-S1  . CATARACT EXTRACTION W/PHACO Left 11/23/2014   Procedure: CATARACT EXTRACTION PHACO AND INTRAOCULAR LENS PLACEMENT (IOC);  Surgeon: Leandrew Koyanagi, MD;  Location: [facility];  Service: Ophthalmology;  Laterality: Left;  DIABETIC - insulin pump    There were no vitals filed for this visit.  Subjective Assessment - 08/26/18 1120    Subjective  Pt reports she "pulled the L side of her back" transferring her husband, and this is bothering her some today. Reports 7/10 pain.    Pertinent History  Patient is 68 yo female that complains of lumbar pain and inability to walk erect, as well as  hip/abdominal pain. Patient with history of lumbar fusion (L2-S1). Xray results show: Fairly stable appearance of the levoscoliosis of the lower thoracic and of the lumbar spine.Stable lateral subluxation toward the left of L2 with respect L3. Stable partial compression of the body of L1. Patient complains of frustration with physicians, and that her symptoms are worsening over time. 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 Reported that her pain and difficulty worsened after trying to pick her husband up about 1 year ago. Patient mentioned that she has been told that she has "spinal fluid pockets that are leaking". PMH also includes DM I with insulin pump,  afib, HTN,  HLD, chronic pain syndrome, SOB.    Limitations  Standing;Walking;House hold activities;Other (comment)    Diagnostic tests  see exray results: Fairly stable appearance of the levoscoliosis of the lower thoracic and lumbar spine. Stable lateral subluxation toward left of L2 with respect of L3, stable partial compression of the body of L1.    Patient Stated Goals  pain relief, standing erect    Pain Onset  More than a month ago       Ther-Ex -bilat modified thomas stretchsupine bilat knee flex > stretch x10sec hold x10 - Prone prop  75mins with cuing to keep ASIS contact with mat table; STM to L lower tspine/upper lumbar spine paraspinals (concordant pain) - Prone press up 2x 10 with TC at hips - Alt supermans 3x 6 b/l with cuing initially for proper form with good caryr over following - Thread the needle x5 each side, TC for tspine rotation with good carry over following - Good mornings with PVC outstretched 3x 10 with TC initially for set up with good carry over following - Ext over theraball with cuing to try to keep heels on floor 2x 10 - Childs pose 30sec                           PT Education - 08/26/18 1131    Education Details  therex form    Person(s)  Educated  Patient    Methods  Explanation;Demonstration;Verbal cues;Tactile cues    Comprehension  Verbalized understanding;Returned demonstration;Verbal cues required;Tactile cues required       PT Short Term Goals - 07/27/18 0933      PT SHORT TERM GOAL #1   Title  Patient will be adherent to initial HEP at least 3x a week to improve functional strength and balance for better safety at home.    Time  4    Period  Weeks    Status  Achieved    Target Date  12/17/17        PT Long Term Goals - 07/31/18 1006      PT LONG TERM GOAL #1   Title   Patient will be independent in bending down towards floor and picking up small object (<5 pounds) and then stand back up with pain 5/10 or less to improve ability to pick up and clean up room at home    Baseline  08/10/18 8/10 with "all motion", able to pick up object    Time  8    Period  Weeks    Status  On-going      PT LONG TERM GOAL #2   Title  Patient will increase BLE gross strength to 4+/5 as to improve functional strength for independent gait, increased standing tolerance and increased ADL ability.    Baseline  03/18/18 R/L flexion: 4+/4+ ER 4+/4 IR 5/5 ABD 4+/5 Ext 4/4    Time  8    Period  Weeks    Status  Deferred      PT LONG TERM GOAL #3   Title  Patient will report a worst pain of 5/10 with transfers of her husband to improve tolerance with ADLs     Baseline  07/31/18 8-10/10 pain with transfers    Time  8    Period  Weeks    Status  Deferred      PT LONG TERM GOAL #4   Title  Pt will decrease 5TSTS by at least 3 seconds in order to demonstrate clinically significant improvement in LE strength    Baseline  07/31/18 13sec 07/20/18 15sec 06/10/18 16sec    Time  8    Period  Days    Status  Achieved      PT LONG TERM GOAL #5   Title  Pt will increase 10MWT by at least 0.13 m/s in order to demonstrate clinically significant improvement in community ambulation    Baseline  07/31/18 0.48m/s 07/20/18 same 06/10/18 0.3m/s    Time   8    Period  Weeks    Status  Deferred      PT LONG TERM GOAL #7   Title  Patient will increase standing tolerance to 75min without increased pain in order to complete household chores/cooking    Baseline  07/31/18 Can stand for 75mins with 8/10 pain    Time  8    Period  Weeks    Status  Deferred             Plan - 08/26/18 1305    Clinical Impression Statement  PT continued therex progression as able within pain limits. Patient has some increased pain this session d/t "pulling her back" with trigger points at L lower tspine/upper lumbar spine paraspinals. Patinet reports decreased pain following session. Some cuing needed for proper form with therex with good carry over following.    Stability/Clinical Decision Making  Evolving/Moderate complexity    Clinical Decision Making  Moderate    Rehab Potential  Fair    Clinical Impairments Affecting Rehab Potential  comorbidities, symptom duration, curvature degree    PT Frequency  2x / week    PT Duration  8 weeks    PT Treatment/Interventions  ADLs/Self Care Home Management;Aquatic Therapy;Cryotherapy;Ultrasound;Parrafin;Traction;Moist Heat;Electrical Stimulation;DME Instruction;Gait training;Stair training;Functional mobility training;Neuromuscular re-education;Balance training;Therapeutic exercise;Therapeutic activities;Patient/family education;Energy conservation;Spinal Manipulations;Joint Manipulations;Passive range of motion;Dry needling;Manual techniques    PT Next Visit Plan  Progress Hip and core strengthening as tolerated    PT Home Exercise Plan  Added standing pec stretch in doorway to HEP today    Consulted and Agree with Plan of Care  Patient       Patient will benefit from skilled therapeutic intervention in order to improve the following deficits and impairments:  Abnormal gait, Decreased balance, Decreased endurance, Decreased mobility, Difficulty walking, Hypomobility, Increased muscle spasms, Decreased range of motion,  Pain, Postural dysfunction, Impaired flexibility, Increased fascial restricitons, Decreased strength, Decreased activity tolerance  Visit Diagnosis: 1. Other idiopathic scoliosis, lumbar region   2. Radiculopathy, lumbar region        Problem List Patient Active Problem List   Diagnosis Date Noted  . Central sleep apnea 10/09/2016  . Hypertension 10/09/2016  . Lumbar postlaminectomy syndrome 10/09/2016  . Lumbar radiculopathy 10/09/2016  . Diabetic polyneuropathy ([place]) 10/07/2016  . Right lumbosacral radiculopathy 10/07/2016  . Healthcare maintenance 08/26/2016  . DKA (diabetic ketoacidoses) ([place]) 06/14/2016  . Atypical chest pain 02/23/2016  . SOB (shortness of breath) 02/23/2016  . Pain medication agreement signed 01/13/2015  . Acquired hypothyroidism 05/30/2014  . Frontal sinusitis 12/17/2013  . Atrial fibrillation ([place]) 11/29/2013  . Major depression in remission ([facility]) 11/20/2013  . 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 Chronic pain syndrome 08/27/2013  . Back pain 07/17/2013  . HTN (hypertension), benign 07/17/2013  . Hyperlipidemia 07/17/2013  . Pancreatic insufficiency 07/17/2013  . Chronic postoperative pain 01/01/2013  . Long term current use of opiate analgesic 11/04/2012  . Hypothyroidism 08/25/2012  . Radiculopathy of leg 05/01/2011   Shelton Silvas PT, DPT Shelton Silvas 08/26/2018, 1:08 PM   [facility] [address], [place], [address] Phone: [phone]   Fax:  [phone]  Name: Colleen Payne MRN: [id number] Date of Birth: [birth year]

## 2018-08-28 ENCOUNTER — Other Ambulatory Visit: Payer: Self-pay

## 2018-08-28 ENCOUNTER — Encounter: Payer: Self-pay | Admitting: Physical Therapy

## 2018-08-28 ENCOUNTER — Ambulatory Visit: Payer: Medicare Other

## 2018-08-28 DIAGNOSIS — M4126 Other idiopathic scoliosis, lumbar region: Secondary | ICD-10-CM | POA: Diagnosis not present

## 2018-08-28 DIAGNOSIS — M5416 Radiculopathy, lumbar region: Secondary | ICD-10-CM

## 2018-08-28 NOTE — Therapy (Signed)
[facility] [address] [place], [address] Phone: [phone]   Fax:  [phone]  Physical Therapy Treatment  Patient Details  Name: Colleen Payne MRN: [id number] Date of Birth: 1950-02-22 Referring Provider (PT): Traci Sermon MD   Encounter Date: 08/28/2018  PT End of Session - 08/28/18 0917    Visit Number  21    Number of Visits  33    Date for PT Re-Evaluation  09/25/18    Authorization Type  Medicare    Authorization Time Period  9/10    PT Start Time  0917    PT Stop Time  1000    PT Time Calculation (min)  43 min    Activity Tolerance  Patient tolerated treatment well    Behavior During Therapy  [facility] for tasks assessed/performed       Past Medical History:  Diagnosis Date  . Anxiety   . Atrial fibrillation ([place])   . Depression   . Diabetes mellitus without complication (HCC)    Type I   . Headache    stress. 1x/month  . Hypertension   . Insulin pump in place   . Motion sickness    cars  . MVP (mitral valve prolapse)   . Neuromuscular disorder (HCC)    leg weakness s/p back surgeries  . Thyroid disease     Past Surgical History:  Procedure Laterality Date  . BACK SURGERY  2004   3 sugeries between Sept and Nov, fusion L2-S1  . CATARACT EXTRACTION W/PHACO Left 11/23/2014   Procedure: CATARACT EXTRACTION PHACO AND INTRAOCULAR LENS PLACEMENT (IOC);  Surgeon: Leandrew Koyanagi, MD;  Location: [facility];  Service: Ophthalmology;  Laterality: Left;  DIABETIC - insulin pump    There were no vitals filed for this visit.  Subjective Assessment - 08/28/18 0918    Subjective  Patient reported that after wednesday she felt good and stayed up "pretty straight". Pt reported some stiffness at start of sessoin with pain.    Pertinent History  Patient is 68 yo female that complains of lumbar pain and inability to walk erect, as well as hip/abdominal pain. Patient with history  of lumbar fusion (L2-S1). Xray results show: Fairly stable appearance of the levoscoliosis of the lower thoracic and of the lumbar spine.Stable lateral subluxation toward the left of L2 with respect L3. Stable partial compression of the body of L1. Patient complains of frustration with physicians, and that her symptoms are worsening over time. 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 Reported that her pain and difficulty worsened after trying to pick her husband up about 1 year ago. Patient mentioned that she has been told that she has "spinal fluid pockets that are leaking". PMH also includes DM I with insulin pump,  afib, HTN,  HLD, chronic pain syndrome, SOB.    Limitations  Standing;Walking;House hold activities;Other (comment)    Diagnostic tests  see exray results: Fairly stable appearance of the levoscoliosis of the lower thoracic and lumbar spine. Stable lateral subluxation toward left of L2 with respect of L3, stable partial compression of the body of L1.    Patient Stated Goals  pain relief, standing erect    Currently in Pain?  Yes    Pain Score  7     Pain Location  Back    Pain Orientation  Left;Lower    Pain Descriptors / Indicators  Aching;Burning;Tightness  Pain Type  Chronic pain    Pain Onset  More than a month ago    Pain Frequency  Constant       Ther-Ex - bilat modified thomas stretch supine bilat knee flex > stretch x10sec hold x10 - Prone prop 87mins with cuing to keep ASIS contact with mat table; STM to L lower tspine/upper lumbar spine paraspinals (concordant pain) - Prone press up 2x 10 with TC at hips - Alt supermans 2x3 b/l with cuing initially for proper form with good caryr over following -prone hip extension 2x5 - Thread the needle ( hands and knees)  x10 (sets of 3-5 reps) each side, TC for tspine rotation with good carry over following - Good mornings with PVC outstretched 2x 10 with TC initially for set up with good  carry over following - Ext over theraball with cuing to try to keep heels on floor 2x 10 with overpressure from PT as able, second set of 10 held longer - Childs pose 2x30sec   pt response/clinical impression: Pt mildly less flexed during ambulation at end of session. Pt most challenged by extension exercises and core control but remained highly motivated, tactile and verbal cues throughout to maximize pt form/technique. Patient reported decrease in pain from 7/10 to 5/10 at end of session. The patient would benefit from further skilled PT to maximize QOL, pain management, and functional abilities.     PT Education - 08/28/18 0916    Education Details  therex form    Person(s) Educated  Patient    Methods  Explanation;Demonstration;Tactile cues;Verbal cues    Comprehension  Verbalized understanding;Returned demonstration;Verbal cues required;Tactile cues required       PT Short Term Goals - 07/27/18 0933      PT SHORT TERM GOAL #1   Title  Patient will be adherent to initial HEP at least 3x a week to improve functional strength and balance for better safety at home.    Time  4    Period  Weeks    Status  Achieved    Target Date  12/17/17        PT Long Term Goals - 07/31/18 1006      PT LONG TERM GOAL #1   Title   Patient will be independent in bending down towards floor and picking up small object (<5 pounds) and then stand back up with pain 5/10 or less to improve ability to pick up and clean up room at home    Baseline  08/10/18 8/10 with "all motion", able to pick up object    Time  8    Period  Weeks    Status  On-going      PT LONG TERM GOAL #2   Title  Patient will increase BLE gross strength to 4+/5 as to improve functional strength for independent gait, increased standing tolerance and increased ADL ability.    Baseline  03/18/18 R/L flexion: 4+/4+ ER 4+/4 IR 5/5 ABD 4+/5 Ext 4/4    Time  8    Period  Weeks    Status  On-going      PT LONG TERM GOAL #3   Title   Patient will report a worst pain of 5/10 with transfers of her husband to improve tolerance with ADLs     Baseline  07/31/18 8-10/10 pain with transfers    Time  8    Period  Weeks    Status  On-going      PT LONG TERM  GOAL #4   Title  Pt will decrease 5TSTS by at least 3 seconds in order to demonstrate clinically significant improvement in LE strength    Baseline  07/31/18 13sec 07/20/18 15sec 06/10/18 16sec    Time  8    Period  Days    Status  Achieved      PT LONG TERM GOAL #5   Title  Pt will increase 10MWT by at least 0.13 m/s in order to demonstrate clinically significant improvement in community ambulation    Baseline  07/31/18 0.46m/s 07/20/18 same 06/10/18 0.39m/s    Time  8    Period  Weeks    Status  On-going      PT LONG TERM GOAL #7   Title  Patient will increase standing tolerance to 50min without increased pain in order to complete household chores/cooking    Baseline  07/31/18 Can stand for 33mins with 8/10 pain    Time  8    Period  Weeks    Status  On-going            Plan - 08/28/18 0955    Clinical Impression Statement  Pt mildly less flexed during ambulation at end of session. Pt most challenged by extension exercises and core control but remained highly motivated, tactile and verbal cues throughout to maximize pt form/technique. Patient reported decrease in pain from 7/10 to 5/10 at end of session. The patient would benefit from further skilled PT to maximize QOL, pain management, and functional abilities.    Rehab Potential  Fair    Clinical Impairments Affecting Rehab Potential  comorbidities, symptom duration, curvature degree    PT Frequency  2x / week    PT Duration  8 weeks    PT Treatment/Interventions  ADLs/Self Care Home Management;Aquatic Therapy;Cryotherapy;Ultrasound;Parrafin;Traction;Moist Heat;Electrical Stimulation;DME Instruction;Gait training;Stair training;Functional mobility training;Neuromuscular re-education;Balance training;Therapeutic  exercise;Therapeutic activities;Patient/family education;Energy conservation;Spinal Manipulations;Joint Manipulations;Passive range of motion;Dry needling;Manual techniques    PT Next Visit Plan  Progress Hip and core strengthening as tolerated    PT Home Exercise Plan  Added standing pec stretch in doorway to HEP today    Consulted and Agree with Plan of Care  Patient       Patient will benefit from skilled therapeutic intervention in order to improve the following deficits and impairments:  Abnormal gait, Decreased balance, Decreased endurance, Decreased mobility, Difficulty walking, Hypomobility, Increased muscle spasms, Decreased range of motion, Pain, Postural dysfunction, Impaired flexibility, Increased fascial restricitons, Decreased strength, Decreased activity tolerance  Visit Diagnosis: 1. Other idiopathic scoliosis, lumbar region   2. Radiculopathy, lumbar region        Problem List Patient Active Problem List   Diagnosis Date Noted  . Central sleep apnea 10/09/2016  . Hypertension 10/09/2016  . Lumbar postlaminectomy syndrome 10/09/2016  . Lumbar radiculopathy 10/09/2016  . Diabetic polyneuropathy ([place]) 10/07/2016  . Right lumbosacral radiculopathy 10/07/2016  . Healthcare maintenance 08/26/2016  . DKA (diabetic ketoacidoses) ([place]) 06/14/2016  . Atypical chest pain 02/23/2016  . SOB (shortness of breath) 02/23/2016  . Pain medication agreement signed 01/13/2015  . Acquired hypothyroidism 05/30/2014  . Frontal sinusitis 12/17/2013  . Atrial fibrillation ([place]) 11/29/2013  . Major depression in remission ([place]) 11/20/2013  . 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 Chronic pain syndrome 08/27/2013  . Back pain 07/17/2013  . HTN (hypertension), benign 07/17/2013  . Hyperlipidemia 07/17/2013  . Pancreatic insufficiency 07/17/2013  . Chronic postoperative pain 01/01/2013  . Long term  current use of opiate analgesic 11/04/2012  .  Hypothyroidism 08/25/2012  . Radiculopathy of leg 05/01/2011    Lieutenant Diego PT, DPT 10:05 AM,08/28/18 [phone]  [facility] [address], [place], [address] Phone: [phone]   Fax:  [phone]  Name: Colleen Payne MRN: [id number] Date of Birth: [birth year]

## 2018-08-31 ENCOUNTER — Ambulatory Visit: Payer: Medicare Other | Admitting: Physical Therapy

## 2018-08-31 ENCOUNTER — Other Ambulatory Visit: Payer: Self-pay

## 2018-08-31 ENCOUNTER — Encounter: Payer: Self-pay | Admitting: Physical Therapy

## 2018-08-31 DIAGNOSIS — M4126 Other idiopathic scoliosis, lumbar region: Secondary | ICD-10-CM

## 2018-08-31 DIAGNOSIS — M5416 Radiculopathy, lumbar region: Secondary | ICD-10-CM

## 2018-08-31 NOTE — Therapy (Signed)
[facility] [address] [place], [address] Phone: [phone]   Fax:  [phone]  Physical Therapy Treatment  Patient Details  Name: Colleen Payne MRN: [id number] Date of Birth: Jun 19, 1950 Referring Provider (PT): Traci Sermon MD   Encounter Date: 08/31/2018  PT End of Session - 08/31/18 1006    Visit Number  22    Number of Visits  33    Date for PT Re-Evaluation  09/25/18    Authorization Type  Medicare    Authorization Time Period  10/10    PT Start Time  0948    PT Stop Time  1030    PT Time Calculation (min)  42 min    Activity Tolerance  Patient tolerated treatment well    Behavior During Therapy  [facility] for tasks assessed/performed       Past Medical History:  Diagnosis Date  . Anxiety   . Atrial fibrillation ([place])   . Depression   . Diabetes mellitus without complication (HCC)    Type I   . Headache    stress. 1x/month  . Hypertension   . Insulin pump in place   . Motion sickness    cars  . MVP (mitral valve prolapse)   . Neuromuscular disorder (HCC)    leg weakness s/p back surgeries  . Thyroid disease     Past Surgical History:  Procedure Laterality Date  . BACK SURGERY  2004   3 sugeries between Sept and Nov, fusion L2-S1  . CATARACT EXTRACTION W/PHACO Left 11/23/2014   Procedure: CATARACT EXTRACTION PHACO AND INTRAOCULAR LENS PLACEMENT (IOC);  Surgeon: Leandrew Koyanagi, MD;  Location: [facility];  Service: Ophthalmology;  Laterality: Left;  DIABETIC - insulin pump    There were no vitals filed for this visit.  Subjective Assessment - 08/31/18 0956    Subjective  Continues to report 8/10 L sided back pain from when she transferred her husband "too quickly" last week    Pertinent History  Patient is 68 yo female that complains of lumbar pain and inability to walk erect, as well as hip/abdominal pain. Patient with history of lumbar fusion (L2-S1). Xray  results show: Fairly stable appearance of the levoscoliosis of the lower thoracic and of the lumbar spine.Stable lateral subluxation toward the left of L2 with respect L3. Stable partial compression of the body of L1. Patient complains of frustration with physicians, and that her symptoms are worsening over time. 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 Reported that her pain and difficulty worsened after trying to pick her husband up about 1 year ago. Patient mentioned that she has been told that she has "spinal fluid pockets that are leaking". PMH also includes DM I with insulin pump,  afib, HTN,  HLD, chronic pain syndrome, SOB.    Limitations  Standing;Walking;House hold activities;Other (comment)    Diagnostic tests  see exray results: Fairly stable appearance of the levoscoliosis of the lower thoracic and lumbar spine. Stable lateral subluxation toward left of L2 with respect of L3, stable partial compression of the body of L1.    Patient Stated Goals  pain relief, standing erect              Ther-Ex - Standing hip flexor stretch on wall 2x 30sec hold each side - Ext over theraball with cuing to try to keep heels on floor x10 3 sec holds - Prone prop  50mins with cuing to keep ASIS contact with mat table; STM to L lower tspine/upper lumbar spine paraspinals (concordant pain), increased tension here to date - Prone press up 2x 10 with TC at hips - Open the book with R knee bend and UE rotating for L lower tspine/upper lspine 2x 10 with 3sec hold in rotation for gentle stretch with min cuing for proper form - Childs pose 2x 30sec   - MATRIX hip ext 25# 3x 10 each side with TC for posture with good carry over following - Thread the needle x5 each side, TC for tspine rotation with good carry over following                          PT Education - 08/31/18 1001    Education Details  therex form    Person(s) Educated  Patient     Methods  Explanation;Demonstration;Verbal cues;Tactile cues    Comprehension  Verbalized understanding;Returned demonstration;Verbal cues required;Tactile cues required       PT Short Term Goals - 07/27/18 0933      PT SHORT TERM GOAL #1   Title  Patient will be adherent to initial HEP at least 3x a week to improve functional strength and balance for better safety at home.    Time  4    Period  Weeks    Status  Achieved    Target Date  12/17/17        PT Long Term Goals - 08/31/18 1007      PT LONG TERM GOAL #1   Title   Patient will be independent in bending down towards floor and picking up small object (<5 pounds) and then stand back up with pain 5/10 or less to improve ability to pick up and clean up room at home    Baseline  08/31/18 7/10 able to pick up object    Time  8    Period  Weeks    Status  On-going      PT LONG TERM GOAL #2   Title  Patient will increase BLE gross strength to 4+/5 as to improve functional strength for independent gait, increased standing tolerance and increased ADL ability.    Baseline  08/31/18 R/L flexion: 5/5 ER 5/5 IR 4+/4+ ABD 5/5 Ext 4+/4    Time  8    Period  Weeks    Status  Achieved      PT LONG TERM GOAL #3   Title  Patient will report a worst pain of 5/10 with transfers of her husband to improve tolerance with ADLs     Baseline  08/31/18 8/10 pain with transfers    Time  8    Period  Weeks    Status  On-going      PT LONG TERM GOAL #4   Title  Pt will decrease 5TSTS to 10sec order to demonstrate clinically significant improvement in LE strength, and reduced fall risk    Baseline  08/31/18 12sec 07/31/18 13sec 07/20/18 15sec 06/10/18 16sec    Time  8    Period  Days    Status  On-going      PT LONG TERM GOAL #5   Title  Pt will increase 10MWT by at least 0.13 m/s in order to demonstrate clinically significant improvement in community ambulation    Baseline  07/31/18 0.63m/s 07/20/18 same 06/10/18 0.69m/s    Time  8    Period  [place]  Status  On-going      PT LONG TERM GOAL #7   Title  Patient will increase standing tolerance to 62min without increased pain in order to complete household chores/cooking    Baseline  08/31/18 Can stand for 46mins with 8/10 pain    Time  8    Period  Weeks    Status  On-going            Plan - 08/31/18 1023    Clinical Impression Statement  PT continued progression for extension posture muscles with good carry over of proper form following some cuing. Patient continues to report increased L paraspinal pain with noted tension. Patient does report this feels better following session (6/10). PT advised patient to utilize heat at home for pain reduction. Goal reassessment for medicare complete with good progress, especially in strength. Some continued deficits in strength to be addressed for postural restoration and transferring husband without pain, but PT will also begin to address muscular endurance as patient is able in the future.    Stability/Clinical Decision Making  Evolving/Moderate complexity    Clinical Decision Making  Moderate    Rehab Potential  Fair    Clinical Impairments Affecting Rehab Potential  comorbidities, symptom duration, curvature degree    PT Frequency  2x / week    PT Duration  8 weeks    PT Treatment/Interventions  ADLs/Self Care Home Management;Aquatic Therapy;Cryotherapy;Ultrasound;Parrafin;Traction;Moist Heat;Electrical Stimulation;DME Instruction;Gait training;Stair training;Functional mobility training;Neuromuscular re-education;Balance training;Therapeutic exercise;Therapeutic activities;Patient/family education;Energy conservation;Spinal Manipulations;Joint Manipulations;Passive range of motion;Dry needling;Manual techniques    PT Next Visit Plan  Progress Hip and core strengthening as tolerated    PT Home Exercise Plan  Added standing pec stretch in doorway to HEP today    Consulted and Agree with Plan of Care  Patient       Patient will benefit from  skilled therapeutic intervention in order to improve the following deficits and impairments:  Abnormal gait, Decreased balance, Decreased endurance, Decreased mobility, Difficulty walking, Hypomobility, Increased muscle spasms, Decreased range of motion, Pain, Postural dysfunction, Impaired flexibility, Increased fascial restricitons, Decreased strength, Decreased activity tolerance  Visit Diagnosis: 1. Other idiopathic scoliosis, lumbar region   2. Radiculopathy, lumbar region        Problem List Patient Active Problem List   Diagnosis Date Noted  . Central sleep apnea 10/09/2016  . Hypertension 10/09/2016  . Lumbar postlaminectomy syndrome 10/09/2016  . Lumbar radiculopathy 10/09/2016  . Diabetic polyneuropathy ([place]) 10/07/2016  . Right lumbosacral radiculopathy 10/07/2016  . Healthcare maintenance 08/26/2016  . DKA (diabetic ketoacidoses) ([place]) 06/14/2016  . Atypical chest pain 02/23/2016  . SOB (shortness of breath) 02/23/2016  . Pain medication agreement signed 01/13/2015  . Acquired hypothyroidism 05/30/2014  . Frontal sinusitis 12/17/2013  . Atrial fibrillation ([place]) 11/29/2013  . Major depression in remission ([place]) 11/20/2013  . 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 Chronic pain syndrome 08/27/2013  . Back pain 07/17/2013  . HTN (hypertension), benign 07/17/2013  . Hyperlipidemia 07/17/2013  . Pancreatic insufficiency 07/17/2013  . Chronic postoperative pain 01/01/2013  . Long term current use of opiate analgesic 11/04/2012  . Hypothyroidism 08/25/2012  . Radiculopathy of leg 05/01/2011   Shelton Silvas PT, DPT Shelton Silvas 08/31/2018, 10:32 AM  [facility] [address] [place], [address] Phone: [phone]   Fax:  [phone]  Name: Colleen Payne MRN: [id number] Date of Birth: 1950-07-30

## 2018-09-04 ENCOUNTER — Encounter: Payer: Self-pay | Admitting: Physical Therapy

## 2018-09-04 ENCOUNTER — Ambulatory Visit: Payer: Medicare Other | Admitting: Physical Therapy

## 2018-09-04 ENCOUNTER — Other Ambulatory Visit: Payer: Self-pay

## 2018-09-04 DIAGNOSIS — M5416 Radiculopathy, lumbar region: Secondary | ICD-10-CM

## 2018-09-04 DIAGNOSIS — M4126 Other idiopathic scoliosis, lumbar region: Secondary | ICD-10-CM

## 2018-09-04 NOTE — Therapy (Signed)
[facility] [address] [place], [address] Phone: [phone]   Fax:  [phone]  Physical Therapy Treatment  Patient Details  Name: Colleen Payne MRN: [id number] Date of Birth: 10-04-1950 Referring Provider (PT): Traci Sermon MD   Encounter Date: 09/04/2018  PT End of Session - 09/04/18 0943    Visit Number  23    Number of Visits  33    Date for PT Re-Evaluation  09/25/18    Authorization Type  Medicare    Authorization Time Period  1/10    PT Start Time  0915    PT Stop Time  1000    PT Time Calculation (min)  45 min    Activity Tolerance  Patient tolerated treatment well    Behavior During Therapy  [facility] for tasks assessed/performed       Past Medical History:  Diagnosis Date  . Anxiety   . Atrial fibrillation ([place])   . Depression   . Diabetes mellitus without complication (HCC)    Type I   . Headache    stress. 1x/month  . Hypertension   . Insulin pump in place   . Motion sickness    cars  . MVP (mitral valve prolapse)   . Neuromuscular disorder (HCC)    leg weakness s/p back surgeries  . Thyroid disease     Past Surgical History:  Procedure Laterality Date  . BACK SURGERY  2004   3 sugeries between Sept and Nov, fusion L2-S1  . CATARACT EXTRACTION W/PHACO Left 11/23/2014   Procedure: CATARACT EXTRACTION PHACO AND INTRAOCULAR LENS PLACEMENT (IOC);  Surgeon: Leandrew Koyanagi, MD;  Location: [facility];  Service: Ophthalmology;  Laterality: Left;  DIABETIC - insulin pump    There were no vitals filed for this visit.  Subjective Assessment - 09/04/18 0932    Subjective  Reports her back feels somewhat better, 7/10 pain today. Has been able to somewhat complete HEP    Pertinent History  Patient is 68 yo female that complains of lumbar pain and inability to walk erect, as well as hip/abdominal pain. Patient with history of lumbar fusion (L2-S1). Xray results show:  Fairly stable appearance of the levoscoliosis of the lower thoracic and of the lumbar spine.Stable lateral subluxation toward the left of L2 with respect L3. Stable partial compression of the body of L1. Patient complains of frustration with physicians, and that her symptoms are worsening over time. 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 Reported that her pain and difficulty worsened after trying to pick her husband up about 1 year ago. Patient mentioned that she has been told that she has "spinal fluid pockets that are leaking". PMH also includes DM I with insulin pump,  afib, HTN,  HLD, chronic pain syndrome, SOB.    Limitations  Standing;Walking;House hold activities;Other (comment)    Diagnostic tests  see exray results: Fairly stable appearance of the levoscoliosis of the lower thoracic and lumbar spine. Stable lateral subluxation toward left of L2 with respect of L3, stable partial compression of the body of L1.    Patient Stated Goals  pain relief, standing erect    Pain Onset  More than a month ago         Ther-Ex - Supine bilat hip flexor stretch progression with bilat hip flex to stretch last hold 57min - Prone prop 49min - Prone press up 2x10 - Qped hip  ext x8 (each LE), cuing to prevent torso rotation with some success - Birddog 2x 6 with excessive trunk rotation that is somewhat improved with cuing, better with TC stabilization - TRX pull up 3x 10 with cuing for "lean back" with hip ext, decent carry over - Backward walking with walker to encourage hip ext x54ft; with RTB resistance 3x 36ft - Side stepping RTB 4x 51ft HHA for safety, cuing for foot clearance - Thoracic extover mat table  2x 10 with cuing to attempt to maintain heel down, difficulty on RLE                       PT Education - 09/04/18 0942    Education Details  therex form    Person(s) Educated  Patient    Methods  Explanation;Demonstration;Tactile  cues;Verbal cues    Comprehension  Verbalized understanding;Returned demonstration;Verbal cues required       PT Short Term Goals - 07/27/18 0933      PT SHORT TERM GOAL #1   Title  Patient will be adherent to initial HEP at least 3x a week to improve functional strength and balance for better safety at home.    Time  4    Period  Weeks    Status  Achieved    Target Date  12/17/17        PT Long Term Goals - 08/31/18 1007      PT LONG TERM GOAL #1   Title   Patient will be independent in bending down towards floor and picking up small object (<5 pounds) and then stand back up with pain 5/10 or less to improve ability to pick up and clean up room at home    Baseline  08/31/18 7/10 able to pick up object    Time  8    Period  Weeks    Status  On-going      PT LONG TERM GOAL #2   Title  Patient will increase BLE gross strength to 4+/5 as to improve functional strength for independent gait, increased standing tolerance and increased ADL ability.    Baseline  08/31/18 R/L flexion: 5/5 ER 5/5 IR 4+/4+ ABD 5/5 Ext 4+/4    Time  8    Period  Weeks    Status  Achieved      PT LONG TERM GOAL #3   Title  Patient will report a worst pain of 5/10 with transfers of her husband to improve tolerance with ADLs     Baseline  08/31/18 8/10 pain with transfers    Time  8    Period  Weeks    Status  On-going      PT LONG TERM GOAL #4   Title  Pt will decrease 5TSTS to 10sec order to demonstrate clinically significant improvement in LE strength, and reduced fall risk    Baseline  08/31/18 12sec 07/31/18 13sec 07/20/18 15sec 06/10/18 16sec    Time  8    Period  Days    Status  On-going      PT LONG TERM GOAL #5   Title  Pt will increase 10MWT by at least 0.13 m/s in order to demonstrate clinically significant improvement in community ambulation    Baseline  07/31/18 0.62m/s 07/20/18 same 06/10/18 0.39m/s    Time  8    Period  Weeks    Status  On-going      PT LONG TERM GOAL #7   Title  Patient  will increase standing tolerance to 40min without increased pain in order to complete household chores/cooking    Baseline  08/31/18 Can stand for 65mins with 8/10 pain    Time  8    Period  Weeks    Status  On-going            Plan - 09/04/18 1000    Clinical Impression Statement  PT able to progress therex for ext strengthening this session d/t increased pain tolerance from possible injury last week. Patient does need cuing for postural encouragement throughout, and is able to somewhat improve. Patinet very motivated throughout session with great efforts to attempt ext postures. PT will continue progression as able.    Stability/Clinical Decision Making  Evolving/Moderate complexity    Clinical Decision Making  Moderate    Rehab Potential  Fair    Clinical Impairments Affecting Rehab Potential  comorbidities, symptom duration, curvature degree    PT Frequency  2x / week    PT Duration  8 weeks    PT Treatment/Interventions  ADLs/Self Care Home Management;Aquatic Therapy;Cryotherapy;Ultrasound;Parrafin;Traction;Moist Heat;Electrical Stimulation;DME Instruction;Gait training;Stair training;Functional mobility training;Neuromuscular re-education;Balance training;Therapeutic exercise;Therapeutic activities;Patient/family education;Energy conservation;Spinal Manipulations;Joint Manipulations;Passive range of motion;Dry needling;Manual techniques    PT Next Visit Plan  Progress Hip and core strengthening as tolerated    PT Home Exercise Plan  Added standing pec stretch in doorway to HEP today    Consulted and Agree with Plan of Care  Patient       Patient will benefit from skilled therapeutic intervention in order to improve the following deficits and impairments:  Abnormal gait, Decreased balance, Decreased endurance, Decreased mobility, Difficulty walking, Hypomobility, Increased muscle spasms, Decreased range of motion, Pain, Postural dysfunction, Impaired flexibility, Increased fascial  restricitons, Decreased strength, Decreased activity tolerance  Visit Diagnosis: 1. Other idiopathic scoliosis, lumbar region   2. Radiculopathy, lumbar region        Problem List Patient Active Problem List   Diagnosis Date Noted  . Central sleep apnea 10/09/2016  . Hypertension 10/09/2016  . Lumbar postlaminectomy syndrome 10/09/2016  . Lumbar radiculopathy 10/09/2016  . Diabetic polyneuropathy ([place]) 10/07/2016  . Right lumbosacral radiculopathy 10/07/2016  . Healthcare maintenance 08/26/2016  . DKA (diabetic ketoacidoses) ([place]) 06/14/2016  . Atypical chest pain 02/23/2016  . SOB (shortness of breath) 02/23/2016  . Pain medication agreement signed 01/13/2015  . Acquired hypothyroidism 05/30/2014  . Frontal sinusitis 12/17/2013  . Atrial fibrillation ([place]) 11/29/2013  . Major depression in remission ([place]) 11/20/2013  . 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 Chronic pain syndrome 08/27/2013  . Back pain 07/17/2013  . HTN (hypertension), benign 07/17/2013  . Hyperlipidemia 07/17/2013  . Pancreatic insufficiency 07/17/2013  . Chronic postoperative pain 01/01/2013  . Long term current use of opiate analgesic 11/04/2012  . Hypothyroidism 08/25/2012  . Radiculopathy of leg 05/01/2011   Shelton Silvas PT, DPT Shelton Silvas 09/04/2018, 10:04 AM  [facility] [address] [place], [address] Phone: [phone]   Fax:  [phone]  Name: Colleen Payne MRN: [id number] Date of Birth: December 10, 1950

## 2018-09-07 ENCOUNTER — Ambulatory Visit: Payer: Medicare Other | Admitting: Physical Therapy

## 2018-09-07 ENCOUNTER — Other Ambulatory Visit: Payer: Self-pay

## 2018-09-07 ENCOUNTER — Encounter: Payer: Self-pay | Admitting: Physical Therapy

## 2018-09-07 DIAGNOSIS — M4126 Other idiopathic scoliosis, lumbar region: Secondary | ICD-10-CM | POA: Diagnosis not present

## 2018-09-07 DIAGNOSIS — M5416 Radiculopathy, lumbar region: Secondary | ICD-10-CM

## 2018-09-07 NOTE — Therapy (Signed)
[facility] [address] [place], [address] Phone: [phone]   Fax:  [phone]  Physical Therapy Treatment  Patient Details  Name: Colleen Payne MRN: [id number] Date of Birth: 1950/08/21 Referring Provider (PT): Traci Sermon MD   Encounter Date: 09/07/2018  PT End of Session - 09/07/18 0957    Visit Number  24    Number of Visits  33    Date for PT Re-Evaluation  09/25/18    Authorization Type  Medicare    Authorization Time Period  2/10    PT Start Time  0945    PT Stop Time  1025    PT Time Calculation (min)  40 min    Activity Tolerance  Patient tolerated treatment well    Behavior During Therapy  [facility] for tasks assessed/performed       Past Medical History:  Diagnosis Date  . Anxiety   . Atrial fibrillation ([place])   . Depression   . Diabetes mellitus without complication (HCC)    Type I   . Headache    stress. 1x/month  . Hypertension   . Insulin pump in place   . Motion sickness    cars  . MVP (mitral valve prolapse)   . Neuromuscular disorder (HCC)    leg weakness s/p back surgeries  . Thyroid disease     Past Surgical History:  Procedure Laterality Date  . BACK SURGERY  2004   3 sugeries between Sept and Nov, fusion L2-S1  . CATARACT EXTRACTION W/PHACO Left 11/23/2014   Procedure: CATARACT EXTRACTION PHACO AND INTRAOCULAR LENS PLACEMENT (IOC);  Surgeon: Leandrew Koyanagi, MD;  Location: [facility];  Service: Ophthalmology;  Laterality: Left;  DIABETIC - insulin pump    There were no vitals filed for this visit.  Subjective Assessment - 09/07/18 0954    Subjective  Reports her back hurts a little more today, thinks it is d/t the weather, about 8/10. Had her great granddaughter over the weekend and is excited.    Pertinent History  Patient is 68 yo female that complains of lumbar pain and inability to walk erect, as well as hip/abdominal pain. Patient with  history of lumbar fusion (L2-S1). Xray results show: Fairly stable appearance of the levoscoliosis of the lower thoracic and of the lumbar spine.Stable lateral subluxation toward the left of L2 with respect L3. Stable partial compression of the body of L1. Patient complains of frustration with physicians, and that her symptoms are worsening over time. 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 Reported that her pain and difficulty worsened after trying to pick her husband up about 1 year ago. Patient mentioned that she has been told that she has "spinal fluid pockets that are leaking". PMH also includes DM I with insulin pump,  afib, HTN,  HLD, chronic pain syndrome, SOB.    Limitations  Standing;Walking;House hold activities;Other (comment)    Diagnostic tests  see exray results: Fairly stable appearance of the levoscoliosis of the lower thoracic and lumbar spine. Stable lateral subluxation toward left of L2 with respect of L3, stable partial compression of the body of L1.    Patient Stated Goals  pain relief, standing erect          Ther-Ex -Supine bilat hip flexor stretch progression with bilat hip flex to stretch last hold 7min - Prone press up 3x10 with breaks in elbow prop; following last set  reports she feels uncomfortable pull in L side - R sidelying L oblique/intercostal stretch with LLE off table and LUE overhead 42min with some STM  - Birddog 3x 6 with TC for hip stabilization needed minA; rest in child's pose between sets - Thoracic extover mat table  2x 10 with TC/minA stabilization at knees for TKE with maintaining foot flat during thoracic ext, with difficulty - Goodmornings with PCP pipe outstretched to overhead to encourage ext posture 3x 10 with good carry over following, some TC at R knee to maintain heel down and TKE                      PT Education - 09/07/18 0956    Education Details  therex form    Person(s)  Educated  Patient    Methods  Explanation;Demonstration;Tactile cues;Verbal cues    Comprehension  Verbalized understanding;Returned demonstration;Verbal cues required;Tactile cues required       PT Short Term Goals - 07/27/18 0933      PT SHORT TERM GOAL #1   Title  Patient will be adherent to initial HEP at least 3x a week to improve functional strength and balance for better safety at home.    Time  4    Period  Weeks    Status  Achieved    Target Date  12/17/17        PT Long Term Goals - 08/31/18 1007      PT LONG TERM GOAL #1   Title   Patient will be independent in bending down towards floor and picking up small object (<5 pounds) and then stand back up with pain 5/10 or less to improve ability to pick up and clean up room at home    Baseline  08/31/18 7/10 able to pick up object    Time  8    Period  Weeks    Status  On-going      PT LONG TERM GOAL #2   Title  Patient will increase BLE gross strength to 4+/5 as to improve functional strength for independent gait, increased standing tolerance and increased ADL ability.    Baseline  08/31/18 R/L flexion: 5/5 ER 5/5 IR 4+/4+ ABD 5/5 Ext 4+/4    Time  8    Period  Weeks    Status  Achieved      PT LONG TERM GOAL #3   Title  Patient will report a worst pain of 5/10 with transfers of her husband to improve tolerance with ADLs     Baseline  08/31/18 8/10 pain with transfers    Time  8    Period  Weeks    Status  On-going      PT LONG TERM GOAL #4   Title  Pt will decrease 5TSTS to 10sec order to demonstrate clinically significant improvement in LE strength, and reduced fall risk    Baseline  08/31/18 12sec 07/31/18 13sec 07/20/18 15sec 06/10/18 16sec    Time  8    Period  Days    Status  On-going      PT LONG TERM GOAL #5   Title  Pt will increase 10MWT by at least 0.13 m/s in order to demonstrate clinically significant improvement in community ambulation    Baseline  07/31/18 0.41m/s 07/20/18 same 06/10/18 0.73m/s    Time   8    Period  Weeks    Status  On-going      PT LONG TERM GOAL #  7   Title  Patient will increase standing tolerance to 83min without increased pain in order to complete household chores/cooking    Baseline  08/31/18 Can stand for 60mins with 8/10 pain    Time  8    Period  Weeks    Status  On-going            Plan - 09/07/18 1054    Clinical Impression Statement  Patient with some increased discomfort in L sided oblique/intercostals, requiring some stretching, and manual work this session with good success. PT continued therex progression for extension with good carry over of proper posture, and some TC needed throughout to [facility] proper posture with pt with difficulty with TKA with neutral DF while completing back/hip ext focused therex. Patient very motivated throughout session, and is able to complete all therex with proper technique following cuing. Will continue progression as able.    Stability/Clinical Decision Making  Evolving/Moderate complexity    Clinical Decision Making  Moderate    Rehab Potential  Fair    Clinical Impairments Affecting Rehab Potential  comorbidities, symptom duration, curvature degree    PT Frequency  2x / week    PT Duration  8 weeks    PT Treatment/Interventions  ADLs/Self Care Home Management;Aquatic Therapy;Cryotherapy;Ultrasound;Parrafin;Traction;Moist Heat;Electrical Stimulation;DME Instruction;Gait training;Stair training;Functional mobility training;Neuromuscular re-education;Balance training;Therapeutic exercise;Therapeutic activities;Patient/family education;Energy conservation;Spinal Manipulations;Joint Manipulations;Passive range of motion;Dry needling;Manual techniques    PT Next Visit Plan  Progress Hip and core strengthening as tolerated    PT Home Exercise Plan  Added standing pec stretch in doorway to HEP today    Consulted and Agree with Plan of Care  Patient       Patient will benefit from skilled therapeutic intervention in order to  improve the following deficits and impairments:  Abnormal gait, Decreased balance, Decreased endurance, Decreased mobility, Difficulty walking, Hypomobility, Increased muscle spasms, Decreased range of motion, Pain, Postural dysfunction, Impaired flexibility, Increased fascial restricitons, Decreased strength, Decreased activity tolerance  Visit Diagnosis: 1. Other idiopathic scoliosis, lumbar region   2. Radiculopathy, lumbar region        Problem List Patient Active Problem List   Diagnosis Date Noted  . Central sleep apnea 10/09/2016  . Hypertension 10/09/2016  . Lumbar postlaminectomy syndrome 10/09/2016  . Lumbar radiculopathy 10/09/2016  . Diabetic polyneuropathy ([place]) 10/07/2016  . Right lumbosacral radiculopathy 10/07/2016  . Healthcare maintenance 08/26/2016  . DKA (diabetic ketoacidoses) ([place]) 06/14/2016  . Atypical chest pain 02/23/2016  . SOB (shortness of breath) 02/23/2016  . Pain medication agreement signed 01/13/2015  . Acquired hypothyroidism 05/30/2014  . Frontal sinusitis 12/17/2013  . Atrial fibrillation ([place]) 11/29/2013  . Major depression in remission ([place]) 11/20/2013  . 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 Chronic pain syndrome 08/27/2013  . Back pain 07/17/2013  . HTN (hypertension), benign 07/17/2013  . Hyperlipidemia 07/17/2013  . Pancreatic insufficiency 07/17/2013  . Chronic postoperative pain 01/01/2013  . Long term current use of opiate analgesic 11/04/2012  . Hypothyroidism 08/25/2012  . Radiculopathy of leg 05/01/2011   Shelton Silvas PT, DPT Shelton Silvas 09/07/2018, 11:11 AM  [facility] [address] [place], [address] Phone: [phone]   Fax:  [phone]  Name: JIAYI LENGACHER MRN: [id number] Date of Birth: February 26, 1950

## 2018-09-11 ENCOUNTER — Encounter: Payer: Self-pay | Admitting: Physical Therapy

## 2018-09-11 ENCOUNTER — Ambulatory Visit: Payer: Medicare Other | Admitting: Physical Therapy

## 2018-09-11 ENCOUNTER — Other Ambulatory Visit: Payer: Self-pay

## 2018-09-11 DIAGNOSIS — M4126 Other idiopathic scoliosis, lumbar region: Secondary | ICD-10-CM | POA: Diagnosis not present

## 2018-09-11 DIAGNOSIS — M5416 Radiculopathy, lumbar region: Secondary | ICD-10-CM

## 2018-09-11 NOTE — Therapy (Signed)
[facility] [address], [place], [address] Phone: [phone]   Fax:  [phone]  Physical Therapy Treatment  Patient Details  Name: Colleen Payne MRN: [id number] Date of Birth: 11-02-1950 Referring Provider (PT): Traci Sermon MD   Encounter Date: 09/11/2018  PT End of Session - 09/11/18 1010    Visit Number  25    Number of Visits  33    Date for PT Re-Evaluation  09/25/18    Authorization Time Period  3/10    PT Start Time  1005    PT Stop Time  1045    PT Time Calculation (min)  40 min    Behavior During Therapy  [facility] for tasks assessed/performed       Past Medical History:  Diagnosis Date  . Anxiety   . Atrial fibrillation ([place])   . Depression   . Diabetes mellitus without complication (HCC)    Type I   . Headache    stress. 1x/month  . Hypertension   . Insulin pump in place   . Motion sickness    cars  . MVP (mitral valve prolapse)   . Neuromuscular disorder (HCC)    leg weakness s/p back surgeries  . Thyroid disease     Past Surgical History:  Procedure Laterality Date  . BACK SURGERY  2004   3 sugeries between Sept and Nov, fusion L2-S1  . CATARACT EXTRACTION W/PHACO Left 11/23/2014   Procedure: CATARACT EXTRACTION PHACO AND INTRAOCULAR LENS PLACEMENT (IOC);  Surgeon: Leandrew Koyanagi, MD;  Location: [facility];  Service: Ophthalmology;  Laterality: Left;  DIABETIC - insulin pump    There were no vitals filed for this visit.  Subjective Assessment - 09/11/18 1009    Subjective  Patient reports her one sided back pain from "pulling it" feels better. Reports 7/10 pain with some R sided ant rib pain/pulling    Pertinent History  Patient is 68 yo female that complains of lumbar pain and inability to walk erect, as well as hip/abdominal pain. Patient with history of lumbar fusion (L2-S1). Xray results show: Fairly stable appearance of the levoscoliosis of the lower  thoracic and of the lumbar spine.Stable lateral subluxation toward the left of L2 with respect L3. Stable partial compression of the body of L1. Patient complains of frustration with physicians, and that her symptoms are worsening over time. 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 Reported that her pain and difficulty worsened after trying to pick her husband up about 1 year ago. Patient mentioned that she has been told that she has "spinal fluid pockets that are leaking". PMH also includes DM I with insulin pump,  afib, HTN,  HLD, chronic pain syndrome, SOB.    Limitations  Standing;Walking;House hold activities;Other (comment)    Diagnostic tests  see exray results: Fairly stable appearance of the levoscoliosis of the lower thoracic and lumbar spine. Stable lateral subluxation toward left of L2 with respect of L3, stable partial compression of the body of L1.    Patient Stated Goals  pain relief, standing erect    Currently in Pain?  Yes    Pain Onset  More than a month ago       Ther-Ex -Supine bilat hip flexor stretch progression with bilat hip flex to stretch last hold 91min - Prone press up 3x10 with breaks in elbow prop - Thoracic ext with hands behind head with  chest off table 3x 10 with cuing for scapular retraction with good carry over following - Matrix hip ext 40# 3x 10 each side with cuing for proper posture with good carry over following - Thoracic extover mat table 2x 10 better heel down, lack of TKE bilat with thoracic ext - Squat with TC of mat table, with pipe overhead 3x 10 with demo and heavy cuing initially for proper form (no knee valgus, TKE with hip ext for stand phase, neutral spine) with good carry over following                         PT Education - 09/11/18 1010    Education Details  therex form    Person(s) Educated  Patient    Methods  Explanation;Demonstration;Tactile cues;Verbal cues     Comprehension  Verbalized understanding;Returned demonstration;Verbal cues required;Tactile cues required       PT Short Term Goals - 07/27/18 0933      PT SHORT TERM GOAL #1   Title  Patient will be adherent to initial HEP at least 3x a week to improve functional strength and balance for better safety at home.    Time  4    Period  Weeks    Status  Achieved    Target Date  12/17/17        PT Long Term Goals - 08/31/18 1007      PT LONG TERM GOAL #1   Title   Patient will be independent in bending down towards floor and picking up small object (<5 pounds) and then stand back up with pain 5/10 or less to improve ability to pick up and clean up room at home    Baseline  08/31/18 7/10 able to pick up object    Time  8    Period  Weeks    Status  On-going      PT LONG TERM GOAL #2   Title  Patient will increase BLE gross strength to 4+/5 as to improve functional strength for independent gait, increased standing tolerance and increased ADL ability.    Baseline  08/31/18 R/L flexion: 5/5 ER 5/5 IR 4+/4+ ABD 5/5 Ext 4+/4    Time  8    Period  Weeks    Status  Achieved      PT LONG TERM GOAL #3   Title  Patient will report a worst pain of 5/10 with transfers of her husband to improve tolerance with ADLs     Baseline  08/31/18 8/10 pain with transfers    Time  8    Period  Weeks    Status  On-going      PT LONG TERM GOAL #4   Title  Pt will decrease 5TSTS to 10sec order to demonstrate clinically significant improvement in LE strength, and reduced fall risk    Baseline  08/31/18 12sec 07/31/18 13sec 07/20/18 15sec 06/10/18 16sec    Time  8    Period  Days    Status  On-going      PT LONG TERM GOAL #5   Title  Pt will increase 10MWT by at least 0.13 m/s in order to demonstrate clinically significant improvement in community ambulation    Baseline  07/31/18 0.67m/s 07/20/18 same 06/10/18 0.81m/s    Time  8    Period  Weeks    Status  On-going      PT LONG TERM GOAL #7   Title  Patient  will increase standing tolerance to 68min without increased pain in order to complete household chores/cooking    Baseline  08/31/18 Can stand for 56mins with 8/10 pain    Time  8    Period  Weeks    Status  On-going            Plan - 09/11/18 1042    Clinical Impression Statement  PT continued therex progression this session, with patient reporting no increased pain througout therex, only noted muscle fatigue. Patient is demonstrating good carry over of proper form/technique between sessions, but does require cuing for proper form/muscle activaiton, especially with compound/functional movements. PT will continue progression as able.    Stability/Clinical Decision Making  Evolving/Moderate complexity    Clinical Decision Making  Moderate    Rehab Potential  Fair    Clinical Impairments Affecting Rehab Potential  comorbidities, symptom duration, curvature degree    PT Frequency  2x / week    PT Duration  8 weeks    PT Treatment/Interventions  ADLs/Self Care Home Management;Aquatic Therapy;Cryotherapy;Ultrasound;Parrafin;Traction;Moist Heat;Electrical Stimulation;DME Instruction;Gait training;Stair training;Functional mobility training;Neuromuscular re-education;Balance training;Therapeutic exercise;Therapeutic activities;Patient/family education;Energy conservation;Spinal Manipulations;Joint Manipulations;Passive range of motion;Dry needling;Manual techniques    PT Next Visit Plan  Progress Hip and core strengthening as tolerated    PT Home Exercise Plan  Added standing pec stretch in doorway to HEP today       Patient will benefit from skilled therapeutic intervention in order to improve the following deficits and impairments:  Abnormal gait, Decreased balance, Decreased endurance, Decreased mobility, Difficulty walking, Hypomobility, Increased muscle spasms, Decreased range of motion, Pain, Postural dysfunction, Impaired flexibility, Increased fascial restricitons, Decreased strength,  Decreased activity tolerance  Visit Diagnosis: Other idiopathic scoliosis, lumbar region  Radiculopathy, lumbar region     Problem List Patient Active Problem List   Diagnosis Date Noted  . Central sleep apnea 10/09/2016  . Hypertension 10/09/2016  . Lumbar postlaminectomy syndrome 10/09/2016  . Lumbar radiculopathy 10/09/2016  . Diabetic polyneuropathy ([place]) 10/07/2016  . Right lumbosacral radiculopathy 10/07/2016  . Healthcare maintenance 08/26/2016  . DKA (diabetic ketoacidoses) ([place]) 06/14/2016  . Atypical chest pain 02/23/2016  . SOB (shortness of breath) 02/23/2016  . Pain medication agreement signed 01/13/2015  . Acquired hypothyroidism 05/30/2014  . Frontal sinusitis 12/17/2013  . Atrial fibrillation ([place]) 11/29/2013  . Major depression in remission ([place]) 11/20/2013  . 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 Chronic pain syndrome 08/27/2013  . Back pain 07/17/2013  . HTN (hypertension), benign 07/17/2013  . Hyperlipidemia 07/17/2013  . Pancreatic insufficiency 07/17/2013  . Chronic postoperative pain 01/01/2013  . Long term current use of opiate analgesic 11/04/2012  . Hypothyroidism 08/25/2012  . Radiculopathy of leg 05/01/2011   Shelton Silvas PT, DPT Shelton Silvas 09/11/2018, 10:44 AM  [facility] [address] [place], [address] Phone: [phone]   Fax:  [phone]  Name: Colleen Payne MRN: [id number] Date of Birth: 10/20/1950

## 2018-09-14 ENCOUNTER — Ambulatory Visit: Payer: Medicare Other | Admitting: Physical Therapy

## 2018-09-18 ENCOUNTER — Encounter: Payer: Self-pay | Admitting: Physical Therapy

## 2018-09-18 ENCOUNTER — Other Ambulatory Visit: Payer: Self-pay

## 2018-09-18 ENCOUNTER — Ambulatory Visit: Payer: Medicare Other | Admitting: Physical Therapy

## 2018-09-18 DIAGNOSIS — M4126 Other idiopathic scoliosis, lumbar region: Secondary | ICD-10-CM | POA: Diagnosis not present

## 2018-09-18 DIAGNOSIS — M5416 Radiculopathy, lumbar region: Secondary | ICD-10-CM

## 2018-09-18 NOTE — Therapy (Signed)
[facility] [address], [place], [address] Phone: [phone]   Fax:  [phone]  Physical Therapy Treatment  Patient Details  Name: Colleen Payne MRN: [id number] Date of Birth: 1950-10-08 Referring Provider (PT): Traci Sermon MD   Encounter Date: 09/18/2018  PT End of Session - 09/18/18 1007    Visit Number  26    Number of Visits  33    Date for PT Re-Evaluation  09/25/18    Authorization Type  Medicare    Authorization Time Period  4/10    PT Start Time  1000    PT Stop Time  1040    PT Time Calculation (min)  40 min    Activity Tolerance  Patient tolerated treatment well    Behavior During Therapy  [facility] for tasks assessed/performed       Past Medical History:  Diagnosis Date  . Anxiety   . Atrial fibrillation ([place])   . Depression   . Diabetes mellitus without complication (HCC)    Type I   . Headache    stress. 1x/month  . Hypertension   . Insulin pump in place   . Motion sickness    cars  . MVP (mitral valve prolapse)   . Neuromuscular disorder (HCC)    leg weakness s/p back surgeries  . Thyroid disease     Past Surgical History:  Procedure Laterality Date  . BACK SURGERY  2004   3 sugeries between Sept and Nov, fusion L2-S1  . CATARACT EXTRACTION W/PHACO Left 11/23/2014   Procedure: CATARACT EXTRACTION PHACO AND INTRAOCULAR LENS PLACEMENT (IOC);  Surgeon: Leandrew Koyanagi, MD;  Location: [facility];  Service: Ophthalmology;  Laterality: Left;  DIABETIC - insulin pump    There were no vitals filed for this visit.  Subjective Assessment - 09/18/18 1005    Subjective  Reports where she pulled her back it feels better. 7/10 generalized back pain this am.    Pertinent History  Patient is 68 yo female that complains of lumbar pain and inability to walk erect, as well as hip/abdominal pain. Patient with history of lumbar fusion (L2-S1). Xray results show: Fairly  stable appearance of the levoscoliosis of the lower thoracic and of the lumbar spine.Stable lateral subluxation toward the left of L2 with respect L3. Stable partial compression of the body of L1. Patient complains of frustration with physicians, and that her symptoms are worsening over time. 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 Reported that her pain and difficulty worsened after trying to pick her husband up about 1 year ago. Patient mentioned that she has been told that she has "spinal fluid pockets that are leaking". PMH also includes DM I with insulin pump,  afib, HTN,  HLD, chronic pain syndrome, SOB.    Limitations  Standing;Walking;House hold activities;Other (comment)    Diagnostic tests  see exray results: Fairly stable appearance of the levoscoliosis of the lower thoracic and lumbar spine. Stable lateral subluxation toward left of L2 with respect of L3, stable partial compression of the body of L1.    Patient Stated Goals  pain relief, standing erect    Pain Onset  More than a month ago       Ther-Ex -Supine bilat hip flexor stretch progression with bilat hip flex to stretch last hold 72min - Thoracic ext with hands behind head with chest off table 3x 10 with cuing for  scapular retraction with good carry over following - Birddog 3x 8 with stabilization at hips to prevent LOB, unable to maintain without, childs pose active rest between  - Thoracic extover mat table 2x 10 Stabilization at knees for ext and gait belt support at hips for hip ext - Squat with TC of mat table x8; with pipe overhead 2x 10 with cuing for proper form, TC to prevent knee valgus, VC for full available hip + knee ext, decent carry over following - Backward walking RTB with HHA behind pt to promote thoracic and hip ext with cuing for slow, large steps, good carry over [id number]                        PT Education - 09/18/18 1006    Education Details   therex form    Person(s) Educated  Patient    Methods  Explanation;Demonstration;Verbal cues    Comprehension  Verbalized understanding;Returned demonstration;Verbal cues required       PT Short Term Goals - 07/27/18 0933      PT SHORT TERM GOAL #1   Title  Patient will be adherent to initial HEP at least 3x a week to improve functional strength and balance for better safety at home.    Time  4    Period  Weeks    Status  Achieved    Target Date  12/17/17        PT Long Term Goals - 08/31/18 1007      PT LONG TERM GOAL #1   Title   Patient will be independent in bending down towards floor and picking up small object (<5 pounds) and then stand back up with pain 5/10 or less to improve ability to pick up and clean up room at home    Baseline  08/31/18 7/10 able to pick up object    Time  8    Period  Weeks    Status  On-going      PT LONG TERM GOAL #2   Title  Patient will increase BLE gross strength to 4+/5 as to improve functional strength for independent gait, increased standing tolerance and increased ADL ability.    Baseline  08/31/18 R/L flexion: 5/5 ER 5/5 IR 4+/4+ ABD 5/5 Ext 4+/4    Time  8    Period  Weeks    Status  Achieved      PT LONG TERM GOAL #3   Title  Patient will report a worst pain of 5/10 with transfers of her husband to improve tolerance with ADLs     Baseline  08/31/18 8/10 pain with transfers    Time  8    Period  Weeks    Status  On-going      PT LONG TERM GOAL #4   Title  Pt will decrease 5TSTS to 10sec order to demonstrate clinically significant improvement in LE strength, and reduced fall risk    Baseline  08/31/18 12sec 07/31/18 13sec 07/20/18 15sec 06/10/18 16sec    Time  8    Period  Days    Status  On-going      PT LONG TERM GOAL #5   Title  Pt will increase 10MWT by at least 0.13 m/s in order to demonstrate clinically significant improvement in community ambulation    Baseline  07/31/18 0.57m/s 07/20/18 same 06/10/18 0.73m/s    Time  8     Period  Weeks    Status  On-going      PT LONG TERM GOAL #7   Title  Patient will increase standing tolerance to 53min without increased pain in order to complete household chores/cooking    Baseline  08/31/18 Can stand for 82mins with 8/10 pain    Time  8    Period  Weeks    Status  On-going            Plan - 09/18/18 1040    Clinical Impression Statement  PT continued therex progression with good carry over of proper technique between sessions, and cuing. Patient reports 6/10 pain at the end of session, but is able to walk much more erect. PT will continue progression as able.    Stability/Clinical Decision Making  Evolving/Moderate complexity    Clinical Decision Making  Moderate    Rehab Potential  Fair    Clinical Impairments Affecting Rehab Potential  comorbidities, symptom duration, curvature degree    PT Frequency  2x / week    PT Duration  8 weeks    PT Treatment/Interventions  ADLs/Self Care Home Management;Aquatic Therapy;Cryotherapy;Ultrasound;Parrafin;Traction;Moist Heat;Electrical Stimulation;DME Instruction;Gait training;Stair training;Functional mobility training;Neuromuscular re-education;Balance training;Therapeutic exercise;Therapeutic activities;Patient/family education;Energy conservation;Spinal Manipulations;Joint Manipulations;Passive range of motion;Dry needling;Manual techniques    PT Next Visit Plan  Progress Hip and core strengthening as tolerated    PT Home Exercise Plan  Added standing pec stretch in doorway to HEP today    Consulted and Agree with Plan of Care  Patient       Patient will benefit from skilled therapeutic intervention in order to improve the following deficits and impairments:  Abnormal gait, Decreased balance, Decreased endurance, Decreased mobility, Difficulty walking, Hypomobility, Increased muscle spasms, Decreased range of motion, Pain, Postural dysfunction, Impaired flexibility, Increased fascial restricitons, Decreased strength,  Decreased activity tolerance  Visit Diagnosis: Other idiopathic scoliosis, lumbar region  Radiculopathy, lumbar region     Problem List Patient Active Problem List   Diagnosis Date Noted  . Central sleep apnea 10/09/2016  . Hypertension 10/09/2016  . Lumbar postlaminectomy syndrome 10/09/2016  . Lumbar radiculopathy 10/09/2016  . Diabetic polyneuropathy ([place]) 10/07/2016  . Right lumbosacral radiculopathy 10/07/2016  . Healthcare maintenance 08/26/2016  . DKA (diabetic ketoacidoses) ([place]) 06/14/2016  . Atypical chest pain 02/23/2016  . SOB (shortness of breath) 02/23/2016  . Pain medication agreement signed 01/13/2015  . Acquired hypothyroidism 05/30/2014  . Frontal sinusitis 12/17/2013  . Atrial fibrillation ([place]) 11/29/2013  . Major depression in remission ([place]) 11/20/2013  . 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 Chronic pain syndrome 08/27/2013  . Back pain 07/17/2013  . HTN (hypertension), benign 07/17/2013  . Hyperlipidemia 07/17/2013  . Pancreatic insufficiency 07/17/2013  . Chronic postoperative pain 01/01/2013  . Long term current use of opiate analgesic 11/04/2012  . Hypothyroidism 08/25/2012  . Radiculopathy of leg 05/01/2011   Shelton Silvas PT, DPT Shelton Silvas 09/18/2018, 10:46 AM  [facility] [address], [place], [address] Phone: [phone]   Fax:  [phone]  Name: Colleen Payne MRN: [id number] Date of Birth: 1950/06/24

## 2018-09-21 ENCOUNTER — Other Ambulatory Visit: Payer: Self-pay

## 2018-09-21 ENCOUNTER — Encounter: Payer: Self-pay | Admitting: Physical Therapy

## 2018-09-21 ENCOUNTER — Ambulatory Visit: Payer: Medicare Other | Admitting: Physical Therapy

## 2018-09-21 DIAGNOSIS — M4126 Other idiopathic scoliosis, lumbar region: Secondary | ICD-10-CM

## 2018-09-21 DIAGNOSIS — M5416 Radiculopathy, lumbar region: Secondary | ICD-10-CM

## 2018-09-21 NOTE — Therapy (Signed)
[facility] [address] [place], [address] Phone: [phone]   Fax:  [phone]  Physical Therapy Treatment  Patient Details  Name: Colleen Payne MRN: [id number] Date of Birth: May 16, 1950 Referring Provider (PT): Traci Sermon MD   Encounter Date: 09/21/2018  PT End of Session - 09/21/18 0928    Visit Number  27    Number of Visits  33    Date for PT Re-Evaluation  09/25/18    Authorization Type  Medicare    Authorization Time Period  5/10    PT Start Time  0915    PT Stop Time  0953    PT Time Calculation (min)  38 min    Activity Tolerance  Patient tolerated treatment well    Behavior During Therapy  [facility] for tasks assessed/performed       Past Medical History:  Diagnosis Date  . Anxiety   . Atrial fibrillation ([place])   . Depression   . Diabetes mellitus without complication (HCC)    Type I   . Headache    stress. 1x/month  . Hypertension   . Insulin pump in place   . Motion sickness    cars  . MVP (mitral valve prolapse)   . Neuromuscular disorder (HCC)    leg weakness s/p back surgeries  . Thyroid disease     Past Surgical History:  Procedure Laterality Date  . BACK SURGERY  2004   3 sugeries between Sept and Nov, fusion L2-S1  . CATARACT EXTRACTION W/PHACO Left 11/23/2014   Procedure: CATARACT EXTRACTION PHACO AND INTRAOCULAR LENS PLACEMENT (IOC);  Surgeon: Leandrew Koyanagi, MD;  Location: [facility];  Service: Ophthalmology;  Laterality: Left;  DIABETIC - insulin pump    There were no vitals filed for this visit.  Subjective Assessment - 09/21/18 0921    Subjective  Reports 7/10 pain in low back this am. Compliance with HEP.    Pertinent History  Patient is 68 yo female that complains of lumbar pain and inability to walk erect, as well as hip/abdominal pain. Patient with history of lumbar fusion (L2-S1). Xray results show: Fairly stable appearance of the  levoscoliosis of the lower thoracic and of the lumbar spine.Stable lateral subluxation toward the left of L2 with respect L3. Stable partial compression of the body of L1. Patient complains of frustration with physicians, and that her symptoms are worsening over time. 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 Reported that her pain and difficulty worsened after trying to pick her husband up about 1 year ago. Patient mentioned that she has been told that she has "spinal fluid pockets that are leaking". PMH also includes DM I with insulin pump,  afib, HTN,  HLD, chronic pain syndrome, SOB.    Limitations  Standing;Walking;House hold activities;Other (comment)    Diagnostic tests  see exray results: Fairly stable appearance of the levoscoliosis of the lower thoracic and lumbar spine. Stable lateral subluxation toward left of L2 with respect of L3, stable partial compression of the body of L1.    Patient Stated Goals  pain relief, standing erect       Ther-Ex -Supine bilat hip flexor stretch progression in modified thomas 87min  - Prone alt supermans 3x 8 each side with minimal lift of LUE, good glute activation following cuing (attempted full superman, unable) - Thoracic ext with hands behind head with chest off table 3x 10  with cuing for scapular retraction with good carry over following; PT stabilization at LEs; (attempted prone, unable) - Hip swings flex to ext 2x 10 each LE, unilateral UE support; hip rotation swings 2x 10 each LE with UE support - Thoracic extover mat table 2x 10Stabilization at knees for ext and gait belt support at hips for hip ext; pt feels as though she is falling forward - Backward walking RTB with HHA behind pt to promote thoracic and hip ext with cuing for slow, large steps, good carry over [id number]                        PT Education - 09/21/18 0923    Education Details  therex form/technique    Person(s)  Educated  Patient    Methods  Explanation;Demonstration;Verbal cues    Comprehension  Verbalized understanding;Returned demonstration;Verbal cues required       PT Short Term Goals - 07/27/18 0933      PT SHORT TERM GOAL #1   Title  Patient will be adherent to initial HEP at least 3x a week to improve functional strength and balance for better safety at home.    Time  4    Period  Weeks    Status  Achieved    Target Date  12/17/17        PT Long Term Goals - 08/31/18 1007      PT LONG TERM GOAL #1   Title   Patient will be independent in bending down towards floor and picking up small object (<5 pounds) and then stand back up with pain 5/10 or less to improve ability to pick up and clean up room at home    Baseline  08/31/18 7/10 able to pick up object    Time  8    Period  Weeks    Status  On-going      PT LONG TERM GOAL #2   Title  Patient will increase BLE gross strength to 4+/5 as to improve functional strength for independent gait, increased standing tolerance and increased ADL ability.    Baseline  08/31/18 R/L flexion: 5/5 ER 5/5 IR 4+/4+ ABD 5/5 Ext 4+/4    Time  8    Period  Weeks    Status  Achieved      PT LONG TERM GOAL #3   Title  Patient will report a worst pain of 5/10 with transfers of her husband to improve tolerance with ADLs     Baseline  08/31/18 8/10 pain with transfers    Time  8    Period  Weeks    Status  On-going      PT LONG TERM GOAL #4   Title  Pt will decrease 5TSTS to 10sec order to demonstrate clinically significant improvement in LE strength, and reduced fall risk    Baseline  08/31/18 12sec 07/31/18 13sec 07/20/18 15sec 06/10/18 16sec    Time  8    Period  Days    Status  On-going      PT LONG TERM GOAL #5   Title  Pt will increase 10MWT by at least 0.13 m/s in order to demonstrate clinically significant improvement in community ambulation    Baseline  07/31/18 0.42m/s 07/20/18 same 06/10/18 0.62m/s    Time  8    Period  Weeks    Status   On-going      PT LONG TERM GOAL #7   Title  Patient  will increase standing tolerance to 43min without increased pain in order to complete household chores/cooking    Baseline  08/31/18 Can stand for 29mins with 8/10 pain    Time  8    Period  Weeks    Status  On-going            Plan - 09/21/18 1011    Clinical Impression Statement  PT continued therex progression for ext strengthening and posture with success. Patient is able to complete all therex with no increased pain, with noted muscle fatigue. Patient is increasing her tolerance for therex and able to increase intensity/reps, with some difficulty with progressions against gravity. Patient is motivated throughout session and is making slow, steady improvements. PT will continue progression as able.    Stability/Clinical Decision Making  Evolving/Moderate complexity    Clinical Decision Making  Moderate    Rehab Potential  Fair    Clinical Impairments Affecting Rehab Potential  comorbidities, symptom duration, curvature degree    PT Frequency  2x / week    PT Duration  8 weeks    PT Treatment/Interventions  ADLs/Self Care Home Management;Aquatic Therapy;Cryotherapy;Ultrasound;Parrafin;Traction;Moist Heat;Electrical Stimulation;DME Instruction;Gait training;Stair training;Functional mobility training;Neuromuscular re-education;Balance training;Therapeutic exercise;Therapeutic activities;Patient/family education;Energy conservation;Spinal Manipulations;Joint Manipulations;Passive range of motion;Dry needling;Manual techniques    PT Next Visit Plan  Progress Hip and core strengthening as tolerated    PT Home Exercise Plan  Added standing pec stretch in doorway to HEP today    Consulted and Agree with Plan of Care  Patient       Patient will benefit from skilled therapeutic intervention in order to improve the following deficits and impairments:  Abnormal gait, Decreased balance, Decreased endurance, Decreased mobility, Difficulty  walking, Hypomobility, Increased muscle spasms, Decreased range of motion, Pain, Postural dysfunction, Impaired flexibility, Increased fascial restricitons, Decreased strength, Decreased activity tolerance  Visit Diagnosis: Other idiopathic scoliosis, lumbar region  Radiculopathy, lumbar region     Problem List Patient Active Problem List   Diagnosis Date Noted  . Central sleep apnea 10/09/2016  . Hypertension 10/09/2016  . Lumbar postlaminectomy syndrome 10/09/2016  . Lumbar radiculopathy 10/09/2016  . Diabetic polyneuropathy ([place]) 10/07/2016  . Right lumbosacral radiculopathy 10/07/2016  . Healthcare maintenance 08/26/2016  . DKA (diabetic ketoacidoses) ([place]) 06/14/2016  . Atypical chest pain 02/23/2016  . SOB (shortness of breath) 02/23/2016  . Pain medication agreement signed 01/13/2015  . Acquired hypothyroidism 05/30/2014  . Frontal sinusitis 12/17/2013  . Atrial fibrillation ([place]) 11/29/2013  . Major depression in remission ([place]) 11/20/2013  . 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 Chronic pain syndrome 08/27/2013  . Back pain 07/17/2013  . HTN (hypertension), benign 07/17/2013  . Hyperlipidemia 07/17/2013  . Pancreatic insufficiency 07/17/2013  . Chronic postoperative pain 01/01/2013  . Long term current use of opiate analgesic 11/04/2012  . Hypothyroidism 08/25/2012  . Radiculopathy of leg 05/01/2011   Shelton Silvas PT, DPT Shelton Silvas 09/21/2018, 10:15 AM  [facility] [address], [place], [address] Phone: [phone]   Fax:  [phone]  Name: Colleen Payne MRN: [id number] Date of Birth: 07-26-1950

## 2018-09-25 ENCOUNTER — Encounter: Payer: Self-pay | Admitting: Physical Therapy

## 2018-09-25 ENCOUNTER — Ambulatory Visit: Payer: Medicare Other | Attending: Anesthesiology | Admitting: Physical Therapy

## 2018-09-25 ENCOUNTER — Other Ambulatory Visit: Payer: Self-pay

## 2018-09-25 DIAGNOSIS — M4126 Other idiopathic scoliosis, lumbar region: Secondary | ICD-10-CM | POA: Diagnosis not present

## 2018-09-25 DIAGNOSIS — M5416 Radiculopathy, lumbar region: Secondary | ICD-10-CM | POA: Diagnosis present

## 2018-09-25 NOTE — Therapy (Addendum)
[facility] [address] [place], [address] Phone: [phone]   Fax:  [phone]  Physical Therapy Treatment/Re-Assessment  Reporting Period 08/26/18 - 09/25/18  Patient Details  Name: Colleen Payne MRN: [id number] Date of Birth: 05-14-1950 Referring Provider (PT): Traci Sermon MD   Encounter Date: 09/25/2018  PT End of Session - 09/25/18 1011    Visit Number  28    Number of Visits  46    Date for PT Re-Evaluation  11/19/18    Authorization Type  Medicare    Authorization Time Period  6/10    PT Start Time  1000    PT Stop Time  1045    PT Time Calculation (min)  45 min    Activity Tolerance  Patient tolerated treatment well    Behavior During Therapy  [facility] for tasks assessed/performed       Past Medical History:  Diagnosis Date  . Anxiety   . Atrial fibrillation ([place])   . Depression   . Diabetes mellitus without complication (HCC)    Type I   . Headache    stress. 1x/month  . Hypertension   . Insulin pump in place   . Motion sickness    cars  . MVP (mitral valve prolapse)   . Neuromuscular disorder (HCC)    leg weakness s/p back surgeries  . Thyroid disease     Past Surgical History:  Procedure Laterality Date  . BACK SURGERY  2004   3 sugeries between Sept and Nov, fusion L2-S1  . CATARACT EXTRACTION W/PHACO Left 11/23/2014   Procedure: CATARACT EXTRACTION PHACO AND INTRAOCULAR LENS PLACEMENT (IOC);  Surgeon: Leandrew Koyanagi, MD;  Location: [facility];  Service: Ophthalmology;  Laterality: Left;  DIABETIC - insulin pump    There were no vitals filed for this visit.  Subjective Assessment - 09/25/18 1025    Subjective  Reports 8/10 pain today. COmpliance with HEP. Is continuing to work on posture    Pertinent History  Patient is 68 yo female that complains of lumbar pain and inability to walk erect, as well as hip/abdominal pain. Patient with history of lumbar  fusion (L2-S1). Xray results show: Fairly stable appearance of the levoscoliosis of the lower thoracic and of the lumbar spine.Stable lateral subluxation toward the left of L2 with respect L3. Stable partial compression of the body of L1. Patient complains of frustration with physicians, and that her symptoms are worsening over time. 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 Reported that her pain and difficulty worsened after trying to pick her husband up about 1 year ago. Patient mentioned that she has been told that she has "spinal fluid pockets that are leaking". PMH also includes DM I with insulin pump,  afib, HTN,  HLD, chronic pain syndrome, SOB.    Limitations  Standing;Walking;House hold activities;Other (comment)    Diagnostic tests  see exray results: Fairly stable appearance of the levoscoliosis of the lower thoracic and lumbar spine. Stable lateral subluxation toward left of L2 with respect of L3, stable partial compression of the body of L1.    Patient Stated Goals  pain relief, standing erect    Pain Onset  More than a month ago      Ther-Ex -Supine bilat hip flexor stretch progression in modified thomas 11min  - Bird dog 3x 6/7/ with stabilization needed to prevent LOB, more with RUE/LLE, PT decreasing TC  as able for safety - Thoracic ext with hands behind head with chest off table 3x 10 with cuing for scapular retraction with good carry over following; PT stabilization at LEs; (attempted prone, unable) - Hip swings flex to ext 2x 10 each LE, unilateral 2 finger support; cuing for upright trunk, good carry over - Hip rotation swings 2x 10 each LE with 19finger support, cuing for full available rotation with good carry over - Squat with TC of mat table with prior overhead for upright trunk 3x 10 with min cuing initially to prevent R knee valgus with good carry over following                          PT Education - 09/25/18  1027    Education Details  therex form/technique    Person(s) Educated  Patient    Methods  Explanation;Demonstration;Tactile cues;Verbal cues    Comprehension  Verbalized understanding;Returned demonstration;Verbal cues required;Tactile cues required       PT Short Term Goals - 07/27/18 0933      PT SHORT TERM GOAL #1   Title  Patient will be adherent to initial HEP at least 3x a week to improve functional strength and balance for better safety at home.    Time  4    Period  Weeks    Status  Achieved    Target Date  12/17/17        PT Long Term Goals - 09/25/18 1012      PT LONG TERM GOAL #1   Title  Patient will be independent in bending down towards floor and picking up small object (<5 pounds) and then stand back up without increased pain to improve ability to pick up and clean up room at home    Baseline  09/25/18/20 able to pick up object without increase in pain, though she reports continued baseline pain, goal revised and achieved    Time  8    Period  Weeks    Status  Achieved      PT LONG TERM GOAL #2   Title  Patient will increase BLE gross strength to 4+/5 as to improve functional strength for independent gait, increased standing tolerance and increased ADL ability.    Baseline  08/31/18 R/L flexion: 5/5 ER 5/5 IR 4+/4+ ABD 5/5 Ext 4+/4    Time  8    Period  Weeks    Status  Achieved      PT LONG TERM GOAL #3   Title  Patient will report a worst pain of 5/10 with transfers of her husband to improve tolerance with ADLs     Baseline  09/25/18 8/10 pain with transfers    Time  8    Period  Weeks    Status  On-going      PT LONG TERM GOAL #4   Title  Pt will decrease 5TSTS to 10sec order to demonstrate clinically significant improvement in LE strength, and reduced fall risk    Baseline  09/25/18 11sec 08/31/18 12sec 07/31/18 13sec 07/20/18 15sec 06/10/18 16sec    Time  8    Period  Days    Status  On-going      PT LONG TERM GOAL #5   Title  Pt will increase 10MWT by at  least 0.13 m/s in order to demonstrate clinically significant improvement in community ambulation    Baseline  09/25/18 0.83 07/31/18 0.54m/s 07/20/18 same 06/10/18 0.7m/s  Time  8    Period  Weeks    Status  Achieved      Additional Long Term Goals   Additional Long Term Goals  Yes      PT LONG TERM GOAL #6   Title  Patient will demonstrate walk speed of 1.2 to demonstrate community ambulation norm    Baseline  09/25/18 0.2m/s    Time  8    Period  Weeks    Status  New      PT LONG TERM GOAL #7   Title  Patient will increase standing tolerance to 21min without increased pain in order to complete household chores/cooking    Baseline  09/25/18 Can stand for 3min with minimal increased pain, but reports it does continue to steadily worsen over 71mins    Time  8    Period  Weeks    Status  On-going            Plan - 09/25/18 1042    Clinical Impression Statement  PT reassessed goals this session with patient demonstrating clinically significant improvement in walking speed and LE strength, but not quite to community norms, and decreased fall risk norms. Patient is continuing to make slow but steady progress and show regression without therapy when se is unable to make appointments (when something arises with her husband, whom she is the caretaker for). Patient continues to report 7-8/10 pain with all activity but is beginning to report that pain does not increase with some ADL tasks. PT believes this pain level may be a baseline for patient d/t other factors impacting pain experience (sleep hygiene, stress, lack of physical activity), PT has discussed this with pt, but patient feels as though these factors are not in her control and is not in a place to begin to work toward a solution. Pt will continue to benefit from PT education on this and skilled therapy sessions to address physical impairments of inadequate length/tension, relationships as they affect posture and ADL tolerance. Patient  motivated to continue therapy and  understanding of all education provided.    Stability/Clinical Decision Making  Evolving/Moderate complexity    Clinical Decision Making  Moderate    Rehab Potential  Fair    Clinical Impairments Affecting Rehab Potential  comorbidities, symptom duration, curvature degree    PT Frequency  2x / week    PT Duration  8 weeks    PT Treatment/Interventions  ADLs/Self Care Home Management;Aquatic Therapy;Cryotherapy;Ultrasound;Parrafin;Traction;Moist Heat;Electrical Stimulation;DME Instruction;Gait training;Stair training;Functional mobility training;Neuromuscular re-education;Balance training;Therapeutic exercise;Therapeutic activities;Patient/family education;Energy conservation;Spinal Manipulations;Joint Manipulations;Passive range of motion;Dry needling;Manual techniques    PT Next Visit Plan  Progress Hip and core strengthening as tolerated    PT Home Exercise Plan  Added standing pec stretch in doorway to HEP today    Consulted and Agree with Plan of Care  Patient       Patient will benefit from skilled therapeutic intervention in order to improve the following deficits and impairments:  Abnormal gait, Decreased balance, Decreased endurance, Decreased mobility, Difficulty walking, Hypomobility, Increased muscle spasms, Decreased range of motion, Pain, Postural dysfunction, Impaired flexibility, Increased fascial restricitons, Decreased strength, Decreased activity tolerance  Visit Diagnosis: Other idiopathic scoliosis, lumbar region  Radiculopathy, lumbar region     Problem List Patient Active Problem List   Diagnosis Date Noted  . Central sleep apnea 10/09/2016  . Hypertension 10/09/2016  . Lumbar postlaminectomy syndrome 10/09/2016  . Lumbar radiculopathy 10/09/2016  . Diabetic polyneuropathy ([place]) 10/07/2016  . Right lumbosacral  radiculopathy 10/07/2016  . Healthcare maintenance 08/26/2016  . DKA (diabetic ketoacidoses) ([place]) 06/14/2016  .  Atypical chest pain 02/23/2016  . SOB (shortness of breath) 02/23/2016  . Pain medication agreement signed 01/13/2015  . Acquired hypothyroidism 05/30/2014  . Frontal sinusitis 12/17/2013  . Atrial fibrillation ([place]) 11/29/2013  . Major depression in remission ([place]) 11/20/2013  . 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 Chronic pain syndrome 08/27/2013  . Back pain 07/17/2013  . HTN (hypertension), benign 07/17/2013  . Hyperlipidemia 07/17/2013  . Pancreatic insufficiency 07/17/2013  . Chronic postoperative pain 01/01/2013  . Long term current use of opiate analgesic 11/04/2012  . Hypothyroidism 08/25/2012  . Radiculopathy of leg 05/01/2011   Shelton Silvas PT, DPT Shelton Silvas 09/25/2018, 10:54 AM  [facility] [address] [place], [address] Phone: [phone]   Fax:  [phone]  Name: Colleen Payne MRN: [id number] Date of Birth: 11-27-1950

## 2018-09-30 ENCOUNTER — Ambulatory Visit: Payer: Medicare Other | Admitting: Physical Therapy

## 2018-09-30 ENCOUNTER — Encounter: Payer: Self-pay | Admitting: Physical Therapy

## 2018-09-30 ENCOUNTER — Other Ambulatory Visit: Payer: Self-pay

## 2018-09-30 DIAGNOSIS — M5416 Radiculopathy, lumbar region: Secondary | ICD-10-CM

## 2018-09-30 DIAGNOSIS — M4126 Other idiopathic scoliosis, lumbar region: Secondary | ICD-10-CM

## 2018-09-30 NOTE — Therapy (Signed)
[facility] [address] [place], [address] Phone: [phone]   Fax:  [phone]  Physical Therapy Treatment  Patient Details  Name: Colleen Payne MRN: [id number] Date of Birth: 07-Feb-1950 Referring Provider (PT): Traci Sermon MD   Encounter Date: 09/30/2018  PT End of Session - 09/30/18 1143    Visit Number  29    Number of Visits  46    Date for PT Re-Evaluation  11/19/18    Authorization Type  Medicare    Authorization Time Period  7/10    PT Start Time  1115    PT Stop Time  1200    PT Time Calculation (min)  45 min    Activity Tolerance  Patient tolerated treatment well    Behavior During Therapy  [facility] for tasks assessed/performed       Past Medical History:  Diagnosis Date  . Anxiety   . Atrial fibrillation ([place])   . Depression   . Diabetes mellitus without complication (HCC)    Type I   . Headache    stress. 1x/month  . Hypertension   . Insulin pump in place   . Motion sickness    cars  . MVP (mitral valve prolapse)   . Neuromuscular disorder (HCC)    leg weakness s/p back surgeries  . Thyroid disease     Past Surgical History:  Procedure Laterality Date  . BACK SURGERY  2004   3 sugeries between Sept and Nov, fusion L2-S1  . CATARACT EXTRACTION W/PHACO Left 11/23/2014   Procedure: CATARACT EXTRACTION PHACO AND INTRAOCULAR LENS PLACEMENT (IOC);  Surgeon: Leandrew Koyanagi, MD;  Location: [place];  Service: Ophthalmology;  Laterality: Left;  DIABETIC - insulin pump    There were no vitals filed for this visit.  Subjective Assessment - 09/30/18 1126    Subjective  Reports 7/10 pain today. Reports she has been sleeping on her cot instead of her          Ther-Ex -Supine bilat hip flexor stretch progressionin modified thomas 64min  - Bird dog 3x 8 with stabilization needed to prevent LOB, more with RUE/LLE, PT decreasing TC as able for safety, rest in  childs pose 61min in between - Thoracic ext with UE in T position attempting to lift chest off table 3x 10; attempted with LE lift, unable - Mini squat with pipe overhead to promote thoracic ext and upright posture 3x 10; min cuing initially for RLE stabilization, and even WB with good carry over following - Rows in half kneeling x10 each LE forward with GTB, Stabilization at R knee with R half kneeling needing for safety, cuing for upright posture and scap retraction with good carry over - Hip ext in kneeling blackTB x10; grey TB 2x 10 with cuing for full ext and upright posture with PT holding resistance band, good carry over folloiwngover                        PT Education - 09/30/18 1135    Education Details  therex form    Person(s) Educated  Patient    Methods  Explanation;Demonstration;Verbal cues;Tactile cues    Comprehension  Verbalized understanding;Returned demonstration;Verbal cues required;Tactile cues required       PT Short Term Goals - 07/27/18 0933      PT SHORT TERM GOAL #1   Title  Patient will be adherent to initial HEP at least  3x a week to improve functional strength and balance for better safety at home.    Time  4    Period  Weeks    Status  Achieved    Target Date  12/17/17        PT Long Term Goals - 09/25/18 1012      PT LONG TERM GOAL #1   Title  Patient will be independent in bending down towards floor and picking up small object (<5 pounds) and then stand back up without increased pain to improve ability to pick up and clean up room at home    Baseline  09/25/18/20 able to pick up object without increase in pain, though she reports continued baseline pain, goal revised and achieved    Time  8    Period  Weeks    Status  Achieved      PT LONG TERM GOAL #2   Title  Patient will increase BLE gross strength to 4+/5 as to improve functional strength for independent gait, increased standing tolerance and increased ADL ability.     Baseline  08/31/18 R/L flexion: 5/5 ER 5/5 IR 4+/4+ ABD 5/5 Ext 4+/4    Time  8    Period  Weeks    Status  Achieved      PT LONG TERM GOAL #3   Title  Patient will report a worst pain of 5/10 with transfers of her husband to improve tolerance with ADLs     Baseline  09/25/18 8/10 pain with transfers    Time  8    Period  Weeks    Status  On-going      PT LONG TERM GOAL #4   Title  Pt will decrease 5TSTS to 10sec order to demonstrate clinically significant improvement in LE strength, and reduced fall risk    Baseline  09/25/18 11sec 08/31/18 12sec 07/31/18 13sec 07/20/18 15sec 06/10/18 16sec    Time  8    Period  Days    Status  On-going      PT LONG TERM GOAL #5   Title  Pt will increase 10MWT by at least 0.13 m/s in order to demonstrate clinically significant improvement in community ambulation    Baseline  09/25/18 0.83 07/31/18 0.90m/s 07/20/18 same 06/10/18 0.1m/s    Time  8    Period  Weeks    Status  Achieved      Additional Long Term Goals   Additional Long Term Goals  Yes      PT LONG TERM GOAL #6   Title  Patient will demonstrate walk speed of 1.2 to demonstrate community ambulation norm    Baseline  09/25/18 0.74m/s    Time  8    Period  Weeks    Status  New      PT LONG TERM GOAL #7   Title  Patient will increase standing tolerance to 32min without increased pain in order to complete household chores/cooking    Baseline  09/25/18 Can stand for 40min with minimal increased pain, but reports it does continue to steadily worsen over 77mins    Time  8    Period  Weeks    Status  On-going              Patient will benefit from skilled therapeutic intervention in order to improve the following deficits and impairments:     Visit Diagnosis: Other idiopathic scoliosis, lumbar region  Radiculopathy, lumbar region     Problem List  Patient Active Problem List   Diagnosis Date Noted  . Central sleep apnea 10/09/2016  . Hypertension 10/09/2016  . Lumbar  postlaminectomy syndrome 10/09/2016  . Lumbar radiculopathy 10/09/2016  . Diabetic polyneuropathy ([place]) 10/07/2016  . Right lumbosacral radiculopathy 10/07/2016  . Healthcare maintenance 08/26/2016  . DKA (diabetic ketoacidoses) ([place]) 06/14/2016  . Atypical chest pain 02/23/2016  . SOB (shortness of breath) 02/23/2016  . Pain medication agreement signed 01/13/2015  . Acquired hypothyroidism 05/30/2014  . Frontal sinusitis 12/17/2013  . Atrial fibrillation ([place]) 11/29/2013  . Major depression in remission ([place]) 11/20/2013  . 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 Chronic pain syndrome 08/27/2013  . Back pain 07/17/2013  . HTN (hypertension), benign 07/17/2013  . Hyperlipidemia 07/17/2013  . Pancreatic insufficiency 07/17/2013  . Chronic postoperative pain 01/01/2013  . Long term current use of opiate analgesic 11/04/2012  . Hypothyroidism 08/25/2012  . Radiculopathy of leg 05/01/2011   Shelton Silvas PT, DPT Shelton Silvas 09/30/2018, 11:56 AM  [facility] [address] [place], [address] Phone: [phone]   Fax:  [phone]  Name: EVELETT CHIARA MRN: [id number] Date of Birth: 1950/08/21

## 2018-10-02 ENCOUNTER — Ambulatory Visit: Payer: Medicare Other | Admitting: Physical Therapy

## 2018-10-05 ENCOUNTER — Other Ambulatory Visit: Payer: Self-pay

## 2018-10-05 ENCOUNTER — Ambulatory Visit: Payer: Medicare Other | Admitting: Physical Therapy

## 2018-10-05 ENCOUNTER — Encounter: Payer: Self-pay | Admitting: Physical Therapy

## 2018-10-05 DIAGNOSIS — M4126 Other idiopathic scoliosis, lumbar region: Secondary | ICD-10-CM | POA: Diagnosis not present

## 2018-10-05 DIAGNOSIS — M5416 Radiculopathy, lumbar region: Secondary | ICD-10-CM

## 2018-10-05 NOTE — Therapy (Addendum)
[facility] [address] [place], [address] Phone: [phone]   Fax:  [phone]  Physical Therapy Treatment/ Progress Report  Reporting Period 09/25/18- 10/05/18  Patient Details  Name: Colleen Payne MRN: [id number] Date of Birth: January 21, 1951 Referring Provider (PT): Traci Sermon MD   Encounter Date: 10/05/2018  PT End of Session - 10/05/18 1045    Visit Number  30    Number of Visits  46    Date for PT Re-Evaluation  11/19/18    Authorization Type  Medicare    Authorization Time Period  8/10    PT Start Time  1040    PT Stop Time  1119    PT Time Calculation (min)  39 min    Activity Tolerance  Patient tolerated treatment well    Behavior During Therapy  [facility] for tasks assessed/performed       Past Medical History:  Diagnosis Date  . Anxiety   . Atrial fibrillation ([place])   . Depression   . Diabetes mellitus without complication (HCC)    Type I   . Headache    stress. 1x/month  . Hypertension   . Insulin pump in place   . Motion sickness    cars  . MVP (mitral valve prolapse)   . Neuromuscular disorder (HCC)    leg weakness s/p back surgeries  . Thyroid disease     Past Surgical History:  Procedure Laterality Date  . BACK SURGERY  2004   3 sugeries between Sept and Nov, fusion L2-S1  . CATARACT EXTRACTION W/PHACO Left 11/23/2014   Procedure: CATARACT EXTRACTION PHACO AND INTRAOCULAR LENS PLACEMENT (IOC);  Surgeon: Leandrew Koyanagi, MD;  Location: [place];  Service: Ophthalmology;  Laterality: Left;  DIABETIC - insulin pump    There were no vitals filed for this visit.  Subjective Assessment - 10/05/18 1042    Subjective  Patient reports she fel good, and accomplished following last session. Reports following not having therapy for a week she has increased pain to 8/10. Is still sleeping on her cot, with good success    Pertinent History  Patient is 68 yo female  that complains of lumbar pain and inability to walk erect, as well as hip/abdominal pain. Patient with history of lumbar fusion (L2-S1). Xray results show: Fairly stable appearance of the levoscoliosis of the lower thoracic and of the lumbar spine.Stable lateral subluxation toward the left of L2 with respect L3. Stable partial compression of the body of L1. Patient complains of frustration with physicians, and that her symptoms are worsening over time. 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 Reported that her pain and difficulty worsened after trying to pick her husband up about 1 year ago. Patient mentioned that she has been told that she has "spinal fluid pockets that are leaking". PMH also includes DM I with insulin pump,  afib, HTN,  HLD, chronic pain syndrome, SOB.    Limitations  Standing;Walking;House hold activities;Other (comment)    Diagnostic tests  see exray results: Fairly stable appearance of the levoscoliosis of the lower thoracic and lumbar spine. Stable lateral subluxation toward left of L2 with respect of L3, stable partial compression of the body of L1.    Patient Stated Goals  pain relief, standing erect    Pain Onset  More than a month ago       Ther-Ex -Supine bilat hip flexor stretch progressionin  modified thomas 29min - Ext off edge of mat table with hands behind head, PT stabilization at LEs 3x10 with cuing for full availible ext with good carry over following - Bird dog 3x 10 with min stabilization needed to prevent LOB, more with RUE/LLE, cuing to ext hip + knee with LE lift (hip ext difficult for pt), which is a progression for patient; rest in childs pose 30sec in between - Bridge x10 with patient able to complete exercise with much more motion than previous sessions - SL bridge 2x 8 with cuing to prevent lateral knee/hip instability with good carry over following - Rows in half kneeling 2 x10 each LE forward with GTB,  Stabilization at R knee with R half kneeling needing for safety, cuing for upright posture and scap retraction with good carry over, better balance in this position than previous session  - Backward walking with GTB at ankles for hip ext resistance, HHA posterior to patient 4x 52ft cuing for foot clearance  6/10 pain following                          PT Education - 10/05/18 1044    Education Details  therex form    Person(s) Educated  Patient    Methods  Explanation;Demonstration;Tactile cues;Verbal cues    Comprehension  Verbalized understanding;Returned demonstration;Verbal cues required;Tactile cues required       PT Short Term Goals - 07/27/18 0933      PT SHORT TERM GOAL #1   Title  Patient will be adherent to initial HEP at least 3x a week to improve functional strength and balance for better safety at home.    Time  4    Period  Weeks    Status  Achieved    Target Date  12/17/17        PT Long Term Goals - 09/25/18 1012      PT LONG TERM GOAL #1   Title  Patient will be independent in bending down towards floor and picking up small object (<5 pounds) and then stand back up without increased pain to improve ability to pick up and clean up room at home    Baseline  09/25/18/20 able to pick up object without increase in pain, though she reports continued baseline pain, goal revised and achieved    Time  8    Period  Weeks    Status  Achieved      PT LONG TERM GOAL #2   Title  Patient will increase BLE gross strength to 4+/5 as to improve functional strength for independent gait, increased standing tolerance and increased ADL ability.    Baseline  08/31/18 R/L flexion: 5/5 ER 5/5 IR 4+/4+ ABD 5/5 Ext 4+/4    Time  8    Period  Weeks    Status  Achieved      PT LONG TERM GOAL #3   Title  Patient will report a worst pain of 5/10 with transfers of her husband to improve tolerance with ADLs     Baseline  09/25/18 8/10 pain with transfers    Time  8     Period  Weeks    Status  Deferred      PT LONG TERM GOAL #4   Title  Pt will decrease 5TSTS to 10sec order to demonstrate clinically significant improvement in LE strength, and reduced fall risk    Baseline  09/25/18 11sec 08/31/18 12sec 07/31/18 13sec 07/20/18  15sec 06/10/18 16sec    Time  8    Period  Days    Status  Deferred      PT LONG TERM GOAL #5   Title  Pt will increase 10MWT by at least 0.13 m/s in order to demonstrate clinically significant improvement in community ambulation    Baseline  09/25/18 0.83 07/31/18 0.51m/s 07/20/18 same 06/10/18 0.75m/s    Time  8    Period  Weeks    Status  Achieved      Additional Long Term Goals   Additional Long Term Goals  Yes      PT LONG TERM GOAL #6   Title  Patient will demonstrate walk speed of 1.2 to demonstrate community ambulation norm    Baseline  09/25/18 0.71m/s    Time  8    Period  Weeks    Status  Deferred      PT LONG TERM GOAL #7   Title  Patient will increase standing tolerance to 77min without increased pain in order to complete household chores/cooking    Baseline  09/25/18 Can stand for 9min with minimal increased pain, but reports it does continue to steadily worsen over 70mins    Time  8    Period  Weeks    Status  Deferred            Plan - 10/05/18 1321    Clinical Impression Statement  PT continued therex progression, with half kneeling progression. Patient is able to complete all therex ith accuracy following some cuing and encouragement, with some tactile cuing/stabilization for safety. PT will continue therex progression for postural strengthening and motion with upright application as able.    Stability/Clinical Decision Making  Evolving/Moderate complexity    Clinical Decision Making  Moderate    Rehab Potential  Fair    Clinical Impairments Affecting Rehab Potential  comorbidities, symptom duration, curvature degree    PT Frequency  2x / week    PT Duration  8 weeks    PT Treatment/Interventions  ADLs/Self  Care Home Management;Aquatic Therapy;Cryotherapy;Ultrasound;Parrafin;Traction;Moist Heat;Electrical Stimulation;DME Instruction;Gait training;Stair training;Functional mobility training;Neuromuscular re-education;Balance training;Therapeutic exercise;Therapeutic activities;Patient/family education;Energy conservation;Spinal Manipulations;Joint Manipulations;Passive range of motion;Dry needling;Manual techniques    PT Next Visit Plan  Progress Hip and core strengthening as tolerated    PT Home Exercise Plan  Added standing pec stretch in doorway to HEP today    Consulted and Agree with Plan of Care  Patient       Patient will benefit from skilled therapeutic intervention in order to improve the following deficits and impairments:  Abnormal gait, Decreased balance, Decreased endurance, Decreased mobility, Difficulty walking, Hypomobility, Increased muscle spasms, Decreased range of motion, Pain, Postural dysfunction, Impaired flexibility, Increased fascial restricitons, Decreased strength, Decreased activity tolerance  Visit Diagnosis: Other idiopathic scoliosis, lumbar region  Radiculopathy, lumbar region     Problem List Patient Active Problem List   Diagnosis Date Noted  . Central sleep apnea 10/09/2016  . Hypertension 10/09/2016  . Lumbar postlaminectomy syndrome 10/09/2016  . Lumbar radiculopathy 10/09/2016  . Diabetic polyneuropathy ([place]) 10/07/2016  . Right lumbosacral radiculopathy 10/07/2016  . Healthcare maintenance 08/26/2016  . DKA (diabetic ketoacidoses) ([place]) 06/14/2016  . Atypical chest pain 02/23/2016  . SOB (shortness of breath) 02/23/2016  . Pain medication agreement signed 01/13/2015  . Acquired hypothyroidism 05/30/2014  . Frontal sinusitis 12/17/2013  . Atrial fibrillation ([place]) 11/29/2013  . Major depression in remission ([place]) 11/20/2013  . Type 1 diabetes mellitus with stage 2  chronic kidney disease ([place]) 10/26/2013  . Chronic pain syndrome 08/27/2013  . Back  pain 07/17/2013  . HTN (hypertension), benign 07/17/2013  . Hyperlipidemia 07/17/2013  . Pancreatic insufficiency 07/17/2013  . Chronic postoperative pain 01/01/2013  . Long term current use of opiate analgesic 11/04/2012  . Hypothyroidism 08/25/2012  . Radiculopathy of leg 05/01/2011   Shelton Silvas PT, DPT Shelton Silvas 10/05/2018, 1:26 PM  [facility] [address], [place], [address] Phone: [phone]   Fax:  [phone]  Name: Colleen Payne MRN: [id number] Date of Birth: December 25, 1950

## 2018-10-09 ENCOUNTER — Other Ambulatory Visit: Payer: Self-pay

## 2018-10-09 ENCOUNTER — Encounter: Payer: Self-pay | Admitting: Physical Therapy

## 2018-10-09 ENCOUNTER — Ambulatory Visit: Payer: Medicare Other | Admitting: Physical Therapy

## 2018-10-09 DIAGNOSIS — M5416 Radiculopathy, lumbar region: Secondary | ICD-10-CM

## 2018-10-09 DIAGNOSIS — M4126 Other idiopathic scoliosis, lumbar region: Secondary | ICD-10-CM | POA: Diagnosis not present

## 2018-10-09 NOTE — Therapy (Addendum)
[place] [facility] [facility] [address] [place], [address] Phone: [phone]   Fax:  [phone]  Physical Therapy Treatment  Patient Details  Name: Colleen Payne MRN: [id number] Date of Birth: 06/15/1950 Referring Provider (PT): Traci Sermon MD   Encounter Date: 10/09/2018  PT End of Session - 10/09/18 0952    Visit Number  31    Number of Visits  46    Date for PT Re-Evaluation  11/19/18    Authorization Type  Medicare    Authorization Time Period  9/10    PT Start Time  0940    PT Stop Time  1020    PT Time Calculation (min)  40 min    Activity Tolerance  Patient tolerated treatment well    Behavior During Therapy  [facility] for tasks assessed/performed       Past Medical History:  Diagnosis Date  . Anxiety   . Atrial fibrillation ([place])   . Depression   . Diabetes mellitus without complication (HCC)    Type I   . Headache    stress. 1x/month  . Hypertension   . Insulin pump in place   . Motion sickness    cars  . MVP (mitral valve prolapse)   . Neuromuscular disorder (HCC)    leg weakness s/p back surgeries  . Thyroid disease     Past Surgical History:  Procedure Laterality Date  . BACK SURGERY  2004   3 sugeries between Sept and Nov, fusion L2-S1  . CATARACT EXTRACTION W/PHACO Left 11/23/2014   Procedure: CATARACT EXTRACTION PHACO AND INTRAOCULAR LENS PLACEMENT (IOC);  Surgeon: Leandrew Koyanagi, MD;  Location: [place];  Service: Ophthalmology;  Laterality: Left;  DIABETIC - insulin pump    There were no vitals filed for this visit.  Subjective Assessment - 10/09/18 0949    Subjective  Is continuing to sleep laying down. Reports increased pain today d/t rain.    Pertinent History  Patient is 68 yo female that complains of lumbar pain and inability to walk erect, as well as hip/abdominal pain. Patient with history of lumbar fusion (L2-S1). Xray results show: Fairly stable  appearance of the levoscoliosis of the lower thoracic and of the lumbar spine.Stable lateral subluxation toward the left of L2 with respect L3. Stable partial compression of the body of L1. Patient complains of frustration with physicians, and that her symptoms are worsening over time. 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 Reported that her pain and difficulty worsened after trying to pick her husband up about 1 year ago. Patient mentioned that she has been told that she has "spinal fluid pockets that are leaking". PMH also includes DM I with insulin pump,  afib, HTN,  HLD, chronic pain syndrome, SOB.    Limitations  Standing;Walking;House hold activities;Other (comment)    Diagnostic tests  see exray results: Fairly stable appearance of the levoscoliosis of the lower thoracic and lumbar spine. Stable lateral subluxation toward left of L2 with respect of L3, stable partial compression of the body of L1.    Currently in Pain?  Yes    Pain Score  8         Ther-Ex -Supine bilat hip flexor stretch progressionin modified thomas49min - Prone head/chest lift with shoulder and elbow ext 2x 10 - Alt supermans 3x 8 with min cuing for full UE/LE lift wih good success following - Kneeling torso rotations  x10 PT cuing for posture - kneeling up-chops (knee to opposite shoulder)YTB 2x 6 each direction with PT cuing/stabilization at thoracic spine for ext - Overhead squat PVC pipie 2x 10 with good carry over from previous sessions  Discussion on progress thus far and encouragement to patient as she is somewhat down that she feels like was doing much better prior to COVID  6/10 pain following                         PT Education - 10/09/18 0951    Education Details  therex form    Person(s) Educated  Patient    Methods  Explanation;Demonstration;Verbal cues    Comprehension  Verbalized understanding;Returned demonstration;Verbal cues  required       PT Short Term Goals - 07/27/18 0933      PT SHORT TERM GOAL #1   Title  Patient will be adherent to initial HEP at least 3x a week to improve functional strength and balance for better safety at home.    Time  4    Period  Weeks    Status  Achieved    Target Date  12/17/17        PT Long Term Goals - 09/25/18 1012      PT LONG TERM GOAL #1   Title  Patient will be independent in bending down towards floor and picking up small object (<5 pounds) and then stand back up without increased pain to improve ability to pick up and clean up room at home    Baseline  09/25/18/20 able to pick up object without increase in pain, though she reports continued baseline pain, goal revised and achieved    Time  8    Period  Weeks    Status  Achieved      PT LONG TERM GOAL #2   Title  Patient will increase BLE gross strength to 4+/5 as to improve functional strength for independent gait, increased standing tolerance and increased ADL ability.    Baseline  08/31/18 R/L flexion: 5/5 ER 5/5 IR 4+/4+ ABD 5/5 Ext 4+/4    Time  8    Period  Weeks    Status  Achieved      PT LONG TERM GOAL #3   Title  Patient will report a worst pain of 5/10 with transfers of her husband to improve tolerance with ADLs     Baseline  09/25/18 8/10 pain with transfers    Time  8    Period  Weeks    Status  On-going      PT LONG TERM GOAL #4   Title  Pt will decrease 5TSTS to 10sec order to demonstrate clinically significant improvement in LE strength, and reduced fall risk    Baseline  09/25/18 11sec 08/31/18 12sec 07/31/18 13sec 07/20/18 15sec 06/10/18 16sec    Time  8    Period  Days    Status  On-going      PT LONG TERM GOAL #5   Title  Pt will increase 10MWT by at least 0.13 m/s in order to demonstrate clinically significant improvement in community ambulation    Baseline  09/25/18 0.83 07/31/18 0.42m/s 07/20/18 same 06/10/18 0.49m/s    Time  8    Period  Weeks    Status  Achieved      Additional Long  Term Goals   Additional Long Term Goals  Yes      PT LONG  TERM GOAL #6   Title  Patient will demonstrate walk speed of 1.2 to demonstrate community ambulation norm    Baseline  09/25/18 0.31m/s    Time  8    Period  Weeks    Status  New      PT LONG TERM GOAL #7   Title  Patient will increase standing tolerance to 57min without increased pain in order to complete household chores/cooking    Baseline  09/25/18 Can stand for 14min with minimal increased pain, but reports it does continue to steadily worsen over 61mins    Time  8    Period  Weeks    Status  On-going            Plan - 10/09/18 1049    Clinical Impression Statement  PT continued progression with prone and half kneeling positions, which patient is able to complete with success following some demo and cuing. Patient tearful at the end of session that she feels like she was better at standing up straight before COVID. PT encouraged patient on progress and that with being able to do more tasks comes more aches and pain and his is normal. Pt seems encouraged by the end of discussion. PT will continue progression as able.    Stability/Clinical Decision Making  Evolving/Moderate complexity    Clinical Decision Making  Moderate    Rehab Potential  Fair    Clinical Impairments Affecting Rehab Potential  comorbidities, symptom duration, curvature degree    PT Frequency  2x / week    PT Duration  8 weeks    PT Treatment/Interventions  ADLs/Self Care Home Management;Aquatic Therapy;Cryotherapy;Ultrasound;Parrafin;Traction;Moist Heat;Electrical Stimulation;DME Instruction;Gait training;Stair training;Functional mobility training;Neuromuscular re-education;Balance training;Therapeutic exercise;Therapeutic activities;Patient/family education;Energy conservation;Spinal Manipulations;Joint Manipulations;Passive range of motion;Dry needling;Manual techniques    PT Next Visit Plan  Progress Hip and core strengthening as tolerated    PT Home  Exercise Plan  Added standing pec stretch in doorway to HEP today    Consulted and Agree with Plan of Care  Patient       Patient will benefit from skilled therapeutic intervention in order to improve the following deficits and impairments:  Abnormal gait, Decreased balance, Decreased endurance, Decreased mobility, Difficulty walking, Hypomobility, Increased muscle spasms, Decreased range of motion, Pain, Postural dysfunction, Impaired flexibility, Increased fascial restricitons, Decreased strength, Decreased activity tolerance  Visit Diagnosis: Other idiopathic scoliosis, lumbar region  Radiculopathy, lumbar region     Problem List Patient Active Problem List   Diagnosis Date Noted  . Central sleep apnea 10/09/2016  . Hypertension 10/09/2016  . Lumbar postlaminectomy syndrome 10/09/2016  . Lumbar radiculopathy 10/09/2016  . Diabetic polyneuropathy ([place]) 10/07/2016  . Right lumbosacral radiculopathy 10/07/2016  . Healthcare maintenance 08/26/2016  . DKA (diabetic ketoacidoses) ([place]) 06/14/2016  . Atypical chest pain 02/23/2016  . SOB (shortness of breath) 02/23/2016  . Pain medication agreement signed 01/13/2015  . Acquired hypothyroidism 05/30/2014  . Frontal sinusitis 12/17/2013  . Atrial fibrillation ([place]) 11/29/2013  . Major depression in remission ([place]) 11/20/2013  . 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 Chronic pain syndrome 08/27/2013  . Back pain 07/17/2013  . HTN (hypertension), benign 07/17/2013  . Hyperlipidemia 07/17/2013  . Pancreatic insufficiency 07/17/2013  . Chronic postoperative pain 01/01/2013  . Long term current use of opiate analgesic 11/04/2012  . Hypothyroidism 08/25/2012  . Radiculopathy of leg 05/01/2011   Shelton Silvas PT, DPT Shelton Silvas 10/09/2018, 11:00 AM  [facility] [address] [place], [address] Phone: [phone]    Fax:  [phone]  Name: Colleen Payne MRN: [id number] Date of Birth: May 21, 1950

## 2018-10-12 ENCOUNTER — Encounter: Payer: Self-pay | Admitting: Physical Therapy

## 2018-10-12 ENCOUNTER — Ambulatory Visit: Payer: Medicare Other | Admitting: Physical Therapy

## 2018-10-12 ENCOUNTER — Other Ambulatory Visit: Payer: Self-pay

## 2018-10-12 DIAGNOSIS — M4126 Other idiopathic scoliosis, lumbar region: Secondary | ICD-10-CM

## 2018-10-12 DIAGNOSIS — M5416 Radiculopathy, lumbar region: Secondary | ICD-10-CM

## 2018-10-12 NOTE — Therapy (Signed)
[place] [facility] [address] [place], [address] Phone: [phone]   Fax:  [phone]  Physical Therapy Treatment  Patient Details  Name: Colleen Payne MRN: [id number] Date of Birth: 11-07-1950 Referring Provider (PT): Traci Sermon MD   Encounter Date: 10/12/2018  PT End of Session - 10/12/18 1041    Visit Number  32    Number of Visits  46    Date for PT Re-Evaluation  11/19/18    Authorization Time Period  10/10    PT Start Time  1035    PT Stop Time  1115    PT Time Calculation (min)  40 min    Activity Tolerance  Patient tolerated treatment well    Behavior During Therapy  [facility] for tasks assessed/performed       Past Medical History:  Diagnosis Date  . Anxiety   . Atrial fibrillation ([place])   . Depression   . Diabetes mellitus without complication (HCC)    Type I   . Headache    stress. 1x/month  . Hypertension   . Insulin pump in place   . Motion sickness    cars  . MVP (mitral valve prolapse)   . Neuromuscular disorder (HCC)    leg weakness s/p back surgeries  . Thyroid disease     Past Surgical History:  Procedure Laterality Date  . BACK SURGERY  2004   3 sugeries between Sept and Nov, fusion L2-S1  . CATARACT EXTRACTION W/PHACO Left 11/23/2014   Procedure: CATARACT EXTRACTION PHACO AND INTRAOCULAR LENS PLACEMENT (IOC);  Surgeon: Leandrew Koyanagi, MD;  Location: [place];  Service: Ophthalmology;  Laterality: Left;  DIABETIC - insulin pump    There were no vitals filed for this visit.  Subjective Assessment - 10/12/18 1040    Subjective  Continued pain with standing activity 8/10. Able to sleep laying down.    Pertinent History  Patient is 68 yo female that complains of lumbar pain and inability to walk erect, as well as hip/abdominal pain. Patient with history of lumbar fusion (L2-S1). Xray results show: Fairly stable appearance of the levoscoliosis of the lower  thoracic and of the lumbar spine.Stable lateral subluxation toward the left of L2 with respect L3. Stable partial compression of the body of L1. Patient complains of frustration with physicians, and that her symptoms are worsening over time. 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 Reported that her pain and difficulty worsened after trying to pick her husband up about 1 year ago. Patient mentioned that she has been told that she has "spinal fluid pockets that are leaking". PMH also includes DM I with insulin pump,  afib, HTN,  HLD, chronic pain syndrome, SOB.    Limitations  Standing;Walking;House hold activities;Other (comment)    Diagnostic tests  see exray results: Fairly stable appearance of the levoscoliosis of the lower thoracic and lumbar spine. Stable lateral subluxation toward left of L2 with respect of L3, stable partial compression of the body of L1.    Patient Stated Goals  pain relief, standing erect       Ther-Ex -Supine bilat hip flexor stretch progressionin modified thomas76min - Prone T's with torso off mat table (from rib cage up) for extensor strengthening as well as periscapular strengthening 3x 10 with PT stabilization at LEs, min cuing for scapular retraction maintaining neutral tspine/cspine with good carry over following - Birddog with full hip +  knee ext 3x 8 bilat with difficulty with RLE ext, min stabilization needed at hips to prevent LOB - kneeling up-chops (knee to opposite shoulder) RTB 3x 6 each direction with PT cuing/stabilization at thoracic spine for ext - Good mornings with PVC pipie overhead to encourage neutral spine with hip hinge 2x 10 with good carry over from previous sessions - 5xSTS x2 trials best time 10.1sec SLS x2 trials best time RLE 4sec LLE 6sec   6/10 pain following 10MWT 13.5sec                       PT Education - 10/12/18 1043    Education Details  goal update, therex form     Person(s) Educated  Patient    Methods  Explanation;Demonstration;Tactile cues;Verbal cues    Comprehension  Verbalized understanding;Returned demonstration;Verbal cues required;Tactile cues required       PT Short Term Goals - 07/27/18 0933      PT SHORT TERM GOAL #1   Title  Patient will be adherent to initial HEP at least 3x a week to improve functional strength and balance for better safety at home.    Time  4    Period  Weeks    Status  Achieved    Target Date  12/17/17        PT Long Term Goals - 10/12/18 1110      PT LONG TERM GOAL #1   Title  Patient will be independent in bending down towards floor and picking up small object (<5 pounds) and then stand back up without increased pain to improve ability to pick up and clean up room at home    Baseline  09/25/18/20 able to pick up object without increase in pain, though she reports continued baseline pain, goal revised and achieved    Time  8    Period  Weeks    Status  Achieved      PT LONG TERM GOAL #2   Title  Patient will increase BLE gross strength to 4+/5 as to improve functional strength for independent gait, increased standing tolerance and increased ADL ability.    Baseline  08/31/18 R/L flexion: 5/5 ER 5/5 IR 4+/4+ ABD 5/5 Ext 4+/4    Time  8    Period  Weeks    Status  Achieved      PT LONG TERM GOAL #3   Title  Patient will report a worst pain of 5/10 with transfers of her husband to improve tolerance with ADLs     Baseline  10/12/18 8/10 pain with transfers      PT LONG TERM GOAL #4   Title  Pt will decrease 5TSTS to 10sec order to demonstrate clinically significant improvement in LE strength, and reduced fall risk    Baseline  10/12/18 10.1sec 09/25/18 11sec 08/31/18 12sec 07/31/18 13sec 07/20/18 15sec 06/10/18 16sec    Time  8    Period  Days    Status  On-going      PT LONG TERM GOAL #5   Title  Pt will increase 10MWT by at least 0.13 m/s in order to demonstrate clinically significant improvement in  community ambulation    Baseline  09/25/18 0.83 07/31/18 0.11m/s 07/20/18 same 06/10/18 0.69m/s    Time  8    Period  Weeks    Status  Achieved      PT LONG TERM GOAL #6   Title  Patient will demonstrate walk speed of 1.2  to demonstrate community ambulation norm    Baseline  10/12/18 0.51m/s 09/25/18 0.23m/s    Time  8    Period  Weeks    Status  On-going      PT LONG TERM GOAL #7   Title  Patient will increase standing tolerance to 16min without increased pain in order to complete household chores/cooking    Baseline  09/25/18 Can stand for 87min with minimal increased pain, but reports it does continue to steadily worsen over 31mins    Time  8    Period  Weeks    Status  On-going      PT LONG TERM GOAL #8   Title  Patient will demonstrate SLS of 10sec to demonstrate decreased fall risk    Baseline  RLE 4sec LLE 6sec    Time  8    Period  Weeks    Status  New            Plan - 10/12/18 1226    Clinical Impression Statement  PT continued progression for extensor musculature with good carry over from previous sessions allowing for increased intensity of therex. PT reassessed patient goals this session for medicare compliance with pain remaining fairly unchanged, and strength continuing to increase. Patient remains a fall risk according to gait speed and inability for SLS weight bearing. PT is continuing to work on core and hip stabilization/strengthening to adddress this. PT will continue progression as able.    Stability/Clinical Decision Making  Evolving/Moderate complexity    Clinical Decision Making  Moderate    Rehab Potential  Fair    Clinical Impairments Affecting Rehab Potential  comorbidities, symptom duration, curvature degree    PT Frequency  2x / week    PT Duration  8 weeks    PT Treatment/Interventions  ADLs/Self Care Home Management;Aquatic Therapy;Cryotherapy;Ultrasound;Parrafin;Traction;Moist Heat;Electrical Stimulation;DME Instruction;Gait training;Stair  training;Functional mobility training;Neuromuscular re-education;Balance training;Therapeutic exercise;Therapeutic activities;Patient/family education;Energy conservation;Spinal Manipulations;Joint Manipulations;Passive range of motion;Dry needling;Manual techniques    PT Next Visit Plan  Progress Hip and core strengthening as tolerated    PT Home Exercise Plan  Added standing pec stretch in doorway to HEP today    Consulted and Agree with Plan of Care  Patient       Patient will benefit from skilled therapeutic intervention in order to improve the following deficits and impairments:  Abnormal gait, Decreased balance, Decreased endurance, Decreased mobility, Difficulty walking, Hypomobility, Increased muscle spasms, Decreased range of motion, Pain, Postural dysfunction, Impaired flexibility, Increased fascial restricitons, Decreased strength, Decreased activity tolerance  Visit Diagnosis: Other idiopathic scoliosis, lumbar region  Radiculopathy, lumbar region     Problem List Patient Active Problem List   Diagnosis Date Noted  . Central sleep apnea 10/09/2016  . Hypertension 10/09/2016  . Lumbar postlaminectomy syndrome 10/09/2016  . Lumbar radiculopathy 10/09/2016  . Diabetic polyneuropathy ([place]) 10/07/2016  . Right lumbosacral radiculopathy 10/07/2016  . Healthcare maintenance 08/26/2016  . DKA (diabetic ketoacidoses) ([place]) 06/14/2016  . Atypical chest pain 02/23/2016  . SOB (shortness of breath) 02/23/2016  . Pain medication agreement signed 01/13/2015  . Acquired hypothyroidism 05/30/2014  . Frontal sinusitis 12/17/2013  . Atrial fibrillation ([place]) 11/29/2013  . Major depression in remission ([place]) 11/20/2013  . 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 Chronic pain syndrome 08/27/2013  . Back pain 07/17/2013  . HTN (hypertension), benign 07/17/2013  . Hyperlipidemia 07/17/2013  . Pancreatic insufficiency 07/17/2013  . Chronic  postoperative pain 01/01/2013  .  Long term current use of opiate analgesic 11/04/2012  . Hypothyroidism 08/25/2012  . Radiculopathy of leg 05/01/2011   Shelton Silvas PT, DPT Shelton Silvas 10/12/2018, 12:28 PM  [place] [facility] [address] [place], [address] Phone: [phone]   Fax:  [phone]  Name: Colleen Payne MRN: [id number] Date of Birth: [birth year]

## 2018-10-14 ENCOUNTER — Other Ambulatory Visit: Payer: Self-pay

## 2018-10-14 ENCOUNTER — Encounter: Payer: Self-pay | Admitting: Physical Therapy

## 2018-10-14 ENCOUNTER — Ambulatory Visit: Payer: Medicare Other | Admitting: Physical Therapy

## 2018-10-14 DIAGNOSIS — M4126 Other idiopathic scoliosis, lumbar region: Secondary | ICD-10-CM

## 2018-10-14 DIAGNOSIS — M5416 Radiculopathy, lumbar region: Secondary | ICD-10-CM

## 2018-10-14 NOTE — Therapy (Signed)
[name] [address] [place], [address] Phone: [phone]   Fax:  [phone]  Physical Therapy Treatment  Patient Details  Name: Colleen Payne MRN: [id number] Date of Birth: 10-Jan-1951 Referring Provider (PT): Traci Sermon MD   Encounter Date: 10/14/2018  PT End of Session - 10/14/18 1145    Visit Number  33    Number of Visits  46    Date for PT Re-Evaluation  11/19/18    PT Start Time  1137    PT Stop Time  1200    PT Time Calculation (min)  23 min    Activity Tolerance  Patient tolerated treatment well    Behavior During Therapy  [facility] for tasks assessed/performed       Past Medical History:  Diagnosis Date  . Anxiety   . Atrial fibrillation ([place])   . Depression   . Diabetes mellitus without complication (HCC)    Type I   . Headache    stress. 1x/month  . Hypertension   . Insulin pump in place   . Motion sickness    cars  . MVP (mitral valve prolapse)   . Neuromuscular disorder (HCC)    leg weakness s/p back surgeries  . Thyroid disease     Past Surgical History:  Procedure Laterality Date  . BACK SURGERY  2004   3 sugeries between Sept and Nov, fusion L2-S1  . CATARACT EXTRACTION W/PHACO Left 11/23/2014   Procedure: CATARACT EXTRACTION PHACO AND INTRAOCULAR LENS PLACEMENT (IOC);  Surgeon: Leandrew Koyanagi, MD;  Location: [place];  Service: Ophthalmology;  Laterality: Left;  DIABETIC - insulin pump    There were no vitals filed for this visit.  Subjective Assessment - 10/14/18 1139    Subjective  Increased pain following increased walking between appointments today. Still sleeping laying down, getting decent sleep overall.    Pertinent History  Patient is 68 yo female that complains of lumbar pain and inability to walk erect, as well as hip/abdominal pain. Patient with history of lumbar fusion (L2-S1). Xray results show: Fairly stable appearance of the  levoscoliosis of the lower thoracic and of the lumbar spine.Stable lateral subluxation toward the left of L2 with respect L3. Stable partial compression of the body of L1. Patient complains of frustration with physicians, and that her symptoms are worsening over time. 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 Reported that her pain and difficulty worsened after trying to pick her husband up about 1 year ago. Patient mentioned that she has been told that she has "spinal fluid pockets that are leaking". PMH also includes DM I with insulin pump,  afib, HTN,  HLD, chronic pain syndrome, SOB.    Diagnostic tests  see exray results: Fairly stable appearance of the levoscoliosis of the lower thoracic and lumbar spine. Stable lateral subluxation toward left of L2 with respect of L3, stable partial compression of the body of L1.    Patient Stated Goals  pain relief, standing erect         Ther-Ex -Supine bilat hip flexor stretch progressionin modified thomas83min - Kneeling on airex pad palloff with rotation RTB 3x 8 each direction with TC needed for proper motion and posture with decent carry over following - Seated on theraball rows BluTB 3x 10 with min cuing for posture to "sit up tall" even during rest from therex Backward walking 2x 15ft with HHA post  to patient to encourage hip ext                      PT Education - 10/14/18 1141    Education Details  therex form    Person(s) Educated  Patient    Methods  Explanation;Demonstration;Tactile cues;Verbal cues    Comprehension  Verbalized understanding;Returned demonstration;Tactile cues required;Verbal cues required       PT Short Term Goals - 07/27/18 0933      PT SHORT TERM GOAL #1   Title  Patient will be adherent to initial HEP at least 3x a week to improve functional strength and balance for better safety at home.    Time  4    Period  Weeks    Status  Achieved    Target  Date  12/17/17        PT Long Term Goals - 10/12/18 1110      PT LONG TERM GOAL #1   Title  Patient will be independent in bending down towards floor and picking up small object (<5 pounds) and then stand back up without increased pain to improve ability to pick up and clean up room at home    Baseline  09/25/18/20 able to pick up object without increase in pain, though she reports continued baseline pain, goal revised and achieved    Time  8    Period  Weeks    Status  Achieved      PT LONG TERM GOAL #2   Title  Patient will increase BLE gross strength to 4+/5 as to improve functional strength for independent gait, increased standing tolerance and increased ADL ability.    Baseline  08/31/18 R/L flexion: 5/5 ER 5/5 IR 4+/4+ ABD 5/5 Ext 4+/4    Time  8    Period  Weeks    Status  Achieved      PT LONG TERM GOAL #3   Title  Patient will report a worst pain of 5/10 with transfers of her husband to improve tolerance with ADLs     Baseline  10/12/18 8/10 pain with transfers      PT LONG TERM GOAL #4   Title  Pt will decrease 5TSTS to 10sec order to demonstrate clinically significant improvement in LE strength, and reduced fall risk    Baseline  10/12/18 10.1sec 09/25/18 11sec 08/31/18 12sec 07/31/18 13sec 07/20/18 15sec 06/10/18 16sec    Time  8    Period  Days    Status  On-going      PT LONG TERM GOAL #5   Title  Pt will increase 10MWT by at least 0.13 m/s in order to demonstrate clinically significant improvement in community ambulation    Baseline  09/25/18 0.83 07/31/18 0.67m/s 07/20/18 same 06/10/18 0.39m/s    Time  8    Period  Weeks    Status  Achieved      PT LONG TERM GOAL #6   Title  Patient will demonstrate walk speed of 1.2 to demonstrate community ambulation norm    Baseline  10/12/18 0.10m/s 09/25/18 0.67m/s    Time  8    Period  Weeks    Status  On-going      PT LONG TERM GOAL #7   Title  Patient will increase standing tolerance to 41min without increased pain in order to  complete household chores/cooking    Baseline  09/25/18 Can stand for 27min with minimal increased pain, but reports it does continue  to steadily worsen over 14mins    Time  8    Period  Weeks    Status  On-going      PT LONG TERM GOAL #8   Title  Patient will demonstrate SLS of 10sec to demonstrate decreased fall risk    Baseline  RLE 4sec LLE 6sec    Time  8    Period  Weeks    Status  New            Plan - 10/14/18 1149    Clinical Impression Statement  Session shortened d/t patient being late d/t conflicting drs appt. PT continued therex progression for postural strengthening with good success. Patient requires verbal and TC for proper technique and posture with compound movements, but is able to activate correct muscle groups with good confedience. PT will continue progression to improve function as able.    Stability/Clinical Decision Making  Evolving/Moderate complexity    Clinical Decision Making  Moderate    Rehab Potential  Fair    Clinical Impairments Affecting Rehab Potential  comorbidities, symptom duration, curvature degree    PT Frequency  2x / week    PT Duration  8 weeks    PT Treatment/Interventions  ADLs/Self Care Home Management;Aquatic Therapy;Cryotherapy;Ultrasound;Parrafin;Traction;Moist Heat;Electrical Stimulation;DME Instruction;Gait training;Stair training;Functional mobility training;Neuromuscular re-education;Balance training;Therapeutic exercise;Therapeutic activities;Patient/family education;Energy conservation;Spinal Manipulations;Joint Manipulations;Passive range of motion;Dry needling;Manual techniques    PT Next Visit Plan  Progress Hip and core strengthening as tolerated    PT Home Exercise Plan  Added standing pec stretch in doorway to HEP today    Consulted and Agree with Plan of Care  Patient       Patient will benefit from skilled therapeutic intervention in order to improve the following deficits and impairments:  Abnormal gait, Decreased  balance, Decreased endurance, Decreased mobility, Difficulty walking, Hypomobility, Increased muscle spasms, Decreased range of motion, Pain, Postural dysfunction, Impaired flexibility, Increased fascial restricitons, Decreased strength, Decreased activity tolerance  Visit Diagnosis: Other idiopathic scoliosis, lumbar region  Radiculopathy, lumbar region     Problem List Patient Active Problem List   Diagnosis Date Noted  . Central sleep apnea 10/09/2016  . Hypertension 10/09/2016  . Lumbar postlaminectomy syndrome 10/09/2016  . Lumbar radiculopathy 10/09/2016  . Diabetic polyneuropathy ([place]) 10/07/2016  . Right lumbosacral radiculopathy 10/07/2016  . Healthcare maintenance 08/26/2016  . DKA (diabetic ketoacidoses) ([place]) 06/14/2016  . Atypical chest pain 02/23/2016  . SOB (shortness of breath) 02/23/2016  . Pain medication agreement signed 01/13/2015  . Acquired hypothyroidism 05/30/2014  . Frontal sinusitis 12/17/2013  . Atrial fibrillation ([place]) 11/29/2013  . Major depression in remission ([place]) 11/20/2013  . 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 Chronic pain syndrome 08/27/2013  . Back pain 07/17/2013  . HTN (hypertension), benign 07/17/2013  . Hyperlipidemia 07/17/2013  . Pancreatic insufficiency 07/17/2013  . Chronic postoperative pain 01/01/2013  . Long term current use of opiate analgesic 11/04/2012  . Hypothyroidism 08/25/2012  . Radiculopathy of leg 05/01/2011   Shelton Silvas PT, DPT Shelton Silvas 10/14/2018, 12:02 PM  [facility] [address] [place], [address] Phone: [phone]   Fax:  [phone]  Name: ASHI KRITZ MRN: [id number] Date of Birth: August 08, 1950

## 2018-10-19 ENCOUNTER — Ambulatory Visit: Payer: Medicare Other

## 2018-10-19 ENCOUNTER — Other Ambulatory Visit: Payer: Self-pay

## 2018-10-19 DIAGNOSIS — M4126 Other idiopathic scoliosis, lumbar region: Secondary | ICD-10-CM | POA: Diagnosis not present

## 2018-10-19 DIAGNOSIS — M5416 Radiculopathy, lumbar region: Secondary | ICD-10-CM

## 2018-10-19 NOTE — Therapy (Signed)
[facility] [address], [place], [address] Phone: [phone]   Fax:  [phone]  Physical Therapy Treatment  Patient Details  Name: Colleen Payne MRN: [id number] Date of Birth: 1950/02/23 Referring Provider (PT): Traci Sermon MD   Encounter Date: 10/19/2018  PT End of Session - 10/19/18 1038    Visit Number  34    Number of Visits  46    Date for PT Re-Evaluation  11/19/18    Authorization Type  Medicare    PT Start Time  1031    PT Stop Time  1111    PT Time Calculation (min)  40 min    Activity Tolerance  Patient tolerated treatment well;No increased pain    Behavior During Therapy  WFL for tasks assessed/performed       Past Medical History:  Diagnosis Date  . Anxiety   . Atrial fibrillation ([place])   . Depression   . Diabetes mellitus without complication (HCC)    Type I   . Headache    stress. 1x/month  . Hypertension   . Insulin pump in place   . Motion sickness    cars  . MVP (mitral valve prolapse)   . Neuromuscular disorder (HCC)    leg weakness s/p back surgeries  . Thyroid disease     Past Surgical History:  Procedure Laterality Date  . BACK SURGERY  2004   3 sugeries between Sept and Nov, fusion L2-S1  . CATARACT EXTRACTION W/PHACO Left 11/23/2014   Procedure: CATARACT EXTRACTION PHACO AND INTRAOCULAR LENS PLACEMENT (IOC);  Surgeon: Leandrew Koyanagi, MD;  Location: [facility];  Service: Ophthalmology;  Laterality: Left;  DIABETIC - insulin pump    There were no vitals filed for this visit.  Subjective Assessment - 10/19/18 1034    Subjective  Pt reports falling Friday night while getting up to toilet. She is unsure how long she was down, got up and proceded to check BG which was low, but she reports she didn't check it. She hurt her Left arm which is now bruised, and her head which is not bruised.    Pertinent History  Patient is 68 yo female that  complains of lumbar pain and inability to walk erect, as well as hip/abdominal pain. Patient with history of lumbar fusion (L2-S1). Xray results show: Fairly stable appearance of the levoscoliosis of the lower thoracic and of the lumbar spine.Stable lateral subluxation toward the left of L2 with respect L3. Stable partial compression of the body of L1. Patient complains of frustration with physicians, and that her symptoms are worsening over time. 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 Reported that her pain and difficulty worsened after trying to pick her husband up about 1 year ago. Patient mentioned that she has been told that she has "spinal fluid pockets that are leaking". PMH also includes DM I with insulin pump,  afib, HTN,  HLD, chronic pain syndrome, SOB.    Currently in Pain?  Yes    Pain Score  7    low back pain     INTERVENTION -Thomas Test modified Psoas stretch 3x30sec bilat -Hooklying Knee dropout groin stretch 3x60sec (guarded, difficulty relaxing)  -hooklying single knee marching (cued wide) 1x12 bilat  -glute max bridge 1x10, poor amplitude and Rt dominating over left -Birddog with full hip + knee ext 2x12 bilat, difficulty with RLE ext, min stabilization needed at  hips to prevent LOB -Anterior glide hip on pelvis in prone, 3x30sec Grade 4 bilat  -Standing RedTB scap horizontal abduction (T) locked elbows 2x10       PT Short Term Goals - 07/27/18 0933      PT SHORT TERM GOAL #1   Title  Patient will be adherent to initial HEP at least 3x a week to improve functional strength and balance for better safety at home.    Time  4    Period  Weeks    Status  Achieved    Target Date  12/17/17        PT Long Term Goals - 10/12/18 1110      PT LONG TERM GOAL #1   Title  Patient will be independent in bending down towards floor and picking up small object (<5 pounds) and then stand back up without increased pain to improve  ability to pick up and clean up room at home    Baseline  09/25/18/20 able to pick up object without increase in pain, though she reports continued baseline pain, goal revised and achieved    Time  8    Period  Weeks    Status  Achieved      PT LONG TERM GOAL #2   Title  Patient will increase BLE gross strength to 4+/5 as to improve functional strength for independent gait, increased standing tolerance and increased ADL ability.    Baseline  08/31/18 R/L flexion: 5/5 ER 5/5 IR 4+/4+ ABD 5/5 Ext 4+/4    Time  8    Period  Weeks    Status  Achieved      PT LONG TERM GOAL #3   Title  Patient will report a worst pain of 5/10 with transfers of her husband to improve tolerance with ADLs     Baseline  10/12/18 8/10 pain with transfers      PT LONG TERM GOAL #4   Title  Pt will decrease 5TSTS to 10sec order to demonstrate clinically significant improvement in LE strength, and reduced fall risk    Baseline  10/12/18 10.1sec 09/25/18 11sec 08/31/18 12sec 07/31/18 13sec 07/20/18 15sec 06/10/18 16sec    Time  8    Period  Days    Status  On-going      PT LONG TERM GOAL #5   Title  Pt will increase 10MWT by at least 0.13 m/s in order to demonstrate clinically significant improvement in community ambulation    Baseline  09/25/18 0.83 07/31/18 0.62m/s 07/20/18 same 06/10/18 0.58m/s    Time  8    Period  Weeks    Status  Achieved      PT LONG TERM GOAL #6   Title  Patient will demonstrate walk speed of 1.2 to demonstrate community ambulation norm    Baseline  10/12/18 0.58m/s 09/25/18 0.37m/s    Time  8    Period  Weeks    Status  On-going      PT LONG TERM GOAL #7   Title  Patient will increase standing tolerance to 48min without increased pain in order to complete household chores/cooking    Baseline  09/25/18 Can stand for 15min with minimal increased pain, but reports it does continue to steadily worsen over 89mins    Time  8    Period  Weeks    Status  On-going      PT LONG TERM GOAL #8   Title  Patient  will demonstrate SLS of  10sec to demonstrate decreased fall risk    Baseline  RLE 4sec LLE 6sec    Time  8    Period  Weeks    Status  New            Plan - 10/19/18 1041    Clinical Impression Statement  In general, patient demonstrating fair tolerance to therapy session this date. Pt arrives with low energy level, seems distracted, voices disappointment in her fall Friday night. Pt not very responsive to recommendation for additional medical workup regarding fall.  All interventional executed without exacerbation of pain or other symptoms. Hips remain very weak and limited in mobility as in prior sessions. Pt does not offer specifics in feedback regarding interventions this date, but largely perseverates on her sciatic issue unfamiliar to this author. No home exercise updates made at this time.    Rehab Potential  Fair    Clinical Impairments Affecting Rehab Potential  comorbidities, symptom duration, curvature degree    PT Frequency  2x / week    PT Duration  8 weeks    PT Treatment/Interventions  ADLs/Self Care Home Management;Aquatic Therapy;Cryotherapy;Ultrasound;Parrafin;Traction;Moist Heat;Electrical Stimulation;DME Instruction;Gait training;Stair training;Functional mobility training;Neuromuscular re-education;Balance training;Therapeutic exercise;Therapeutic activities;Patient/family education;Energy conservation;Spinal Manipulations;Joint Manipulations;Passive range of motion;Dry needling;Manual techniques    PT Next Visit Plan  Progress Hip and core strengthening as tolerated    PT Home Exercise Plan  Added standing pec stretch in doorway to HEP today    Consulted and Agree with Plan of Care  Patient       Patient will benefit from skilled therapeutic intervention in order to improve the following deficits and impairments:  Abnormal gait, Decreased balance, Decreased endurance, Decreased mobility, Difficulty walking, Hypomobility, Increased muscle spasms, Decreased range of  motion, Pain, Postural dysfunction, Impaired flexibility, Increased fascial restricitons, Decreased strength, Decreased activity tolerance  Visit Diagnosis: Other idiopathic scoliosis, lumbar region  Radiculopathy, lumbar region     Problem List Patient Active Problem List   Diagnosis Date Noted  . Central sleep apnea 10/09/2016  . Hypertension 10/09/2016  . Lumbar postlaminectomy syndrome 10/09/2016  . Lumbar radiculopathy 10/09/2016  . Diabetic polyneuropathy ([place]) 10/07/2016  . Right lumbosacral radiculopathy 10/07/2016  . Healthcare maintenance 08/26/2016  . DKA (diabetic ketoacidoses) ([place]) 06/14/2016  . Atypical chest pain 02/23/2016  . SOB (shortness of breath) 02/23/2016  . Pain medication agreement signed 01/13/2015  . Acquired hypothyroidism 05/30/2014  . Frontal sinusitis 12/17/2013  . Atrial fibrillation ([place]) 11/29/2013  . Major depression in remission ([place]) 11/20/2013  . 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 Chronic pain syndrome 08/27/2013  . Back pain 07/17/2013  . HTN (hypertension), benign 07/17/2013  . Hyperlipidemia 07/17/2013  . Pancreatic insufficiency 07/17/2013  . Chronic postoperative pain 01/01/2013  . Long term current use of opiate analgesic 11/04/2012  . Hypothyroidism 08/25/2012  . Radiculopathy of leg 05/01/2011    11:17 AM, 10/19/18 Etta Grandchild, PT, DPT Physical Therapist - [facility] [phone] (Office)   Buccola,Allan C 10/19/2018, 10:44 AM  [facility] [address] [place], [address] Phone: [phone]   Fax:  [phone]  Name: Colleen Payne MRN: [id number] Date of Birth: Jun 14, 1950

## 2018-10-23 ENCOUNTER — Ambulatory Visit: Payer: Medicare Other | Admitting: Physical Therapy

## 2018-10-26 ENCOUNTER — Other Ambulatory Visit: Payer: Self-pay

## 2018-10-26 ENCOUNTER — Ambulatory Visit: Payer: Medicare Other | Attending: Anesthesiology

## 2018-10-26 DIAGNOSIS — M5416 Radiculopathy, lumbar region: Secondary | ICD-10-CM | POA: Insufficient documentation

## 2018-10-26 DIAGNOSIS — M4126 Other idiopathic scoliosis, lumbar region: Secondary | ICD-10-CM | POA: Insufficient documentation

## 2018-10-26 NOTE — Therapy (Signed)
[facility] [address] [place], [address] Phone: [phone]   Fax:  [phone]  Physical Therapy Treatment  Patient Details  Name: Colleen Payne MRN: [id number] Date of Birth: Nov 16, 1950 Referring Provider (PT): Traci Sermon MD   Encounter Date: 10/26/2018  PT End of Session - 10/26/18 1006    Visit Number  35    Number of Visits  46    Date for PT Re-Evaluation  11/19/18    Authorization Type  Medicare    PT Start Time  0959    PT Stop Time  1030    PT Time Calculation (min)  31 min       Past Medical History:  Diagnosis Date  . Anxiety   . Atrial fibrillation ([place])   . Depression   . Diabetes mellitus without complication (HCC)    Type I   . Headache    stress. 1x/month  . Hypertension   . Insulin pump in place   . Motion sickness    cars  . MVP (mitral valve prolapse)   . Neuromuscular disorder (HCC)    leg weakness s/p back surgeries  . Thyroid disease     Past Surgical History:  Procedure Laterality Date  . BACK SURGERY  2004   3 sugeries between Sept and Nov, fusion L2-S1  . CATARACT EXTRACTION W/PHACO Left 11/23/2014   Procedure: CATARACT EXTRACTION PHACO AND INTRAOCULAR LENS PLACEMENT (IOC);  Surgeon: Leandrew Koyanagi, MD;  Location: [facility];  Service: Ophthalmology;  Laterality: Left;  DIABETIC - insulin pump    There were no vitals filed for this visit.  Subjective Assessment - 10/26/18 1001    Subjective  Pt reports she is doing ok today. She has not been able to get to her HEP due to caring for her husband. She has been having trouble with the scheduling of her husband's CNA.    Pertinent History  Patient is 68 yo female that complains of lumbar pain and inability to walk erect, as well as hip/abdominal pain. Patient with history of lumbar fusion (L2-S1). Xray results show: Fairly stable appearance of the levoscoliosis of the lower thoracic and of  the lumbar spine.Stable lateral subluxation toward the left of L2 with respect L3. Stable partial compression of the body of L1. Patient complains of frustration with physicians, and that her symptoms are worsening over time. 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 Reported that her pain and difficulty worsened after trying to pick her husband up about 1 year ago. Patient mentioned that she has been told that she has "spinal fluid pockets that are leaking". PMH also includes DM I with insulin pump,  afib, HTN,  HLD, chronic pain syndrome, SOB.    Currently in Pain?  No/denies   reports stiffness all over      INTERVENTION -Hooklying SKTC c contralateral hip flexion stretch 3x60sec bilat -glute max bridge 2x10 -hooklying cervical retraction into 2 pillows 10x3secH -hookling BUEwand flexion thoracic mobilization 1x15 -Hooklying SLR 2x10 bilat  -hooklying BUE shoulder extension isometric into table 1x10  -supine hip extension, straight leg, from 45-0 degrees with YTB resistance 2x10 bilat  -AMB x 3 minutes, SPC in RUE, sequencing is ineffective and unsafe; pt reports getting tired after 270ft, but able to complete 368ft; 0.44m/s    -STS from chair + airex, BUE extension  3x6, cues for up full available upright posture, fatigues quickly  after 6    PT Short Term Goals - 07/27/18 0933      PT SHORT TERM GOAL #1   Title  Patient will be adherent to initial HEP at least 3x a week to improve functional strength and balance for better safety at home.    Time  4    Period  Weeks    Status  Achieved    Target Date  12/17/17        PT Long Term Goals - 10/12/18 1110      PT LONG TERM GOAL #1   Title  Patient will be independent in bending down towards floor and picking up small object (<5 pounds) and then stand back up without increased pain to improve ability to pick up and clean up room at home    Baseline  09/25/18/20 able to pick up object without  increase in pain, though she reports continued baseline pain, goal revised and achieved    Time  8    Period  Weeks    Status  Achieved      PT LONG TERM GOAL #2   Title  Patient will increase BLE gross strength to 4+/5 as to improve functional strength for independent gait, increased standing tolerance and increased ADL ability.    Baseline  08/31/18 R/L flexion: 5/5 ER 5/5 IR 4+/4+ ABD 5/5 Ext 4+/4    Time  8    Period  Weeks    Status  Achieved      PT LONG TERM GOAL #3   Title  Patient will report a worst pain of 5/10 with transfers of her husband to improve tolerance with ADLs     Baseline  10/12/18 8/10 pain with transfers      PT LONG TERM GOAL #4   Title  Pt will decrease 5TSTS to 10sec order to demonstrate clinically significant improvement in LE strength, and reduced fall risk    Baseline  10/12/18 10.1sec 09/25/18 11sec 08/31/18 12sec 07/31/18 13sec 07/20/18 15sec 06/10/18 16sec    Time  8    Period  Days    Status  On-going      PT LONG TERM GOAL #5   Title  Pt will increase 10MWT by at least 0.13 m/s in order to demonstrate clinically significant improvement in community ambulation    Baseline  09/25/18 0.83 07/31/18 0.20m/s 07/20/18 same 06/10/18 0.47m/s    Time  8    Period  Weeks    Status  Achieved      PT LONG TERM GOAL #6   Title  Patient will demonstrate walk speed of 1.2 to demonstrate community ambulation norm    Baseline  10/12/18 0.62m/s 09/25/18 0.2m/s    Time  8    Period  Weeks    Status  On-going      PT LONG TERM GOAL #7   Title  Patient will increase standing tolerance to 35min without increased pain in order to complete household chores/cooking    Baseline  09/25/18 Can stand for 36min with minimal increased pain, but reports it does continue to steadily worsen over 48mins    Time  8    Period  Weeks    Status  On-going      PT LONG TERM GOAL #8   Title  Patient will demonstrate SLS of 10sec to demonstrate decreased fall risk    Baseline  RLE 4sec LLE 6sec     Time  8    Period  Weeks    Status  New            Plan - 10/26/18 1006    Clinical Impression Statement  In general, patient demonstrating fair tolerance to therapy session this date, reasonable accommodations are alllowed in-session to allow adequate rest between activities as needed. Pt arrives late due to caregiver complications, hnece session is truncated. Pt is participatory, but largley remains distracted and withdrawn thorugout. Pt given intermittent multimodal cues to teach best possible form with exercises. Pt does need multimodal cues to complete most exercises with correct form. Progress toward goals, particularly SLS and AMB speed goals remains limited. No home exercise updates made at this time.    Rehab Potential  Fair    Clinical Impairments Affecting Rehab Potential  comorbidities, symptom duration, curvature degree    PT Frequency  2x / week    PT Duration  8 weeks    PT Treatment/Interventions  ADLs/Self Care Home Management;Aquatic Therapy;Cryotherapy;Ultrasound;Parrafin;Traction;Moist Heat;Electrical Stimulation;DME Instruction;Gait training;Stair training;Functional mobility training;Neuromuscular re-education;Balance training;Therapeutic exercise;Therapeutic activities;Patient/family education;Energy conservation;Spinal Manipulations;Joint Manipulations;Passive range of motion;Dry needling;Manual techniques    PT Next Visit Plan  Progress Hip and core strengthening as tolerated    PT Home Exercise Plan  Added standing pec stretch in doorway to HEP today    Consulted and Agree with Plan of Care  Patient       Patient will benefit from skilled therapeutic intervention in order to improve the following deficits and impairments:  Abnormal gait, Decreased balance, Decreased endurance, Decreased mobility, Difficulty walking, Hypomobility, Increased muscle spasms, Decreased range of motion, Pain, Postural dysfunction, Impaired flexibility, Increased fascial restricitons,  Decreased strength, Decreased activity tolerance  Visit Diagnosis: Other idiopathic scoliosis, lumbar region  Radiculopathy, lumbar region     Problem List Patient Active Problem List   Diagnosis Date Noted  . Central sleep apnea 10/09/2016  . Hypertension 10/09/2016  . Lumbar postlaminectomy syndrome 10/09/2016  . Lumbar radiculopathy 10/09/2016  . Diabetic polyneuropathy ([place]) 10/07/2016  . Right lumbosacral radiculopathy 10/07/2016  . Healthcare maintenance 08/26/2016  . DKA (diabetic ketoacidoses) ([place]) 06/14/2016  . Atypical chest pain 02/23/2016  . SOB (shortness of breath) 02/23/2016  . Pain medication agreement signed 01/13/2015  . Acquired hypothyroidism 05/30/2014  . Frontal sinusitis 12/17/2013  . Atrial fibrillation ([place]) 11/29/2013  . Major depression in remission ([place]) 11/20/2013  . 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 Chronic pain syndrome 08/27/2013  . Back pain 07/17/2013  . HTN (hypertension), benign 07/17/2013  . Hyperlipidemia 07/17/2013  . Pancreatic insufficiency 07/17/2013  . Chronic postoperative pain 01/01/2013  . Long term current use of opiate analgesic 11/04/2012  . Hypothyroidism 08/25/2012  . Radiculopathy of leg 05/01/2011   10:40 AM, 10/26/18 Etta Grandchild, PT, DPT Physical Therapist - [place] [phone] (Office)    Buccola,Allan C 10/26/2018, 10:12 AM  [facility] [address], [place], [address] Phone: [phone]   Fax:  [phone]  Name: Colleen Payne MRN: [id number] Date of Birth: [birth year]

## 2018-10-30 ENCOUNTER — Encounter: Payer: Medicare Other | Admitting: Physical Therapy

## 2018-11-02 ENCOUNTER — Encounter: Payer: Self-pay | Admitting: Physical Therapy

## 2018-11-02 ENCOUNTER — Other Ambulatory Visit: Payer: Self-pay

## 2018-11-02 ENCOUNTER — Ambulatory Visit: Payer: Medicare Other | Admitting: Physical Therapy

## 2018-11-02 DIAGNOSIS — M4126 Other idiopathic scoliosis, lumbar region: Secondary | ICD-10-CM | POA: Diagnosis not present

## 2018-11-02 DIAGNOSIS — M5416 Radiculopathy, lumbar region: Secondary | ICD-10-CM

## 2018-11-02 NOTE — Therapy (Signed)
[facility] [address] [place], [address] Phone: [phone]   Fax:  [phone]  Physical Therapy Treatment  Patient Details  Name: Colleen Payne MRN: [id number] Date of Birth: 06-15-1950 Referring Provider (PT): Traci Sermon MD   Encounter Date: 11/02/2018  PT End of Session - 11/02/18 1028    Visit Number  36    Number of Visits  46    Date for PT Re-Evaluation  11/19/18    Authorization Type  Medicare    Authorization Time Period  3/10    PT Start Time  1000    PT Stop Time  1030    PT Time Calculation (min)  30 min    Activity Tolerance  Patient tolerated treatment well;No increased pain    Behavior During Therapy  WFL for tasks assessed/performed       Past Medical History:  Diagnosis Date  . Anxiety   . Atrial fibrillation ([place])   . Depression   . Diabetes mellitus without complication (HCC)    Type I   . Headache    stress. 1x/month  . Hypertension   . Insulin pump in place   . Motion sickness    cars  . MVP (mitral valve prolapse)   . Neuromuscular disorder (HCC)    leg weakness s/p back surgeries  . Thyroid disease     Past Surgical History:  Procedure Laterality Date  . BACK SURGERY  2004   3 sugeries between Sept and Nov, fusion L2-S1  . CATARACT EXTRACTION W/PHACO Left 11/23/2014   Procedure: CATARACT EXTRACTION PHACO AND INTRAOCULAR LENS PLACEMENT (IOC);  Surgeon: Leandrew Koyanagi, MD;  Location: [facility];  Service: Ophthalmology;  Laterality: Left;  DIABETIC - insulin pump    There were no vitals filed for this visit.  Subjective Assessment - 11/02/18 1002    Subjective  Patient reports she had a fall last week with brusing at bicep. Reports she is having a lot of pain at the posterior shoulder and is unable to put weight through her arm well without pain. Reports back pain is about the same. Fall d/t passing out because of low blood sugar.     Pertinent History  Patient is 68 yo female that complains of lumbar pain and inability to walk erect, as well as hip/abdominal pain. Patient with history of lumbar fusion (L2-S1). Xray results show: Fairly stable appearance of the levoscoliosis of the lower thoracic and of the lumbar spine.Stable lateral subluxation toward the left of L2 with respect L3. Stable partial compression of the body of L1. Patient complains of frustration with physicians, and that her symptoms are worsening over time. 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 Reported that her pain and difficulty worsened after trying to pick her husband up about 1 year ago. Patient mentioned that she has been told that she has "spinal fluid pockets that are leaking". PMH also includes DM I with insulin pump,  afib, HTN,  HLD, chronic pain syndrome, SOB.    Limitations  Standing;Walking;House hold activities;Other (comment)    Diagnostic tests  see exray results: Fairly stable appearance of the levoscoliosis of the lower thoracic and lumbar spine. Stable lateral subluxation toward left of L2 with respect of L3, stable partial compression of the body of L1.    Patient Stated Goals  pain relief, standing erect    Pain Onset  More than a month  ago       Ther-Ex - Bilat hip flex stretch supine off mat table x38min hold - Prone alt supermans 2x 10 each with difficulty with LUE lift d/t pain from fall - Bilat horizontal abd GTB in half kneeling 2x 10 (10 in R half kneeling, 10 in L) with TC and occasional min support at pelvisfor max erect posture - Squat with overhead pipe to encourage erect posture 2x 8 with mod TC to prevent R knee valgus, improvement with lowering phase, maintained difficulty with standing phase; 80% in second set with some continued valgus in final sets with fatigue - OMEGA leg press 10# x10; 15# x10; 20# x10                         PT Education - 11/02/18 1027     Education Details  therex form; length tension relationships    Person(s) Educated  Patient    Methods  Explanation;Demonstration;Verbal cues;Tactile cues    Comprehension  Verbalized understanding;Returned demonstration;Verbal cues required;Tactile cues required       PT Short Term Goals - 07/27/18 0933      PT SHORT TERM GOAL #1   Title  Patient will be adherent to initial HEP at least 3x a week to improve functional strength and balance for better safety at home.    Time  4    Period  Weeks    Status  Achieved    Target Date  12/17/17        PT Long Term Goals - 10/12/18 1110      PT LONG TERM GOAL #1   Title  Patient will be independent in bending down towards floor and picking up small object (<5 pounds) and then stand back up without increased pain to improve ability to pick up and clean up room at home    Baseline  09/25/18/20 able to pick up object without increase in pain, though she reports continued baseline pain, goal revised and achieved    Time  8    Period  Weeks    Status  Achieved      PT LONG TERM GOAL #2   Title  Patient will increase BLE gross strength to 4+/5 as to improve functional strength for independent gait, increased standing tolerance and increased ADL ability.    Baseline  08/31/18 R/L flexion: 5/5 ER 5/5 IR 4+/4+ ABD 5/5 Ext 4+/4    Time  8    Period  Weeks    Status  Achieved      PT LONG TERM GOAL #3   Title  Patient will report a worst pain of 5/10 with transfers of her husband to improve tolerance with ADLs     Baseline  10/12/18 8/10 pain with transfers      PT LONG TERM GOAL #4   Title  Pt will decrease 5TSTS to 10sec order to demonstrate clinically significant improvement in LE strength, and reduced fall risk    Baseline  10/12/18 10.1sec 09/25/18 11sec 08/31/18 12sec 07/31/18 13sec 07/20/18 15sec 06/10/18 16sec    Time  8    Period  Days    Status  On-going      PT LONG TERM GOAL #5   Title  Pt will increase 10MWT by at least 0.13 m/s in  order to demonstrate clinically significant improvement in community ambulation    Baseline  09/25/18 0.83 07/31/18 0.66m/s 07/20/18 same 06/10/18 0.70m/s    Time  8    Period  Weeks    Status  Achieved      PT LONG TERM GOAL #6   Title  Patient will demonstrate walk speed of 1.2 to demonstrate community ambulation norm    Baseline  10/12/18 0.30m/s 09/25/18 0.29m/s    Time  8    Period  Weeks    Status  On-going      PT LONG TERM GOAL #7   Title  Patient will increase standing tolerance to 53min without increased pain in order to complete household chores/cooking    Baseline  09/25/18 Can stand for 44min with minimal increased pain, but reports it does continue to steadily worsen over 32mins    Time  8    Period  Weeks    Status  On-going      PT LONG TERM GOAL #8   Title  Patient will demonstrate SLS of 10sec to demonstrate decreased fall risk    Baseline  RLE 4sec LLE 6sec    Time  8    Period  Weeks    Status  New            Plan - 11/02/18 1053    Clinical Impression Statement  Session shortened d/  pt arriving late. PT led patient through continued therex progression for posture, hip and core strength and balance with good success. PT steered away of therex with heavy impact through LUE until patient sees MD. Patient is motivated throughout session and is able to complete therex well with cuing. Will continue progression as able.    Stability/Clinical Decision Making  Evolving/Moderate complexity    Clinical Decision Making  Moderate    Rehab Potential  Fair    Clinical Impairments Affecting Rehab Potential  comorbidities, symptom duration, curvature degree    PT Frequency  2x / week    PT Duration  8 weeks    PT Treatment/Interventions  ADLs/Self Care Home Management;Aquatic Therapy;Cryotherapy;Ultrasound;Parrafin;Traction;Moist Heat;Electrical Stimulation;DME Instruction;Gait training;Stair training;Functional mobility training;Neuromuscular re-education;Balance  training;Therapeutic exercise;Therapeutic activities;Patient/family education;Energy conservation;Spinal Manipulations;Joint Manipulations;Passive range of motion;Dry needling;Manual techniques    PT Next Visit Plan  Progress Hip and core strengthening as tolerated    PT Home Exercise Plan  Added standing pec stretch in doorway to HEP today    Consulted and Agree with Plan of Care  Patient       Patient will benefit from skilled therapeutic intervention in order to improve the following deficits and impairments:  Abnormal gait, Decreased balance, Decreased endurance, Decreased mobility, Difficulty walking, Hypomobility, Increased muscle spasms, Decreased range of motion, Pain, Postural dysfunction, Impaired flexibility, Increased fascial restricitons, Decreased strength, Decreased activity tolerance  Visit Diagnosis: Other idiopathic scoliosis, lumbar region  Radiculopathy, lumbar region     Problem List Patient Active Problem List   Diagnosis Date Noted  . Central sleep apnea 10/09/2016  . Hypertension 10/09/2016  . Lumbar postlaminectomy syndrome 10/09/2016  . Lumbar radiculopathy 10/09/2016  . Diabetic polyneuropathy ([place]) 10/07/2016  . Right lumbosacral radiculopathy 10/07/2016  . Healthcare maintenance 08/26/2016  . DKA (diabetic ketoacidoses) ([place]) 06/14/2016  . Atypical chest pain 02/23/2016  . SOB (shortness of breath) 02/23/2016  . Pain medication agreement signed 01/13/2015  . Acquired hypothyroidism 05/30/2014  . Frontal sinusitis 12/17/2013  . Atrial fibrillation ([place]) 11/29/2013  . Major depression in remission ([place]) 11/20/2013  . 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 Chronic pain syndrome 08/27/2013  . Back pain 07/17/2013  . HTN (hypertension),  benign 07/17/2013  . Hyperlipidemia 07/17/2013  . Pancreatic insufficiency 07/17/2013  . Chronic postoperative pain 01/01/2013  . Long term current use of opiate analgesic  11/04/2012  . Hypothyroidism 08/25/2012  . Radiculopathy of leg 05/01/2011   Shelton Silvas PT, DPT Shelton Silvas 11/02/2018, 11:00 AM  [facility] [address], [place], [address] Phone: [phone]   Fax:  [phone]  Name: Colleen Payne MRN: [id number] Date of Birth: [birth year]

## 2018-11-06 ENCOUNTER — Ambulatory Visit: Payer: Medicare Other | Admitting: Physical Therapy

## 2018-11-09 ENCOUNTER — Encounter: Payer: Self-pay | Admitting: Physical Therapy

## 2018-11-09 ENCOUNTER — Ambulatory Visit: Payer: Medicare Other | Admitting: Physical Therapy

## 2018-11-09 ENCOUNTER — Other Ambulatory Visit: Payer: Self-pay

## 2018-11-09 DIAGNOSIS — M4126 Other idiopathic scoliosis, lumbar region: Secondary | ICD-10-CM | POA: Diagnosis not present

## 2018-11-09 DIAGNOSIS — M5416 Radiculopathy, lumbar region: Secondary | ICD-10-CM

## 2018-11-09 NOTE — Therapy (Signed)
[facility] [address], [place], [address] Phone: [phone]   Fax:  [phone]  Physical Therapy Treatment  Patient Details  Name: Colleen Payne MRN: [id number] Date of Birth: 04-27-1950 Referring Provider (PT): Traci Sermon MD   Encounter Date: 11/09/2018  PT End of Session - 11/09/18 1134    Visit Number  37    Number of Visits  46    Date for PT Re-Evaluation  11/19/18    Authorization Time Period  4/10    PT Start Time  0948    PT Stop Time  [id number]    PT Time Calculation (min)  47 min    Activity Tolerance  Patient tolerated treatment well;No increased pain    Behavior During Therapy  WFL for tasks assessed/performed       Past Medical History:  Diagnosis Date  . Anxiety   . Atrial fibrillation ([place])   . Depression   . Diabetes mellitus without complication (HCC)    Type I   . Headache    stress. 1x/month  . Hypertension   . Insulin pump in place   . Motion sickness    cars  . MVP (mitral valve prolapse)   . Neuromuscular disorder (HCC)    leg weakness s/p back surgeries  . Thyroid disease     Past Surgical History:  Procedure Laterality Date  . BACK SURGERY  2004   3 sugeries between Sept and Nov, fusion L2-S1  . CATARACT EXTRACTION W/PHACO Left 11/23/2014   Procedure: CATARACT EXTRACTION PHACO AND INTRAOCULAR LENS PLACEMENT (IOC);  Surgeon: Leandrew Koyanagi, MD;  Location: [facility];  Service: Ophthalmology;  Laterality: Left;  DIABETIC - insulin pump    There were no vitals filed for this visit.  Subjective Assessment - 11/09/18 1131    Subjective  Slight pain in R ribs d/t quick stretch pusing her bed closed. Doing well overall.    Pertinent History  Patient is 68 yo female that complains of lumbar pain and inability to walk erect, as well as hip/abdominal pain. Patient with history of lumbar fusion (L2-S1). Xray results show: Fairly stable appearance of  the levoscoliosis of the lower thoracic and of the lumbar spine.Stable lateral subluxation toward the left of L2 with respect L3. Stable partial compression of the body of L1. Patient complains of frustration with physicians, and that her symptoms are worsening over time. 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 Reported that her pain and difficulty worsened after trying to pick her husband up about 1 year ago. Patient mentioned that she has been told that she has "spinal fluid pockets that are leaking". PMH also includes DM I with insulin pump,  afib, HTN,  HLD, chronic pain syndrome, SOB.    Limitations  Standing;Walking;House hold activities;Other (comment)    Diagnostic tests  see exray results: Fairly stable appearance of the levoscoliosis of the lower thoracic and lumbar spine. Stable lateral subluxation toward left of L2 with respect of L3, stable partial compression of the body of L1.    Patient Stated Goals  pain relief, standing erect    Pain Onset  More than a month ago       Ther-Ex - Bilat hip flex stretch supine off mat table x64min with concurrent bilat shoulder flex with yellow theraball  - thoracic ext off edge of mat table 3x 8 with good carry over  posture -  Squat with overhead pipe to encourage erect posture 2x 12 with min TC  - Good morning 2x 12 with pipe overhead to encourage erect posture with good carry over - Hip flexion on theraball with non moving LE stabilized and additional supervision/CGA; rest seated on ball with supervision and min cuing for erect posture                         PT Education - 11/09/18 1132    Education Details  therex form    Person(s) Educated  Patient    Methods  Explanation;Demonstration;Verbal cues    Comprehension  Verbalized understanding;Returned demonstration;Verbal cues required       PT Short Term Goals - 07/27/18 0933      PT SHORT TERM GOAL #1   Title  Patient  will be adherent to initial HEP at least 3x a week to improve functional strength and balance for better safety at home.    Time  4    Period  Weeks    Status  Achieved    Target Date  12/17/17        PT Long Term Goals - 10/12/18 1110      PT LONG TERM GOAL #1   Title  Patient will be independent in bending down towards floor and picking up small object (<5 pounds) and then stand back up without increased pain to improve ability to pick up and clean up room at home    Baseline  09/25/18/20 able to pick up object without increase in pain, though she reports continued baseline pain, goal revised and achieved    Time  8    Period  Weeks    Status  Achieved      PT LONG TERM GOAL #2   Title  Patient will increase BLE gross strength to 4+/5 as to improve functional strength for independent gait, increased standing tolerance and increased ADL ability.    Baseline  08/31/18 R/L flexion: 5/5 ER 5/5 IR 4+/4+ ABD 5/5 Ext 4+/4    Time  8    Period  Weeks    Status  Achieved      PT LONG TERM GOAL #3   Title  Patient will report a worst pain of 5/10 with transfers of her husband to improve tolerance with ADLs     Baseline  10/12/18 8/10 pain with transfers      PT LONG TERM GOAL #4   Title  Pt will decrease 5TSTS to 10sec order to demonstrate clinically significant improvement in LE strength, and reduced fall risk    Baseline  10/12/18 10.1sec 09/25/18 11sec 08/31/18 12sec 07/31/18 13sec 07/20/18 15sec 06/10/18 16sec    Time  8    Period  Days    Status  On-going      PT LONG TERM GOAL #5   Title  Pt will increase 10MWT by at least 0.13 m/s in order to demonstrate clinically significant improvement in community ambulation    Baseline  09/25/18 0.83 07/31/18 0.49m/s 07/20/18 same 06/10/18 0.23m/s    Time  8    Period  Weeks    Status  Achieved      PT LONG TERM GOAL #6   Title  Patient will demonstrate walk speed of 1.2 to demonstrate community ambulation norm    Baseline  10/12/18 0.74m/s 09/25/18  0.5m/s    Time  8    Period  Weeks    Status  On-going  PT LONG TERM GOAL #7   Title  Patient will increase standing tolerance to 8min without increased pain in order to complete household chores/cooking    Baseline  09/25/18 Can stand for 51min with minimal increased pain, but reports it does continue to steadily worsen over 71mins    Time  8    Period  Weeks    Status  On-going      PT LONG TERM GOAL #8   Title  Patient will demonstrate SLS of 10sec to demonstrate decreased fall risk    Baseline  RLE 4sec LLE 6sec    Time  8    Period  Weeks    Status  New            Plan - 11/09/18 1242    Clinical Impression Statement  PT continued therex progression for posutral strength and endurance with good success. Patinet is able to complete progression without increased pain, and with good accuracy following cuing. Patient is improving strength and proprioception of RLE, as demonstrated by equal weight bearing in squatting. PT will continue progression as able.    Stability/Clinical Decision Making  Evolving/Moderate complexity    Clinical Decision Making  Moderate    Rehab Potential  Fair    Clinical Impairments Affecting Rehab Potential  comorbidities, symptom duration, curvature degree    PT Duration  8 weeks    PT Treatment/Interventions  ADLs/Self Care Home Management;Aquatic Therapy;Cryotherapy;Ultrasound;Parrafin;Traction;Moist Heat;Electrical Stimulation;DME Instruction;Gait training;Stair training;Functional mobility training;Neuromuscular re-education;Balance training;Therapeutic exercise;Therapeutic activities;Patient/family education;Energy conservation;Spinal Manipulations;Joint Manipulations;Passive range of motion;Dry needling;Manual techniques    PT Next Visit Plan  Progress Hip and core strengthening as tolerated    PT Home Exercise Plan  Added standing pec stretch in doorway to HEP today    Consulted and Agree with Plan of Care  Patient       Patient will  benefit from skilled therapeutic intervention in order to improve the following deficits and impairments:  Abnormal gait, Decreased balance, Decreased endurance, Decreased mobility, Difficulty walking, Hypomobility, Increased muscle spasms, Decreased range of motion, Pain, Postural dysfunction, Impaired flexibility, Increased fascial restricitons, Decreased strength, Decreased activity tolerance  Visit Diagnosis: Other idiopathic scoliosis, lumbar region  Radiculopathy, lumbar region     Problem List Patient Active Problem List   Diagnosis Date Noted  . Central sleep apnea 10/09/2016  . Hypertension 10/09/2016  . Lumbar postlaminectomy syndrome 10/09/2016  . Lumbar radiculopathy 10/09/2016  . Diabetic polyneuropathy ([place]) 10/07/2016  . Right lumbosacral radiculopathy 10/07/2016  . Healthcare maintenance 08/26/2016  . DKA (diabetic ketoacidoses) ([place]) 06/14/2016  . Atypical chest pain 02/23/2016  . SOB (shortness of breath) 02/23/2016  . Pain medication agreement signed 01/13/2015  . Acquired hypothyroidism 05/30/2014  . Frontal sinusitis 12/17/2013  . Atrial fibrillation ([place]) 11/29/2013  . Major depression in remission ([place]) 11/20/2013  . 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 Chronic pain syndrome 08/27/2013  . Back pain 07/17/2013  . HTN (hypertension), benign 07/17/2013  . Hyperlipidemia 07/17/2013  . Pancreatic insufficiency 07/17/2013  . Chronic postoperative pain 01/01/2013  . Long term current use of opiate analgesic 11/04/2012  . Hypothyroidism 08/25/2012  . Radiculopathy of leg 05/01/2011   Shelton Silvas PT, DPT Shelton Silvas 11/09/2018, 12:51 PM  [facility] [address] [place], [address] Phone: [phone]   Fax:  [phone]  Name: Colleen Payne MRN: [id number] Date of Birth: [birth year]

## 2018-11-13 ENCOUNTER — Ambulatory Visit: Payer: Medicare Other

## 2018-11-13 ENCOUNTER — Other Ambulatory Visit: Payer: Self-pay

## 2018-11-13 DIAGNOSIS — M5416 Radiculopathy, lumbar region: Secondary | ICD-10-CM

## 2018-11-13 DIAGNOSIS — M4126 Other idiopathic scoliosis, lumbar region: Secondary | ICD-10-CM | POA: Diagnosis not present

## 2018-11-13 NOTE — Therapy (Signed)
[facility] [address], [place], [address] Phone: [phone]   Fax:  [phone]  Physical Therapy Treatment  Patient Details  Name: Colleen Payne MRN: [id number] Date of Birth: July 21, 1950 Referring Provider (PT): Traci Sermon MD   Encounter Date: 11/13/2018  PT End of Session - 11/13/18 1003    Visit Number  38    Number of Visits  46    Date for PT Re-Evaluation  11/19/18    Authorization Type  Medicare    Authorization Time Period  5/10    PT Stop Time  1040    Activity Tolerance  Patient tolerated treatment well;No increased pain;Patient limited by fatigue;Patient limited by pain    Behavior During Therapy  [facility] for tasks assessed/performed;Flat affect       Past Medical History:  Diagnosis Date  . Anxiety   . Atrial fibrillation ([place])   . Depression   . Diabetes mellitus without complication (HCC)    Type I   . Headache    stress. 1x/month  . Hypertension   . Insulin pump in place   . Motion sickness    cars  . MVP (mitral valve prolapse)   . Neuromuscular disorder (HCC)    leg weakness s/p back surgeries  . Thyroid disease     Past Surgical History:  Procedure Laterality Date  . BACK SURGERY  2004   3 sugeries between Sept and Nov, fusion L2-S1  . CATARACT EXTRACTION W/PHACO Left 11/23/2014   Procedure: CATARACT EXTRACTION PHACO AND INTRAOCULAR LENS PLACEMENT (IOC);  Surgeon: Leandrew Koyanagi, MD;  Location: [facility];  Service: Ophthalmology;  Laterality: Left;  DIABETIC - insulin pump    There were no vitals filed for this visit.  Subjective Assessment - 11/13/18 1002    Subjective  Pt doing alright today. She is frustrated that her husband's CNA arrived late making her late to PT this date. Pt continues to struggle with finding time to do HEP items around caregiver responsibilities.    Pertinent History  Patient is 68 yo female that complains of lumbar  pain and inability to walk erect, as well as hip/abdominal pain. Patient with history of lumbar fusion (L2-S1). Xray results show: Fairly stable appearance of the levoscoliosis of the lower thoracic and of the lumbar spine.Stable lateral subluxation toward the left of L2 with respect L3. Stable partial compression of the body of L1. Patient complains of frustration with physicians, and that her symptoms are worsening over time. 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 Reported that her pain and difficulty worsened after trying to pick her husband up about 1 year ago. Patient mentioned that she has been told that she has "spinal fluid pockets that are leaking". PMH also includes DM I with insulin pump,  afib, HTN,  HLD, chronic pain syndrome, SOB.    Currently in Pain?  Yes    Pain Score  7     Pain Location  --   Left lower back       INTERVENTION THIS DATE:  -AMB intervals 3x284ft  (last 25ft had to be broken down into 125ft halves) (carrying 2lb dumbbells for some of these laps) -STS from slight elevated plinth 2x10  -standing hip extension 1x15 bilat -standing hip/knee fleixon 1x10 bilat -standing B hip ABD 1x10 bilat, 2lb AW  -steaded hip/flexion 1x10bilat, 2lb AW       PT Short Term  Goals - 07/27/18 0933      PT SHORT TERM GOAL #1   Title  Patient will be adherent to initial HEP at least 3x a week to improve functional strength and balance for better safety at home.    Time  4    Period  Weeks    Status  Achieved    Target Date  12/17/17        PT Long Term Goals - 10/12/18 1110      PT LONG TERM GOAL #1   Title  Patient will be independent in bending down towards floor and picking up small object (<5 pounds) and then stand back up without increased pain to improve ability to pick up and clean up room at home    Baseline  09/25/18/20 able to pick up object without increase in pain, though she reports continued baseline pain, goal  revised and achieved    Time  8    Period  Weeks    Status  Achieved      PT LONG TERM GOAL #2   Title  Patient will increase BLE gross strength to 4+/5 as to improve functional strength for independent gait, increased standing tolerance and increased ADL ability.    Baseline  08/31/18 R/L flexion: 5/5 ER 5/5 IR 4+/4+ ABD 5/5 Ext 4+/4    Time  8    Period  Weeks    Status  Achieved      PT LONG TERM GOAL #3   Title  Patient will report a worst pain of 5/10 with transfers of her husband to improve tolerance with ADLs     Baseline  10/12/18 8/10 pain with transfers      PT LONG TERM GOAL #4   Title  Pt will decrease 5TSTS to 10sec order to demonstrate clinically significant improvement in LE strength, and reduced fall risk    Baseline  10/12/18 10.1sec 09/25/18 11sec 08/31/18 12sec 07/31/18 13sec 07/20/18 15sec 06/10/18 16sec    Time  8    Period  Days    Status  On-going      PT LONG TERM GOAL #5   Title  Pt will increase 10MWT by at least 0.13 m/s in order to demonstrate clinically significant improvement in community ambulation    Baseline  09/25/18 0.83 07/31/18 0.30m/s 07/20/18 same 06/10/18 0.71m/s    Time  8    Period  Weeks    Status  Achieved      PT LONG TERM GOAL #6   Title  Patient will demonstrate walk speed of 1.2 to demonstrate community ambulation norm    Baseline  10/12/18 0.73m/s 09/25/18 0.11m/s    Time  8    Period  Weeks    Status  On-going      PT LONG TERM GOAL #7   Title  Patient will increase standing tolerance to 51min without increased pain in order to complete household chores/cooking    Baseline  09/25/18 Can stand for 90min with minimal increased pain, but reports it does continue to steadily worsen over 34mins    Time  8    Period  Weeks    Status  On-going      PT LONG TERM GOAL #8   Title  Patient will demonstrate SLS of 10sec to demonstrate decreased fall risk    Baseline  RLE 4sec LLE 6sec    Time  8    Period  Weeks    Status  New  Plan - 11/13/18 1004    Clinical Impression Statement  Pt able to complete entire session as planned with rest breaks provided as needed. Shifted focus this session to include more gait based postural strengthening. Pt does not provide good detail about worsening leg symptoms, reports to have total leg numbness after AMB, but then when she is educated that the sciatic nerve does not innervate the anterior proximal thigh, she then updates her symptoms and says she can feel her leg. Stamina remains quite limited, pt only tolerating about 27ft per bout, however pt does tolerate more when broken into interval training. AMB Pt maintains high level of focus and motivation. Extensive verbal, visual, and tactile cues are provided for most accurate form possible. Author provides minA intermittently for full ROM when needed. Overall pt continues to make steady progress toward treatment goals.    Rehab Potential  Fair    Clinical Impairments Affecting Rehab Potential  comorbidities, symptom duration, curvature degree    PT Frequency  2x / week    PT Duration  8 weeks    PT Treatment/Interventions  ADLs/Self Care Home Management;Aquatic Therapy;Cryotherapy;Ultrasound;Parrafin;Traction;Moist Heat;Electrical Stimulation;DME Instruction;Gait training;Stair training;Functional mobility training;Neuromuscular re-education;Balance training;Therapeutic exercise;Therapeutic activities;Patient/family education;Energy conservation;Spinal Manipulations;Joint Manipulations;Passive range of motion;Dry needling;Manual techniques    PT Next Visit Plan  Progress Hip and core strengthening as tolerated    PT Home Exercise Plan  Added standing pec stretch in doorway to HEP today    Consulted and Agree with Plan of Care  Patient       Patient will benefit from skilled therapeutic intervention in order to improve the following deficits and impairments:  Abnormal gait, Decreased balance, Decreased endurance, Decreased  mobility, Difficulty walking, Hypomobility, Increased muscle spasms, Decreased range of motion, Pain, Postural dysfunction, Impaired flexibility, Increased fascial restricitons, Decreased strength, Decreased activity tolerance  Visit Diagnosis: Other idiopathic scoliosis, lumbar region  Radiculopathy, lumbar region     Problem List Patient Active Problem List   Diagnosis Date Noted  . Central sleep apnea 10/09/2016  . Hypertension 10/09/2016  . Lumbar postlaminectomy syndrome 10/09/2016  . Lumbar radiculopathy 10/09/2016  . Diabetic polyneuropathy ([place]) 10/07/2016  . Right lumbosacral radiculopathy 10/07/2016  . Healthcare maintenance 08/26/2016  . DKA (diabetic ketoacidoses) ([place]) 06/14/2016  . Atypical chest pain 02/23/2016  . SOB (shortness of breath) 02/23/2016  . Pain medication agreement signed 01/13/2015  . Acquired hypothyroidism 05/30/2014  . Frontal sinusitis 12/17/2013  . Atrial fibrillation ([place]) 11/29/2013  . Major depression in remission ([place]) 11/20/2013  . 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 Chronic pain syndrome 08/27/2013  . Back pain 07/17/2013  . HTN (hypertension), benign 07/17/2013  . Hyperlipidemia 07/17/2013  . Pancreatic insufficiency 07/17/2013  . Chronic postoperative pain 01/01/2013  . Long term current use of opiate analgesic 11/04/2012  . Hypothyroidism 08/25/2012  . Radiculopathy of leg 05/01/2011   10:34 AM, 11/13/18 Etta Grandchild, PT, DPT Physical Therapist - [place] [phone] (Office)   , C 11/13/2018, 10:21 AM  [facility] [address], [place], [address] Phone: [phone]   Fax:  [phone]  Name: Colleen Payne MRN: [id number] Date of Birth: [birth year]

## 2018-11-16 ENCOUNTER — Ambulatory Visit: Payer: Medicare Other

## 2018-11-16 ENCOUNTER — Other Ambulatory Visit: Payer: Self-pay

## 2018-11-16 DIAGNOSIS — M4126 Other idiopathic scoliosis, lumbar region: Secondary | ICD-10-CM

## 2018-11-16 DIAGNOSIS — M5416 Radiculopathy, lumbar region: Secondary | ICD-10-CM

## 2018-11-16 NOTE — Therapy (Signed)
[facility] [address], [place], [address] Phone: [phone]   Fax:  [phone]  Physical Therapy Treatment  Patient Details  Name: Colleen Payne MRN: [id number] Date of Birth: Feb 06, 1950 Referring Provider (PT): Traci Sermon MD   Encounter Date: 11/16/2018  PT End of Session - 11/16/18 0954    Visit Number  39    Number of Visits  46    Date for PT Re-Evaluation  11/19/18    Authorization Type  Medicare    Authorization Time Period  6/10    PT Start Time  0949    PT Stop Time  1029    PT Time Calculation (min)  40 min    Activity Tolerance  Patient tolerated treatment well;No increased pain;Patient limited by fatigue;Patient limited by pain    Behavior During Therapy  [facility] for tasks assessed/performed;Flat affect       Past Medical History:  Diagnosis Date  . Anxiety   . Atrial fibrillation ([place])   . Depression   . Diabetes mellitus without complication (HCC)    Type I   . Headache    stress. 1x/month  . Hypertension   . Insulin pump in place   . Motion sickness    cars  . MVP (mitral valve prolapse)   . Neuromuscular disorder (HCC)    leg weakness s/p back surgeries  . Thyroid disease     Past Surgical History:  Procedure Laterality Date  . BACK SURGERY  2004   3 sugeries between Sept and Nov, fusion L2-S1  . CATARACT EXTRACTION W/PHACO Left 11/23/2014   Procedure: CATARACT EXTRACTION PHACO AND INTRAOCULAR LENS PLACEMENT (IOC);  Surgeon: Leandrew Koyanagi, MD;  Location: [facility];  Service: Ophthalmology;  Laterality: Left;  DIABETIC - insulin pump    There were no vitals filed for this visit.  Subjective Assessment - 11/16/18 0951    Subjective  Pt feeling "rushed" today. She had a rought weekend, reports some persistent tooth issues. Pt is trying to get in to her dentist and arrange caregiver services to allow time to be seen.    Pertinent History  Patient  is 68 yo female that complains of lumbar pain and inability to walk erect, as well as hip/abdominal pain. Patient with history of lumbar fusion (L2-S1). Xray results show: Fairly stable appearance of the levoscoliosis of the lower thoracic and of the lumbar spine.Stable lateral subluxation toward the left of L2 with respect L3. Stable partial compression of the body of L1. Patient complains of frustration with physicians, and that her symptoms are worsening over time. 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 Reported that her pain and difficulty worsened after trying to pick her husband up about 1 year ago. Patient mentioned that she has been told that she has "spinal fluid pockets that are leaking". PMH also includes DM I with insulin pump,  afib, HTN,  HLD, chronic pain syndrome, SOB.    Currently in Pain?  Yes    Pain Score  7     Pain Location  --   low back pain      INTERVENTION THIS DATE:  -Thomas Test Stretch, simultaneous bilateral 1x5 minutes -concurrent with above, BUE wand flexion 1x20x1secH  -AMB intervals 3x210ft, 1x c SPC, 2x c RW (0.88m/s) (broken up throughout session) *pt able to better tolerate (less fatigue at end) and AMB faster with RW; postural degradation remains  a consistent phenomenon -STS from slight elevated plinth (56cm) 2x10, BUE flexion  -standing hip extension 2x15 bilat, 2lb AW -steaded hip/flexion 2x15bilat, 2lb AW -standing B hip ABD 1x15 bilat, 2lb AW       PT Short Term Goals - 07/27/18 [id number]      PT SHORT TERM GOAL #1   Title  Patient will be adherent to initial HEP at least 3x a week to improve functional strength and balance for better safety at home.    Time  4    Period  Weeks    Status  Achieved    Target Date  12/17/17        PT Long Term Goals - 10/12/18 1110      PT LONG TERM GOAL #1   Title  Patient will be independent in bending down towards floor and picking up small object (<5 pounds) and  then stand back up without increased pain to improve ability to pick up and clean up room at home    Baseline  09/25/18/20 able to pick up object without increase in pain, though she reports continued baseline pain, goal revised and achieved    Time  8    Period  Weeks    Status  Achieved      PT LONG TERM GOAL #2   Title  Patient will increase BLE gross strength to 4+/5 as to improve functional strength for independent gait, increased standing tolerance and increased ADL ability.    Baseline  08/31/18 R/L flexion: 5/5 ER 5/5 IR 4+/4+ ABD 5/5 Ext 4+/4    Time  8    Period  Weeks    Status  Achieved      PT LONG TERM GOAL #3   Title  Patient will report a worst pain of 5/10 with transfers of her husband to improve tolerance with ADLs     Baseline  10/12/18 8/10 pain with transfers      PT LONG TERM GOAL #4   Title  Pt will decrease 5TSTS to 10sec order to demonstrate clinically significant improvement in LE strength, and reduced fall risk    Baseline  10/12/18 10.1sec 09/25/18 11sec 08/31/18 12sec 07/31/18 13sec 07/20/18 15sec 06/10/18 16sec    Time  8    Period  Days    Status  On-going      PT LONG TERM GOAL #5   Title  Pt will increase 10MWT by at least 0.13 m/s in order to demonstrate clinically significant improvement in community ambulation    Baseline  09/25/18 0.83 07/31/18 0.87m/s 07/20/18 same 06/10/18 0.62m/s    Time  8    Period  Weeks    Status  Achieved      PT LONG TERM GOAL #6   Title  Patient will demonstrate walk speed of 1.2 to demonstrate community ambulation norm    Baseline  10/12/18 0.68m/s 09/25/18 0.44m/s    Time  8    Period  Weeks    Status  On-going      PT LONG TERM GOAL #7   Title  Patient will increase standing tolerance to 7min without increased pain in order to complete household chores/cooking    Baseline  09/25/18 Can stand for 10min with minimal increased pain, but reports it does continue to steadily worsen over 78mins    Time  8    Period  Weeks    Status   On-going      PT LONG TERM GOAL #8  Title  Patient will demonstrate SLS of 10sec to demonstrate decreased fall risk    Baseline  RLE 4sec LLE 6sec    Time  8    Period  Weeks    Status  New            Plan - 11/16/18 0954    Clinical Impression Statement  Continued with current plan of care, gently progressing patient's program aimed at address deficits and limitations identified in evlauation. Pt continues to make steady progress toward treatment goals in general. Authro provides extensive verbal, visual, and tactile cues when needed to assure all interventions are performed with desired form and good accuracy. Extensive communicaiton to assure pt is able to perform all activities without exacerbation of pain or other symptoms. Pt requires encouragement and education to explain how exercise intensity relates to improving physiological adaptation, as she sees repeated AMB intervals as detrimental to leg function. Able to progress AW strengthening activity this date. RW truly does make a significant difference in activity tolerance, as 217ft is a difficult distance with SPC, would consider utilizing in the future when building endurance and AMB tolerance.     Stability/Clinical Decision Making  Evolving/Moderate complexity    Clinical Decision Making  Moderate    Rehab Potential  Fair    Clinical Impairments Affecting Rehab Potential  comorbidities, symptom duration, curvature degree    PT Frequency  2x / week    PT Duration  8 weeks    PT Treatment/Interventions  ADLs/Self Care Home Management;Aquatic Therapy;Cryotherapy;Ultrasound;Parrafin;Traction;Moist Heat;Electrical Stimulation;DME Instruction;Gait training;Stair training;Functional mobility training;Neuromuscular re-education;Balance training;Therapeutic exercise;Therapeutic activities;Patient/family education;Energy conservation;Spinal Manipulations;Joint Manipulations;Passive range of motion;Dry needling;Manual techniques    PT  Next Visit Plan  Progress Hip and core strengthening as tolerated    PT Home Exercise Plan  Added standing pec stretch in doorway to HEP today    Consulted and Agree with Plan of Care  Patient       Patient will benefit from skilled therapeutic intervention in order to improve the following deficits and impairments:  Abnormal gait, Decreased balance, Decreased endurance, Decreased mobility, Difficulty walking, Hypomobility, Increased muscle spasms, Decreased range of motion, Pain, Postural dysfunction, Impaired flexibility, Increased fascial restricitons, Decreased strength, Decreased activity tolerance  Visit Diagnosis: Other idiopathic scoliosis, lumbar region  Radiculopathy, lumbar region     Problem List Patient Active Problem List   Diagnosis Date Noted  . Central sleep apnea 10/09/2016  . Hypertension 10/09/2016  . Lumbar postlaminectomy syndrome 10/09/2016  . Lumbar radiculopathy 10/09/2016  . Diabetic polyneuropathy ([place]) 10/07/2016  . Right lumbosacral radiculopathy 10/07/2016  . Healthcare maintenance 08/26/2016  . DKA (diabetic ketoacidoses) ([place]) 06/14/2016  . Atypical chest pain 02/23/2016  . SOB (shortness of breath) 02/23/2016  . Pain medication agreement signed 01/13/2015  . Acquired hypothyroidism 05/30/2014  . Frontal sinusitis 12/17/2013  . Atrial fibrillation ([place]) 11/29/2013  . Major depression in remission ([place]) 11/20/2013  . 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 Chronic pain syndrome 08/27/2013  . Back pain 07/17/2013  . HTN (hypertension), benign 07/17/2013  . Hyperlipidemia 07/17/2013  . Pancreatic insufficiency 07/17/2013  . Chronic postoperative pain 01/01/2013  . Long term current use of opiate analgesic 11/04/2012  . Hypothyroidism 08/25/2012  . Radiculopathy of leg 05/01/2011   10:06 AM, 11/16/18 Etta Grandchild, PT, DPT Physical Therapist - [place] [phone] (Office)    ,  C 11/16/2018, 9:59 AM  [facility] [address], [place], [address] Phone: [phone]   Fax:  [phone]  Name: Colleen Payne MRN: [id number] Date of Birth: 1950-04-26

## 2018-11-20 ENCOUNTER — Ambulatory Visit: Payer: Medicare Other | Admitting: Physical Therapy

## 2018-11-27 ENCOUNTER — Ambulatory Visit: Payer: Medicare Other | Attending: Anesthesiology | Admitting: Physical Therapy

## 2018-11-27 DIAGNOSIS — M4126 Other idiopathic scoliosis, lumbar region: Secondary | ICD-10-CM | POA: Insufficient documentation

## 2018-11-27 DIAGNOSIS — M5416 Radiculopathy, lumbar region: Secondary | ICD-10-CM | POA: Insufficient documentation

## 2018-11-30 ENCOUNTER — Ambulatory Visit: Payer: Medicare Other | Admitting: Physical Therapy

## 2018-12-04 ENCOUNTER — Encounter: Payer: Medicare Other | Admitting: Physical Therapy

## 2018-12-07 ENCOUNTER — Other Ambulatory Visit: Payer: Self-pay

## 2018-12-07 ENCOUNTER — Ambulatory Visit: Payer: Medicare Other | Admitting: Physical Therapy

## 2018-12-07 ENCOUNTER — Encounter: Payer: Self-pay | Admitting: Physical Therapy

## 2018-12-07 DIAGNOSIS — M4126 Other idiopathic scoliosis, lumbar region: Secondary | ICD-10-CM

## 2018-12-07 DIAGNOSIS — M5416 Radiculopathy, lumbar region: Secondary | ICD-10-CM

## 2018-12-07 NOTE — Therapy (Addendum)
[facility] [address], [place], [address] Phone: [phone]   Fax:  [phone]  Physical Therapy Treatment/Progress Note Reporting Period 10/05/18 - 12/07/18  Patient Details  Name: Colleen Payne MRN: [id number] Date of Birth: [birth year] Referring Provider (PT): Traci Sermon MD   Encounter Date: 12/07/2018  PT End of Session - 12/07/18 1025    Visit Number  40    Number of Visits  33    Date for PT Re-Evaluation  02/01/19    Authorization Type  Medicare    Authorization Time Period  7/10    PT Start Time  1018    PT Stop Time  1100    PT Time Calculation (min)  42 min    Activity Tolerance  Patient tolerated treatment well;No increased pain;Patient limited by fatigue;Patient limited by pain    Behavior During Therapy  [facility] for tasks assessed/performed;Flat affect       Past Medical History:  Diagnosis Date  . Anxiety   . Atrial fibrillation ([place])   . Depression   . Diabetes mellitus without complication (HCC)    Type I   . Headache    stress. 1x/month  . Hypertension   . Insulin pump in place   . Motion sickness    cars  . MVP (mitral valve prolapse)   . Neuromuscular disorder (HCC)    leg weakness s/p back surgeries  . Thyroid disease     Past Surgical History:  Procedure Laterality Date  . BACK SURGERY  2004   3 sugeries between Sept and Nov, fusion L2-S1  . CATARACT EXTRACTION W/PHACO Left 11/23/2014   Procedure: CATARACT EXTRACTION PHACO AND INTRAOCULAR LENS PLACEMENT (IOC);  Surgeon: Leandrew Koyanagi, MD;  Location: [facility];  Service: Ophthalmology;  Laterality: Left;  DIABETIC - insulin pump    There were no vitals filed for this visit.  Subjective Assessment - 12/07/18 1022    Subjective  Patient has been absent from PT for 3 weeks d/t having trouble with consistent caretaker for her husband. Reports her pain and motion has been much worse sense being  gone from PT, endorses 10/10 pain "all up the spine" today.    Pertinent History  Patient is 68 yo female that complains of lumbar pain and inability to walk erect, as well as hip/abdominal pain. Patient with history of lumbar fusion (L2-S1). Xray results show: Fairly stable appearance of the levoscoliosis of the lower thoracic and of the lumbar spine.Stable lateral subluxation toward the left of L2 with respect L3. Stable partial compression of the body of L1. Patient complains of frustration with physicians, and that her symptoms are worsening over time. 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 Reported that her pain and difficulty worsened after trying to pick her husband up about 1 year ago. Patient mentioned that she has been told that she has "spinal fluid pockets that are leaking". PMH also includes DM I with insulin pump,  afib, HTN,  HLD, chronic pain syndrome, SOB.    Limitations  Standing;Walking;House hold activities;Other (comment)    Diagnostic tests  see exray results: Fairly stable appearance of the levoscoliosis of the lower thoracic and lumbar spine. Stable lateral subluxation toward left of L2 with respect of L3, stable partial compression of the body of L1.    Patient Stated Goals  pain relief, standing erect       INTERVENTION THIS DATE:  -  Thomas Test Stretch, simultaneous bilateral 1x5 minutes -concurrent with above, BUE wand flexion 1x20x1secH  - 10MWT x2 trials best time 13sec with SPC - 5xSTS 2 trials best time sec - SLS 3 trials each LE best time RLE 4sec LLE 4.2sec - Sidelying open book x15 each with increased difficulty with L rotation - Cat cow 2x 10 with childs pose at the end of each set with 30sec hold  - Nustep seat 6; UE 7>6; L1>2 AAROM bilat scapular protraction/retraction and thoracic rotation with no increased pain                        PT Education - 12/07/18 1024    Education Details  therex  form    Person(s) Educated  Patient    Methods  Explanation;Demonstration;Tactile cues;Verbal cues    Comprehension  Verbalized understanding;Returned demonstration;Verbal cues required;Tactile cues required       PT Short Term Goals - 07/27/18 0933      PT SHORT TERM GOAL #1   Title  Patient will be adherent to initial HEP at least 3x a week to improve functional strength and balance for better safety at home.    Time  4    Period  Weeks    Status  Achieved    Target Date  12/17/17        PT Long Term Goals - 12/07/18 1028      PT LONG TERM GOAL #1   Title  Patient will be independent in bending down towards floor and picking up small object (<5 pounds) and then stand back up without increased pain to improve ability to pick up and clean up room at home    Baseline  09/25/18/20 able to pick up object without increase in pain, though she reports continued baseline pain, goal revised and achieved    Time  8    Period  Weeks    Status  Achieved      PT LONG TERM GOAL #2   Title  Patient will increase BLE gross strength to 4+/5 as to improve functional strength for independent gait, increased standing tolerance and increased ADL ability.    Baseline  08/31/18 R/L flexion: 5/5 ER 5/5 IR 4+/4+ ABD 5/5 Ext 4+/4    Time  8    Period  Weeks    Status  Achieved      PT LONG TERM GOAL #3   Title  Patient will report a worst pain of 5/10 with transfers of her husband to improve tolerance with ADLs     Baseline  12/07/18 10/10 pain with transfers    Time  8    Period  Weeks    Status  On-going      PT LONG TERM GOAL #4   Title  Pt will decrease 5TSTS to 10sec order to demonstrate clinically significant improvement in LE strength, and reduced fall risk    Baseline  12/07/18 17sec 10/12/18 10.1sec 09/25/18 11sec 08/31/18 12sec 07/31/18 13sec 07/20/18 15sec 06/10/18 16sec    Time  8    Period  Weeks    Status  On-going      PT LONG TERM GOAL #5   Title  Pt will increase 10MWT by at least  0.13 m/s in order to demonstrate clinically significant improvement in community ambulation    Baseline  09/25/18 0.83 07/31/18 0.82m/s 07/20/18 same 06/10/18 0.48m/s    Time  8    Period  [place]  Status  Achieved      PT LONG TERM GOAL #6   Title  Patient will demonstrate walk speed of 1.2 to demonstrate community ambulation norm    Baseline  12/07/18 0.24m/s 10/12/18 0.67m/s 09/25/18 0.2m/s    Time  8    Period  Weeks    Status  On-going      PT LONG TERM GOAL #7   Title  Patient will increase standing tolerance to 54min without increased pain in order to complete household chores/cooking    Baseline  12/07/18 Can stand only stand for 15 mins d/t decreased tolerance    Time  8    Period  Weeks    Status  On-going      PT LONG TERM GOAL #8   Title  Patient will demonstrate SLS of 10sec to demonstrate decreased fall risk    Baseline  12/07/18 RLE 4sec LLE 4.2sec 11/19/18 RLE 4sec LLE 6sec    Time  8    Period  Weeks    Status  On-going            Plan - 12/07/18 1048    Clinical Impression Statement  Goals reassess this date for recertification. 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 Regression demonstrates need for continued PT services and decline in strength and endurance without PT services. Following session pain reduction from 10/10 to 7/10. PT will continue to address impairments to return patinet to optimal level of functioning and independence    Stability/Clinical Decision Making  Evolving/Moderate complexity    Clinical Decision Making  Moderate    Rehab Potential  Fair    Clinical Impairments Affecting Rehab Potential  comorbidities, symptom duration, curvature degree    PT Frequency  2x / week    PT Duration  8 weeks    PT Treatment/Interventions  ADLs/Self Care Home Management;Aquatic Therapy;Cryotherapy;Ultrasound;Parrafin;Traction;Moist  Heat;Electrical Stimulation;DME Instruction;Gait training;Stair training;Functional mobility training;Neuromuscular re-education;Balance training;Therapeutic exercise;Therapeutic activities;Patient/family education;Energy conservation;Spinal Manipulations;Joint Manipulations;Passive range of motion;Dry needling;Manual techniques    PT Next Visit Plan  Progress Hip and core strengthening as tolerated    PT Home Exercise Plan  Added standing pec stretch in doorway to HEP today    Consulted and Agree with Plan of Care  Patient       Patient will benefit from skilled therapeutic intervention in order to improve the following deficits and impairments:  Abnormal gait, Decreased balance, Decreased endurance, Decreased mobility, Difficulty walking, Hypomobility, Increased muscle spasms, Decreased range of motion, Pain, Postural dysfunction, Impaired flexibility, Increased fascial restricitons, Decreased strength, Decreased activity tolerance  Visit Diagnosis: Other idiopathic scoliosis, lumbar region  Radiculopathy, lumbar region     Problem List Patient Active Problem List   Diagnosis Date Noted  . Central sleep apnea 10/09/2016  . Hypertension 10/09/2016  . Lumbar postlaminectomy syndrome 10/09/2016  . Lumbar radiculopathy 10/09/2016  . Diabetic polyneuropathy ([place]) 10/07/2016  . Right lumbosacral radiculopathy 10/07/2016  . Healthcare maintenance 08/26/2016  . DKA (diabetic ketoacidoses) ([place]) 06/14/2016  . Atypical chest pain 02/23/2016  . SOB (shortness of breath) 02/23/2016  . Pain medication agreement signed 01/13/2015  . Acquired hypothyroidism 05/30/2014  . Frontal sinusitis 12/17/2013  . Atrial fibrillation ([place]) 11/29/2013  . Major depression in remission ([place]) 11/20/2013  . 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 Chronic pain syndrome 08/27/2013  . Back  pain 07/17/2013  . HTN (hypertension), benign 07/17/2013  . Hyperlipidemia  07/17/2013  . Pancreatic insufficiency 07/17/2013  . Chronic postoperative pain 01/01/2013  . Long term current use of opiate analgesic 11/04/2012  . Hypothyroidism 08/25/2012  . Radiculopathy of leg 05/01/2011   Shelton Silvas PT, DPT Shelton Silvas 12/07/2018, 11:13 AM  [facility] [address] [place], [address] Phone: [phone]   Fax:  [phone]  Name: Colleen Payne MRN: [id number] Date of Birth: 11-Mar-1950

## 2018-12-11 ENCOUNTER — Ambulatory Visit: Payer: Medicare Other | Admitting: Physical Therapy

## 2018-12-11 ENCOUNTER — Other Ambulatory Visit: Payer: Self-pay

## 2018-12-11 ENCOUNTER — Encounter: Payer: Self-pay | Admitting: Physical Therapy

## 2018-12-11 DIAGNOSIS — M5416 Radiculopathy, lumbar region: Secondary | ICD-10-CM

## 2018-12-11 DIAGNOSIS — M4126 Other idiopathic scoliosis, lumbar region: Secondary | ICD-10-CM

## 2018-12-11 NOTE — Therapy (Signed)
[facility] [address], [place], [address] Phone: [phone]   Fax:  [phone]  Physical Therapy Treatment  Patient Details  Name: Colleen Payne MRN: [id number] Date of Birth: 18-Dec-1950 Referring Provider (PT): Traci Sermon MD   Encounter Date: 12/11/2018  PT End of Session - 12/11/18 1013    Visit Number  41    Number of Visits  24    Date for PT Re-Evaluation  02/01/19    Authorization Type  Medicare    Authorization Time Period  1/10    PT Start Time  [id number]    PT Stop Time  1045    PT Time Calculation (min)  43 min    Activity Tolerance  Patient tolerated treatment well;No increased pain;Patient limited by fatigue;Patient limited by pain    Behavior During Therapy  [facility] for tasks assessed/performed;Flat affect       Past Medical History:  Diagnosis Date  . Anxiety   . Atrial fibrillation ([place])   . Depression   . Diabetes mellitus without complication (HCC)    Type I   . Headache    stress. 1x/month  . Hypertension   . Insulin pump in place   . Motion sickness    cars  . MVP (mitral valve prolapse)   . Neuromuscular disorder (HCC)    leg weakness s/p back surgeries  . Thyroid disease     Past Surgical History:  Procedure Laterality Date  . BACK SURGERY  2004   3 sugeries between Sept and Nov, fusion L2-S1  . CATARACT EXTRACTION W/PHACO Left 11/23/2014   Procedure: CATARACT EXTRACTION PHACO AND INTRAOCULAR LENS PLACEMENT (IOC);  Surgeon: Leandrew Koyanagi, MD;  Location: [facility];  Service: Ophthalmology;  Laterality: Left;  DIABETIC - insulin pump    There were no vitals filed for this visit.  Subjective Assessment - 12/11/18 1010    Subjective  Continues to report L shoulder that she is "going to see a doctor for". Reports 7/10 pain following last session, that continues today.    Pertinent History  Patient is 68 yo female that complains of lumbar pain and  inability to walk erect, as well as hip/abdominal pain. Patient with history of lumbar fusion (L2-S1). Xray results show: Fairly stable appearance of the levoscoliosis of the lower thoracic and of the lumbar spine.Stable lateral subluxation toward the left of L2 with respect L3. Stable partial compression of the body of L1. Patient complains of frustration with physicians, and that her symptoms are worsening over time. 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 Reported that her pain and difficulty worsened after trying to pick her husband up about 1 year ago. Patient mentioned that she has been told that she has "spinal fluid pockets that are leaking". PMH also includes DM I with insulin pump,  afib, HTN,  HLD, chronic pain syndrome, SOB.    Limitations  Standing;Walking;House hold activities;Other (comment)    Diagnostic tests  see exray results: Fairly stable appearance of the levoscoliosis of the lower thoracic and lumbar spine. Stable lateral subluxation toward left of L2 with respect of L3, stable partial compression of the body of L1.    Patient Stated Goals  pain relief, standing erect    Pain Onset  More than a month ago    Pain Onset  More than a month ago       INTERVENTION THIS DATE:  -  [employer], simultaneous bilateral 1x5 minutes -concurrent with above, BUE wand flexion 1x20x5secH  - Qped hip ext x10 each LE - Qped alt bird dogs 2x 10 each side with min cuing for full hip and knee ext; TC during second set for stabilization with good carry over [place] pose between qped therex x41min - Squat from 70d 3x 10 with tap to mat table and pipe overhead, cuing for R knee control, difficulty standing without R knee valgus -Good mornings with pipe overhead 3x 10 with cuing for max lumbar/thoracic ext with decent carry over                        PT Education - 12/11/18 1013    Education Details  therex form    Person(s)  Educated  Patient    Methods  Explanation;Demonstration;Verbal cues    Comprehension  Verbalized understanding;Returned demonstration;Verbal cues required       PT Short Term Goals - 07/27/18 0933      PT SHORT TERM GOAL #1   Title  Patient will be adherent to initial HEP at least 3x a week to improve functional strength and balance for better safety at home.    Time  4    Period  Weeks    Status  Achieved    Target Date  12/17/17        PT Long Term Goals - 12/07/18 1028      PT LONG TERM GOAL #1   Title  Patient will be independent in bending down towards floor and picking up small object (<5 pounds) and then stand back up without increased pain to improve ability to pick up and clean up room at home    Baseline  09/25/18/20 able to pick up object without increase in pain, though she reports continued baseline pain, goal revised and achieved    Time  8    Period  Weeks    Status  Achieved      PT LONG TERM GOAL #2   Title  Patient will increase BLE gross strength to 4+/5 as to improve functional strength for independent gait, increased standing tolerance and increased ADL ability.    Baseline  08/31/18 R/L flexion: 5/5 ER 5/5 IR 4+/4+ ABD 5/5 Ext 4+/4    Time  8    Period  Weeks    Status  Achieved      PT LONG TERM GOAL #3   Title  Patient will report a worst pain of 5/10 with transfers of her husband to improve tolerance with ADLs     Baseline  12/07/18 10/10 pain with transfers    Time  8    Period  Weeks    Status  On-going      PT LONG TERM GOAL #4   Title  Pt will decrease 5TSTS to 10sec order to demonstrate clinically significant improvement in LE strength, and reduced fall risk    Baseline  12/07/18 17sec 10/12/18 10.1sec 09/25/18 11sec 08/31/18 12sec 07/31/18 13sec 07/20/18 15sec 06/10/18 16sec    Time  8    Period  Weeks    Status  On-going      PT LONG TERM GOAL #5   Title  Pt will increase 10MWT by at least 0.13 m/s in order to demonstrate clinically significant  improvement in community ambulation    Baseline  09/25/18 0.83 07/31/18 0.19m/s 07/20/18 same 06/10/18 0.51m/s    Time  8  Period  Weeks    Status  Achieved      PT LONG TERM GOAL #6   Title  Patient will demonstrate walk speed of 1.2 to demonstrate community ambulation norm    Baseline  12/07/18 0.73m/s 10/12/18 0.29m/s 09/25/18 0.56m/s    Time  8    Period  Weeks    Status  On-going      PT LONG TERM GOAL #7   Title  Patient will increase standing tolerance to 77min without increased pain in order to complete household chores/cooking    Baseline  12/07/18 Can stand only stand for 15 mins d/t decreased tolerance    Time  8    Period  Weeks    Status  On-going      PT LONG TERM GOAL #8   Title  Patient will demonstrate SLS of 10sec to demonstrate decreased fall risk    Baseline  12/07/18 RLE 4sec LLE 4.2sec 11/19/18 RLE 4sec LLE 6sec    Time  8    Period  Weeks    Status  On-going            Plan - 12/11/18 1033    Clinical Impression Statement  PT continued therex progression this session for ext posture with good success. Pt motivated throughout session and able to comply with all cuing for proper technique/muscle activation with good carry over. PT will continue progression as able.    Stability/Clinical Decision Making  Evolving/Moderate complexity    Clinical Decision Making  Moderate    Rehab Potential  Fair    Clinical Impairments Affecting Rehab Potential  comorbidities, symptom duration, curvature degree    PT Frequency  2x / week    PT Duration  8 weeks    PT Treatment/Interventions  ADLs/Self Care Home Management;Aquatic Therapy;Cryotherapy;Ultrasound;Parrafin;Traction;Moist Heat;Electrical Stimulation;DME Instruction;Gait training;Stair training;Functional mobility training;Neuromuscular re-education;Balance training;Therapeutic exercise;Therapeutic activities;Patient/family education;Energy conservation;Spinal Manipulations;Joint Manipulations;Passive range of  motion;Dry needling;Manual techniques    PT Next Visit Plan  Progress Hip and core strengthening as tolerated    PT Home Exercise Plan  Added standing pec stretch in doorway to HEP today    Consulted and Agree with Plan of Care  Patient       Patient will benefit from skilled therapeutic intervention in order to improve the following deficits and impairments:  Abnormal gait, Decreased balance, Decreased endurance, Decreased mobility, Difficulty walking, Hypomobility, Increased muscle spasms, Decreased range of motion, Pain, Postural dysfunction, Impaired flexibility, Increased fascial restricitons, Decreased strength, Decreased activity tolerance  Visit Diagnosis: Other idiopathic scoliosis, lumbar region  Radiculopathy, lumbar region     Problem List Patient Active Problem List   Diagnosis Date Noted  . Central sleep apnea 10/09/2016  . Hypertension 10/09/2016  . Lumbar postlaminectomy syndrome 10/09/2016  . Lumbar radiculopathy 10/09/2016  . Diabetic polyneuropathy ([place]) 10/07/2016  . Right lumbosacral radiculopathy 10/07/2016  . Healthcare maintenance 08/26/2016  . DKA (diabetic ketoacidoses) ([place]) 06/14/2016  . Atypical chest pain 02/23/2016  . SOB (shortness of breath) 02/23/2016  . Pain medication agreement signed 01/13/2015  . Acquired hypothyroidism 05/30/2014  . Frontal sinusitis 12/17/2013  . Atrial fibrillation ([place]) 11/29/2013  . Major depression in remission ([place]) 11/20/2013  . 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 Chronic pain syndrome 08/27/2013  . Back pain 07/17/2013  . HTN (hypertension), benign 07/17/2013  . Hyperlipidemia 07/17/2013  . Pancreatic insufficiency 07/17/2013  . Chronic postoperative pain 01/01/2013  . Long term current use of opiate  analgesic 11/04/2012  . Hypothyroidism 08/25/2012  . Radiculopathy of leg 05/01/2011   Shelton Silvas PT, DPT Shelton Silvas 12/11/2018, 11:10 AM  [facility] [address] [place], [address] Phone: [phone]   Fax:  [phone]  Name: ZEKIA GRAHEK MRN: [id number] Date of Birth: 17-Jul-1950

## 2018-12-14 ENCOUNTER — Ambulatory Visit: Payer: Medicare Other | Admitting: Physical Therapy

## 2018-12-14 ENCOUNTER — Other Ambulatory Visit: Payer: Self-pay

## 2018-12-14 ENCOUNTER — Encounter: Payer: Self-pay | Admitting: Physical Therapy

## 2018-12-14 DIAGNOSIS — M5416 Radiculopathy, lumbar region: Secondary | ICD-10-CM

## 2018-12-14 DIAGNOSIS — M4126 Other idiopathic scoliosis, lumbar region: Secondary | ICD-10-CM | POA: Diagnosis not present

## 2018-12-14 NOTE — Therapy (Signed)
[facility] [address], [place], [address] Phone: [phone]   Fax:  [phone]  Physical Therapy Treatment  Patient Details  Name: Colleen Payne MRN: [id number] Date of Birth: 1950/06/27 Referring Provider (PT): Traci Sermon MD   Encounter Date: 12/14/2018  PT End of Session - 12/14/18 1038    Visit Number  41    Number of Visits  38    Date for PT Re-Evaluation  02/01/19    Authorization Type  Medicare    Authorization Time Period  2/10    PT Start Time  1033    PT Stop Time  1115    PT Time Calculation (min)  42 min    Activity Tolerance  Patient tolerated treatment well;No increased pain    Behavior During Therapy  [facility] for tasks assessed/performed;Flat affect       Past Medical History:  Diagnosis Date  . Anxiety   . Atrial fibrillation ([place])   . Depression   . Diabetes mellitus without complication (HCC)    Type I   . Headache    stress. 1x/month  . Hypertension   . Insulin pump in place   . Motion sickness    cars  . MVP (mitral valve prolapse)   . Neuromuscular disorder (HCC)    leg weakness s/p back surgeries  . Thyroid disease     Past Surgical History:  Procedure Laterality Date  . BACK SURGERY  2004   3 sugeries between Sept and Nov, fusion L2-S1  . CATARACT EXTRACTION W/PHACO Left 11/23/2014   Procedure: CATARACT EXTRACTION PHACO AND INTRAOCULAR LENS PLACEMENT (IOC);  Surgeon: Leandrew Koyanagi, MD;  Location: [facility];  Service: Ophthalmology;  Laterality: Left;  DIABETIC - insulin pump    There were no vitals filed for this visit.  Subjective Assessment - 12/14/18 1034    Subjective  Patient reports 7/10 pain today. Reports she is having increased pain with getting up out of bed, but is sleeping on her back.    Pertinent History  Patient is 68 yo female that complains of lumbar pain and inability to walk erect, as well as hip/abdominal pain.  Patient with history of lumbar fusion (L2-S1). Xray results show: Fairly stable appearance of the levoscoliosis of the lower thoracic and of the lumbar spine.Stable lateral subluxation toward the left of L2 with respect L3. Stable partial compression of the body of L1. Patient complains of frustration with physicians, and that her symptoms are worsening over time. 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 Reported that her pain and difficulty worsened after trying to pick her husband up about 1 year ago. Patient mentioned that she has been told that she has "spinal fluid pockets that are leaking". PMH also includes DM I with insulin pump,  afib, HTN,  HLD, chronic pain syndrome, SOB.    Limitations  Standing;Walking;House hold activities;Other (comment)    Diagnostic tests  see exray results: Fairly stable appearance of the levoscoliosis of the lower thoracic and lumbar spine. Stable lateral subluxation toward left of L2 with respect of L3, stable partial compression of the body of L1.    Patient Stated Goals  pain relief, standing erect    Pain Onset  More than a month ago       INTERVENTION THIS DATE:  -Thomas Test Stretch, simultaneous bilateral 1x5 minutes -concurrent with above, BUE wand flexion 1x20x5secH  - Attempted  thoracic ext with bilat scapular retraction prone with little success - Thoracic ext with bilat scapular retraction from theraball 2x 10  - Patinet unable to obtain kneeling position today d/t tension in abdominals and hip flexors - Prone alt supermans 2x 6 (each) with min cuing for proper technique with good carry over - Prone press up 2x 10 with PT stabilizing at pelvis - MATRIX hip ext 25# x10; 40# 2x 10 with cuing for overexaggerated ext posture with good carry over; setting 7  - Leg swings ant/post with unilateral UE support, much better posture than beginning of session - Stair hip flexor stretch 3x 10sec holds each with good  carry over following demo with some TC needed for increased ROM for stretch                          PT Education - 12/14/18 1037    Education Details  therex form    Person(s) Educated  Patient    Methods  Explanation;Demonstration;Verbal cues    Comprehension  Verbalized understanding;Returned demonstration;Verbal cues required       PT Short Term Goals - 07/27/18 0933      PT SHORT TERM GOAL #1   Title  Patient will be adherent to initial HEP at least 3x a week to improve functional strength and balance for better safety at home.    Time  4    Period  Weeks    Status  Achieved    Target Date  12/17/17        PT Long Term Goals - 12/07/18 1028      PT LONG TERM GOAL #1   Title  Patient will be independent in bending down towards floor and picking up small object (<5 pounds) and then stand back up without increased pain to improve ability to pick up and clean up room at home    Baseline  09/25/18/20 able to pick up object without increase in pain, though she reports continued baseline pain, goal revised and achieved    Time  8    Period  Weeks    Status  Achieved      PT LONG TERM GOAL #2   Title  Patient will increase BLE gross strength to 4+/5 as to improve functional strength for independent gait, increased standing tolerance and increased ADL ability.    Baseline  08/31/18 R/L flexion: 5/5 ER 5/5 IR 4+/4+ ABD 5/5 Ext 4+/4    Time  8    Period  Weeks    Status  Achieved      PT LONG TERM GOAL #3   Title  Patient will report a worst pain of 5/10 with transfers of her husband to improve tolerance with ADLs     Baseline  12/07/18 10/10 pain with transfers    Time  8    Period  Weeks    Status  On-going      PT LONG TERM GOAL #4   Title  Pt will decrease 5TSTS to 10sec order to demonstrate clinically significant improvement in LE strength, and reduced fall risk    Baseline  12/07/18 17sec 10/12/18 10.1sec 09/25/18 11sec 08/31/18 12sec 07/31/18 13sec  07/20/18 15sec 06/10/18 16sec    Time  8    Period  Weeks    Status  On-going      PT LONG TERM GOAL #5   Title  Pt will increase 10MWT by at least 0.13 m/s in order  to demonstrate clinically significant improvement in community ambulation    Baseline  09/25/18 0.83 07/31/18 0.70m/s 07/20/18 same 06/10/18 0.29m/s    Time  8    Period  Weeks    Status  Achieved      PT LONG TERM GOAL #6   Title  Patient will demonstrate walk speed of 1.2 to demonstrate community ambulation norm    Baseline  12/07/18 0.22m/s 10/12/18 0.80m/s 09/25/18 0.81m/s    Time  8    Period  Weeks    Status  On-going      PT LONG TERM GOAL #7   Title  Patient will increase standing tolerance to 73min without increased pain in order to complete household chores/cooking    Baseline  12/07/18 Can stand only stand for 15 mins d/t decreased tolerance    Time  8    Period  Weeks    Status  On-going      PT LONG TERM GOAL #8   Title  Patient will demonstrate SLS of 10sec to demonstrate decreased fall risk    Baseline  12/07/18 RLE 4sec LLE 4.2sec 11/19/18 RLE 4sec LLE 6sec    Time  8    Period  Weeks    Status  On-going            Plan - 12/14/18 1100    Clinical Impression Statement  PT continued progression for extension activation with good success. Patient with increased abdominal/hip flexor tension this session, requiring some modifications, increased time spent on mobility therex, with patient able to maintain motivation and comoplete all modified therex with proper technique. Pt reports decreased pain following session, reporting she does not notice having much pain at the end of session.    Stability/Clinical Decision Making  Evolving/Moderate complexity    Clinical Decision Making  Moderate    Rehab Potential  Fair    Clinical Impairments Affecting Rehab Potential  comorbidities, symptom duration, curvature degree    PT Frequency  2x / week    PT Duration  8 weeks    PT Treatment/Interventions  ADLs/Self  Care Home Management;Aquatic Therapy;Cryotherapy;Ultrasound;Parrafin;Traction;Moist Heat;Electrical Stimulation;DME Instruction;Gait training;Stair training;Functional mobility training;Neuromuscular re-education;Balance training;Therapeutic exercise;Therapeutic activities;Patient/family education;Energy conservation;Spinal Manipulations;Joint Manipulations;Passive range of motion;Dry needling;Manual techniques    PT Next Visit Plan  Progress Hip and core strengthening as tolerated    PT Home Exercise Plan  Added standing pec stretch in doorway to HEP today    Consulted and Agree with Plan of Care  Patient       Patient will benefit from skilled therapeutic intervention in order to improve the following deficits and impairments:  Abnormal gait, Decreased balance, Decreased endurance, Decreased mobility, Difficulty walking, Hypomobility, Increased muscle spasms, Decreased range of motion, Pain, Postural dysfunction, Impaired flexibility, Increased fascial restricitons, Decreased strength, Decreased activity tolerance  Visit Diagnosis: Other idiopathic scoliosis, lumbar region  Radiculopathy, lumbar region     Problem List Patient Active Problem List   Diagnosis Date Noted  . Central sleep apnea 10/09/2016  . Hypertension 10/09/2016  . Lumbar postlaminectomy syndrome 10/09/2016  . Lumbar radiculopathy 10/09/2016  . Diabetic polyneuropathy ([place]) 10/07/2016  . Right lumbosacral radiculopathy 10/07/2016  . Healthcare maintenance 08/26/2016  . DKA (diabetic ketoacidoses) ([place]) 06/14/2016  . Atypical chest pain 02/23/2016  . SOB (shortness of breath) 02/23/2016  . Pain medication agreement signed 01/13/2015  . Acquired hypothyroidism 05/30/2014  . Frontal sinusitis 12/17/2013  . Atrial fibrillation ([place]) 11/29/2013  . Major depression in remission ([place]) 11/20/2013  .  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 Chronic pain syndrome 08/27/2013  . Back  pain 07/17/2013  . HTN (hypertension), benign 07/17/2013  . Hyperlipidemia 07/17/2013  . Pancreatic insufficiency 07/17/2013  . Chronic postoperative pain 01/01/2013  . Long term current use of opiate analgesic 11/04/2012  . Hypothyroidism 08/25/2012  . Radiculopathy of leg 05/01/2011   Shelton Silvas PT, DPT Shelton Silvas 12/14/2018, 11:10 AM  [facility] [address] [place], [address] Phone: [phone]   Fax:  [phone]  Name: Colleen Payne MRN: [id number] Date of Birth: [birth year]

## 2018-12-21 ENCOUNTER — Ambulatory Visit: Payer: Medicare Other | Admitting: Physical Therapy

## 2018-12-21 ENCOUNTER — Encounter: Payer: Self-pay | Admitting: Physical Therapy

## 2018-12-21 ENCOUNTER — Other Ambulatory Visit: Payer: Self-pay

## 2018-12-21 DIAGNOSIS — M5416 Radiculopathy, lumbar region: Secondary | ICD-10-CM

## 2018-12-21 DIAGNOSIS — M4126 Other idiopathic scoliosis, lumbar region: Secondary | ICD-10-CM

## 2018-12-21 NOTE — Therapy (Signed)
[facility] [address] [place], [address] Phone: [phone]   Fax:  [phone]  Physical Therapy Treatment  Patient Details  Name: Colleen Payne MRN: [id number] Date of Birth: 30-Apr-1950 Referring Provider (PT): Traci Sermon MD   Encounter Date: 12/21/2018  PT End of Session - 12/21/18 1034    Visit Number  42    Number of Visits  46    Date for PT Re-Evaluation  02/01/19    Authorization Type  Medicare    Authorization Time Period  3/10    PT Start Time  1030    PT Stop Time  1113    PT Time Calculation (min)  43 min    Activity Tolerance  Patient tolerated treatment well;No increased pain    Behavior During Therapy  [facility] for tasks assessed/performed;Flat affect       Past Medical History:  Diagnosis Date  . Anxiety   . Atrial fibrillation ([place])   . Depression   . Diabetes mellitus without complication (HCC)    Type I   . Headache    stress. 1x/month  . Hypertension   . Insulin pump in place   . Motion sickness    cars  . MVP (mitral valve prolapse)   . Neuromuscular disorder (HCC)    leg weakness s/p back surgeries  . Thyroid disease     Past Surgical History:  Procedure Laterality Date  . BACK SURGERY  2004   3 sugeries between Sept and Nov, fusion L2-S1  . CATARACT EXTRACTION W/PHACO Left 11/23/2014   Procedure: CATARACT EXTRACTION PHACO AND INTRAOCULAR LENS PLACEMENT (IOC);  Surgeon: Leandrew Koyanagi, MD;  Location: [facility];  Service: Ophthalmology;  Laterality: Left;  DIABETIC - insulin pump    There were no vitals filed for this visit.  Subjective Assessment - 12/21/18 1031    Subjective  Reports 7/10 LBP this am, continued L shoulder pain with overhead motion, that she still has not been seen for it. Reports she is still not sleeping well, waking up multiple times to use the restroom    Pertinent History  Patient is 68 yo female that complains of  lumbar pain and inability to walk erect, as well as hip/abdominal pain. Patient with history of lumbar fusion (L2-S1). Xray results show: Fairly stable appearance of the levoscoliosis of the lower thoracic and of the lumbar spine.Stable lateral subluxation toward the left of L2 with respect L3. Stable partial compression of the body of L1. Patient complains of frustration with physicians, and that her symptoms are worsening over time. 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 Reported that her pain and difficulty worsened after trying to pick her husband up about 1 year ago. Patient mentioned that she has been told that she has "spinal fluid pockets that are leaking". PMH also includes DM I with insulin pump,  afib, HTN,  HLD, chronic pain syndrome, SOB.    Limitations  Standing;Walking;House hold activities;Other (comment)    Diagnostic tests  see exray results: Fairly stable appearance of the levoscoliosis of the lower thoracic and lumbar spine. Stable lateral subluxation toward left of L2 with respect of L3, stable partial compression of the body of L1.    Patient Stated Goals  pain relief, standing erect       INTERVENTION THIS DATE:  -Thomas Test Stretch, simultaneous bilateral 1x5 minutes -concurrent with above, BUE wand flexion 1x20x5secH; some  L shoulder pain with this, tolerable - Bird dog 2x 8 (each) with cuing for knee ext with hip ext, decent carry over; increased LOB with LUE and RLE Ardine Eng pose 46min between sets and after 2x 71min hold - Thoracic ext with bilat scapular retraction from theraball 2x 10  - Half kneeling BUE wand flex with emphasis on thoracic ext with hip ext/ant translation 2x 5 increased difficulty in R 1/2 kneeling requiring modA to maintian balance; minA in L 1/2 kneeling - Squat with 9.5# box 2x 10 with cuing for upright posture maintenance. Difficulty with ant wt distribution maintaining, decent carry over with cuing - Leg  swings ant/post with unilateral UE support 2x 10 with cuing for thoracic ext with hip ext momentum, decent carry over - Stair hip flexor stretch 3x 10sec holds each some TC needed for increased ROM for stretch                         PT Education - 12/21/18 1032    Education Details  therex form    Person(s) Educated  Patient    Methods  Explanation;Demonstration;Verbal cues    Comprehension  Verbalized understanding;Returned demonstration;Verbal cues required       PT Short Term Goals - 07/27/18 0933      PT SHORT TERM GOAL #1   Title  Patient will be adherent to initial HEP at least 3x a week to improve functional strength and balance for better safety at home.    Time  4    Period  Weeks    Status  Achieved    Target Date  12/17/17        PT Long Term Goals - 12/07/18 1028      PT LONG TERM GOAL #1   Title  Patient will be independent in bending down towards floor and picking up small object (<5 pounds) and then stand back up without increased pain to improve ability to pick up and clean up room at home    Baseline  09/25/18/20 able to pick up object without increase in pain, though she reports continued baseline pain, goal revised and achieved    Time  8    Period  Weeks    Status  Achieved      PT LONG TERM GOAL #2   Title  Patient will increase BLE gross strength to 4+/5 as to improve functional strength for independent gait, increased standing tolerance and increased ADL ability.    Baseline  08/31/18 R/L flexion: 5/5 ER 5/5 IR 4+/4+ ABD 5/5 Ext 4+/4    Time  8    Period  Weeks    Status  Achieved      PT LONG TERM GOAL #3   Title  Patient will report a worst pain of 5/10 with transfers of her husband to improve tolerance with ADLs     Baseline  12/07/18 10/10 pain with transfers    Time  8    Period  Weeks    Status  On-going      PT LONG TERM GOAL #4   Title  Pt will decrease 5TSTS to 10sec order to demonstrate clinically significant  improvement in LE strength, and reduced fall risk    Baseline  12/07/18 17sec 10/12/18 10.1sec 09/25/18 11sec 08/31/18 12sec 07/31/18 13sec 07/20/18 15sec 06/10/18 16sec    Time  8    Period  Weeks    Status  On-going  PT LONG TERM GOAL #5   Title  Pt will increase 10MWT by at least 0.13 m/s in order to demonstrate clinically significant improvement in community ambulation    Baseline  09/25/18 0.83 07/31/18 0.68m/s 07/20/18 same 06/10/18 0.32m/s    Time  8    Period  Weeks    Status  Achieved      PT LONG TERM GOAL #6   Title  Patient will demonstrate walk speed of 1.2 to demonstrate community ambulation norm    Baseline  12/07/18 0.69m/s 10/12/18 0.90m/s 09/25/18 0.66m/s    Time  8    Period  Weeks    Status  On-going      PT LONG TERM GOAL #7   Title  Patient will increase standing tolerance to 55min without increased pain in order to complete household chores/cooking    Baseline  12/07/18 Can stand only stand for 15 mins d/t decreased tolerance    Time  8    Period  Weeks    Status  On-going      PT LONG TERM GOAL #8   Title  Patient will demonstrate SLS of 10sec to demonstrate decreased fall risk    Baseline  12/07/18 RLE 4sec LLE 4.2sec 11/19/18 RLE 4sec LLE 6sec    Time  8    Period  Weeks    Status  On-going            Plan - 12/21/18 1100    Clinical Impression Statement  PT continued progression for extension activation strenghening, core stability, and functional tasks with good success. Patient motivated throughout session and able to complete all therex following cuing with planned rest breaks. PT will continue progression as able.    Stability/Clinical Decision Making  Evolving/Moderate complexity    Clinical Decision Making  Moderate    Rehab Potential  Fair    Clinical Impairments Affecting Rehab Potential  comorbidities, symptom duration, curvature degree    PT Frequency  2x / week    PT Duration  8 weeks    PT Treatment/Interventions  ADLs/Self Care Home  Management;Aquatic Therapy;Cryotherapy;Ultrasound;Parrafin;Traction;Moist Heat;Electrical Stimulation;DME Instruction;Gait training;Stair training;Functional mobility training;Neuromuscular re-education;Balance training;Therapeutic exercise;Therapeutic activities;Patient/family education;Energy conservation;Spinal Manipulations;Joint Manipulations;Passive range of motion;Dry needling;Manual techniques    PT Next Visit Plan  Progress Hip and core strengthening as tolerated    PT Home Exercise Plan  Added standing pec stretch in doorway to HEP today    Consulted and Agree with Plan of Care  Patient       Patient will benefit from skilled therapeutic intervention in order to improve the following deficits and impairments:  Abnormal gait, Decreased balance, Decreased endurance, Decreased mobility, Difficulty walking, Hypomobility, Increased muscle spasms, Decreased range of motion, Pain, Postural dysfunction, Impaired flexibility, Increased fascial restricitons, Decreased strength, Decreased activity tolerance  Visit Diagnosis: Other idiopathic scoliosis, lumbar region  Radiculopathy, lumbar region     Problem List Patient Active Problem List   Diagnosis Date Noted  . Central sleep apnea 10/09/2016  . Hypertension 10/09/2016  . Lumbar postlaminectomy syndrome 10/09/2016  . Lumbar radiculopathy 10/09/2016  . Diabetic polyneuropathy ([place]) 10/07/2016  . Right lumbosacral radiculopathy 10/07/2016  . Healthcare maintenance 08/26/2016  . DKA (diabetic ketoacidoses) ([place]) 06/14/2016  . Atypical chest pain 02/23/2016  . SOB (shortness of breath) 02/23/2016  . Pain medication agreement signed 01/13/2015  . Acquired hypothyroidism 05/30/2014  . Frontal sinusitis 12/17/2013  . Atrial fibrillation ([place]) 11/29/2013  . Major depression in remission ([place]) 11/20/2013  . 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 Chronic pain syndrome 08/27/2013  . Back pain  07/17/2013  . HTN (hypertension), benign 07/17/2013  . Hyperlipidemia 07/17/2013  . Pancreatic insufficiency 07/17/2013  . Chronic postoperative pain 01/01/2013  . Long term current use of opiate analgesic 11/04/2012  . Hypothyroidism 08/25/2012  . Radiculopathy of leg 05/01/2011   Shelton Silvas PT, DPT Shelton Silvas 12/21/2018, 11:07 AM  [facility] [address], [place], [address] Phone: [phone]   Fax:  [phone]  Name: Colleen Payne MRN: [id number] Date of Birth: 06-Apr-1950

## 2018-12-25 ENCOUNTER — Ambulatory Visit: Payer: Medicare Other | Attending: Anesthesiology | Admitting: Physical Therapy

## 2018-12-25 ENCOUNTER — Encounter: Payer: Self-pay | Admitting: Physical Therapy

## 2018-12-25 ENCOUNTER — Other Ambulatory Visit: Payer: Self-pay

## 2018-12-25 DIAGNOSIS — M4126 Other idiopathic scoliosis, lumbar region: Secondary | ICD-10-CM

## 2018-12-25 DIAGNOSIS — M5416 Radiculopathy, lumbar region: Secondary | ICD-10-CM | POA: Insufficient documentation

## 2018-12-25 NOTE — Therapy (Signed)
[facility] [address], [place], [address] Phone: [phone]   Fax:  [phone]  Physical Therapy Treatment  Patient Details  Name: Colleen Payne MRN: [id number] Date of Birth: [birth year] Referring Provider (PT): Traci Sermon MD   Encounter Date: 12/25/2018  PT End of Session - 12/25/18 1011    Visit Number  43    Number of Visits  99    Date for PT Re-Evaluation  02/01/19    Authorization Type  Medicare    Authorization Time Period  4/10    PT Start Time  1000    PT Stop Time  1045    PT Time Calculation (min)  45 min    Activity Tolerance  Patient tolerated treatment well    Behavior During Therapy  [facility] for tasks assessed/performed;Flat affect       Past Medical History:  Diagnosis Date  . Anxiety   . Atrial fibrillation ([place])   . Depression   . Diabetes mellitus without complication (HCC)    Type I   . Headache    stress. 1x/month  . Hypertension   . Insulin pump in place   . Motion sickness    cars  . MVP (mitral valve prolapse)   . Neuromuscular disorder (HCC)    leg weakness s/p back surgeries  . Thyroid disease     Past Surgical History:  Procedure Laterality Date  . BACK SURGERY  2004   3 sugeries between Sept and Nov, fusion L2-S1  . CATARACT EXTRACTION W/PHACO Left 11/23/2014   Procedure: CATARACT EXTRACTION PHACO AND INTRAOCULAR LENS PLACEMENT (IOC);  Surgeon: Leandrew Koyanagi, MD;  Location: [facility];  Service: Ophthalmology;  Laterality: Left;  DIABETIC - insulin pump    There were no vitals filed for this visit.  Subjective Assessment - 12/25/18 1008    Subjective  Reports LBP 8/10 pain today, reports weather is making her pain worse.    Pertinent History  Patient is 68 yo female that complains of lumbar pain and inability to walk erect, as well as hip/abdominal pain. Patient with history of lumbar fusion (L2-S1). Xray results show: Fairly stable  appearance of the levoscoliosis of the lower thoracic and of the lumbar spine.Stable lateral subluxation toward the left of L2 with respect L3. Stable partial compression of the body of L1. Patient complains of frustration with physicians, and that her symptoms are worsening over time. 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 Reported that her pain and difficulty worsened after trying to pick her husband up about 1 year ago. Patient mentioned that she has been told that she has "spinal fluid pockets that are leaking". PMH also includes DM I with insulin pump,  afib, HTN,  HLD, chronic pain syndrome, SOB.    Limitations  Standing;Walking;House hold activities;Other (comment)    Diagnostic tests  see exray results: Fairly stable appearance of the levoscoliosis of the lower thoracic and lumbar spine. Stable lateral subluxation toward left of L2 with respect of L3, stable partial compression of the body of L1.    Patient Stated Goals  pain relief, standing erect    Pain Onset  More than a month ago       Ther-Ex  -[employer], simultaneous bilateral 1x5 minutes -concurrent with above, BUE wand flexion 1x20x5secH; some L shoulder pain with this, tolerable - Bird dog 3x 8 (each) with cuing for knee ext  with hip ext, decent carry over; increased LOB with LUE and RLE Ardine Eng pose 93min between sets 3x all together Stair hip flexor stretch 2x 30sec hold each side with guarding for safety/ TC proper positioning - standing CL hip swings ant/post 2x 10 bilat with cuing for "belly button forward with hip ext" with decent carry over - Overhead squat 2x 10 with good form following min cuing/correction - Squat with 5# DB in BUE 2x 10 with decent carry over of postural positioning, cuing to maintain upright posture through lowering                       PT Education - 12/25/18 1009    Education Details  therex form    Person(s) Educated   Patient    Methods  Explanation;Demonstration;Tactile cues;Verbal cues    Comprehension  Verbalized understanding;Returned demonstration;Verbal cues required;Tactile cues required       PT Short Term Goals - 07/27/18 0933      PT SHORT TERM GOAL #1   Title  Patient will be adherent to initial HEP at least 3x a week to improve functional strength and balance for better safety at home.    Time  4    Period  Weeks    Status  Achieved    Target Date  12/17/17        PT Long Term Goals - 12/07/18 1028      PT LONG TERM GOAL #1   Title  Patient will be independent in bending down towards floor and picking up small object (<5 pounds) and then stand back up without increased pain to improve ability to pick up and clean up room at home    Baseline  09/25/18/20 able to pick up object without increase in pain, though she reports continued baseline pain, goal revised and achieved    Time  8    Period  Weeks    Status  Achieved      PT LONG TERM GOAL #2   Title  Patient will increase BLE gross strength to 4+/5 as to improve functional strength for independent gait, increased standing tolerance and increased ADL ability.    Baseline  08/31/18 R/L flexion: 5/5 ER 5/5 IR 4+/4+ ABD 5/5 Ext 4+/4    Time  8    Period  Weeks    Status  Achieved      PT LONG TERM GOAL #3   Title  Patient will report a worst pain of 5/10 with transfers of her husband to improve tolerance with ADLs     Baseline  12/07/18 10/10 pain with transfers    Time  8    Period  Weeks    Status  On-going      PT LONG TERM GOAL #4   Title  Pt will decrease 5TSTS to 10sec order to demonstrate clinically significant improvement in LE strength, and reduced fall risk    Baseline  12/07/18 17sec 10/12/18 10.1sec 09/25/18 11sec 08/31/18 12sec 07/31/18 13sec 07/20/18 15sec 06/10/18 16sec    Time  8    Period  Weeks    Status  On-going      PT LONG TERM GOAL #5   Title  Pt will increase 10MWT by at least 0.13 m/s in order to demonstrate  clinically significant improvement in community ambulation    Baseline  09/25/18 0.83 07/31/18 0.57m/s 07/20/18 same 06/10/18 0.34m/s    Time  8    Period  [place]  Status  Achieved      PT LONG TERM GOAL #6   Title  Patient will demonstrate walk speed of 1.2 to demonstrate community ambulation norm    Baseline  12/07/18 0.64m/s 10/12/18 0.50m/s 09/25/18 0.3m/s    Time  8    Period  Weeks    Status  On-going      PT LONG TERM GOAL #7   Title  Patient will increase standing tolerance to 76min without increased pain in order to complete household chores/cooking    Baseline  12/07/18 Can stand only stand for 15 mins d/t decreased tolerance    Time  8    Period  Weeks    Status  On-going      PT LONG TERM GOAL #8   Title  Patient will demonstrate SLS of 10sec to demonstrate decreased fall risk    Baseline  12/07/18 RLE 4sec LLE 4.2sec 11/19/18 RLE 4sec LLE 6sec    Time  8    Period  Weeks    Status  On-going            Plan - 12/25/18 1019    Clinical Impression Statement  PT continued therex progression for increased postural and extension strength with carry over into functional movements with good success. Patient motivated throughout session, with some increased fatigue following poor sleep hygiene, but ultimately able to complete all therex with cuing for technique, with planned rests. PT will continue progression as able.    Stability/Clinical Decision Making  Evolving/Moderate complexity    Clinical Decision Making  Moderate    Rehab Potential  Fair    Clinical Impairments Affecting Rehab Potential  comorbidities, symptom duration, curvature degree    PT Frequency  2x / week    PT Duration  8 weeks    PT Treatment/Interventions  ADLs/Self Care Home Management;Aquatic Therapy;Cryotherapy;Ultrasound;Parrafin;Traction;Moist Heat;Electrical Stimulation;DME Instruction;Gait training;Stair training;Functional mobility training;Neuromuscular re-education;Balance training;Therapeutic  exercise;Therapeutic activities;Patient/family education;Energy conservation;Spinal Manipulations;Joint Manipulations;Passive range of motion;Dry needling;Manual techniques    PT Next Visit Plan  Progress Hip and core strengthening as tolerated    PT Home Exercise Plan  Added standing pec stretch in doorway to HEP today    Consulted and Agree with Plan of Care  Patient       Patient will benefit from skilled therapeutic intervention in order to improve the following deficits and impairments:  Abnormal gait, Decreased balance, Decreased endurance, Decreased mobility, Difficulty walking, Hypomobility, Increased muscle spasms, Decreased range of motion, Pain, Postural dysfunction, Impaired flexibility, Increased fascial restricitons, Decreased strength, Decreased activity tolerance  Visit Diagnosis: Other idiopathic scoliosis, lumbar region  Radiculopathy, lumbar region     Problem List Patient Active Problem List   Diagnosis Date Noted  . Central sleep apnea 10/09/2016  . Hypertension 10/09/2016  . Lumbar postlaminectomy syndrome 10/09/2016  . Lumbar radiculopathy 10/09/2016  . Diabetic polyneuropathy ([place]) 10/07/2016  . Right lumbosacral radiculopathy 10/07/2016  . Healthcare maintenance 08/26/2016  . DKA (diabetic ketoacidoses) ([place]) 06/14/2016  . Atypical chest pain 02/23/2016  . SOB (shortness of breath) 02/23/2016  . Pain medication agreement signed 01/13/2015  . Acquired hypothyroidism 05/30/2014  . Frontal sinusitis 12/17/2013  . Atrial fibrillation ([place]) 11/29/2013  . Major depression in remission ([place]) 11/20/2013  . Type 1 diabetes mellitus with stage 2 chronic kidney disease ([place]) 10/26/2013  . Chronic pain syndrome 08/27/2013  . Back pain 07/17/2013  . HTN (hypertension), benign 07/17/2013  . Hyperlipidemia 07/17/2013  . Pancreatic insufficiency 07/17/2013  . Chronic postoperative pain 01/01/2013  .  Long term current use of opiate analgesic 11/04/2012  .  Hypothyroidism 08/25/2012  . Radiculopathy of leg 05/01/2011   Shelton Silvas PT, DPT Shelton Silvas 12/25/2018, 10:42 AM  [facility] [address] [place], [address] Phone: [phone]   Fax:  [phone]  Name: YAILIN SWEAZY MRN: [id number] Date of Birth: Sep 25, 1950

## 2018-12-28 ENCOUNTER — Ambulatory Visit: Payer: Medicare Other | Admitting: Physical Therapy

## 2019-01-01 ENCOUNTER — Ambulatory Visit: Payer: Medicare Other | Admitting: Physical Therapy

## 2019-01-01 ENCOUNTER — Other Ambulatory Visit: Payer: Self-pay

## 2019-01-01 ENCOUNTER — Encounter: Payer: Self-pay | Admitting: Physical Therapy

## 2019-01-01 DIAGNOSIS — M4126 Other idiopathic scoliosis, lumbar region: Secondary | ICD-10-CM | POA: Diagnosis not present

## 2019-01-01 DIAGNOSIS — M5416 Radiculopathy, lumbar region: Secondary | ICD-10-CM

## 2019-01-01 NOTE — Therapy (Signed)
[facility] [address], [place], [address] Phone: [phone]   Fax:  [phone]  Physical Therapy Treatment  Patient Details  Name: Colleen Payne MRN: [id number] Date of Birth: 04/06/1950 Referring Provider (PT): Traci Sermon MD   Encounter Date: 01/01/2019  PT End of Session - 01/01/19 1008    Visit Number  44    Number of Visits  70    Date for PT Re-Evaluation  02/01/19    Authorization Type  Medicare    Authorization Time Period  5/10    PT Start Time  1002    PT Stop Time  1042    PT Time Calculation (min)  40 min    Activity Tolerance  Patient tolerated treatment well    Behavior During Therapy  [facility] for tasks assessed/performed;Flat affect       Past Medical History:  Diagnosis Date  . Anxiety   . Atrial fibrillation ([place])   . Depression   . Diabetes mellitus without complication (HCC)    Type I   . Headache    stress. 1x/month  . Hypertension   . Insulin pump in place   . Motion sickness    cars  . MVP (mitral valve prolapse)   . Neuromuscular disorder (HCC)    leg weakness s/p back surgeries  . Thyroid disease     Past Surgical History:  Procedure Laterality Date  . BACK SURGERY  2004   3 sugeries between Sept and Nov, fusion L2-S1  . CATARACT EXTRACTION W/PHACO Left 11/23/2014   Procedure: CATARACT EXTRACTION PHACO AND INTRAOCULAR LENS PLACEMENT (IOC);  Surgeon: Leandrew Koyanagi, MD;  Location: [place];  Service: Ophthalmology;  Laterality: Left;  DIABETIC - insulin pump    There were no vitals filed for this visit.  Subjective Assessment - 01/01/19 1003    Subjective  Continues LBP, has some tailbone pain that started a few days go without mechanism. Reports even though she is continuign to have trouble standing upright, that she is having less lateral lean which she encouraged by    Pertinent History  Patient is 68 yo female that complains of  lumbar pain and inability to walk erect, as well as hip/abdominal pain. Patient with history of lumbar fusion (L2-S1). Xray results show: Fairly stable appearance of the levoscoliosis of the lower thoracic and of the lumbar spine.Stable lateral subluxation toward the left of L2 with respect L3. Stable partial compression of the body of L1. Patient complains of frustration with physicians, and that her symptoms are worsening over time. 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 Reported that her pain and difficulty worsened after trying to pick her husband up about 1 year ago. Patient mentioned that she has been told that she has "spinal fluid pockets that are leaking". PMH also includes DM I with insulin pump,  afib, HTN,  HLD, chronic pain syndrome, SOB.    Limitations  Standing;Walking;House hold activities;Other (comment)    Diagnostic tests  see exray results: Fairly stable appearance of the levoscoliosis of the lower thoracic and lumbar spine. Stable lateral subluxation toward left of L2 with respect of L3, stable partial compression of the body of L1.    Patient Stated Goals  pain relief, standing erect       Ther-Ex  -Thomas Test Stretch, simultaneous bilateral 1x5 minutes -concurrent with above, BUE wand flexion 1x20x5secH; some L shoulder pain with  this, tolerable - Stair flexion stretch 2x 30sec; attempted with UE up and overhead, able with LUE but with RLE unable d/t back pain - standing CL hip swings ant/post 2x 15 bilat with cuing for "belly button forward with hip ext" with decent carry over - Backward walking with focus on hip ext bilat HHA 2x 162ft  - Lower trunk rotations 3-5sec  hooklying with cuing to maintain back contact with mat with good carry over - Hooklying bent knee fallouts x20 5sec hold PT assistance initially to aid in relaxation, good carry over - [facility] x30sec each; 54min each LE with opposite LE extended - Wide childs pose stretch  bilat LE abd 2x 39min hols                         PT Education - 01/01/19 1008    Education Details  therex form    Person(s) Educated  Patient    Methods  Explanation;Demonstration;Verbal cues    Comprehension  Verbalized understanding;Returned demonstration;Verbal cues required       PT Short Term Goals - 07/27/18 0933      PT SHORT TERM GOAL #1   Title  Patient will be adherent to initial HEP at least 3x a week to improve functional strength and balance for better safety at home.    Time  4    Period  Weeks    Status  Achieved    Target Date  12/17/17        PT Long Term Goals - 12/07/18 1028      PT LONG TERM GOAL #1   Title  Patient will be independent in bending down towards floor and picking up small object (<5 pounds) and then stand back up without increased pain to improve ability to pick up and clean up room at home    Baseline  09/25/18/20 able to pick up object without increase in pain, though she reports continued baseline pain, goal revised and achieved    Time  8    Period  Weeks    Status  Achieved      PT LONG TERM GOAL #2   Title  Patient will increase BLE gross strength to 4+/5 as to improve functional strength for independent gait, increased standing tolerance and increased ADL ability.    Baseline  08/31/18 R/L flexion: 5/5 ER 5/5 IR 4+/4+ ABD 5/5 Ext 4+/4    Time  8    Period  Weeks    Status  Achieved      PT LONG TERM GOAL #3   Title  Patient will report a worst pain of 5/10 with transfers of her husband to improve tolerance with ADLs     Baseline  12/07/18 10/10 pain with transfers    Time  8    Period  Weeks    Status  On-going      PT LONG TERM GOAL #4   Title  Pt will decrease 5TSTS to 10sec order to demonstrate clinically significant improvement in LE strength, and reduced fall risk    Baseline  12/07/18 17sec 10/12/18 10.1sec 09/25/18 11sec 08/31/18 12sec 07/31/18 13sec 07/20/18 15sec 06/10/18 16sec    Time  8    Period   Weeks    Status  On-going      PT LONG TERM GOAL #5   Title  Pt will increase 10MWT by at least 0.13 m/s in order to demonstrate clinically significant improvement in community ambulation  Baseline  09/25/18 0.83 07/31/18 0.39m/s 07/20/18 same 06/10/18 0.34m/s    Time  8    Period  Weeks    Status  Achieved      PT LONG TERM GOAL #6   Title  Patient will demonstrate walk speed of 1.2 to demonstrate community ambulation norm    Baseline  12/07/18 0.92m/s 10/12/18 0.34m/s 09/25/18 0.63m/s    Time  8    Period  Weeks    Status  On-going      PT LONG TERM GOAL #7   Title  Patient will increase standing tolerance to 40min without increased pain in order to complete household chores/cooking    Baseline  12/07/18 Can stand only stand for 15 mins d/t decreased tolerance    Time  8    Period  Weeks    Status  On-going      PT LONG TERM GOAL #8   Title  Patient will demonstrate SLS of 10sec to demonstrate decreased fall risk    Baseline  12/07/18 RLE 4sec LLE 4.2sec 11/19/18 RLE 4sec LLE 6sec    Time  8    Period  Weeks    Status  On-going            Plan - 01/01/19 1026    Clinical Impression Statement  Patient subjectively reports that she is having less lateral displacement, PT attributes to increased core strength and stability. PT focused on continued mobility restrictions with increased focus on sacral/coccyx pain this session with good relief. Patine treports pain decreased by 50% following session. PT will continue progression for extensor posture as able and address mobility resistrctions as appropriate.    Stability/Clinical Decision Making  Evolving/Moderate complexity    Clinical Decision Making  Moderate    Rehab Potential  Fair    Clinical Impairments Affecting Rehab Potential  comorbidities, symptom duration, curvature degree    PT Frequency  2x / week    PT Duration  8 weeks    PT Treatment/Interventions  ADLs/Self Care Home Management;Aquatic  Therapy;Cryotherapy;Ultrasound;Parrafin;Traction;Moist Heat;Electrical Stimulation;DME Instruction;Gait training;Stair training;Functional mobility training;Neuromuscular re-education;Balance training;Therapeutic exercise;Therapeutic activities;Patient/family education;Energy conservation;Spinal Manipulations;Joint Manipulations;Passive range of motion;Dry needling;Manual techniques    PT Next Visit Plan  Progress Hip and core strengthening as tolerated    PT Home Exercise Plan  Added standing pec stretch in doorway to HEP today    Consulted and Agree with Plan of Care  Patient       Patient will benefit from skilled therapeutic intervention in order to improve the following deficits and impairments:  Abnormal gait, Decreased balance, Decreased endurance, Decreased mobility, Difficulty walking, Hypomobility, Increased muscle spasms, Decreased range of motion, Pain, Postural dysfunction, Impaired flexibility, Increased fascial restricitons, Decreased strength, Decreased activity tolerance  Visit Diagnosis: Other idiopathic scoliosis, lumbar region  Radiculopathy, lumbar region     Problem List Patient Active Problem List   Diagnosis Date Noted  . Central sleep apnea 10/09/2016  . Hypertension 10/09/2016  . Lumbar postlaminectomy syndrome 10/09/2016  . Lumbar radiculopathy 10/09/2016  . Diabetic polyneuropathy ([place]) 10/07/2016  . Right lumbosacral radiculopathy 10/07/2016  . Healthcare maintenance 08/26/2016  . DKA (diabetic ketoacidoses) ([place]) 06/14/2016  . Atypical chest pain 02/23/2016  . SOB (shortness of breath) 02/23/2016  . Pain medication agreement signed 01/13/2015  . Acquired hypothyroidism 05/30/2014  . Frontal sinusitis 12/17/2013  . Atrial fibrillation ([place]) 11/29/2013  . Major depression in remission ([place]) 11/20/2013  . 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  Chronic pain syndrome 08/27/2013  . Back pain 07/17/2013  . HTN  (hypertension), benign 07/17/2013  . Hyperlipidemia 07/17/2013  . Pancreatic insufficiency 07/17/2013  . Chronic postoperative pain 01/01/2013  . Long term current use of opiate analgesic 11/04/2012  . Hypothyroidism 08/25/2012  . Radiculopathy of leg 05/01/2011   Shelton Silvas PT, DPT Shelton Silvas 01/01/2019, 10:37 AM  [facility] [address] [place], [address] Phone: [phone]   Fax:  [phone]  Name: Colleen Payne MRN: [id number] Date of Birth: Dec 04, 1950

## 2019-01-04 ENCOUNTER — Other Ambulatory Visit: Payer: Self-pay

## 2019-01-04 ENCOUNTER — Ambulatory Visit: Payer: Medicare Other | Admitting: Physical Therapy

## 2019-01-04 ENCOUNTER — Encounter: Payer: Self-pay | Admitting: Physical Therapy

## 2019-01-04 DIAGNOSIS — M4126 Other idiopathic scoliosis, lumbar region: Secondary | ICD-10-CM | POA: Diagnosis not present

## 2019-01-04 DIAGNOSIS — M5416 Radiculopathy, lumbar region: Secondary | ICD-10-CM

## 2019-01-04 NOTE — Therapy (Signed)
[facility] [address], [place], [address] Phone: [phone]   Fax:  [phone]  Physical Therapy Treatment  Patient Details  Name: Colleen Payne MRN: [id number] Date of Birth: [birth year] Referring Provider (PT): Traci Sermon MD   Encounter Date: 01/04/2019  PT End of Session - 01/04/19 1022    Visit Number  45    Number of Visits  42    Date for PT Re-Evaluation  02/01/19    Authorization Type  Medicare    Authorization Time Period  6/10    PT Start Time  1015    PT Stop Time  1057    PT Time Calculation (min)  42 min    Activity Tolerance  Patient tolerated treatment well    Behavior During Therapy  [facility] for tasks assessed/performed;Flat affect       Past Medical History:  Diagnosis Date  . Anxiety   . Atrial fibrillation ([place])   . Depression   . Diabetes mellitus without complication (HCC)    Type I   . Headache    stress. 1x/month  . Hypertension   . Insulin pump in place   . Motion sickness    cars  . MVP (mitral valve prolapse)   . Neuromuscular disorder (HCC)    leg weakness s/p back surgeries  . Thyroid disease     Past Surgical History:  Procedure Laterality Date  . BACK SURGERY  2004   3 sugeries between Sept and Nov, fusion L2-S1  . CATARACT EXTRACTION W/PHACO Left 11/23/2014   Procedure: CATARACT EXTRACTION PHACO AND INTRAOCULAR LENS PLACEMENT (IOC);  Surgeon: Leandrew Koyanagi, MD;  Location: [facility];  Service: Ophthalmology;  Laterality: Left;  DIABETIC - insulin pump    There were no vitals filed for this visit.  Subjective Assessment - 01/04/19 1017    Subjective  Patient reports her tailbone was sore Saturday, then felt better. Pt reports LBP is worsened today, 10/10 pain.    Pertinent History  Patient is 68 yo female that complains of lumbar pain and inability to walk erect, as well as hip/abdominal pain. Patient with history of lumbar fusion  (L2-S1). Xray results show: Fairly stable appearance of the levoscoliosis of the lower thoracic and of the lumbar spine.Stable lateral subluxation toward the left of L2 with respect L3. Stable partial compression of the body of L1. Patient complains of frustration with physicians, and that her symptoms are worsening over time. 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 Reported that her pain and difficulty worsened after trying to pick her husband up about 1 year ago. Patient mentioned that she has been told that she has "spinal fluid pockets that are leaking". PMH also includes DM I with insulin pump,  afib, HTN,  HLD, chronic pain syndrome, SOB.    Limitations  Standing;Walking;House hold activities;Other (comment)    Diagnostic tests  see exray results: Fairly stable appearance of the levoscoliosis of the lower thoracic and lumbar spine. Stable lateral subluxation toward left of L2 with respect of L3, stable partial compression of the body of L1.    Patient Stated Goals  pain relief, standing erect    Pain Onset  More than a month ago    Pain Onset  More than a month ago         Ther-Ex  -[employer], simultaneous bilateral 1x5 minutes -concurrent with above, BUE wand flexion  1x20x5secH; some L shoulder pain with this, tolerable - Lower trunk rotations x205sec  hooklying with cuing to maintain back contact with mat with good carry over - Hooklying bent knee fallouts x15 5sec hold; 1 hold for 17min PT assistance initially to aid in relaxation, good carry over - SKTC x30sec each LE; 30min each LE with opposite LE extended - Seated on theraball long lever trunk rotations with 3# DB x10 each side; 5# 2x 10 each side with demo and heavy cuing for lumbar and tspine rotation without increased pelvic rotation and for posture with good carry over - Seated on theraball alt marching 3x 10 with min cuing initially for posture and core contraction with good  carry over; on 2nd and 3rd set needed TC for initiation with good carry over - Backward walking on treadmill 23mins 0.28mph > 0.25mph for increased hip ext endurance; consistent cuing for increased hip ext with upright posture. Good ability to ext hip slightly beyond neutral with cuing - Stair hip flexion stretch 39min each LE                       PT Education - 01/04/19 1022    Education Details  therex form    Person(s) Educated  Patient    Methods  Explanation;Demonstration;Verbal cues    Comprehension  Verbalized understanding;Returned demonstration;Verbal cues required       PT Short Term Goals - 07/27/18 0933      PT SHORT TERM GOAL #1   Title  Patient will be adherent to initial HEP at least 3x a week to improve functional strength and balance for better safety at home.    Time  4    Period  Weeks    Status  Achieved    Target Date  12/17/17        PT Long Term Goals - 12/07/18 1028      PT LONG TERM GOAL #1   Title  Patient will be independent in bending down towards floor and picking up small object (<5 pounds) and then stand back up without increased pain to improve ability to pick up and clean up room at home    Baseline  09/25/18/20 able to pick up object without increase in pain, though she reports continued baseline pain, goal revised and achieved    Time  8    Period  Weeks    Status  Achieved      PT LONG TERM GOAL #2   Title  Patient will increase BLE gross strength to 4+/5 as to improve functional strength for independent gait, increased standing tolerance and increased ADL ability.    Baseline  08/31/18 R/L flexion: 5/5 ER 5/5 IR 4+/4+ ABD 5/5 Ext 4+/4    Time  8    Period  Weeks    Status  Achieved      PT LONG TERM GOAL #3   Title  Patient will report a worst pain of 5/10 with transfers of her husband to improve tolerance with ADLs     Baseline  12/07/18 10/10 pain with transfers    Time  8    Period  Weeks    Status  On-going      PT  LONG TERM GOAL #4   Title  Pt will decrease 5TSTS to 10sec order to demonstrate clinically significant improvement in LE strength, and reduced fall risk    Baseline  12/07/18 17sec 10/12/18 10.1sec 09/25/18 11sec 08/31/18 12sec 07/31/18 13sec  07/20/18 15sec 06/10/18 16sec    Time  8    Period  Weeks    Status  On-going      PT LONG TERM GOAL #5   Title  Pt will increase 10MWT by at least 0.13 m/s in order to demonstrate clinically significant improvement in community ambulation    Baseline  09/25/18 0.83 07/31/18 0.76m/s 07/20/18 same 06/10/18 0.38m/s    Time  8    Period  Weeks    Status  Achieved      PT LONG TERM GOAL #6   Title  Patient will demonstrate walk speed of 1.2 to demonstrate community ambulation norm    Baseline  12/07/18 0.8m/s 10/12/18 0.26m/s 09/25/18 0.34m/s    Time  8    Period  Weeks    Status  On-going      PT LONG TERM GOAL #7   Title  Patient will increase standing tolerance to 40min without increased pain in order to complete household chores/cooking    Baseline  12/07/18 Can stand only stand for 15 mins d/t decreased tolerance    Time  8    Period  Weeks    Status  On-going      PT LONG TERM GOAL #8   Title  Patient will demonstrate SLS of 10sec to demonstrate decreased fall risk    Baseline  12/07/18 RLE 4sec LLE 4.2sec 11/19/18 RLE 4sec LLE 6sec    Time  8    Period  Weeks    Status  On-going            Plan - 01/04/19 1054    Clinical Impression Statement  PT continued therex progression for increased ext posture and endurance with good success. Pain modulating stretching utilized with good success, patient reporting 6/10 pain following. Patinet is continuing to report decreased side pain and decreased lateral displacement, which is evident in gait. PT will continue postural progression as able.    Stability/Clinical Decision Making  Evolving/Moderate complexity    Clinical Decision Making  Moderate    Rehab Potential  Fair    Clinical Impairments  Affecting Rehab Potential  comorbidities, symptom duration, curvature degree    PT Frequency  2x / week    PT Duration  8 weeks    PT Treatment/Interventions  ADLs/Self Care Home Management;Aquatic Therapy;Cryotherapy;Ultrasound;Parrafin;Traction;Moist Heat;Electrical Stimulation;DME Instruction;Gait training;Stair training;Functional mobility training;Neuromuscular re-education;Balance training;Therapeutic exercise;Therapeutic activities;Patient/family education;Energy conservation;Spinal Manipulations;Joint Manipulations;Passive range of motion;Dry needling;Manual techniques    PT Next Visit Plan  Progress Hip and core strengthening as tolerated    PT Home Exercise Plan  Added standing pec stretch in doorway to HEP today    Consulted and Agree with Plan of Care  Patient       Patient will benefit from skilled therapeutic intervention in order to improve the following deficits and impairments:  Abnormal gait, Decreased balance, Decreased endurance, Decreased mobility, Difficulty walking, Hypomobility, Increased muscle spasms, Decreased range of motion, Pain, Postural dysfunction, Impaired flexibility, Increased fascial restricitons, Decreased strength, Decreased activity tolerance  Visit Diagnosis: Other idiopathic scoliosis, lumbar region  Radiculopathy, lumbar region     Problem List Patient Active Problem List   Diagnosis Date Noted  . Central sleep apnea 10/09/2016  . Hypertension 10/09/2016  . Lumbar postlaminectomy syndrome 10/09/2016  . Lumbar radiculopathy 10/09/2016  . Diabetic polyneuropathy ([place]) 10/07/2016  . Right lumbosacral radiculopathy 10/07/2016  . Healthcare maintenance 08/26/2016  . DKA (diabetic ketoacidoses) ([place]) 06/14/2016  . Atypical chest pain 02/23/2016  . SOB (  shortness of breath) 02/23/2016  . Pain medication agreement signed 01/13/2015  . Acquired hypothyroidism 05/30/2014  . Frontal sinusitis 12/17/2013  . Atrial fibrillation ([place]) 11/29/2013  .  Major depression in remission ([place]) 11/20/2013  . 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 Chronic pain syndrome 08/27/2013  . Back pain 07/17/2013  . HTN (hypertension), benign 07/17/2013  . Hyperlipidemia 07/17/2013  . Pancreatic insufficiency 07/17/2013  . Chronic postoperative pain 01/01/2013  . Long term current use of opiate analgesic 11/04/2012  . Hypothyroidism 08/25/2012  . Radiculopathy of leg 05/01/2011   Shelton Silvas PT, DPT Shelton Silvas 01/04/2019, 11:08 AM  [facility] [address] [place], [address] Phone: [phone]   Fax:  [phone]  Name: Colleen Payne MRN: [id number] Date of Birth: 20-Feb-1950

## 2019-01-08 ENCOUNTER — Ambulatory Visit: Payer: Medicare Other | Admitting: Physical Therapy

## 2019-01-08 ENCOUNTER — Other Ambulatory Visit: Payer: Self-pay

## 2019-01-08 ENCOUNTER — Encounter: Payer: Self-pay | Admitting: Physical Therapy

## 2019-01-08 DIAGNOSIS — M4126 Other idiopathic scoliosis, lumbar region: Secondary | ICD-10-CM

## 2019-01-08 DIAGNOSIS — M5416 Radiculopathy, lumbar region: Secondary | ICD-10-CM

## 2019-01-08 NOTE — Therapy (Signed)
[facility] [address] [place], [address] Phone: [phone]   Fax:  [phone]  Physical Therapy Treatment  Patient Details  Name: Colleen Payne MRN: [id number] Date of Birth: Oct 01, 1950 Referring Provider (PT): Traci Sermon MD   Encounter Date: 01/08/2019  PT End of Session - 01/08/19 1011    Visit Number  46    Number of Visits  47    Date for PT Re-Evaluation  02/01/19    Authorization Type  Medicare    Authorization Time Period  6/10    PT Start Time  1000    PT Stop Time  1045    PT Time Calculation (min)  45 min    Activity Tolerance  Patient tolerated treatment well    Behavior During Therapy  [facility] for tasks assessed/performed;Flat affect       Past Medical History:  Diagnosis Date  . Anxiety   . Atrial fibrillation ([place])   . Depression   . Diabetes mellitus without complication (HCC)    Type I   . Headache    stress. 1x/month  . Hypertension   . Insulin pump in place   . Motion sickness    cars  . MVP (mitral valve prolapse)   . Neuromuscular disorder (HCC)    leg weakness s/p back surgeries  . Thyroid disease     Past Surgical History:  Procedure Laterality Date  . BACK SURGERY  2004   3 sugeries between Sept and Nov, fusion L2-S1  . CATARACT EXTRACTION W/PHACO Left 11/23/2014   Procedure: CATARACT EXTRACTION PHACO AND INTRAOCULAR LENS PLACEMENT (IOC);  Surgeon: Leandrew Koyanagi, MD;  Location: [facility];  Service: Ophthalmology;  Laterality: Left;  DIABETIC - insulin pump    There were no vitals filed for this visit.  Subjective Assessment - 01/08/19 1007    Subjective  Patient  reports her tailbone feels better, LBP is still feeling exacerbated, reports 8/10. Reports her L shoulder pain has been worse this week and she has not been able to move it well. PT continues to advise patient to seek medical attention for this for diagnosis.    Pertinent  History  Patient is 68 yo female that complains of lumbar pain and inability to walk erect, as well as hip/abdominal pain. Patient with history of lumbar fusion (L2-S1). Xray results show: Fairly stable appearance of the levoscoliosis of the lower thoracic and of the lumbar spine.Stable lateral subluxation toward the left of L2 with respect L3. Stable partial compression of the body of L1. Patient complains of frustration with physicians, and that her symptoms are worsening over time. 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 Reported that her pain and difficulty worsened after trying to pick her husband up about 1 year ago. Patient mentioned that she has been told that she has "spinal fluid pockets that are leaking". PMH also includes DM I with insulin pump,  afib, HTN,  HLD, chronic pain syndrome, SOB.    Limitations  Standing;Walking;House hold activities;Other (comment)    Diagnostic tests  see exray results: Fairly stable appearance of the levoscoliosis of the lower thoracic and lumbar spine. Stable lateral subluxation toward left of L2 with respect of L3, stable partial compression of the body of L1.    Patient Stated Goals  pain relief, standing erect    Pain Onset  More than a month ago    Pain Onset  More than a month ago       Ther-Ex -[employer], simultaneous bilateral 1x5 minutes -concurrent with above, BUE wand flexion 1x20x5secH; some L shoulder pain with this, tolerable - Lower trunk rotations x205sec hooklying with cuing to maintain back contact with mat with good carry over - SKTC x30sec each LE; 73min each LE with opposite LE extended - Bridge 2x 10 with cuing for glute contraction and as high of lift as able - Seated on theraball oblique twists with UE crossed and post trunk lean for increased core demand 2x  12 each direction - Seated on theraball alt marching 2x 12 with min cuing initially for posture and to prevent excessive  post lean with LE lift with good carry over - Alt forward lunge with step back 2x 10 (5 each LE) with cuing for large step out and foot clearance with return step with good carry over - Backward walking on treadmill 43mins 0.35mph > 1.87mph for increased hip ext endurance; consistent cuing for increased hip ext with upright posture. Good ability to ext hip slightly beyond neutral with cuing for about 15sec periods before fatigue, needing more cuing - Stair hip flexion stretch 24min each LE   Pain 6/10 at the end of session                      PT Education - 01/08/19 1010    Education Details  therex form    Person(s) Educated  Patient    Methods  Explanation;Demonstration;Verbal cues    Comprehension  Verbalized understanding;Returned demonstration;Verbal cues required       PT Short Term Goals - 07/27/18 0933      PT SHORT TERM GOAL #1   Title  Patient will be adherent to initial HEP at least 3x a week to improve functional strength and balance for better safety at home.    Time  4    Period  Weeks    Status  Achieved    Target Date  12/17/17        PT Long Term Goals - 12/07/18 1028      PT LONG TERM GOAL #1   Title  Patient will be independent in bending down towards floor and picking up small object (<5 pounds) and then stand back up without increased pain to improve ability to pick up and clean up room at home    Baseline  09/25/18/20 able to pick up object without increase in pain, though she reports continued baseline pain, goal revised and achieved    Time  8    Period  Weeks    Status  Achieved      PT LONG TERM GOAL #2   Title  Patient will increase BLE gross strength to 4+/5 as to improve functional strength for independent gait, increased standing tolerance and increased ADL ability.    Baseline  08/31/18 R/L flexion: 5/5 ER 5/5 IR 4+/4+ ABD 5/5 Ext 4+/4    Time  8    Period  Weeks    Status  Achieved      PT LONG TERM GOAL #3   Title  Patient  will report a worst pain of 5/10 with transfers of her husband to improve tolerance with ADLs     Baseline  12/07/18 10/10 pain with transfers    Time  8    Period  Weeks    Status  On-going      PT LONG TERM GOAL #4  Title  Pt will decrease 5TSTS to 10sec order to demonstrate clinically significant improvement in LE strength, and reduced fall risk    Baseline  12/07/18 17sec 10/12/18 10.1sec 09/25/18 11sec 08/31/18 12sec 07/31/18 13sec 07/20/18 15sec 06/10/18 16sec    Time  8    Period  Weeks    Status  On-going      PT LONG TERM GOAL #5   Title  Pt will increase 10MWT by at least 0.13 m/s in order to demonstrate clinically significant improvement in community ambulation    Baseline  09/25/18 0.83 07/31/18 0.58m/s 07/20/18 same 06/10/18 0.65m/s    Time  8    Period  Weeks    Status  Achieved      PT LONG TERM GOAL #6   Title  Patient will demonstrate walk speed of 1.2 to demonstrate community ambulation norm    Baseline  12/07/18 0.72m/s 10/12/18 0.31m/s 09/25/18 0.46m/s    Time  8    Period  Weeks    Status  On-going      PT LONG TERM GOAL #7   Title  Patient will increase standing tolerance to 39min without increased pain in order to complete household chores/cooking    Baseline  12/07/18 Can stand only stand for 15 mins d/t decreased tolerance    Time  8    Period  Weeks    Status  On-going      PT LONG TERM GOAL #8   Title  Patient will demonstrate SLS of 10sec to demonstrate decreased fall risk    Baseline  12/07/18 RLE 4sec LLE 4.2sec 11/19/18 RLE 4sec LLE 6sec    Time  8    Period  Weeks    Status  On-going            Plan - 01/08/19 1031    Clinical Impression Statement  PT continued therex progression for ext posture without UE WB or resistance to prevent L shoulder pain. Patient able to comply with all cuing for proper technique of therex for posture and proper muscle activation with good success. Patient with continued difficulty with hip extension activation, d/t  decreased strength and ROM, but is continuing to make improvements in this with cuing. PT will continue progression as able.    Stability/Clinical Decision Making  Evolving/Moderate complexity    Clinical Decision Making  Moderate    Rehab Potential  Fair    Clinical Impairments Affecting Rehab Potential  comorbidities, symptom duration, curvature degree    PT Frequency  2x / week    PT Duration  8 weeks    PT Treatment/Interventions  ADLs/Self Care Home Management;Aquatic Therapy;Cryotherapy;Ultrasound;Parrafin;Traction;Moist Heat;Electrical Stimulation;DME Instruction;Gait training;Stair training;Functional mobility training;Neuromuscular re-education;Balance training;Therapeutic exercise;Therapeutic activities;Patient/family education;Energy conservation;Spinal Manipulations;Joint Manipulations;Passive range of motion;Dry needling;Manual techniques    PT Next Visit Plan  Progress Hip and core strengthening as tolerated    PT Home Exercise Plan  Added standing pec stretch in doorway to HEP today    Consulted and Agree with Plan of Care  Patient       Patient will benefit from skilled therapeutic intervention in order to improve the following deficits and impairments:  Abnormal gait, Decreased balance, Decreased endurance, Decreased mobility, Difficulty walking, Hypomobility, Increased muscle spasms, Decreased range of motion, Pain, Postural dysfunction, Impaired flexibility, Increased fascial restricitons, Decreased strength, Decreased activity tolerance  Visit Diagnosis: Other idiopathic scoliosis, lumbar region  Radiculopathy, lumbar region     Problem List Patient Active Problem List   Diagnosis Date Noted  .  Central sleep apnea 10/09/2016  . Hypertension 10/09/2016  . Lumbar postlaminectomy syndrome 10/09/2016  . Lumbar radiculopathy 10/09/2016  . Diabetic polyneuropathy ([place]) 10/07/2016  . Right lumbosacral radiculopathy 10/07/2016  . Healthcare maintenance 08/26/2016  . DKA  (diabetic ketoacidoses) ([place]) 06/14/2016  . Atypical chest pain 02/23/2016  . SOB (shortness of breath) 02/23/2016  . Pain medication agreement signed 01/13/2015  . Acquired hypothyroidism 05/30/2014  . Frontal sinusitis 12/17/2013  . Atrial fibrillation ([place]) 11/29/2013  . Major depression in remission ([place]) 11/20/2013  . 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 Chronic pain syndrome 08/27/2013  . Back pain 07/17/2013  . HTN (hypertension), benign 07/17/2013  . Hyperlipidemia 07/17/2013  . Pancreatic insufficiency 07/17/2013  . Chronic postoperative pain 01/01/2013  . Long term current use of opiate analgesic 11/04/2012  . Hypothyroidism 08/25/2012  . Radiculopathy of leg 05/01/2011   Shelton Silvas PT, DPT Shelton Silvas 01/08/2019, 10:46 AM  [facility] [address], [place], [address] Phone: [phone]   Fax:  [phone]  Name: PATSI HAEUSSLER MRN: [id number] Date of Birth: October 28, 1950

## 2019-01-11 ENCOUNTER — Ambulatory Visit: Payer: Medicare Other | Admitting: Physical Therapy

## 2019-01-20 ENCOUNTER — Other Ambulatory Visit: Payer: Self-pay

## 2019-01-20 ENCOUNTER — Encounter: Payer: Self-pay | Admitting: Physical Therapy

## 2019-01-20 ENCOUNTER — Ambulatory Visit: Payer: Medicare Other | Admitting: Physical Therapy

## 2019-01-20 DIAGNOSIS — M4126 Other idiopathic scoliosis, lumbar region: Secondary | ICD-10-CM | POA: Diagnosis not present

## 2019-01-20 DIAGNOSIS — M5416 Radiculopathy, lumbar region: Secondary | ICD-10-CM

## 2019-01-20 NOTE — Therapy (Signed)
[facility] [address], [place], [address] Phone: [phone]   Fax:  [phone]  Physical Therapy Treatment  Patient Details  Name: Colleen Payne MRN: [id number] Date of Birth: 07-May-1950 Referring Provider (PT): Traci Sermon MD   Encounter Date: 01/20/2019  PT End of Session - 01/20/19 1038    Visit Number  21    Number of Visits  52    Date for PT Re-Evaluation  02/01/19    Authorization Type  Medicare    Authorization Time Period  7/10    PT Start Time  1030    PT Stop Time  1115    PT Time Calculation (min)  45 min    Activity Tolerance  Patient tolerated treatment well    Behavior During Therapy  [facility] for tasks assessed/performed;Flat affect       Past Medical History:  Diagnosis Date  . Anxiety   . Atrial fibrillation ([place])   . Depression   . Diabetes mellitus without complication (HCC)    Type I   . Headache    stress. 1x/month  . Hypertension   . Insulin pump in place   . Motion sickness    cars  . MVP (mitral valve prolapse)   . Neuromuscular disorder (HCC)    leg weakness s/p back surgeries  . Thyroid disease     Past Surgical History:  Procedure Laterality Date  . BACK SURGERY  2004   3 sugeries between Sept and Nov, fusion L2-S1  . CATARACT EXTRACTION W/PHACO Left 11/23/2014   Procedure: CATARACT EXTRACTION PHACO AND INTRAOCULAR LENS PLACEMENT (IOC);  Surgeon: Leandrew Koyanagi, MD;  Location: [facility];  Service: Ophthalmology;  Laterality: Left;  DIABETIC - insulin pump    There were no vitals filed for this visit.  Subjective Assessment - 01/20/19 1034    Subjective  Reports LBP is worse following not having PT for 1 week d/t holiday. Is 10/10 today at low back.    Pertinent History  Patient is 68 yo female that complains of lumbar pain and inability to walk erect, as well as hip/abdominal pain. Patient with history of lumbar fusion (L2-S1). Xray  results show: Fairly stable appearance of the levoscoliosis of the lower thoracic and of the lumbar spine.Stable lateral subluxation toward the left of L2 with respect L3. Stable partial compression of the body of L1. Patient complains of frustration with physicians, and that her symptoms are worsening over time. 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 Reported that her pain and difficulty worsened after trying to pick her husband up about 1 year ago. Patient mentioned that she has been told that she has "spinal fluid pockets that are leaking". PMH also includes DM I with insulin pump,  afib, HTN,  HLD, chronic pain syndrome, SOB.    Limitations  Standing;Walking;House hold activities;Other (comment)    Diagnostic tests  see exray results: Fairly stable appearance of the levoscoliosis of the lower thoracic and lumbar spine. Stable lateral subluxation toward left of L2 with respect of L3, stable partial compression of the body of L1.    Patient Stated Goals  pain relief, standing erect    Pain Onset  More than a month ago    Pain Onset  More than a month ago       Ther-Ex -[employer], simultaneous bilateral 1x5 minutes -concurrent with above, BUE wand flexion 1x20x5secH; some  L shoulder pain with this, tolerable - SKTC 2x 30sec each LE cuing to keep opposite LE ext - Bent knee fallouts 2x 30sec with LLE fasiculation - Lower trunk rotationsx20 5sec hooklying with cuing to maintain back contact with mat with good carry over - Wide childs pose 2x 69min hold with TC for proper set up with good carry over - Half kneeling bilat UE flex with yellow physio ball x10 each kneeling position with tactile stabilization and cuing for full hip ext - Prone hip ext 2x 10 with min cuing to prevent rotation with RLE lift - Attempted standing, patient has a "catch" at low back, resolves with x10 seated low back extension - standing hip ext in front of vertical  support 2x 10 with cuing to maintain hip contact with wall    Pain 7/10 at the end of session                         PT Education - 01/20/19 1037    Education Details  therex form    Person(s) Educated  Patient    Methods  Explanation;Demonstration;Tactile cues;Verbal cues    Comprehension  Verbalized understanding;Returned demonstration;Verbal cues required;Tactile cues required       PT Short Term Goals - 07/27/18 0933      PT SHORT TERM GOAL #1   Title  Patient will be adherent to initial HEP at least 3x a week to improve functional strength and balance for better safety at home.    Time  4    Period  Weeks    Status  Achieved    Target Date  12/17/17        PT Long Term Goals - 12/07/18 1028      PT LONG TERM GOAL #1   Title  Patient will be independent in bending down towards floor and picking up small object (<5 pounds) and then stand back up without increased pain to improve ability to pick up and clean up room at home    Baseline  09/25/18/20 able to pick up object without increase in pain, though she reports continued baseline pain, goal revised and achieved    Time  8    Period  Weeks    Status  Achieved      PT LONG TERM GOAL #2   Title  Patient will increase BLE gross strength to 4+/5 as to improve functional strength for independent gait, increased standing tolerance and increased ADL ability.    Baseline  08/31/18 R/L flexion: 5/5 ER 5/5 IR 4+/4+ ABD 5/5 Ext 4+/4    Time  8    Period  Weeks    Status  Achieved      PT LONG TERM GOAL #3   Title  Patient will report a worst pain of 5/10 with transfers of her husband to improve tolerance with ADLs     Baseline  12/07/18 10/10 pain with transfers    Time  8    Period  Weeks    Status  On-going      PT LONG TERM GOAL #4   Title  Pt will decrease 5TSTS to 10sec order to demonstrate clinically significant improvement in LE strength, and reduced fall risk    Baseline  12/07/18 17sec  10/12/18 10.1sec 09/25/18 11sec 08/31/18 12sec 07/31/18 13sec 07/20/18 15sec 06/10/18 16sec    Time  8    Period  Weeks    Status  On-going  PT LONG TERM GOAL #5   Title  Pt will increase 10MWT by at least 0.13 m/s in order to demonstrate clinically significant improvement in community ambulation    Baseline  09/25/18 0.83 07/31/18 0.42m/s 07/20/18 same 06/10/18 0.62m/s    Time  8    Period  Weeks    Status  Achieved      PT LONG TERM GOAL #6   Title  Patient will demonstrate walk speed of 1.2 to demonstrate community ambulation norm    Baseline  12/07/18 0.24m/s 10/12/18 0.49m/s 09/25/18 0.61m/s    Time  8    Period  Weeks    Status  On-going      PT LONG TERM GOAL #7   Title  Patient will increase standing tolerance to 72min without increased pain in order to complete household chores/cooking    Baseline  12/07/18 Can stand only stand for 15 mins d/t decreased tolerance    Time  8    Period  Weeks    Status  On-going      PT LONG TERM GOAL #8   Title  Patient will demonstrate SLS of 10sec to demonstrate decreased fall risk    Baseline  12/07/18 RLE 4sec LLE 4.2sec 11/19/18 RLE 4sec LLE 6sec    Time  8    Period  Weeks    Status  On-going            Plan - 01/20/19 1055    Clinical Impression Statement  PT continued therex progression with some increased time spent on mobility and pain reduction d/t increased pain following absence from PT for 1 week (d/t holiday). Patient is able to complete all therex with modifications as needed, and good compliance of cuing for proper techniques with good success. Patient motivated throughout session. PT will continue progression as able.    Personal Factors and Comorbidities  Age;Behavior Pattern;Fitness;Past/Current Experience;Comorbidity 1;Comorbidity 2;Comorbidity 3+    Comorbidities  scolosis, DM1, chronic pain syndrome, HTN, afib, depression    Examination-Activity Limitations  Bathing;Dressing;Transfers;Caring for  Others;Carry;Squat;Lift;Bend;Bed Mobility    Examination-Participation Restrictions  Yard Work;Laundry;Cleaning;Community Activity    Stability/Clinical Decision Making  Evolving/Moderate complexity    Clinical Decision Making  Moderate    Rehab Potential  Fair    Clinical Impairments Affecting Rehab Potential  comorbidities, symptom duration, curvature degree    PT Frequency  2x / week    PT Treatment/Interventions  ADLs/Self Care Home Management;Aquatic Therapy;Cryotherapy;Ultrasound;Parrafin;Traction;Moist Heat;Electrical Stimulation;DME Instruction;Gait training;Stair training;Functional mobility training;Neuromuscular re-education;Balance training;Therapeutic exercise;Therapeutic activities;Patient/family education;Energy conservation;Spinal Manipulations;Joint Manipulations;Passive range of motion;Dry needling;Manual techniques    PT Next Visit Plan  Progress Hip and core strengthening as tolerated    PT Home Exercise Plan  Added standing pec stretch in doorway to HEP today    Consulted and Agree with Plan of Care  Patient       Patient will benefit from skilled therapeutic intervention in order to improve the following deficits and impairments:  Abnormal gait, Decreased balance, Decreased endurance, Decreased mobility, Difficulty walking, Hypomobility, Increased muscle spasms, Decreased range of motion, Pain, Postural dysfunction, Impaired flexibility, Increased fascial restricitons, Decreased strength, Decreased activity tolerance  Visit Diagnosis: Other idiopathic scoliosis, lumbar region  Radiculopathy, lumbar region     Problem List Patient Active Problem List   Diagnosis Date Noted  . Central sleep apnea 10/09/2016  . Hypertension 10/09/2016  . Lumbar postlaminectomy syndrome 10/09/2016  . Lumbar radiculopathy 10/09/2016  . Diabetic polyneuropathy ([place]) 10/07/2016  . Right lumbosacral radiculopathy 10/07/2016  .  Healthcare maintenance 08/26/2016  . DKA (diabetic  ketoacidoses) ([place]) 06/14/2016  . Atypical chest pain 02/23/2016  . SOB (shortness of breath) 02/23/2016  . Pain medication agreement signed 01/13/2015  . Acquired hypothyroidism 05/30/2014  . Frontal sinusitis 12/17/2013  . Atrial fibrillation ([place]) 11/29/2013  . Major depression in remission ([place]) 11/20/2013  . 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) 10/26/2013  . Chronic pain syndrome 08/27/2013  . Back pain 07/17/2013  . HTN (hypertension), benign 07/17/2013  . Hyperlipidemia 07/17/2013  . Pancreatic insufficiency 07/17/2013  . Chronic postoperative pain 01/01/2013  . Long term current use of opiate analgesic 11/04/2012  . Hypothyroidism 08/25/2012  . Radiculopathy of leg 05/01/2011   Shelton Silvas PT, DPT Shelton Silvas 01/20/2019, 11:09 AM  [facility] [address], [place], [address] Phone: [phone]   Fax:  [phone]  Name: Colleen Payne MRN: [id number] Date of Birth: February 07, 1950

## 2019-01-25 ENCOUNTER — Ambulatory Visit: Payer: Medicare Other | Attending: Anesthesiology | Admitting: Physical Therapy

## 2019-01-25 ENCOUNTER — Other Ambulatory Visit: Payer: Self-pay

## 2019-01-25 ENCOUNTER — Encounter: Payer: Self-pay | Admitting: Physical Therapy

## 2019-01-25 DIAGNOSIS — M4126 Other idiopathic scoliosis, lumbar region: Secondary | ICD-10-CM | POA: Insufficient documentation

## 2019-01-25 DIAGNOSIS — M5416 Radiculopathy, lumbar region: Secondary | ICD-10-CM | POA: Insufficient documentation

## 2019-01-25 NOTE — Therapy (Signed)
[name] [address] [place], [address] Phone: [phone]   Fax:  [phone]  Physical Therapy Treatment  Patient Details  Name: Colleen Payne MRN: [id number] Date of Birth: 03/08/1950 Referring Provider (PT): Traci Sermon MD   Encounter Date: 01/25/2019  PT End of Session - 01/25/19 1152    Visit Number  48    Number of Visits  53    Date for PT Re-Evaluation  02/01/19    Authorization Type  Medicare    Authorization Time Period  8/10    PT Start Time  1117    PT Stop Time  1200    PT Time Calculation (min)  43 min    Activity Tolerance  Patient tolerated treatment well    Behavior During Therapy  [facility] for tasks assessed/performed;Flat affect       Past Medical History:  Diagnosis Date  . Anxiety   . Atrial fibrillation ([place])   . Depression   . Diabetes mellitus without complication (HCC)    Type I   . Headache    stress. 1x/month  . Hypertension   . Insulin pump in place   . Motion sickness    cars  . MVP (mitral valve prolapse)   . Neuromuscular disorder (HCC)    leg weakness s/p back surgeries  . Thyroid disease     Past Surgical History:  Procedure Laterality Date  . BACK SURGERY  2004   3 sugeries between Sept and Nov, fusion L2-S1  . CATARACT EXTRACTION W/PHACO Left 11/23/2014   Procedure: CATARACT EXTRACTION PHACO AND INTRAOCULAR LENS PLACEMENT (IOC);  Surgeon: Leandrew Koyanagi, MD;  Location: [facility];  Service: Ophthalmology;  Laterality: Left;  DIABETIC - insulin pump    There were no vitals filed for this visit.  Subjective Assessment - 01/25/19 1130    Subjective  Reports following last session she has been able to walk better. Continues 7/10 pain.    Pertinent History  Patient is 69 yo female that complains of lumbar pain and inability to walk erect, as well as hip/abdominal pain. Patient with history of lumbar fusion (L2-S1). Xray results show:  Fairly stable appearance of the levoscoliosis of the lower thoracic and of the lumbar spine.Stable lateral subluxation toward the left of L2 with respect L3. Stable partial compression of the body of L1. Patient complains of frustration with physicians, and that her symptoms are worsening over time. 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 Reported that her pain and difficulty worsened after trying to pick her husband up about 1 year ago. Patient mentioned that she has been told that she has "spinal fluid pockets that are leaking". PMH also includes DM I with insulin pump,  afib, HTN,  HLD, chronic pain syndrome, SOB.    Limitations  Standing;Walking;House hold activities;Other (comment)    Diagnostic tests  see exray results: Fairly stable appearance of the levoscoliosis of the lower thoracic and lumbar spine. Stable lateral subluxation toward left of L2 with respect of L3, stable partial compression of the body of L1.    Patient Stated Goals  pain relief, standing erect       Ther-Ex -Thomas Test Stretch, simultaneous bilateral 1x5 minutes -concurrent with above, BUE wand flexion 1x20x5secH; some L shoulder pain with this, tolerable - SKTC 2x 30sec each LE cuing to keep opposite LE ext - Bent knee fallouts 2x 30sec with LLE fasiculation -  Lower trunk rotationsx20 5sec hooklying with cuing to maintain back contact with mat with good carry over - Wide childs pose 2x 63min hold with TC for proper set up with good carry over - Half kneeling bilat UE flex and trunk rotations with yellow physio ball x5 each kneeling position with tactile stabilization and cuing for full hip ext - Backward walking 0.63mph on 4% incline 35mins for increased extensor activation with endurance  - Prone hip ext 2x 12 with min cuing to prevent rotation with RLE lift, good carry over - [employer] ext with PT stabilizing at LEs 2x 12 with good periscapular activation with ext  activation                       PT Education - 01/25/19 1136    Education Details  therex form    Person(s) Educated  Patient    Methods  Explanation;Demonstration;Verbal cues    Comprehension  Verbalized understanding;Returned demonstration;Verbal cues required       PT Short Term Goals - 07/27/18 0933      PT SHORT TERM GOAL #1   Title  Patient will be adherent to initial HEP at least 3x a week to improve functional strength and balance for better safety at home.    Time  4    Period  Weeks    Status  Achieved    Target Date  12/17/17        PT Long Term Goals - 12/07/18 1028      PT LONG TERM GOAL #1   Title  Patient will be independent in bending down towards floor and picking up small object (<5 pounds) and then stand back up without increased pain to improve ability to pick up and clean up room at home    Baseline  09/25/18/20 able to pick up object without increase in pain, though she reports continued baseline pain, goal revised and achieved    Time  8    Period  Weeks    Status  Achieved      PT LONG TERM GOAL #2   Title  Patient will increase BLE gross strength to 4+/5 as to improve functional strength for independent gait, increased standing tolerance and increased ADL ability.    Baseline  08/31/18 R/L flexion: 5/5 ER 5/5 IR 4+/4+ ABD 5/5 Ext 4+/4    Time  8    Period  Weeks    Status  Achieved      PT LONG TERM GOAL #3   Title  Patient will report a worst pain of 5/10 with transfers of her husband to improve tolerance with ADLs     Baseline  12/07/18 10/10 pain with transfers    Time  8    Period  Weeks    Status  On-going      PT LONG TERM GOAL #4   Title  Pt will decrease 5TSTS to 10sec order to demonstrate clinically significant improvement in LE strength, and reduced fall risk    Baseline  12/07/18 17sec 10/12/18 10.1sec 09/25/18 11sec 08/31/18 12sec 07/31/18 13sec 07/20/18 15sec 06/10/18 16sec    Time  8    Period  Weeks    Status   On-going      PT LONG TERM GOAL #5   Title  Pt will increase 10MWT by at least 0.13 m/s in order to demonstrate clinically significant improvement in community ambulation    Baseline  09/25/18 0.83 07/31/18 0.14m/s 07/20/18  same 06/10/18 0.2m/s    Time  8    Period  Weeks    Status  Achieved      PT LONG TERM GOAL #6   Title  Patient will demonstrate walk speed of 1.2 to demonstrate community ambulation norm    Baseline  12/07/18 0.74m/s 10/12/18 0.44m/s 09/25/18 0.80m/s    Time  8    Period  Weeks    Status  On-going      PT LONG TERM GOAL #7   Title  Patient will increase standing tolerance to 75min without increased pain in order to complete household chores/cooking    Baseline  12/07/18 Can stand only stand for 15 mins d/t decreased tolerance    Time  8    Period  Weeks    Status  On-going      PT LONG TERM GOAL #8   Title  Patient will demonstrate SLS of 10sec to demonstrate decreased fall risk    Baseline  12/07/18 RLE 4sec LLE 4.2sec 11/19/18 RLE 4sec LLE 6sec    Time  8    Period  Weeks    Status  On-going            Plan - 01/25/19 1159    Clinical Impression Statement  PT continued therex progression with continued success with stretching, therex aimed at endurance progress, with good success. Patient motivated throughout session and able to complete all therex with proper technique following min cuing and correction. PT will continue progression as able.    Personal Factors and Comorbidities  Age;Behavior Pattern;Fitness;Past/Current Experience;Comorbidity 1;Comorbidity 2;Comorbidity 3+    Comorbidities  scolosis, DM1, chronic pain syndrome, HTN, afib, depression    Examination-Activity Limitations  Bathing;Dressing;Transfers;Caring for Others;Carry;Squat;Lift;Bend;Bed Mobility    Examination-Participation Restrictions  Yard Work;Laundry;Cleaning;Community Activity    Stability/Clinical Decision Making  Evolving/Moderate complexity    Clinical Decision Making  Moderate     Rehab Potential  Fair    Clinical Impairments Affecting Rehab Potential  comorbidities, symptom duration, curvature degree    PT Frequency  2x / week    PT Duration  8 weeks    PT Treatment/Interventions  ADLs/Self Care Home Management;Aquatic Therapy;Cryotherapy;Ultrasound;Parrafin;Traction;Moist Heat;Electrical Stimulation;DME Instruction;Gait training;Stair training;Functional mobility training;Neuromuscular re-education;Balance training;Therapeutic exercise;Therapeutic activities;Patient/family education;Energy conservation;Spinal Manipulations;Joint Manipulations;Passive range of motion;Dry needling;Manual techniques    PT Next Visit Plan  Progress Hip and core strengthening as tolerated    PT Home Exercise Plan  Added standing pec stretch in doorway to HEP today    Consulted and Agree with Plan of Care  Patient       Patient will benefit from skilled therapeutic intervention in order to improve the following deficits and impairments:  Abnormal gait, Decreased balance, Decreased endurance, Decreased mobility, Difficulty walking, Hypomobility, Increased muscle spasms, Decreased range of motion, Pain, Postural dysfunction, Impaired flexibility, Increased fascial restricitons, Decreased strength, Decreased activity tolerance  Visit Diagnosis: Other idiopathic scoliosis, lumbar region  Radiculopathy, lumbar region     Problem List Patient Active Problem List   Diagnosis Date Noted  . Central sleep apnea 10/09/2016  . Hypertension 10/09/2016  . Lumbar postlaminectomy syndrome 10/09/2016  . Lumbar radiculopathy 10/09/2016  . Diabetic polyneuropathy ([place]) 10/07/2016  . Right lumbosacral radiculopathy 10/07/2016  . Healthcare maintenance 08/26/2016  . DKA (diabetic ketoacidoses) ([place]) 06/14/2016  . Atypical chest pain 02/23/2016  . SOB (shortness of breath) 02/23/2016  . Pain medication agreement signed 01/13/2015  . Acquired hypothyroidism 05/30/2014  . Frontal sinusitis  12/17/2013  . Atrial fibrillation ([place])  11/29/2013  . Major depression in remission ([place]) 11/20/2013  . 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 Chronic pain syndrome 08/27/2013  . Back pain 07/17/2013  . HTN (hypertension), benign 07/17/2013  . Hyperlipidemia 07/17/2013  . Pancreatic insufficiency 07/17/2013  . Chronic postoperative pain 01/01/2013  . Long term current use of opiate analgesic 11/04/2012  . Hypothyroidism 08/25/2012  . Radiculopathy of leg 05/01/2011   Shelton Silvas PT, DPT Shelton Silvas 01/25/2019, 12:02 PM  [facility] [address], [place], [address] Phone: [phone]   Fax:  [phone]  Name: Colleen Payne MRN: [id number] Date of Birth: 1950-09-07

## 2019-01-29 ENCOUNTER — Ambulatory Visit: Payer: Medicare Other | Admitting: Physical Therapy

## 2019-01-29 ENCOUNTER — Encounter: Payer: Self-pay | Admitting: Physical Therapy

## 2019-01-29 ENCOUNTER — Other Ambulatory Visit: Payer: Self-pay

## 2019-01-29 DIAGNOSIS — M5416 Radiculopathy, lumbar region: Secondary | ICD-10-CM

## 2019-01-29 DIAGNOSIS — M4126 Other idiopathic scoliosis, lumbar region: Secondary | ICD-10-CM

## 2019-01-29 NOTE — Therapy (Signed)
[facility] [address], [place], [address] Phone: [phone]   Fax:  [phone]  Physical Therapy Treatment  Patient Details  Name: Colleen Payne MRN: [id number] Date of Birth: 23-Jul-1950 Referring Provider (PT): Traci Sermon MD   Encounter Date: 01/29/2019  PT End of Session - 01/29/19 1034    Visit Number  49    Number of Visits  36    Date for PT Re-Evaluation  02/01/19    Authorization Type  Medicare    Authorization Time Period  9/10    PT Start Time  1030    PT Stop Time  1115    PT Time Calculation (min)  45 min    Activity Tolerance  Patient tolerated treatment well    Behavior During Therapy  [facility] for tasks assessed/performed;Flat affect       Past Medical History:  Diagnosis Date  . Anxiety   . Atrial fibrillation ([place])   . Depression   . Diabetes mellitus without complication (HCC)    Type I   . Headache    stress. 1x/month  . Hypertension   . Insulin pump in place   . Motion sickness    cars  . MVP (mitral valve prolapse)   . Neuromuscular disorder (HCC)    leg weakness s/p back surgeries  . Thyroid disease     Past Surgical History:  Procedure Laterality Date  . BACK SURGERY  2004   3 sugeries between Sept and Nov, fusion L2-S1  . CATARACT EXTRACTION W/PHACO Left 11/23/2014   Procedure: CATARACT EXTRACTION PHACO AND INTRAOCULAR LENS PLACEMENT (IOC);  Surgeon: Leandrew Koyanagi, MD;  Location: [facility];  Service: Ophthalmology;  Laterality: Left;  DIABETIC - insulin pump    There were no vitals filed for this visit.  Subjective Assessment - 01/29/19 1032    Subjective  Reports 8/10 LBP today, is continuing to attempt to have more erct posture.    Pertinent History  Patient is 69 yo female that complains of lumbar pain and inability to walk erect, as well as hip/abdominal pain. Patient with history of lumbar fusion (L2-S1). Xray results show: Fairly  stable appearance of the levoscoliosis of the lower thoracic and of the lumbar spine.Stable lateral subluxation toward the left of L2 with respect L3. Stable partial compression of the body of L1. Patient complains of frustration with physicians, and that her symptoms are worsening over time. 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 Reported that her pain and difficulty worsened after trying to pick her husband up about 1 year ago. Patient mentioned that she has been told that she has "spinal fluid pockets that are leaking". PMH also includes DM I with insulin pump,  afib, HTN,  HLD, chronic pain syndrome, SOB.    Limitations  Standing;Walking;House hold activities;Other (comment)    Diagnostic tests  see exray results: Fairly stable appearance of the levoscoliosis of the lower thoracic and lumbar spine. Stable lateral subluxation toward left of L2 with respect of L3, stable partial compression of the body of L1.    Patient Stated Goals  pain relief, standing erect       Ther-Ex -Thomas Test Stretch, simultaneous bilateral 1x5 minutes -concurrent with above, BUE wand flexion 1x20x5secH; some L shoulder pain with this, tolerable - Bird Dog 3x 6 (each side) with cuing for full hip and knee ext with good carry over, some TC stabilization  needed occasionally  Rest in wide childs pose 3x 28min hold between birddog exercise - Half kneeling TA pulldowns with GTB x10 each half kneeling position with tactile stabilization with good carry over - Half kneeling palloff rotations with GTB x10 each half kneeling position with tactile stabilization with good carry over - Slider reverse lunge with maintained hip and knee ext of reversing LE 2x 8 each side with TC initially for technique with good carry over following - Backward treadmill walking  0.22mph 58mins 3%; 36min 2%  incline with consistent cuing for upright trunk and full hip ext with some TC for R hip ext toward  end                         PT Education - 01/29/19 1032    Education Details  therex form    Person(s) Educated  Patient    Methods  Explanation;Demonstration;Verbal cues    Comprehension  Verbalized understanding;Returned demonstration;Verbal cues required       PT Short Term Goals - 07/27/18 0933      PT SHORT TERM GOAL #1   Title  Patient will be adherent to initial HEP at least 3x a week to improve functional strength and balance for better safety at home.    Time  4    Period  Weeks    Status  Achieved    Target Date  12/17/17        PT Long Term Goals - 12/07/18 1028      PT LONG TERM GOAL #1   Title  Patient will be independent in bending down towards floor and picking up small object (<5 pounds) and then stand back up without increased pain to improve ability to pick up and clean up room at home    Baseline  09/25/18/20 able to pick up object without increase in pain, though she reports continued baseline pain, goal revised and achieved    Time  8    Period  Weeks    Status  Achieved      PT LONG TERM GOAL #2   Title  Patient will increase BLE gross strength to 4+/5 as to improve functional strength for independent gait, increased standing tolerance and increased ADL ability.    Baseline  08/31/18 R/L flexion: 5/5 ER 5/5 IR 4+/4+ ABD 5/5 Ext 4+/4    Time  8    Period  Weeks    Status  Achieved      PT LONG TERM GOAL #3   Title  Patient will report a worst pain of 5/10 with transfers of her husband to improve tolerance with ADLs     Baseline  12/07/18 10/10 pain with transfers    Time  8    Period  Weeks    Status  On-going      PT LONG TERM GOAL #4   Title  Pt will decrease 5TSTS to 10sec order to demonstrate clinically significant improvement in LE strength, and reduced fall risk    Baseline  12/07/18 17sec 10/12/18 10.1sec 09/25/18 11sec 08/31/18 12sec 07/31/18 13sec 07/20/18 15sec 06/10/18 16sec    Time  8    Period  Weeks    Status  On-going       PT LONG TERM GOAL #5   Title  Pt will increase 10MWT by at least 0.13 m/s in order to demonstrate clinically significant improvement in community ambulation    Baseline  09/25/18 0.83 07/31/18 0.25m/s  07/20/18 same 06/10/18 0.44m/s    Time  8    Period  Weeks    Status  Achieved      PT LONG TERM GOAL #6   Title  Patient will demonstrate walk speed of 1.2 to demonstrate community ambulation norm    Baseline  12/07/18 0.18m/s 10/12/18 0.75m/s 09/25/18 0.46m/s    Time  8    Period  Weeks    Status  On-going      PT LONG TERM GOAL #7   Title  Patient will increase standing tolerance to 29min without increased pain in order to complete household chores/cooking    Baseline  12/07/18 Can stand only stand for 15 mins d/t decreased tolerance    Time  8    Period  Weeks    Status  On-going      PT LONG TERM GOAL #8   Title  Patient will demonstrate SLS of 10sec to demonstrate decreased fall risk    Baseline  12/07/18 RLE 4sec LLE 4.2sec 11/19/18 RLE 4sec LLE 6sec    Time  8    Period  Weeks    Status  On-going            Plan - 01/29/19 1043    Clinical Impression Statement  PT continued therex for increased core and extensor activation with good success. Patient is improving in upright and walking tolerance and with decreased lateral displacement in standing, half kneeling, and quadraped therex. PT will continue progression as able.    Personal Factors and Comorbidities  Age;Behavior Pattern;Fitness;Past/Current Experience;Comorbidity 1;Comorbidity 2;Comorbidity 3+    Comorbidities  scolosis, DM1, chronic pain syndrome, HTN, afib, depression    Examination-Activity Limitations  Bathing;Dressing;Transfers;Caring for Others;Carry;Squat;Lift;Bend;Bed Mobility    Examination-Participation Restrictions  Yard Work;Laundry;Cleaning;Community Activity    Stability/Clinical Decision Making  Evolving/Moderate complexity    Clinical Decision Making  Moderate    Rehab Potential  Fair     Clinical Impairments Affecting Rehab Potential  comorbidities, symptom duration, curvature degree    PT Frequency  2x / week    PT Duration  8 weeks    PT Treatment/Interventions  ADLs/Self Care Home Management;Aquatic Therapy;Cryotherapy;Ultrasound;Parrafin;Traction;Moist Heat;Electrical Stimulation;DME Instruction;Gait training;Stair training;Functional mobility training;Neuromuscular re-education;Balance training;Therapeutic exercise;Therapeutic activities;Patient/family education;Energy conservation;Spinal Manipulations;Joint Manipulations;Passive range of motion;Dry needling;Manual techniques    PT Next Visit Plan  Progress Hip and core strengthening as tolerated    PT Home Exercise Plan  Added standing pec stretch in doorway to HEP today    Consulted and Agree with Plan of Care  Patient       Patient will benefit from skilled therapeutic intervention in order to improve the following deficits and impairments:  Abnormal gait, Decreased balance, Decreased endurance, Decreased mobility, Difficulty walking, Hypomobility, Increased muscle spasms, Decreased range of motion, Pain, Postural dysfunction, Impaired flexibility, Increased fascial restricitons, Decreased strength, Decreased activity tolerance  Visit Diagnosis: Other idiopathic scoliosis, lumbar region  Radiculopathy, lumbar region     Problem List Patient Active Problem List   Diagnosis Date Noted  . Central sleep apnea 10/09/2016  . Hypertension 10/09/2016  . Lumbar postlaminectomy syndrome 10/09/2016  . Lumbar radiculopathy 10/09/2016  . Diabetic polyneuropathy ([place]) 10/07/2016  . Right lumbosacral radiculopathy 10/07/2016  . Healthcare maintenance 08/26/2016  . DKA (diabetic ketoacidoses) ([place]) 06/14/2016  . Atypical chest pain 02/23/2016  . SOB (shortness of breath) 02/23/2016  . Pain medication agreement signed 01/13/2015  . Acquired hypothyroidism 05/30/2014  . Frontal sinusitis 12/17/2013  . Atrial fibrillation  ([place]) 11/29/2013  .  Major depression in remission ([place]) 11/20/2013  . 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 Chronic pain syndrome 08/27/2013  . Back pain 07/17/2013  . HTN (hypertension), benign 07/17/2013  . Hyperlipidemia 07/17/2013  . Pancreatic insufficiency 07/17/2013  . Chronic postoperative pain 01/01/2013  . Long term current use of opiate analgesic 11/04/2012  . Hypothyroidism 08/25/2012  . Radiculopathy of leg 05/01/2011   Shelton Silvas PT, DPT Shelton Silvas 01/29/2019, 11:17 AM  [facility] [address] [place], [address] Phone: [phone]   Fax:  [phone]  Name: Colleen Payne MRN: [id number] Date of Birth: 26-Jul-1950

## 2019-02-01 ENCOUNTER — Other Ambulatory Visit: Payer: Self-pay

## 2019-02-01 ENCOUNTER — Ambulatory Visit: Payer: Medicare Other | Admitting: Physical Therapy

## 2019-02-01 ENCOUNTER — Encounter: Payer: Self-pay | Admitting: Physical Therapy

## 2019-02-01 DIAGNOSIS — M4126 Other idiopathic scoliosis, lumbar region: Secondary | ICD-10-CM | POA: Diagnosis not present

## 2019-02-01 DIAGNOSIS — M5416 Radiculopathy, lumbar region: Secondary | ICD-10-CM

## 2019-02-01 NOTE — Therapy (Signed)
[facility] [address], [place], [address] Phone: [phone]   Fax:  [phone]  Physical Therapy Treatment/Progress Report Reporting Period 12/07/18 - 02/01/19  Patient Details  Name: Colleen Payne MRN: [id number] Date of Birth: 1950-11-26 Referring Provider (PT): Traci Sermon MD   Encounter Date: 02/01/2019  PT End of Session - 02/01/19 1131    Visit Number  50    Number of Visits  41    Date for PT Re-Evaluation  02/01/19    Authorization Type  Medicare    Authorization Time Period  10/10    PT Start Time  1119    PT Stop Time  1200    PT Time Calculation (min)  41 min    Activity Tolerance  Patient tolerated treatment well       Past Medical History:  Diagnosis Date  . Anxiety   . Atrial fibrillation ([place])   . Depression   . Diabetes mellitus without complication (HCC)    Type I   . Headache    stress. 1x/month  . Hypertension   . Insulin pump in place   . Motion sickness    cars  . MVP (mitral valve prolapse)   . Neuromuscular disorder (HCC)    leg weakness s/p back surgeries  . Thyroid disease     Past Surgical History:  Procedure Laterality Date  . BACK SURGERY  2004   3 sugeries between Sept and Nov, fusion L2-S1  . CATARACT EXTRACTION W/PHACO Left 11/23/2014   Procedure: CATARACT EXTRACTION PHACO AND INTRAOCULAR LENS PLACEMENT (IOC);  Surgeon: Leandrew Koyanagi, MD;  Location: [facility];  Service: Ophthalmology;  Laterality: Left;  DIABETIC - insulin pump    There were no vitals filed for this visit.  Subjective Assessment - 02/01/19 1123    Subjective  Patient reports she did not sleep well last night with increased L shoulder and LBP. Reports 7/10 LBP and pain with L shoulder overhead motion    Pertinent History  Patient is 69 yo female that complains of lumbar pain and inability to walk erect, as well as hip/abdominal pain. Patient with history of  lumbar fusion (L2-S1). Xray results show: Fairly stable appearance of the levoscoliosis of the lower thoracic and of the lumbar spine.Stable lateral subluxation toward the left of L2 with respect L3. Stable partial compression of the body of L1. Patient complains of frustration with physicians, and that her symptoms are worsening over time. 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 Reported that her pain and difficulty worsened after trying to pick her husband up about 1 year ago. Patient mentioned that she has been told that she has "spinal fluid pockets that are leaking". PMH also includes DM I with insulin pump,  afib, HTN,  HLD, chronic pain syndrome, SOB.    Limitations  Standing;Walking;House hold activities;Other (comment)    Diagnostic tests  see exray results: Fairly stable appearance of the levoscoliosis of the lower thoracic and lumbar spine. Stable lateral subluxation toward left of L2 with respect of L3, stable partial compression of the body of L1.    Patient Stated Goals  pain relief, standing erect          Ther-Ex -Thomas Test Stretch, simultaneous bilateral 1x5 minutes -concurrent with above, BUE wand flexion 1x20x5secH; some L shoulder pain with this, tolerable - Hooklying knee fallouts 2x 30min  - SKTC 2x 30sec  -  Hooklying trunk rotations x20 with 3 sec hold - 5xSTS without UE use x2 trials - SLS trials 1: 8sec R and L; R: 8.5sec L: 9sec Visual and verbal review of HEP with updates made with good undersanding. Educaiton on rep/set range for stretching vs. Strengthening, frequency, how to increase/decrease intensity with theraband Modified Thomas Stretch - 1-31min hold - 2x daily - 7x weekly  Sit to Stand - 12 reps - 3 sets - 1x daily - 3-4x weekly  Standing Hip Extension with Resistance at Ankles and Counter Support - 12 reps - 3 sets - 1x daily - 7x weekly  Standing Hip Abduction with Resistance at Ankles and Counter Support - 12  reps - 3 sets - 1x daily - 7x weekly  Standing Lumbar Extension - 12 reps - 3sec hold - 6x daily - 7x weekly    10MWT                       PT Education - 02/01/19 1130    Education Details  therex form    Person(s) Educated  Patient    Methods  Explanation;Demonstration;Tactile cues;Verbal cues    Comprehension  Verbalized understanding;Returned demonstration;Verbal cues required;Tactile cues required       PT Short Term Goals - 07/27/18 0933      PT SHORT TERM GOAL #1   Title  Patient will be adherent to initial HEP at least 3x a week to improve functional strength and balance for better safety at home.    Time  4    Period  Weeks    Status  Achieved    Target Date  12/17/17        PT Long Term Goals - 02/01/19 1131      PT LONG TERM GOAL #1   Title  Patient will be independent in bending down towards floor and picking up small object (<5 pounds) and then stand back up without increased pain to improve ability to pick up and clean up room at home    Baseline  09/25/18/20 able to pick up object without increase in pain, though she reports continued baseline pain, goal revised and achieved    Time  8    Period  Weeks    Status  Achieved      PT LONG TERM GOAL #2   Title  Patient will increase BLE gross strength to 4+/5 as to improve functional strength for independent gait, increased standing tolerance and increased ADL ability.    Baseline  08/31/18 R/L flexion: 5/5 ER 5/5 IR 4+/4+ ABD 5/5 Ext 4+/4    Time  8    Period  Weeks    Status  Achieved      PT LONG TERM GOAL #3   Title  Patient will report a worst pain of 5/10 with transfers of her husband to improve tolerance with ADLs     Baseline  02/01/19 7/10 pain with transfers in the LB, some increased L shoulder pain with transfers    Time  8    Period  Weeks    Status  On-going      PT LONG TERM GOAL #4   Title  Pt will decrease 5TSTS to 10sec order to demonstrate clinically significant  improvement in LE strength, and reduced fall risk    Baseline  02/01/19 10.76sec 12/07/18 17sec    Time  8    Period  Weeks    Status  On-going  PT LONG TERM GOAL #5   Title  Pt will increase 10MWT by at least 0.13 m/s in order to demonstrate clinically significant improvement in community ambulation    Baseline  02/01/19 m/s 09/25/18 0.83 07/31/18 0.12m/s 07/20/18 same 06/10/18 0.29m/s    Time  8    Period  Weeks    Status  Achieved      PT LONG TERM GOAL #6   Title  Patient will demonstrate walk speed of 1.2 to demonstrate community ambulation norm    Baseline  02/01/19 0.78m/s 12/07/18 0.6m/s 10/12/18 0.9m/s 09/25/18 0.71m/s    Time  8    Period  Weeks    Status  On-going      PT LONG TERM GOAL #7   Title  Patient will increase standing tolerance to 35min without increased pain in order to complete household chores/cooking    Baseline  02/01/19 about 37mins before needing a break11/16/20 Can stand only stand for 15 mins d/t decreased tolerance    Time  8    Period  Weeks    Status  On-going      PT LONG TERM GOAL #8   Title  Patient will demonstrate SLS of 10sec to demonstrate decreased fall risk    Baseline  02/01/19 RLE 8.5sec  LLE 9sec 12/07/18 RLE 4sec LLE 4.2sec 11/19/18 RLE 4sec LLE 6sec    Time  8    Period  Weeks    Status  On-going            Plan - 02/01/19 1215    Clinical Impression Statement  PT reassessed goals this session where pt is making steady progress toward goals. PT is able to decrease pain to 5/10 with therex of stretching and gentle motion, allowing for reassessment. PT reviewed and updated HEP with patient with good understanding. PT willcontinue progression towards goals as able.    Personal Factors and Comorbidities  Age;Behavior Pattern;Fitness;Past/Current Experience;Comorbidity 1;Comorbidity 2;Comorbidity 3+    Comorbidities  scolosis, DM1, chronic pain syndrome, HTN, afib, depression    Examination-Activity Limitations   Bathing;Dressing;Transfers;Caring for Others;Carry;Squat;Lift;Bend;Bed Mobility    Examination-Participation Restrictions  Yard Work;Laundry;Cleaning;Community Activity    Stability/Clinical Decision Making  Evolving/Moderate complexity    Clinical Decision Making  Moderate    Rehab Potential  Fair    Clinical Impairments Affecting Rehab Potential  comorbidities, symptom duration, curvature degree    PT Frequency  2x / week    PT Duration  8 weeks    PT Treatment/Interventions  ADLs/Self Care Home Management;Aquatic Therapy;Cryotherapy;Ultrasound;Parrafin;Traction;Moist Heat;Electrical Stimulation;DME Instruction;Gait training;Stair training;Functional mobility training;Neuromuscular re-education;Balance training;Therapeutic exercise;Therapeutic activities;Patient/family education;Energy conservation;Spinal Manipulations;Joint Manipulations;Passive range of motion;Dry needling;Manual techniques    PT Next Visit Plan  Progress Hip and core strengthening as tolerated    PT Home Exercise Plan  Added standing pec stretch in doorway to HEP today    Consulted and Agree with Plan of Care  Patient       Patient will benefit from skilled therapeutic intervention in order to improve the following deficits and impairments:  Abnormal gait, Decreased balance, Decreased endurance, Decreased mobility, Difficulty walking, Hypomobility, Increased muscle spasms, Decreased range of motion, Pain, Postural dysfunction, Impaired flexibility, Increased fascial restricitons, Decreased strength, Decreased activity tolerance  Visit Diagnosis: No diagnosis found.     Problem List Patient Active Problem List   Diagnosis Date Noted  . Central sleep apnea 10/09/2016  . Hypertension 10/09/2016  . Lumbar postlaminectomy syndrome 10/09/2016  . Lumbar radiculopathy 10/09/2016  . Diabetic polyneuropathy ([place])  10/07/2016  . Right lumbosacral radiculopathy 10/07/2016  . Healthcare maintenance 08/26/2016  . DKA (diabetic  ketoacidoses) ([place]) 06/14/2016  . Atypical chest pain 02/23/2016  . SOB (shortness of breath) 02/23/2016  . Pain medication agreement signed 01/13/2015  . Acquired hypothyroidism 05/30/2014  . Frontal sinusitis 12/17/2013  . Atrial fibrillation ([place]) 11/29/2013  . Major depression in remission ([place]) 11/20/2013  . 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 Chronic pain syndrome 08/27/2013  . Back pain 07/17/2013  . HTN (hypertension), benign 07/17/2013  . Hyperlipidemia 07/17/2013  . Pancreatic insufficiency 07/17/2013  . Chronic postoperative pain 01/01/2013  . Long term current use of opiate analgesic 11/04/2012  . Hypothyroidism 08/25/2012  . Radiculopathy of leg 05/01/2011   Shelton Silvas PT, DPT Shelton Silvas 02/01/2019, 12:29 PM  [facility] [address] [place], [address] Phone: [phone]   Fax:  [phone]  Name: DARYAH KLAMM MRN: [id number] Date of Birth: 11/06/1950

## 2019-02-05 ENCOUNTER — Ambulatory Visit: Payer: Medicare Other | Admitting: Physical Therapy

## 2019-02-05 ENCOUNTER — Other Ambulatory Visit: Payer: Self-pay

## 2019-02-05 ENCOUNTER — Encounter: Payer: Self-pay | Admitting: Physical Therapy

## 2019-02-05 DIAGNOSIS — M4126 Other idiopathic scoliosis, lumbar region: Secondary | ICD-10-CM

## 2019-02-05 DIAGNOSIS — M5416 Radiculopathy, lumbar region: Secondary | ICD-10-CM

## 2019-02-05 NOTE — Therapy (Signed)
[facility] [address] [place], [address] Phone: [phone]   Fax:  [phone]  Physical Therapy Treatment  Patient Details  Name: Colleen Payne MRN: [id number] Date of Birth: Apr 21, 1950 No data recorded  Encounter Date: 02/05/2019  PT End of Session - 02/05/19 1038    Visit Number  51    Number of Visits  21    Date for PT Re-Evaluation  03/26/19    Authorization Type  Medicare    Authorization Time Period  1/10    PT Start Time  1032    PT Stop Time  1115    PT Time Calculation (min)  43 min    Activity Tolerance  Patient tolerated treatment well    Behavior During Therapy  [facility] for tasks assessed/performed;Flat affect       Past Medical History:  Diagnosis Date  . Anxiety   . Atrial fibrillation ([place])   . Depression   . Diabetes mellitus without complication (HCC)    Type I   . Headache    stress. 1x/month  . Hypertension   . Insulin pump in place   . Motion sickness    cars  . MVP (mitral valve prolapse)   . Neuromuscular disorder (HCC)    leg weakness s/p back surgeries  . Thyroid disease     Past Surgical History:  Procedure Laterality Date  . BACK SURGERY  2004   3 sugeries between Sept and Nov, fusion L2-S1  . CATARACT EXTRACTION W/PHACO Left 11/23/2014   Procedure: CATARACT EXTRACTION PHACO AND INTRAOCULAR LENS PLACEMENT (IOC);  Surgeon: Leandrew Koyanagi, MD;  Location: [facility];  Service: Ophthalmology;  Laterality: Left;  DIABETIC - insulin pump    There were no vitals filed for this visit.  Subjective Assessment - 02/05/19 1035    Subjective  Reports yesterday she transfered her husband in bed and her back has hurt worse ever since (9/10 pain). Reports her pain has been more L sided, but since that transfer yesterday she has been hurting on bilat low back.    Pertinent History  Patient is 69 yo female that complains of lumbar pain and inability to walk  erect, as well as hip/abdominal pain. Patient with history of lumbar fusion (L2-S1). Xray results show: Fairly stable appearance of the levoscoliosis of the lower thoracic and of the lumbar spine.Stable lateral subluxation toward the left of L2 with respect L3. Stable partial compression of the body of L1. Patient complains of frustration with physicians, and that her symptoms are worsening over time. 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 Reported that her pain and difficulty worsened after trying to pick her husband up about 1 year ago. Patient mentioned that she has been told that she has "spinal fluid pockets that are leaking". PMH also includes DM I with insulin pump,  afib, HTN,  HLD, chronic pain syndrome, SOB.    Limitations  Standing;Walking;House hold activities;Other (comment)    Diagnostic tests  see exray results: Fairly stable appearance of the levoscoliosis of the lower thoracic and lumbar spine. Stable lateral subluxation toward left of L2 with respect of L3, stable partial compression of the body of L1.    Patient Stated Goals  pain relief, standing erect    Pain Onset  More than a month ago    Pain Onset  More than a month ago  Ther-Ex -Thomas Test Stretch, simultaneous bilateral 1x5 minutes -concurrent with above, BUE wand flexion 1x20x5secH; some L shoulder pain with this, tolerable - Hooklying knee fallouts 2x 55min  - SKTC 2x 60sec bilat PT over pressure - Hooklying trunk rotations x20 with 3 sec hold - [place]ornings with PVC pip over head for TC for spinal alignment 3x 8/7/6 with cuing for proper technique through first set, good carry over in subsequent sets - Bent over rows 10# x10; 15# 2x 10 with cuing for set up with neutral spine and periscapular activation to prevent thoracic flexion in gravity dependent position with good carry over - Backward walking 1.21mph for 57mins with decreased speed to 0.42mph for last 30sec,  cuing for cuinng hip ext and upright posture with good carry over with cuing                           PT Education - 02/05/19 1037    Education Details  therex form, HEP reinforcement for pain reduction    Person(s) Educated  Patient    Methods  Explanation;Demonstration;Verbal cues    Comprehension  Verbalized understanding;Returned demonstration;Verbal cues required       PT Short Term Goals - 07/27/18 0933      PT SHORT TERM GOAL #1   Title  Patient will be adherent to initial HEP at least 3x a week to improve functional strength and balance for better safety at home.    Time  4    Period  Weeks    Status  Achieved    Target Date  12/17/17        PT Long Term Goals - 02/01/19 1131      PT LONG TERM GOAL #1   Title  Patient will be independent in bending down towards floor and picking up small object (<5 pounds) and then stand back up without increased pain to improve ability to pick up and clean up room at home    Baseline  09/25/18/20 able to pick up object without increase in pain, though she reports continued baseline pain, goal revised and achieved    Time  8    Period  Weeks    Status  Achieved      PT LONG TERM GOAL #2   Title  Patient will increase BLE gross strength to 4+/5 as to improve functional strength for independent gait, increased standing tolerance and increased ADL ability.    Baseline  08/31/18 R/L flexion: 5/5 ER 5/5 IR 4+/4+ ABD 5/5 Ext 4+/4    Time  8    Period  Weeks    Status  Achieved      PT LONG TERM GOAL #3   Title  Patient will report a worst pain of 5/10 with transfers of her husband to improve tolerance with ADLs     Baseline  02/01/19 7/10 pain with transfers in the LB, some increased L shoulder pain with transfers    Time  8    Period  Weeks    Status  On-going      PT LONG TERM GOAL #4   Title  Pt will decrease 5TSTS to 10sec order to demonstrate clinically significant improvement in LE strength, and reduced fall  risk    Baseline  02/01/19 10.76sec 12/07/18 17sec    Time  8    Period  Weeks    Status  On-going      PT LONG TERM GOAL #5  Title  Pt will increase 10MWT by at least 0.13 m/s in order to demonstrate clinically significant improvement in community ambulation    Baseline  02/01/19 m/s 09/25/18 0.83 07/31/18 0.31m/s 07/20/18 same 06/10/18 0.81m/s    Time  8    Period  Weeks    Status  Achieved      PT LONG TERM GOAL #6   Title  Patient will demonstrate walk speed of 1.2 to demonstrate community ambulation norm    Baseline  02/01/19 0.58m/s 12/07/18 0.62m/s 10/12/18 0.57m/s 09/25/18 0.71m/s    Time  8    Period  Weeks    Status  On-going      PT LONG TERM GOAL #7   Title  Patient will increase standing tolerance to 55min without increased pain in order to complete household chores/cooking    Baseline  02/01/19 about 79mins before needing a break11/16/20 Can stand only stand for 15 mins d/t decreased tolerance    Time  8    Period  Weeks    Status  On-going      PT LONG TERM GOAL #8   Title  Patient will demonstrate SLS of 10sec to demonstrate decreased fall risk    Baseline  02/01/19 RLE 8.5sec  LLE 9sec 12/07/18 RLE 4sec LLE 4.2sec 11/19/18 RLE 4sec LLE 6sec    Time  8    Period  Weeks    Status  On-going            Plan - 02/05/19 1056    Clinical Impression Statement  PT continued therex progression for neutral spinal alignment, hip anc core activaiton, and functional movements/transfers with good success. Patient is motivated throughout session and is able to comply with all cuing for proper technique and muscle activation. Education on functional movements from PT and their carry over to transferring husband with verbalized understanding. Education on HEP reinforcement for pain managmeent with good understanding and acceptance. PT will continue progresison as able.    Personal Factors and Comorbidities  Age;Behavior Pattern;Fitness;Past/Current Experience;Comorbidity 1;Comorbidity  2;Comorbidity 3+    Comorbidities  scolosis, DM1, chronic pain syndrome, HTN, afib, depression    Examination-Activity Limitations  Bathing;Dressing;Transfers;Caring for Others;Carry;Squat;Lift;Bend;Bed Mobility    Examination-Participation Restrictions  Yard Work;Laundry;Cleaning;Community Activity    Stability/Clinical Decision Making  Evolving/Moderate complexity    Clinical Decision Making  Moderate    Rehab Potential  Fair    Clinical Impairments Affecting Rehab Potential  comorbidities, symptom duration, curvature degree    PT Frequency  2x / week    PT Duration  8 weeks    PT Treatment/Interventions  ADLs/Self Care Home Management;Aquatic Therapy;Cryotherapy;Ultrasound;Parrafin;Traction;Moist Heat;Electrical Stimulation;DME Instruction;Gait training;Stair training;Functional mobility training;Neuromuscular re-education;Balance training;Therapeutic exercise;Therapeutic activities;Patient/family education;Energy conservation;Spinal Manipulations;Joint Manipulations;Passive range of motion;Dry needling;Manual techniques    PT Next Visit Plan  Progress Hip and core strengthening as tolerated    PT Home Exercise Plan  Added standing pec stretch in doorway to HEP today    Consulted and Agree with Plan of Care  Patient       Patient will benefit from skilled therapeutic intervention in order to improve the following deficits and impairments:  Abnormal gait, Decreased balance, Decreased endurance, Decreased mobility, Difficulty walking, Hypomobility, Increased muscle spasms, Decreased range of motion, Pain, Postural dysfunction, Impaired flexibility, Increased fascial restricitons, Decreased strength, Decreased activity tolerance  Visit Diagnosis: Other idiopathic scoliosis, lumbar region  Radiculopathy, lumbar region     Problem List Patient Active Problem List   Diagnosis Date Noted  . Central sleep  apnea 10/09/2016  . Hypertension 10/09/2016  . Lumbar postlaminectomy syndrome  10/09/2016  . Lumbar radiculopathy 10/09/2016  . Diabetic polyneuropathy ([place]) 10/07/2016  . Right lumbosacral radiculopathy 10/07/2016  . Healthcare maintenance 08/26/2016  . DKA (diabetic ketoacidoses) ([place]) 06/14/2016  . Atypical chest pain 02/23/2016  . SOB (shortness of breath) 02/23/2016  . Pain medication agreement signed 01/13/2015  . Acquired hypothyroidism 05/30/2014  . Frontal sinusitis 12/17/2013  . Atrial fibrillation ([place]) 11/29/2013  . Major depression in remission ([place]) 11/20/2013  . 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 Chronic pain syndrome 08/27/2013  . Back pain 07/17/2013  . HTN (hypertension), benign 07/17/2013  . Hyperlipidemia 07/17/2013  . Pancreatic insufficiency 07/17/2013  . Chronic postoperative pain 01/01/2013  . Long term current use of opiate analgesic 11/04/2012  . Hypothyroidism 08/25/2012  . Radiculopathy of leg 05/01/2011   Shelton Silvas PT, DPT Shelton Silvas 02/05/2019, 11:16 AM  [facility] [address], [place], [address] Phone: [phone]   Fax:  [phone]  Name: Colleen Payne MRN: [id number] Date of Birth: 29-Apr-1950

## 2019-02-05 NOTE — Addendum Note (Signed)
Addended by: Shelton Silvas on: 02/05/2019 11:18 AM   Modules accepted: Orders

## 2019-02-08 ENCOUNTER — Ambulatory Visit: Payer: Medicare Other | Admitting: Physical Therapy

## 2019-02-08 ENCOUNTER — Other Ambulatory Visit: Payer: Self-pay

## 2019-02-08 ENCOUNTER — Encounter: Payer: Self-pay | Admitting: Physical Therapy

## 2019-02-08 DIAGNOSIS — M4126 Other idiopathic scoliosis, lumbar region: Secondary | ICD-10-CM

## 2019-02-08 DIAGNOSIS — M5416 Radiculopathy, lumbar region: Secondary | ICD-10-CM

## 2019-02-08 NOTE — Therapy (Signed)
[facility] [address] [place], [address] Phone: [phone]   Fax:  [phone]  Physical Therapy Treatment  Patient Details  Name: Colleen Payne MRN: [id number] Date of Birth: 18-Jan-1951 No data recorded  Encounter Date: 02/08/2019  PT End of Session - 02/08/19 1001    Visit Number  52    Number of Visits  41    Date for PT Re-Evaluation  03/26/19    Authorization Type  Medicare    Authorization Time Period  2/10    PT Start Time  0951    PT Stop Time  1030    PT Time Calculation (min)  39 min    Activity Tolerance  Patient tolerated treatment well    Behavior During Therapy  [facility] for tasks assessed/performed;Flat affect       Past Medical History:  Diagnosis Date  . Anxiety   . Atrial fibrillation ([place])   . Depression   . Diabetes mellitus without complication (HCC)    Type I   . Headache    stress. 1x/month  . Hypertension   . Insulin pump in place   . Motion sickness    cars  . MVP (mitral valve prolapse)   . Neuromuscular disorder (HCC)    leg weakness s/p back surgeries  . Thyroid disease     Past Surgical History:  Procedure Laterality Date  . BACK SURGERY  2004   3 sugeries between Sept and Nov, fusion L2-S1  . CATARACT EXTRACTION W/PHACO Left 11/23/2014   Procedure: CATARACT EXTRACTION PHACO AND INTRAOCULAR LENS PLACEMENT (IOC);  Surgeon: Leandrew Koyanagi, MD;  Location: [place];  Service: Ophthalmology;  Laterality: Left;  DIABETIC - insulin pump    There were no vitals filed for this visit.  Subjective Assessment - 02/08/19 0954    Subjective  Patient reports she is having 8/10 LBP today. Reports she has been completing stretching for beginning of PT sessions at home which is helpful to decrease pain. Reports she is having difficult refilling her pain medication and this is causing her some stretch    Pertinent History  Patient is 69 yo female that complains  of lumbar pain and inability to walk erect, as well as hip/abdominal pain. Patient with history of lumbar fusion (L2-S1). Xray results show: Fairly stable appearance of the levoscoliosis of the lower thoracic and of the lumbar spine.Stable lateral subluxation toward the left of L2 with respect L3. Stable partial compression of the body of L1. Patient complains of frustration with physicians, and that her symptoms are worsening over time. 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 Reported that her pain and difficulty worsened after trying to pick her husband up about 1 year ago. Patient mentioned that she has been told that she has "spinal fluid pockets that are leaking". PMH also includes DM I with insulin pump,  afib, HTN,  HLD, chronic pain syndrome, SOB.    Limitations  Standing;Walking;House hold activities;Other (comment)    Diagnostic tests  see exray results: Fairly stable appearance of the levoscoliosis of the lower thoracic and lumbar spine. Stable lateral subluxation toward left of L2 with respect of L3, stable partial compression of the body of L1.    Patient Stated Goals  pain relief, standing erect    Pain Onset  More than a month ago    Pain Onset  More than a month ago  Ther-Ex -[employer] Test Stretch, simultaneous bilateral 1x5 minutes -concurrent with above, BUE wand flexion 1x20x5secH; some L shoulder pain with this, tolerable - Hooklying knee fallouts 2x 42min  - SKTC 2x 60sec bilat PT over pressure - Hooklying trunk rotations x20 with 3 sec hold - Prone alt hip ext x8 each LE min cuing for maintained positioning with good carry over; alt supermans 2x 6 each combo - Seated theraball marching x8 each LE; with opposite rotation 2x 6 each combo with breaks between in upright posture with good carry over of proper sitting posture - Bent over rows 10# x10; 15# 2x 10 with cuing for set up with neutral spine and periscapular activation to  prevent thoracic flexion in gravity dependent position with good carry over - Backward walking 1.49mph for 81mins with decreased speed to 0.6mph for last 30sec, cuing for cuinng hip ext and upright posture with good carry over with cuing                            PT Education - 02/08/19 1000    Education Details  therex form    Person(s) Educated  Patient    Methods  Explanation;Demonstration;Verbal cues    Comprehension  Verbalized understanding;Returned demonstration;Verbal cues required       PT Short Term Goals - 07/27/18 0933      PT SHORT TERM GOAL #1   Title  Patient will be adherent to initial HEP at least 3x a week to improve functional strength and balance for better safety at home.    Time  4    Period  Weeks    Status  Achieved    Target Date  12/17/17        PT Long Term Goals - 02/01/19 1131      PT LONG TERM GOAL #1   Title  Patient will be independent in bending down towards floor and picking up small object (<5 pounds) and then stand back up without increased pain to improve ability to pick up and clean up room at home    Baseline  09/25/18/20 able to pick up object without increase in pain, though she reports continued baseline pain, goal revised and achieved    Time  8    Period  Weeks    Status  Achieved      PT LONG TERM GOAL #2   Title  Patient will increase BLE gross strength to 4+/5 as to improve functional strength for independent gait, increased standing tolerance and increased ADL ability.    Baseline  08/31/18 R/L flexion: 5/5 ER 5/5 IR 4+/4+ ABD 5/5 Ext 4+/4    Time  8    Period  Weeks    Status  Achieved      PT LONG TERM GOAL #3   Title  Patient will report a worst pain of 5/10 with transfers of her husband to improve tolerance with ADLs     Baseline  02/01/19 7/10 pain with transfers in the LB, some increased L shoulder pain with transfers    Time  8    Period  Weeks    Status  On-going      PT LONG TERM GOAL #4    Title  Pt will decrease 5TSTS to 10sec order to demonstrate clinically significant improvement in LE strength, and reduced fall risk    Baseline  02/01/19 10.76sec 12/07/18 17sec    Time  8    Period  Weeks    Status  On-going      PT LONG TERM GOAL #5   Title  Pt will increase 10MWT by at least 0.13 m/s in order to demonstrate clinically significant improvement in community ambulation    Baseline  02/01/19 m/s 09/25/18 0.83 07/31/18 0.93m/s 07/20/18 same 06/10/18 0.90m/s    Time  8    Period  Weeks    Status  Achieved      PT LONG TERM GOAL #6   Title  Patient will demonstrate walk speed of 1.2 to demonstrate community ambulation norm    Baseline  02/01/19 0.7m/s 12/07/18 0.62m/s 10/12/18 0.77m/s 09/25/18 0.47m/s    Time  8    Period  Weeks    Status  On-going      PT LONG TERM GOAL #7   Title  Patient will increase standing tolerance to 40min without increased pain in order to complete household chores/cooking    Baseline  02/01/19 about 67mins before needing a break11/16/20 Can stand only stand for 15 mins d/t decreased tolerance    Time  8    Period  Weeks    Status  On-going      PT LONG TERM GOAL #8   Title  Patient will demonstrate SLS of 10sec to demonstrate decreased fall risk    Baseline  02/01/19 RLE 8.5sec  LLE 9sec 12/07/18 RLE 4sec LLE 4.2sec 11/19/18 RLE 4sec LLE 6sec    Time  8    Period  Weeks    Status  On-going            Plan - 02/08/19 1004    Clinical Impression Statement  PT continued therex progression for increased mobility and extensor strength with good success. Patient is able to complete all therex with proper technique following cuing for posture and proper muscle activation. Patient is motivated throughout session, though is clearly stressed by medication refills. PT will continue progression as able.    Personal Factors and Comorbidities  Age;Behavior Pattern;Fitness;Past/Current Experience;Comorbidity 1;Comorbidity 2;Comorbidity 3+    Comorbidities   scolosis, DM1, chronic pain syndrome, HTN, afib, depression    Examination-Activity Limitations  Bathing;Dressing;Transfers;Caring for Others;Carry;Squat;Lift;Bend;Bed Mobility    Examination-Participation Restrictions  Yard Work;Laundry;Cleaning;Community Activity    Stability/Clinical Decision Making  Evolving/Moderate complexity    Clinical Decision Making  Moderate    Rehab Potential  Fair    Clinical Impairments Affecting Rehab Potential  comorbidities, symptom duration, curvature degree    PT Frequency  2x / week    PT Duration  8 weeks    PT Treatment/Interventions  ADLs/Self Care Home Management;Aquatic Therapy;Cryotherapy;Ultrasound;Parrafin;Traction;Moist Heat;Electrical Stimulation;DME Instruction;Gait training;Stair training;Functional mobility training;Neuromuscular re-education;Balance training;Therapeutic exercise;Therapeutic activities;Patient/family education;Energy conservation;Spinal Manipulations;Joint Manipulations;Passive range of motion;Dry needling;Manual techniques    PT Next Visit Plan  Progress Hip and core strengthening as tolerated    PT Home Exercise Plan  Added standing pec stretch in doorway to HEP today    Consulted and Agree with Plan of Care  Patient       Patient will benefit from skilled therapeutic intervention in order to improve the following deficits and impairments:  Abnormal gait, Decreased balance, Decreased endurance, Decreased mobility, Difficulty walking, Hypomobility, Increased muscle spasms, Decreased range of motion, Pain, Postural dysfunction, Impaired flexibility, Increased fascial restricitons, Decreased strength, Decreased activity tolerance  Visit Diagnosis: Other idiopathic scoliosis, lumbar region  Radiculopathy, lumbar region     Problem List Patient Active Problem List   Diagnosis Date Noted  . Central sleep apnea 10/09/2016  .  Hypertension 10/09/2016  . Lumbar postlaminectomy syndrome 10/09/2016  . Lumbar radiculopathy  10/09/2016  . Diabetic polyneuropathy ([place]) 10/07/2016  . Right lumbosacral radiculopathy 10/07/2016  . Healthcare maintenance 08/26/2016  . DKA (diabetic ketoacidoses) ([place]) 06/14/2016  . Atypical chest pain 02/23/2016  . SOB (shortness of breath) 02/23/2016  . Pain medication agreement signed 01/13/2015  . Acquired hypothyroidism 05/30/2014  . Frontal sinusitis 12/17/2013  . Atrial fibrillation ([place]) 11/29/2013  . Major depression in remission ([place]) 11/20/2013  . 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 Chronic pain syndrome 08/27/2013  . Back pain 07/17/2013  . HTN (hypertension), benign 07/17/2013  . Hyperlipidemia 07/17/2013  . Pancreatic insufficiency 07/17/2013  . Chronic postoperative pain 01/01/2013  . Long term current use of opiate analgesic 11/04/2012  . Hypothyroidism 08/25/2012  . Radiculopathy of leg 05/01/2011   Shelton Silvas PT, DPT Shelton Silvas 02/08/2019, 10:48 AM  [facility] [address], [place], [address] Phone: [phone]   Fax:  [phone]  Name: SHUNTIA COPAS MRN: [id number] Date of Birth: 13-Oct-1950

## 2019-02-09 ENCOUNTER — Encounter: Payer: Medicare Other | Admitting: Physical Therapy

## 2019-02-12 ENCOUNTER — Ambulatory Visit: Payer: Medicare Other | Admitting: Physical Therapy

## 2019-02-16 ENCOUNTER — Encounter: Payer: Medicare Other | Admitting: Physical Therapy

## 2019-02-19 ENCOUNTER — Ambulatory Visit: Payer: Medicare Other | Admitting: Physical Therapy

## 2019-02-19 ENCOUNTER — Other Ambulatory Visit: Payer: Self-pay

## 2019-02-19 ENCOUNTER — Encounter: Payer: Self-pay | Admitting: Physical Therapy

## 2019-02-19 DIAGNOSIS — M5416 Radiculopathy, lumbar region: Secondary | ICD-10-CM

## 2019-02-19 DIAGNOSIS — M4126 Other idiopathic scoliosis, lumbar region: Secondary | ICD-10-CM

## 2019-02-19 NOTE — Therapy (Signed)
[facility] [address] [place], [address] Phone: [phone]   Fax:  [phone]  Physical Therapy Treatment  Patient Details  Name: Colleen Payne MRN: [id number] Date of Birth: [birth year] No data recorded  Encounter Date: 02/19/2019  PT End of Session - 02/19/19 1035    Visit Number  52    Number of Visits  75    Date for PT Re-Evaluation  03/26/19    Authorization Type  Medicare    Authorization Time Period  3/10    PT Start Time  1027    PT Stop Time  1110    PT Time Calculation (min)  43 min    Activity Tolerance  Patient tolerated treatment well    Behavior During Therapy  [facility] for tasks assessed/performed;Flat affect       Past Medical History:  Diagnosis Date  . Anxiety   . Atrial fibrillation ([place])   . Depression   . Diabetes mellitus without complication (HCC)    Type I   . Headache    stress. 1x/month  . Hypertension   . Insulin pump in place   . Motion sickness    cars  . MVP (mitral valve prolapse)   . Neuromuscular disorder (HCC)    leg weakness s/p back surgeries  . Thyroid disease     Past Surgical History:  Procedure Laterality Date  . BACK SURGERY  2004   3 sugeries between Sept and Nov, fusion L2-S1  . CATARACT EXTRACTION W/PHACO Left 11/23/2014   Procedure: CATARACT EXTRACTION PHACO AND INTRAOCULAR LENS PLACEMENT (IOC);  Surgeon: Leandrew Koyanagi, MD;  Location: [place];  Service: Ophthalmology;  Laterality: Left;  DIABETIC - insulin pump    There were no vitals filed for this visit.  Subjective Assessment - 02/19/19 1031    Subjective  Reports she is feeling better today, LBP is 7/10 and her bronchitis is better. Is doing well overall today, but has some increased stiffness d/t not being able to come to PT earlier this week.    Pertinent History  Patient is 69 yo female that complains of lumbar pain and inability to walk erect, as well as  hip/abdominal pain. Patient with history of lumbar fusion (L2-S1). Xray results show: Fairly stable appearance of the levoscoliosis of the lower thoracic and of the lumbar spine.Stable lateral subluxation toward the left of L2 with respect L3. Stable partial compression of the body of L1. Patient complains of frustration with physicians, and that her symptoms are worsening over time. 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 Reported that her pain and difficulty worsened after trying to pick her husband up about 1 year ago. Patient mentioned that she has been told that she has "spinal fluid pockets that are leaking". PMH also includes DM I with insulin pump,  afib, HTN,  HLD, chronic pain syndrome, SOB.    Limitations  Standing;Walking;House hold activities;Other (comment)    Diagnostic tests  see exray results: Fairly stable appearance of the levoscoliosis of the lower thoracic and lumbar spine. Stable lateral subluxation toward left of L2 with respect of L3, stable partial compression of the body of L1.    Patient Stated Goals  pain relief, standing erect    Pain Onset  More than a month ago    Pain Onset  More than a month ago         Ther-Ex -[employer]  Stretch, simultaneous bilateral 1x5 minutes -concurrent with above, BUE wand flexion 1x20x5secH; some L shoulder pain with this, tolerable - Hooklying knee fallouts 2x 37min  - Sidelying open book x12  5sec holds for increased motion - [employer] ext with hands behind head 3x 10/9/8 with PT stabilizing LEs to mat table; attempted prone chest lift, unable - Bird dogs 3x 8 with very minimal stabilization needed, cuing for full hip/knee ext with decent carry over; rest breaks in childs pose x30min hold - Seated theraball marching x10 each LE; with opposite rotation 2x 6 each combo with breaks between in upright posture with good carry over of proper sitting posture -Backward walking 1.65mph for  30mins, cuing for cuinng hip ext and upright posture with good carry over with cuing                           PT Education - 02/19/19 1033    Education Details  therex form    Person(s) Educated  Patient    Methods  Explanation;Demonstration;Tactile cues;Verbal cues    Comprehension  Verbalized understanding;Returned demonstration;Verbal cues required;Tactile cues required       PT Short Term Goals - 07/27/18 0933      PT SHORT TERM GOAL #1   Title  Patient will be adherent to initial HEP at least 3x a week to improve functional strength and balance for better safety at home.    Time  4    Period  Weeks    Status  Achieved    Target Date  12/17/17        PT Long Term Goals - 02/01/19 1131      PT LONG TERM GOAL #1   Title  Patient will be independent in bending down towards floor and picking up small object (<5 pounds) and then stand back up without increased pain to improve ability to pick up and clean up room at home    Baseline  09/25/18/20 able to pick up object without increase in pain, though she reports continued baseline pain, goal revised and achieved    Time  8    Period  Weeks    Status  Achieved      PT LONG TERM GOAL #2   Title  Patient will increase BLE gross strength to 4+/5 as to improve functional strength for independent gait, increased standing tolerance and increased ADL ability.    Baseline  08/31/18 R/L flexion: 5/5 ER 5/5 IR 4+/4+ ABD 5/5 Ext 4+/4    Time  8    Period  Weeks    Status  Achieved      PT LONG TERM GOAL #3   Title  Patient will report a worst pain of 5/10 with transfers of her husband to improve tolerance with ADLs     Baseline  02/01/19 7/10 pain with transfers in the LB, some increased L shoulder pain with transfers    Time  8    Period  Weeks    Status  On-going      PT LONG TERM GOAL #4   Title  Pt will decrease 5TSTS to 10sec order to demonstrate clinically significant improvement in LE strength, and  reduced fall risk    Baseline  02/01/19 10.76sec 12/07/18 17sec    Time  8    Period  Weeks    Status  On-going      PT LONG TERM GOAL #5   Title  Pt  will increase 10MWT by at least 0.13 m/s in order to demonstrate clinically significant improvement in community ambulation    Baseline  02/01/19 m/s 09/25/18 0.83 07/31/18 0.42m/s 07/20/18 same 06/10/18 0.39m/s    Time  8    Period  Weeks    Status  Achieved      PT LONG TERM GOAL #6   Title  Patient will demonstrate walk speed of 1.2 to demonstrate community ambulation norm    Baseline  02/01/19 0.77m/s 12/07/18 0.91m/s 10/12/18 0.74m/s 09/25/18 0.49m/s    Time  8    Period  Weeks    Status  On-going      PT LONG TERM GOAL #7   Title  Patient will increase standing tolerance to 63min without increased pain in order to complete household chores/cooking    Baseline  02/01/19 about 71mins before needing a break11/16/20 Can stand only stand for 15 mins d/t decreased tolerance    Time  8    Period  Weeks    Status  On-going      PT LONG TERM GOAL #8   Title  Patient will demonstrate SLS of 10sec to demonstrate decreased fall risk    Baseline  02/01/19 RLE 8.5sec  LLE 9sec 12/07/18 RLE 4sec LLE 4.2sec 11/19/18 RLE 4sec LLE 6sec    Time  8    Period  Weeks    Status  On-going            Plan - 02/19/19 1048    Clinical Impression Statement  PT continued therex progression with increased resistance/demand of extensors and core stabilizers. Pt is motivated throughout session and is able to comply with cuing for proper technique. PT will continue progression as able.    Personal Factors and Comorbidities  Age;Behavior Pattern;Fitness;Past/Current Experience;Comorbidity 1;Comorbidity 2;Comorbidity 3+    Comorbidities  scolosis, DM1, chronic pain syndrome, HTN, afib, depression    Examination-Activity Limitations  Bathing;Dressing;Transfers;Caring for Others;Carry;Squat;Lift;Bend;Bed Mobility    Examination-Participation Restrictions  Yard  Work;Laundry;Cleaning;Community Activity    Stability/Clinical Decision Making  Evolving/Moderate complexity    Clinical Decision Making  Moderate    Rehab Potential  Fair    Clinical Impairments Affecting Rehab Potential  comorbidities, symptom duration, curvature degree    PT Frequency  2x / week    PT Duration  8 weeks    PT Treatment/Interventions  ADLs/Self Care Home Management;Aquatic Therapy;Cryotherapy;Ultrasound;Parrafin;Traction;Moist Heat;Electrical Stimulation;DME Instruction;Gait training;Stair training;Functional mobility training;Neuromuscular re-education;Balance training;Therapeutic exercise;Therapeutic activities;Patient/family education;Energy conservation;Spinal Manipulations;Joint Manipulations;Passive range of motion;Dry needling;Manual techniques    PT Next Visit Plan  Progress Hip and core strengthening as tolerated    PT Home Exercise Plan  Added standing pec stretch in doorway to HEP today    Consulted and Agree with Plan of Care  Patient       Patient will benefit from skilled therapeutic intervention in order to improve the following deficits and impairments:  Abnormal gait, Decreased balance, Decreased endurance, Decreased mobility, Difficulty walking, Hypomobility, Increased muscle spasms, Decreased range of motion, Pain, Postural dysfunction, Impaired flexibility, Increased fascial restricitons, Decreased strength, Decreased activity tolerance  Visit Diagnosis: Other idiopathic scoliosis, lumbar region  Radiculopathy, lumbar region     Problem List Patient Active Problem List   Diagnosis Date Noted  . Central sleep apnea 10/09/2016  . Hypertension 10/09/2016  . Lumbar postlaminectomy syndrome 10/09/2016  . Lumbar radiculopathy 10/09/2016  . Diabetic polyneuropathy ([place]) 10/07/2016  . Right lumbosacral radiculopathy 10/07/2016  . Healthcare maintenance 08/26/2016  . DKA (diabetic ketoacidoses) ([place]) 06/14/2016  .  Atypical chest pain 02/23/2016  . SOB  (shortness of breath) 02/23/2016  . Pain medication agreement signed 01/13/2015  . Acquired hypothyroidism 05/30/2014  . Frontal sinusitis 12/17/2013  . Atrial fibrillation ([place]) 11/29/2013  . Major depression in remission ([place]) 11/20/2013  . 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 Chronic pain syndrome 08/27/2013  . Back pain 07/17/2013  . HTN (hypertension), benign 07/17/2013  . Hyperlipidemia 07/17/2013  . Pancreatic insufficiency 07/17/2013  . Chronic postoperative pain 01/01/2013  . Long term current use of opiate analgesic 11/04/2012  . Hypothyroidism 08/25/2012  . Radiculopathy of leg 05/01/2011   Shelton Silvas PT, DPT Shelton Silvas 02/19/2019, 11:11 AM  [facility] [address], [place], [address] Phone: [phone]   Fax:  [phone]  Name: Colleen Payne MRN: [id number] Date of Birth: [birth year]

## 2019-02-22 ENCOUNTER — Ambulatory Visit: Payer: Medicare Other | Attending: Anesthesiology | Admitting: Physical Therapy

## 2019-02-22 ENCOUNTER — Other Ambulatory Visit: Payer: Self-pay

## 2019-02-22 ENCOUNTER — Encounter: Payer: Self-pay | Admitting: Physical Therapy

## 2019-02-22 DIAGNOSIS — M5416 Radiculopathy, lumbar region: Secondary | ICD-10-CM | POA: Diagnosis present

## 2019-02-22 DIAGNOSIS — M4126 Other idiopathic scoliosis, lumbar region: Secondary | ICD-10-CM | POA: Insufficient documentation

## 2019-02-22 NOTE — Therapy (Signed)
[facility] [address], [place], [address] Phone: [phone]   Fax:  [phone]  Physical Therapy Treatment  Patient Details  Name: Colleen Payne MRN: [id number] Date of Birth: 07-Feb-1950 No data recorded  Encounter Date: 02/22/2019  PT End of Session - 02/22/19 1120    Visit Number  53    Number of Visits  66    Date for PT Re-Evaluation  03/26/19    Authorization Type  Medicare    Authorization Time Period  4/10    PT Start Time  1115    PT Stop Time  1200    PT Time Calculation (min)  45 min    Activity Tolerance  Patient tolerated treatment well    Behavior During Therapy  [facility] for tasks assessed/performed;Flat affect       Past Medical History:  Diagnosis Date  . Anxiety   . Atrial fibrillation ([place])   . Depression   . Diabetes mellitus without complication (HCC)    Type I   . Headache    stress. 1x/month  . Hypertension   . Insulin pump in place   . Motion sickness    cars  . MVP (mitral valve prolapse)   . Neuromuscular disorder (HCC)    leg weakness s/p back surgeries  . Thyroid disease     Past Surgical History:  Procedure Laterality Date  . BACK SURGERY  2004   3 sugeries between Sept and Nov, fusion L2-S1  . CATARACT EXTRACTION W/PHACO Left 11/23/2014   Procedure: CATARACT EXTRACTION PHACO AND INTRAOCULAR LENS PLACEMENT (IOC);  Surgeon: Leandrew Koyanagi, MD;  Location: [facility] [facility];  Service: Ophthalmology;  Laterality: Left;  DIABETIC - insulin pump    There were no vitals filed for this visit.  Subjective Assessment - 02/22/19 1117    Subjective  Reports 10/10 R sided LBP that feels "very tight" this am. Reports her pain began worsening Saturday evening and has been "very tight" since.    Pertinent History  Patient is 69 yo female that complains of lumbar pain and inability to walk erect, as well as hip/abdominal pain. Patient with history of lumbar fusion  (L2-S1). Xray results show: Fairly stable appearance of the levoscoliosis of the lower thoracic and of the lumbar spine.Stable lateral subluxation toward the left of L2 with respect L3. Stable partial compression of the body of L1. Patient complains of frustration with physicians, and that her symptoms are worsening over time. 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 Reported that her pain and difficulty worsened after trying to pick her husband up about 1 year ago. Patient mentioned that she has been told that she has "spinal fluid pockets that are leaking". PMH also includes DM I with insulin pump,  afib, HTN,  HLD, chronic pain syndrome, SOB.    Limitations  Standing;Walking;House hold activities;Other (comment)    Diagnostic tests  see exray results: Fairly stable appearance of the levoscoliosis of the lower thoracic and lumbar spine. Stable lateral subluxation toward left of L2 with respect of L3, stable partial compression of the body of L1.    Patient Stated Goals  pain relief, standing erect    Pain Onset  More than a month ago         Ther-Ex -[employer], simultaneous bilateral 1x5 minutes -concurrent with above, BUE wand flexion 1x20x5secH; some L shoulder pain with this, tolerable - Lower trunk  rotations x20 3-5sec hold with cuing to maintain mat contact with spine with good carry over - SKTC 2x 30sec hold bilat - Hooklying knee fallouts 2x 63min  - Working into childs pose with eventual 30sec hold, difficulty working into this position d/t "pulling" at R QL and lumbar paraspinals; working into L lateral childs pose with eventual 58min hold in this position with tactile stabilization - Over 3 mins working into [employer] with PT overpressure, with R UE overhead with difficulty obtaining position d/t decreased hip and lumbar mobility  - Sidelying open book x12  3-5sec holds for increased motion each side - Seated lumbar ext x10 with L  rotation x10   Following patient able to ambulate 252ft with difficulty with initiation, subsides with RLE leg swings, reports reduced pain following.                         PT Education - 02/22/19 1119    Education Details  therex form    Person(s) Educated  Patient    Methods  Explanation;Demonstration;Verbal cues    Comprehension  Verbalized understanding;Returned demonstration;Verbal cues required       PT Short Term Goals - 07/27/18 0933      PT SHORT TERM GOAL #1   Title  Patient will be adherent to initial HEP at least 3x a week to improve functional strength and balance for better safety at home.    Time  4    Period  Weeks    Status  Achieved    Target Date  12/17/17        PT Long Term Goals - 02/01/19 1131      PT LONG TERM GOAL #1   Title  Patient will be independent in bending down towards floor and picking up small object (<5 pounds) and then stand back up without increased pain to improve ability to pick up and clean up room at home    Baseline  09/25/18/20 able to pick up object without increase in pain, though she reports continued baseline pain, goal revised and achieved    Time  8    Period  Weeks    Status  Achieved      PT LONG TERM GOAL #2   Title  Patient will increase BLE gross strength to 4+/5 as to improve functional strength for independent gait, increased standing tolerance and increased ADL ability.    Baseline  08/31/18 R/L flexion: 5/5 ER 5/5 IR 4+/4+ ABD 5/5 Ext 4+/4    Time  8    Period  Weeks    Status  Achieved      PT LONG TERM GOAL #3   Title  Patient will report a worst pain of 5/10 with transfers of her husband to improve tolerance with ADLs     Baseline  02/01/19 7/10 pain with transfers in the LB, some increased L shoulder pain with transfers    Time  8    Period  Weeks    Status  On-going      PT LONG TERM GOAL #4   Title  Pt will decrease 5TSTS to 10sec order to demonstrate clinically significant improvement  in LE strength, and reduced fall risk    Baseline  02/01/19 10.76sec 12/07/18 17sec    Time  8    Period  Weeks    Status  On-going      PT LONG TERM GOAL #5   Title  Pt will  increase 10MWT by at least 0.13 m/s in order to demonstrate clinically significant improvement in community ambulation    Baseline  02/01/19 m/s 09/25/18 0.83 07/31/18 0.97m/s 07/20/18 same 06/10/18 0.42m/s    Time  8    Period  Weeks    Status  Achieved      PT LONG TERM GOAL #6   Title  Patient will demonstrate walk speed of 1.2 to demonstrate community ambulation norm    Baseline  02/01/19 0.59m/s 12/07/18 0.49m/s 10/12/18 0.49m/s 09/25/18 0.21m/s    Time  8    Period  Weeks    Status  On-going      PT LONG TERM GOAL #7   Title  Patient will increase standing tolerance to 5min without increased pain in order to complete household chores/cooking    Baseline  02/01/19 about 37mins before needing a break11/16/20 Can stand only stand for 15 mins d/t decreased tolerance    Time  8    Period  Weeks    Status  On-going      PT LONG TERM GOAL #8   Title  Patient will demonstrate SLS of 10sec to demonstrate decreased fall risk    Baseline  02/01/19 RLE 8.5sec  LLE 9sec 12/07/18 RLE 4sec LLE 4.2sec 11/19/18 RLE 4sec LLE 6sec    Time  8    Period  Weeks    Status  On-going            Plan - 02/22/19 1205    Clinical Impression Statement  PT spent session utilizing therex to increase lumbo-pelvic mobility, decrease muscular restriction and decrease pain;with success. PT was unable to progress ext strengthening d/t increased pain response this date, but was successful in utilizing therex to decrease pain, and increase patient independence. PT will continue progression as able.    Personal Factors and Comorbidities  Age;Behavior Pattern;Fitness;Past/Current Experience;Comorbidity 1;Comorbidity 2;Comorbidity 3+    Comorbidities  scolosis, DM1, chronic pain syndrome, HTN, afib, depression    Examination-Activity Limitations   Bathing;Dressing;Transfers;Caring for Others;Carry;Squat;Lift;Bend;Bed Mobility    Examination-Participation Restrictions  Yard Work;Laundry;Cleaning;Community Activity    Stability/Clinical Decision Making  Evolving/Moderate complexity    Rehab Potential  Fair    Clinical Impairments Affecting Rehab Potential  comorbidities, symptom duration, curvature degree    PT Frequency  2x / week    PT Duration  8 weeks    PT Treatment/Interventions  ADLs/Self Care Home Management;Aquatic Therapy;Cryotherapy;Ultrasound;Parrafin;Traction;Moist Heat;Electrical Stimulation;DME Instruction;Gait training;Stair training;Functional mobility training;Neuromuscular re-education;Balance training;Therapeutic exercise;Therapeutic activities;Patient/family education;Energy conservation;Spinal Manipulations;Joint Manipulations;Passive range of motion;Dry needling;Manual techniques    PT Next Visit Plan  Progress Hip and core strengthening as tolerated    PT Home Exercise Plan  Added standing pec stretch in doorway to HEP today    Consulted and Agree with Plan of Care  Patient       Patient will benefit from skilled therapeutic intervention in order to improve the following deficits and impairments:  Abnormal gait, Decreased balance, Decreased endurance, Decreased mobility, Difficulty walking, Hypomobility, Increased muscle spasms, Decreased range of motion, Pain, Postural dysfunction, Impaired flexibility, Increased fascial restricitons, Decreased strength, Decreased activity tolerance  Visit Diagnosis: Other idiopathic scoliosis, lumbar region  Radiculopathy, lumbar region     Problem List Patient Active Problem List   Diagnosis Date Noted  . Central sleep apnea 10/09/2016  . Hypertension 10/09/2016  . Lumbar postlaminectomy syndrome 10/09/2016  . Lumbar radiculopathy 10/09/2016  . Diabetic polyneuropathy ([place]) 10/07/2016  . Right lumbosacral radiculopathy 10/07/2016  . Healthcare maintenance 08/26/2016   .  DKA (diabetic ketoacidoses) ([place]) 06/14/2016  . Atypical chest pain 02/23/2016  . SOB (shortness of breath) 02/23/2016  . Pain medication agreement signed 01/13/2015  . Acquired hypothyroidism 05/30/2014  . Frontal sinusitis 12/17/2013  . Atrial fibrillation ([place]) 11/29/2013  . Major depression in remission ([place]) 11/20/2013  . 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 Chronic pain syndrome 08/27/2013  . Back pain 07/17/2013  . HTN (hypertension), benign 07/17/2013  . Hyperlipidemia 07/17/2013  . Pancreatic insufficiency 07/17/2013  . Chronic postoperative pain 01/01/2013  . Long term current use of opiate analgesic 11/04/2012  . Hypothyroidism 08/25/2012  . Radiculopathy of leg 05/01/2011   Shelton Silvas PT, DPT Shelton Silvas 02/22/2019, 12:17 PM  [facility] [address] [place], [address] Phone: [phone]   Fax:  [phone]  Name: Colleen Payne MRN: [id number] Date of Birth: 11/18/1950

## 2019-02-26 ENCOUNTER — Encounter: Payer: Self-pay | Admitting: Physical Therapy

## 2019-02-26 ENCOUNTER — Other Ambulatory Visit: Payer: Self-pay

## 2019-02-26 ENCOUNTER — Ambulatory Visit: Payer: Medicare Other | Admitting: Physical Therapy

## 2019-02-26 DIAGNOSIS — M4126 Other idiopathic scoliosis, lumbar region: Secondary | ICD-10-CM | POA: Diagnosis not present

## 2019-02-26 DIAGNOSIS — M5416 Radiculopathy, lumbar region: Secondary | ICD-10-CM

## 2019-02-26 NOTE — Therapy (Signed)
[facility] [address], [place], [address] Phone: [phone]   Fax:  [phone]  Physical Therapy Treatment  Patient Details  Name: Colleen Payne MRN: [id number] Date of Birth: [birth year] No data recorded  Encounter Date: 02/26/2019  PT End of Session - 02/26/19 1110    Visit Number  54    Number of Visits  13    Date for PT Re-Evaluation  03/26/19    Authorization Type  Medicare    Authorization Time Period  5/10    PT Start Time  1048    PT Stop Time  1129    PT Time Calculation (min)  41 min    Activity Tolerance  Patient tolerated treatment well    Behavior During Therapy  [facility] for tasks assessed/performed;Flat affect       Past Medical History:  Diagnosis Date  . Anxiety   . Atrial fibrillation ([place])   . Depression   . Diabetes mellitus without complication (HCC)    Type I   . Headache    stress. 1x/month  . Hypertension   . Insulin pump in place   . Motion sickness    cars  . MVP (mitral valve prolapse)   . Neuromuscular disorder (HCC)    leg weakness s/p back surgeries  . Thyroid disease     Past Surgical History:  Procedure Laterality Date  . BACK SURGERY  2004   3 sugeries between Sept and Nov, fusion L2-S1  . CATARACT EXTRACTION W/PHACO Left 11/23/2014   Procedure: CATARACT EXTRACTION PHACO AND INTRAOCULAR LENS PLACEMENT (IOC);  Surgeon: Leandrew Koyanagi, MD;  Location: [facility];  Service: Ophthalmology;  Laterality: Left;  DIABETIC - insulin pump    There were no vitals filed for this visit. [id number] Subjective Assessment - 02/26/19 1051    Subjective  Reports 7/10 R sided LBP that is still "catching". Reports [facility] in [facility] to relieve pain, and she cannot tell if they are helping.    Pertinent History  Patient is 69 yo female that complains of lumbar pain and inability to walk erect, as well as hip/abdominal pain. Patient with  history of lumbar fusion (L2-S1). Xray results show: Fairly stable appearance of the levoscoliosis of the lower thoracic and of the lumbar spine.Stable lateral subluxation toward the left of L2 with respect L3. Stable partial compression of the body of L1. Patient complains of frustration with physicians, and that her symptoms are worsening over time. 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 Reported that her pain and difficulty worsened after trying to pick her husband up about 1 year ago. Patient mentioned that she has been told that she has "spinal fluid pockets that are leaking". PMH also includes DM I with insulin pump,  afib, HTN,  HLD, chronic pain syndrome, SOB.    Limitations  Standing;Walking;House hold activities;Other (comment)    Diagnostic tests  see exray results: Fairly stable appearance of the levoscoliosis of the lower thoracic and lumbar spine. Stable lateral subluxation toward left of L2 with respect of L3, stable partial compression of the body of L1.    Patient Stated Goals  pain relief, standing erect    Pain Onset  More than a month ago    Pain Onset  More than a month ago       Ther-Ex -[employer], simultaneous bilateral 1x5 minutes -concurrent with above,  BUE wand flexion 1x20x5secH; some L shoulder pain with this, tolerable - Lower trunk rotations x20 3-5sec hold with cuing to maintain mat contact with spine with good carry over - SKTC 2x 30sec hold bilat - Hooklying knee fallouts 2x 19min - Childs pose x53min with L lateral bias x46min - Over 3 mins working into [employer] with PT overpressure, with R UE overhead with difficulty obtaining position d/t decreased hip and lumbar mobility  - LE swings (post/ant) x15 each side with unilateral UE support - LE diagonal swings hip flex + IR <> hip ext + ER x15 each LE  -[employer] pelvic rotations x12 each direction with cuing for lumbopelvic dissociation with good  success  Able to walk 256ft following without pain, with min cuing to "push away" from R lumbar C curve with good carry over                       PT Education - 02/26/19 1103    Education Details  therex form    Person(s) Educated  Patient    Methods  Explanation;Demonstration;Verbal cues    Comprehension  Verbalized understanding;Returned demonstration;Verbal cues required       PT Short Term Goals - 07/27/18 0933      PT SHORT TERM GOAL #1   Title  Patient will be adherent to initial HEP at least 3x a week to improve functional strength and balance for better safety at home.    Time  4    Period  Weeks    Status  Achieved    Target Date  12/17/17        PT Long Term Goals - 02/01/19 1131      PT LONG TERM GOAL #1   Title  Patient will be independent in bending down towards floor and picking up small object (<5 pounds) and then stand back up without increased pain to improve ability to pick up and clean up room at home    Baseline  09/25/18/20 able to pick up object without increase in pain, though she reports continued baseline pain, goal revised and achieved    Time  8    Period  Weeks    Status  Achieved      PT LONG TERM GOAL #2   Title  Patient will increase BLE gross strength to 4+/5 as to improve functional strength for independent gait, increased standing tolerance and increased ADL ability.    Baseline  08/31/18 R/L flexion: 5/5 ER 5/5 IR 4+/4+ ABD 5/5 Ext 4+/4    Time  8    Period  Weeks    Status  Achieved      PT LONG TERM GOAL #3   Title  Patient will report a worst pain of 5/10 with transfers of her husband to improve tolerance with ADLs     Baseline  02/01/19 7/10 pain with transfers in the LB, some increased L shoulder pain with transfers    Time  8    Period  Weeks    Status  On-going      PT LONG TERM GOAL #4   Title  Pt will decrease 5TSTS to 10sec order to demonstrate clinically significant improvement in LE strength, and reduced  fall risk    Baseline  02/01/19 10.76sec 12/07/18 17sec    Time  8    Period  Weeks    Status  On-going      PT LONG TERM GOAL #5  Title  Pt will increase 10MWT by at least 0.13 m/s in order to demonstrate clinically significant improvement in community ambulation    Baseline  02/01/19 m/s 09/25/18 0.83 07/31/18 0.46m/s 07/20/18 same 06/10/18 0.35m/s    Time  8    Period  Weeks    Status  Achieved      PT LONG TERM GOAL #6   Title  Patient will demonstrate walk speed of 1.2 to demonstrate community ambulation norm    Baseline  02/01/19 0.53m/s 12/07/18 0.65m/s 10/12/18 0.28m/s 09/25/18 0.34m/s    Time  8    Period  Weeks    Status  On-going      PT LONG TERM GOAL #7   Title  Patient will increase standing tolerance to 62min without increased pain in order to complete household chores/cooking    Baseline  02/01/19 about 25mins before needing a break11/16/20 Can stand only stand for 15 mins d/t decreased tolerance    Time  8    Period  Weeks    Status  On-going      PT LONG TERM GOAL #8   Title  Patient will demonstrate SLS of 10sec to demonstrate decreased fall risk    Baseline  02/01/19 RLE 8.5sec  LLE 9sec 12/07/18 RLE 4sec LLE 4.2sec 11/19/18 RLE 4sec LLE 6sec    Time  8    Period  Weeks    Status  On-going            Plan - 02/26/19 1127    Clinical Impression Statement  PT continued therex for increased lumbo-pelvic motion and disociation with decent progress. Patient is able to ambulate following interventions without "catching pain", but continues to report 7/10 pain overall. PT encouraged patient to complete mobility therex over weekend and utilize cane, patient verbalizes understanding. PT will continue progression as able.    Personal Factors and Comorbidities  Age;Behavior Pattern;Fitness;Past/Current Experience;Comorbidity 1;Comorbidity 2;Comorbidity 3+    Comorbidities  scolosis, DM1, chronic pain syndrome, HTN, afib, depression    Examination-Activity Limitations   Bathing;Dressing;Transfers;Caring for Others;Carry;Squat;Lift;Bend;Bed Mobility    Examination-Participation Restrictions  Yard Work;Laundry;Cleaning;Community Activity    Stability/Clinical Decision Making  Evolving/Moderate complexity    Clinical Decision Making  Moderate    Rehab Potential  Fair    Clinical Impairments Affecting Rehab Potential  comorbidities, symptom duration, curvature degree    PT Frequency  2x / week    PT Duration  8 weeks    PT Treatment/Interventions  ADLs/Self Care Home Management;Aquatic Therapy;Cryotherapy;Ultrasound;Parrafin;Traction;Moist Heat;Electrical Stimulation;DME Instruction;Gait training;Stair training;Functional mobility training;Neuromuscular re-education;Balance training;Therapeutic exercise;Therapeutic activities;Patient/family education;Energy conservation;Spinal Manipulations;Joint Manipulations;Passive range of motion;Dry needling;Manual techniques    PT Next Visit Plan  Progress Hip and core strengthening as tolerated    PT Home Exercise Plan  Added standing pec stretch in doorway to HEP today    Consulted and Agree with Plan of Care  Patient       Patient will benefit from skilled therapeutic intervention in order to improve the following deficits and impairments:  Abnormal gait, Decreased balance, Decreased endurance, Decreased mobility, Difficulty walking, Hypomobility, Increased muscle spasms, Decreased range of motion, Pain, Postural dysfunction, Impaired flexibility, Increased fascial restricitons, Decreased strength, Decreased activity tolerance  Visit Diagnosis: Other idiopathic scoliosis, lumbar region  Radiculopathy, lumbar region     Problem List Patient Active Problem List   Diagnosis Date Noted  . Central sleep apnea 10/09/2016  . Hypertension 10/09/2016  . Lumbar postlaminectomy syndrome 10/09/2016  . Lumbar radiculopathy 10/09/2016  . Diabetic polyneuropathy (  [place]) 10/07/2016  . Right lumbosacral radiculopathy 10/07/2016   . Healthcare maintenance 08/26/2016  . DKA (diabetic ketoacidoses) ([place]) 06/14/2016  . Atypical chest pain 02/23/2016  . SOB (shortness of breath) 02/23/2016  . Pain medication agreement signed 01/13/2015  . Acquired hypothyroidism 05/30/2014  . Frontal sinusitis 12/17/2013  . Atrial fibrillation ([place]) 11/29/2013  . Major depression in remission ([place]) 11/20/2013  . 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 Chronic pain syndrome 08/27/2013  . Back pain 07/17/2013  . HTN (hypertension), benign 07/17/2013  . Hyperlipidemia 07/17/2013  . Pancreatic insufficiency 07/17/2013  . Chronic postoperative pain 01/01/2013  . Long term current use of opiate analgesic 11/04/2012  . Hypothyroidism 08/25/2012  . Radiculopathy of leg 05/01/2011   Shelton Silvas PT, DPT Shelton Silvas 02/26/2019, 11:33 AM  [facility] [address] [place], [address] Phone: [phone]   Fax:  [phone]  Name: VITTORIA WAIT MRN: [id number] Date of Birth: February 11, 1950

## 2019-03-01 ENCOUNTER — Other Ambulatory Visit: Payer: Self-pay

## 2019-03-01 ENCOUNTER — Ambulatory Visit: Payer: Medicare Other | Admitting: Physical Therapy

## 2019-03-01 ENCOUNTER — Encounter: Payer: Self-pay | Admitting: Physical Therapy

## 2019-03-01 DIAGNOSIS — M5416 Radiculopathy, lumbar region: Secondary | ICD-10-CM

## 2019-03-01 DIAGNOSIS — M4126 Other idiopathic scoliosis, lumbar region: Secondary | ICD-10-CM | POA: Diagnosis not present

## 2019-03-01 NOTE — Therapy (Signed)
[facility] [address], [place], [address] Phone: [phone]   Fax:  [phone]  Physical Therapy Treatment  Patient Details  Name: Colleen Payne MRN: [id number] Date of Birth: 04-Dec-1950 No data recorded  Encounter Date: 03/01/2019  PT End of Session - 03/01/19 1129    Visit Number  55    Number of Visits  57    Date for PT Re-Evaluation  03/26/19    Authorization Type  Medicare    Authorization Time Period  6/10    PT Start Time  1121    PT Stop Time  1200    PT Time Calculation (min)  39 min    Activity Tolerance  Patient tolerated treatment well    Behavior During Therapy  [facility] for tasks assessed/performed;Flat affect       Past Medical History:  Diagnosis Date  . Anxiety   . Atrial fibrillation ([place])   . Depression   . Diabetes mellitus without complication (HCC)    Type I   . Headache    stress. 1x/month  . Hypertension   . Insulin pump in place   . Motion sickness    cars  . MVP (mitral valve prolapse)   . Neuromuscular disorder (HCC)    leg weakness s/p back surgeries  . Thyroid disease     Past Surgical History:  Procedure Laterality Date  . BACK SURGERY  2004   3 sugeries between Sept and Nov, fusion L2-S1  . CATARACT EXTRACTION W/PHACO Left 11/23/2014   Procedure: CATARACT EXTRACTION PHACO AND INTRAOCULAR LENS PLACEMENT (IOC);  Surgeon: Leandrew Koyanagi, MD;  Location: [facility];  Service: Ophthalmology;  Laterality: Left;  DIABETIC - insulin pump    There were no vitals filed for this visit.  Subjective Assessment - 03/01/19 1124    Subjective  Patient reports she is feeling better today, though still reports 7/10 pain. Reports she has not been having the "catch in her back" that made her stop walking last session, reports this is improving. Is having some increased numbness in the R lower leg, which she thinks is related to sciatica.    Pertinent History   Patient is 69 yo female that complains of lumbar pain and inability to walk erect, as well as hip/abdominal pain. Patient with history of lumbar fusion (L2-S1). Xray results show: Fairly stable appearance of the levoscoliosis of the lower thoracic and of the lumbar spine.Stable lateral subluxation toward the left of L2 with respect L3. Stable partial compression of the body of L1. Patient complains of frustration with physicians, and that her symptoms are worsening over time. 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 Reported that her pain and difficulty worsened after trying to pick her husband up about 1 year ago. Patient mentioned that she has been told that she has "spinal fluid pockets that are leaking". PMH also includes DM I with insulin pump,  afib, HTN,  HLD, chronic pain syndrome, SOB.    Limitations  Standing;Walking;House hold activities;Other (comment)    Diagnostic tests  see exray results: Fairly stable appearance of the levoscoliosis of the lower thoracic and lumbar spine. Stable lateral subluxation toward left of L2 with respect of L3, stable partial compression of the body of L1.    Patient Stated Goals  pain relief, standing erect    Pain Onset  More than a month ago    Pain Onset  More than a month ago         Ther-Ex -[employer], simultaneous bilateral 1x5 minutes -concurrent with above, BUE wand flexion 1x20x5secH; some L shoulder pain with this, tolerable - Lower trunk rotations x20 5sec hold - Prone alt supermans 3x 8 with rest taken in prone prop 7min between - Sidelying open book x12 3-5sec holds for increased motion - Slider hip ext 2x 10 each LE, with standing LE TC of ASIS against wall and TC for posture with good carry over - LE swings (post/ant) x15 each side with unilateral UE support - LE diagonal swings hip flex + IR <> hip ext + ER x15 each LE -Backward walking 1.64mph for 7mins, cuing for cuinng hip ext and  upright posture with good carry over with cuing                       PT Education - 03/01/19 1128    Education Details  therex form    Person(s) Educated  Patient    Methods  Explanation;Demonstration;Tactile cues;Verbal cues    Comprehension  Verbalized understanding;Returned demonstration;Verbal cues required;Tactile cues required       PT Short Term Goals - 07/27/18 0933      PT SHORT TERM GOAL #1   Title  Patient will be adherent to initial HEP at least 3x a week to improve functional strength and balance for better safety at home.    Time  4    Period  Weeks    Status  Achieved    Target Date  12/17/17        PT Long Term Goals - 02/01/19 1131      PT LONG TERM GOAL #1   Title  Patient will be independent in bending down towards floor and picking up small object (<5 pounds) and then stand back up without increased pain to improve ability to pick up and clean up room at home    Baseline  09/25/18/20 able to pick up object without increase in pain, though she reports continued baseline pain, goal revised and achieved    Time  8    Period  Weeks    Status  Achieved      PT LONG TERM GOAL #2   Title  Patient will increase BLE gross strength to 4+/5 as to improve functional strength for independent gait, increased standing tolerance and increased ADL ability.    Baseline  08/31/18 R/L flexion: 5/5 ER 5/5 IR 4+/4+ ABD 5/5 Ext 4+/4    Time  8    Period  Weeks    Status  Achieved      PT LONG TERM GOAL #3   Title  Patient will report a worst pain of 5/10 with transfers of her husband to improve tolerance with ADLs     Baseline  02/01/19 7/10 pain with transfers in the LB, some increased L shoulder pain with transfers    Time  8    Period  Weeks    Status  On-going      PT LONG TERM GOAL #4   Title  Pt will decrease 5TSTS to 10sec order to demonstrate clinically significant improvement in LE strength, and reduced fall risk    Baseline  02/01/19 10.76sec  12/07/18 17sec    Time  8    Period  Weeks    Status  On-going      PT LONG TERM GOAL #5   Title  Pt  will increase 10MWT by at least 0.13 m/s in order to demonstrate clinically significant improvement in community ambulation    Baseline  02/01/19 m/s 09/25/18 0.83 07/31/18 0.32m/s 07/20/18 same 06/10/18 0.75m/s    Time  8    Period  Weeks    Status  Achieved      PT LONG TERM GOAL #6   Title  Patient will demonstrate walk speed of 1.2 to demonstrate community ambulation norm    Baseline  02/01/19 0.22m/s 12/07/18 0.34m/s 10/12/18 0.19m/s 09/25/18 0.46m/s    Time  8    Period  Weeks    Status  On-going      PT LONG TERM GOAL #7   Title  Patient will increase standing tolerance to 26min without increased pain in order to complete household chores/cooking    Baseline  02/01/19 about 41mins before needing a break11/16/20 Can stand only stand for 15 mins d/t decreased tolerance    Time  8    Period  Weeks    Status  On-going      PT LONG TERM GOAL #8   Title  Patient will demonstrate SLS of 10sec to demonstrate decreased fall risk    Baseline  02/01/19 RLE 8.5sec  LLE 9sec 12/07/18 RLE 4sec LLE 4.2sec 11/19/18 RLE 4sec LLE 6sec    Time  8    Period  Weeks    Status  On-going            Plan - 03/01/19 1132    Clinical Impression Statement  PT continued therex progression for increased mobility and postural strengthening with good success. Patient is having less "catching" in the low back, allowing for increased progression d/t decreased pain response. Patient motivated throughout session and takes to cuing well with good corrections. PT will continue progression as able and pain mangement as needed.    Personal Factors and Comorbidities  Age;Behavior Pattern;Fitness;Past/Current Experience;Comorbidity 1;Comorbidity 2;Comorbidity 3+    Comorbidities  scolosis, DM1, chronic pain syndrome, HTN, afib, depression    Examination-Activity Limitations  Bathing;Dressing;Transfers;Caring for  Others;Carry;Squat;Lift;Bend;Bed Mobility    Examination-Participation Restrictions  Yard Work;Laundry;Cleaning;Community Activity    Stability/Clinical Decision Making  Evolving/Moderate complexity    Clinical Decision Making  Moderate    Rehab Potential  Fair    Clinical Impairments Affecting Rehab Potential  comorbidities, symptom duration, curvature degree    PT Frequency  2x / week    PT Duration  8 weeks    PT Treatment/Interventions  ADLs/Self Care Home Management;Aquatic Therapy;Cryotherapy;Ultrasound;Parrafin;Traction;Moist Heat;Electrical Stimulation;DME Instruction;Gait training;Stair training;Functional mobility training;Neuromuscular re-education;Balance training;Therapeutic exercise;Therapeutic activities;Patient/family education;Energy conservation;Spinal Manipulations;Joint Manipulations;Passive range of motion;Dry needling;Manual techniques    PT Next Visit Plan  Progress Hip and core strengthening as tolerated    PT Home Exercise Plan  Added standing pec stretch in doorway to HEP today    Consulted and Agree with Plan of Care  Patient       Patient will benefit from skilled therapeutic intervention in order to improve the following deficits and impairments:  Abnormal gait, Decreased balance, Decreased endurance, Decreased mobility, Difficulty walking, Hypomobility, Increased muscle spasms, Decreased range of motion, Pain, Postural dysfunction, Impaired flexibility, Increased fascial restricitons, Decreased strength, Decreased activity tolerance  Visit Diagnosis: Other idiopathic scoliosis, lumbar region  Radiculopathy, lumbar region     Problem List Patient Active Problem List   Diagnosis Date Noted  . Central sleep apnea 10/09/2016  . Hypertension 10/09/2016  . Lumbar postlaminectomy syndrome 10/09/2016  . Lumbar radiculopathy 10/09/2016  . Diabetic polyneuropathy (  [place]) 10/07/2016  . Right lumbosacral radiculopathy 10/07/2016  . Healthcare maintenance 08/26/2016   . DKA (diabetic ketoacidoses) ([place]) 06/14/2016  . Atypical chest pain 02/23/2016  . SOB (shortness of breath) 02/23/2016  . Pain medication agreement signed 01/13/2015  . Acquired hypothyroidism 05/30/2014  . Frontal sinusitis 12/17/2013  . Atrial fibrillation ([place]) 11/29/2013  . Major depression in remission ([place]) 11/20/2013  . 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 Chronic pain syndrome 08/27/2013  . Back pain 07/17/2013  . HTN (hypertension), benign 07/17/2013  . Hyperlipidemia 07/17/2013  . Pancreatic insufficiency 07/17/2013  . Chronic postoperative pain 01/01/2013  . Long term current use of opiate analgesic 11/04/2012  . Hypothyroidism 08/25/2012  . Radiculopathy of leg 05/01/2011   Shelton Silvas PT, DPT Shelton Silvas 03/01/2019, 11:55 AM  [facility] [address], [place], [address] Phone: [phone]   Fax:  [phone]  Name: ARIADNNE OTREMBA MRN: [id number] Date of Birth: 09-Oct-1950

## 2019-03-05 ENCOUNTER — Other Ambulatory Visit: Payer: Self-pay

## 2019-03-05 ENCOUNTER — Ambulatory Visit: Payer: Medicare Other | Admitting: Physical Therapy

## 2019-03-05 ENCOUNTER — Encounter: Payer: Self-pay | Admitting: Physical Therapy

## 2019-03-05 DIAGNOSIS — M4126 Other idiopathic scoliosis, lumbar region: Secondary | ICD-10-CM

## 2019-03-05 DIAGNOSIS — M5416 Radiculopathy, lumbar region: Secondary | ICD-10-CM

## 2019-03-05 NOTE — Therapy (Signed)
[facility] [address] [place], [address] Phone: [phone]   Fax:  [phone]  Physical Therapy Treatment  Patient Details  Name: Colleen Payne MRN: [id number] Date of Birth: 08-Feb-1950 No data recorded  Encounter Date: 03/05/2019  PT End of Session - 03/05/19 1130    Visit Number  56    Number of Visits  61    Date for PT Re-Evaluation  03/26/19    Authorization Type  Medicare    Authorization Time Period  7/10    PT Start Time  1054    PT Stop Time  1138    PT Time Calculation (min)  44 min    Activity Tolerance  Patient tolerated treatment well    Behavior During Therapy  [facility] for tasks assessed/performed;Flat affect       Past Medical History:  Diagnosis Date  . Anxiety   . Atrial fibrillation ([place])   . Depression   . Diabetes mellitus without complication (HCC)    Type I   . Headache    stress. 1x/month  . Hypertension   . Insulin pump in place   . Motion sickness    cars  . MVP (mitral valve prolapse)   . Neuromuscular disorder (HCC)    leg weakness s/p back surgeries  . Thyroid disease     Past Surgical History:  Procedure Laterality Date  . BACK SURGERY  2004   3 sugeries between Sept and Nov, fusion L2-S1  . CATARACT EXTRACTION W/PHACO Left 11/23/2014   Procedure: CATARACT EXTRACTION PHACO AND INTRAOCULAR LENS PLACEMENT (IOC);  Surgeon: Leandrew Koyanagi, MD;  Location: [facility];  Service: Ophthalmology;  Laterality: Left;  DIABETIC - insulin pump    There were no vitals filed for this visit.  Subjective Assessment - 03/05/19 1056    Subjective  Patient reports 7/10 today, but reports hardly any of the "catch" sensation she has been having, which is an improvement. No other changes    Pertinent History  Patient is 69 yo female that complains of lumbar pain and inability to walk erect, as well as hip/abdominal pain. Patient with history of lumbar fusion  (L2-S1). Xray results show: Fairly stable appearance of the levoscoliosis of the lower thoracic and of the lumbar spine.Stable lateral subluxation toward the left of L2 with respect L3. Stable partial compression of the body of L1. Patient complains of frustration with physicians, and that her symptoms are worsening over time. 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 Reported that her pain and difficulty worsened after trying to pick her husband up about 1 year ago. Patient mentioned that she has been told that she has "spinal fluid pockets that are leaking". PMH also includes DM I with insulin pump,  afib, HTN,  HLD, chronic pain syndrome, SOB.    Limitations  Standing;Walking;House hold activities;Other (comment)    Diagnostic tests  see exray results: Fairly stable appearance of the levoscoliosis of the lower thoracic and lumbar spine. Stable lateral subluxation toward left of L2 with respect of L3, stable partial compression of the body of L1.    Patient Stated Goals  pain relief, standing erect    Pain Onset  More than a month ago    Pain Onset  More than a month ago         Ther-Ex -[employer], simultaneous bilateral 1x5 minutes -concurrent with above, BUE wand flexion 1x20x5secH;  some L shoulder pain with this, tolerable - Lower trunk rotations x20 5sec hold - Prone alt supermans 3x 8 with rest taken in prone prop 66min between - Prone press up 3x 10 with TC stabilization at hips with good carry over - Half kneeling bilat UE flexion with wand with cuing for thoracic ext with decent carry over, increased difficulty when RLE is forward - LE swings (post/ant) x15 each side with unilateral UE support - LE diagonal swings hip flex + IR <>hip ext + ER x15 each LE -Backward walking 1.56mph for 63mins,cuing for cuinng hip ext and upright posture with good carry over with cuing                       PT Education - 03/05/19  1107    Education Details  therex form    Person(s) Educated  Patient    Methods  Explanation;Demonstration;Tactile cues;Verbal cues    Comprehension  Verbalized understanding;Returned demonstration;Verbal cues required;Tactile cues required       PT Short Term Goals - 07/27/18 0933      PT SHORT TERM GOAL #1   Title  Patient will be adherent to initial HEP at least 3x a week to improve functional strength and balance for better safety at home.    Time  4    Period  Weeks    Status  Achieved    Target Date  12/17/17        PT Long Term Goals - 02/01/19 1131      PT LONG TERM GOAL #1   Title  Patient will be independent in bending down towards floor and picking up small object (<5 pounds) and then stand back up without increased pain to improve ability to pick up and clean up room at home    Baseline  09/25/18/20 able to pick up object without increase in pain, though she reports continued baseline pain, goal revised and achieved    Time  8    Period  Weeks    Status  Achieved      PT LONG TERM GOAL #2   Title  Patient will increase BLE gross strength to 4+/5 as to improve functional strength for independent gait, increased standing tolerance and increased ADL ability.    Baseline  08/31/18 R/L flexion: 5/5 ER 5/5 IR 4+/4+ ABD 5/5 Ext 4+/4    Time  8    Period  Weeks    Status  Achieved      PT LONG TERM GOAL #3   Title  Patient will report a worst pain of 5/10 with transfers of her husband to improve tolerance with ADLs     Baseline  02/01/19 7/10 pain with transfers in the LB, some increased L shoulder pain with transfers    Time  8    Period  Weeks    Status  On-going      PT LONG TERM GOAL #4   Title  Pt will decrease 5TSTS to 10sec order to demonstrate clinically significant improvement in LE strength, and reduced fall risk    Baseline  02/01/19 10.76sec 12/07/18 17sec    Time  8    Period  Weeks    Status  On-going      PT LONG TERM GOAL #5   Title  Pt will increase  10MWT by at least 0.13 m/s in order to demonstrate clinically significant improvement in community ambulation    Baseline  02/01/19 m/s 09/25/18  0.83 07/31/18 0.64m/s 07/20/18 same 06/10/18 0.6m/s    Time  8    Period  Weeks    Status  Achieved      PT LONG TERM GOAL #6   Title  Patient will demonstrate walk speed of 1.2 to demonstrate community ambulation norm    Baseline  02/01/19 0.83m/s 12/07/18 0.81m/s 10/12/18 0.31m/s 09/25/18 0.62m/s    Time  8    Period  Weeks    Status  On-going      PT LONG TERM GOAL #7   Title  Patient will increase standing tolerance to 83min without increased pain in order to complete household chores/cooking    Baseline  02/01/19 about 72mins before needing a break11/16/20 Can stand only stand for 15 mins d/t decreased tolerance    Time  8    Period  Weeks    Status  On-going      PT LONG TERM GOAL #8   Title  Patient will demonstrate SLS of 10sec to demonstrate decreased fall risk    Baseline  02/01/19 RLE 8.5sec  LLE 9sec 12/07/18 RLE 4sec LLE 4.2sec 11/19/18 RLE 4sec LLE 6sec    Time  8    Period  Weeks    Status  On-going            Plan - 03/05/19 1135    Clinical Impression Statement  PT continued therex progression for upright posture, with success, and pain not inhibiting progression today. Patient is able to complete all therex with proper technique following cuing with good motivation throughout session. PT will continue progression as able; pain management as needed.    Personal Factors and Comorbidities  Age;Behavior Pattern;Fitness;Past/Current Experience;Comorbidity 1;Comorbidity 2;Comorbidity 3+    Comorbidities  scolosis, DM1, chronic pain syndrome, HTN, afib, depression    Examination-Activity Limitations  Bathing;Dressing;Transfers;Caring for Others;Carry;Squat;Lift;Bend;Bed Mobility    Examination-Participation Restrictions  Yard Work;Laundry;Cleaning;Community Activity    Stability/Clinical Decision Making  Evolving/Moderate complexity     Clinical Decision Making  Moderate    Rehab Potential  Fair    Clinical Impairments Affecting Rehab Potential  comorbidities, symptom duration, curvature degree    PT Frequency  2x / week    PT Duration  8 weeks    PT Treatment/Interventions  ADLs/Self Care Home Management;Aquatic Therapy;Cryotherapy;Ultrasound;Parrafin;Traction;Moist Heat;Electrical Stimulation;DME Instruction;Gait training;Stair training;Functional mobility training;Neuromuscular re-education;Balance training;Therapeutic exercise;Therapeutic activities;Patient/family education;Energy conservation;Spinal Manipulations;Joint Manipulations;Passive range of motion;Dry needling;Manual techniques    PT Next Visit Plan  Progress Hip and core strengthening as tolerated    PT Home Exercise Plan  Added standing pec stretch in doorway to HEP today    Consulted and Agree with Plan of Care  Patient       Patient will benefit from skilled therapeutic intervention in order to improve the following deficits and impairments:  Abnormal gait, Decreased balance, Decreased endurance, Decreased mobility, Difficulty walking, Hypomobility, Increased muscle spasms, Decreased range of motion, Pain, Postural dysfunction, Impaired flexibility, Increased fascial restricitons, Decreased strength, Decreased activity tolerance  Visit Diagnosis: Other idiopathic scoliosis, lumbar region  Radiculopathy, lumbar region     Problem List Patient Active Problem List   Diagnosis Date Noted  . Central sleep apnea 10/09/2016  . Hypertension 10/09/2016  . Lumbar postlaminectomy syndrome 10/09/2016  . Lumbar radiculopathy 10/09/2016  . Diabetic polyneuropathy ([place]) 10/07/2016  . Right lumbosacral radiculopathy 10/07/2016  . Healthcare maintenance 08/26/2016  . DKA (diabetic ketoacidoses) ([place]) 06/14/2016  . Atypical chest pain 02/23/2016  . SOB (shortness of breath) 02/23/2016  . Pain medication  agreement signed 01/13/2015  . Acquired hypothyroidism  05/30/2014  . Frontal sinusitis 12/17/2013  . Atrial fibrillation ([place]) 11/29/2013  . Major depression in remission ([place]) 11/20/2013  . 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 Chronic pain syndrome 08/27/2013  . Back pain 07/17/2013  . HTN (hypertension), benign 07/17/2013  . Hyperlipidemia 07/17/2013  . Pancreatic insufficiency 07/17/2013  . Chronic postoperative pain 01/01/2013  . Long term current use of opiate analgesic 11/04/2012  . Hypothyroidism 08/25/2012  . Radiculopathy of leg 05/01/2011   Shelton Silvas PT, DPT Shelton Silvas 03/05/2019, 11:38 AM  [facility] [address], [place], [address] Phone: [phone]   Fax:  [phone]  Name: PEMBERLEY MASARIK MRN: [id number] Date of Birth: 1950/06/01

## 2019-03-08 ENCOUNTER — Ambulatory Visit: Payer: Medicare Other | Admitting: Physical Therapy

## 2019-03-12 ENCOUNTER — Other Ambulatory Visit: Payer: Self-pay

## 2019-03-12 ENCOUNTER — Encounter: Payer: Self-pay | Admitting: Physical Therapy

## 2019-03-12 ENCOUNTER — Ambulatory Visit: Payer: Medicare Other | Admitting: Physical Therapy

## 2019-03-12 DIAGNOSIS — M4126 Other idiopathic scoliosis, lumbar region: Secondary | ICD-10-CM

## 2019-03-12 DIAGNOSIS — M5416 Radiculopathy, lumbar region: Secondary | ICD-10-CM

## 2019-03-12 NOTE — Therapy (Signed)
[name] [address], [place], [address] Phone: [phone]   Fax:  [phone]  Physical Therapy Treatment  Patient Details  Name: Colleen Payne MRN: [id number] Date of Birth: [birth year] No data recorded  Encounter Date: 03/12/2019  PT End of Session - 03/12/19 1051    Visit Number  29    Number of Visits  95    Date for PT Re-Evaluation  03/26/19    Authorization Type  Medicare    Authorization Time Period  8/10    PT Start Time  1047    PT Stop Time  1125    PT Time Calculation (min)  38 min    Activity Tolerance  Patient tolerated treatment well    Behavior During Therapy  [facility] for tasks assessed/performed;Flat affect       Past Medical History:  Diagnosis Date  . Anxiety   . Atrial fibrillation ([place])   . Depression   . Diabetes mellitus without complication (HCC)    Type I   . Headache    stress. 1x/month  . Hypertension   . Insulin pump in place   . Motion sickness    cars  . MVP (mitral valve prolapse)   . Neuromuscular disorder (HCC)    leg weakness s/p back surgeries  . Thyroid disease     Past Surgical History:  Procedure Laterality Date  . BACK SURGERY  2004   3 sugeries between Sept and Nov, fusion L2-S1  . CATARACT EXTRACTION W/PHACO Left 11/23/2014   Procedure: CATARACT EXTRACTION PHACO AND INTRAOCULAR LENS PLACEMENT (IOC);  Surgeon: Leandrew Koyanagi, MD;  Location: [place];  Service: Ophthalmology;  Laterality: Left;  DIABETIC - insulin pump    There were no vitals filed for this visit.  Subjective Assessment - 03/12/19 1049    Subjective  Reports she is still not having catching in her back, which is an improvement. Reports 8/10 pain overall, increased mobility, but thinks pain is increased today d/t weather.    Pertinent History  Patient is 69 yo female that complains of lumbar pain and inability to walk erect, as well as hip/abdominal pain. Patient  with history of lumbar fusion (L2-S1). Xray results show: Fairly stable appearance of the levoscoliosis of the lower thoracic and of the lumbar spine.Stable lateral subluxation toward the left of L2 with respect L3. Stable partial compression of the body of L1. Patient complains of frustration with physicians, and that her symptoms are worsening over time. 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 Reported that her pain and difficulty worsened after trying to pick her husband up about 1 year ago. Patient mentioned that she has been told that she has "spinal fluid pockets that are leaking". PMH also includes DM I with insulin pump,  afib, HTN,  HLD, chronic pain syndrome, SOB.    Limitations  Standing;Walking;House hold activities;Other (comment)    Diagnostic tests  see exray results: Fairly stable appearance of the levoscoliosis of the lower thoracic and lumbar spine. Stable lateral subluxation toward left of L2 with respect of L3, stable partial compression of the body of L1.    Patient Stated Goals  pain relief, standing erect    Pain Onset  More than a month ago          Ther-Ex -[employer], simultaneous bilateral 1x5 minutes -concurrent with above, BUE wand flexion 1x20x5secH; some L shoulder pain  -  Prone press up 3x 10 with TC stabilization at hips with good carry over; rest between sets in prone prop - Alt birddogs 3x 12 with some TC needed for stabilization and full available hip and knee ext with good carry over; childs pose during rest 3min - Good morning/hip hinge with overhead PVC pipe 2x 8 with consistent multimodal cuing with good carry over when cuing is present - Squat with 5# DB in front 2x 8 with demo and heavy cuing for posture and scapular retraction to maintian upright posture against resistance with decent carry over - LE swings (post/ant) x15 each side with unilateral UE support - LE diagonal swings hip flex + IR <>hip  ext + ER x15 each LE -Backward walking 0.13mph for4.14mins,cuing for foot clearance, esp LLE, and upright posture with good carry over                      PT Education - 03/12/19 1051    Education Details  therex form    Person(s) Educated  Patient    Methods  Explanation;Demonstration;Verbal cues;Tactile cues    Comprehension  Verbalized understanding;Returned demonstration;Verbal cues required;Tactile cues required       PT Short Term Goals - 07/27/18 0933      PT SHORT TERM GOAL #1   Title  Patient will be adherent to initial HEP at least 3x a week to improve functional strength and balance for better safety at home.    Time  4    Period  Weeks    Status  Achieved    Target Date  12/17/17        PT Long Term Goals - 02/01/19 1131      PT LONG TERM GOAL #1   Title  Patient will be independent in bending down towards floor and picking up small object (<5 pounds) and then stand back up without increased pain to improve ability to pick up and clean up room at home    Baseline  09/25/18/20 able to pick up object without increase in pain, though she reports continued baseline pain, goal revised and achieved    Time  8    Period  Weeks    Status  Achieved      PT LONG TERM GOAL #2   Title  Patient will increase BLE gross strength to 4+/5 as to improve functional strength for independent gait, increased standing tolerance and increased ADL ability.    Baseline  08/31/18 R/L flexion: 5/5 ER 5/5 IR 4+/4+ ABD 5/5 Ext 4+/4    Time  8    Period  Weeks    Status  Achieved      PT LONG TERM GOAL #3   Title  Patient will report a worst pain of 5/10 with transfers of her husband to improve tolerance with ADLs     Baseline  02/01/19 7/10 pain with transfers in the LB, some increased L shoulder pain with transfers    Time  8    Period  Weeks    Status  On-going      PT LONG TERM GOAL #4   Title  Pt will decrease 5TSTS to 10sec order to demonstrate clinically  significant improvement in LE strength, and reduced fall risk    Baseline  02/01/19 10.76sec 12/07/18 17sec    Time  8    Period  Weeks    Status  On-going      PT LONG TERM GOAL #5  Title  Pt will increase 10MWT by at least 0.13 m/s in order to demonstrate clinically significant improvement in community ambulation    Baseline  02/01/19 m/s 09/25/18 0.83 07/31/18 0.56m/s 07/20/18 same 06/10/18 0.45m/s    Time  8    Period  Weeks    Status  Achieved      PT LONG TERM GOAL #6   Title  Patient will demonstrate walk speed of 1.2 to demonstrate community ambulation norm    Baseline  02/01/19 0.58m/s 12/07/18 0.39m/s 10/12/18 0.19m/s 09/25/18 0.29m/s    Time  8    Period  Weeks    Status  On-going      PT LONG TERM GOAL #7   Title  Patient will increase standing tolerance to 47min without increased pain in order to complete household chores/cooking    Baseline  02/01/19 about 32mins before needing a break11/16/20 Can stand only stand for 15 mins d/t decreased tolerance    Time  8    Period  Weeks    Status  On-going      PT LONG TERM GOAL #8   Title  Patient will demonstrate SLS of 10sec to demonstrate decreased fall risk    Baseline  02/01/19 RLE 8.5sec  LLE 9sec 12/07/18 RLE 4sec LLE 4.2sec 11/19/18 RLE 4sec LLE 6sec    Time  8    Period  Weeks    Status  On-going            Plan - 03/12/19 1105    Clinical Impression Statement  Patient with increased mobility today from previous sessions. Though patient reports increased numerical pain, she did  well completing and tolerating therex progression, with good motivation thorughout session. PT is able to progress therex with functional movements d/t increased tolerance of therex, with good success. Patient does well complying with cuing for technique. PT will continue progression as able.    Personal Factors and Comorbidities  Age;Behavior Pattern;Fitness;Past/Current Experience;Comorbidity 1;Comorbidity 2;Comorbidity 3+    Comorbidities   scolosis, DM1, chronic pain syndrome, HTN, afib, depression    Examination-Activity Limitations  Bathing;Dressing;Transfers;Caring for Others;Carry;Squat;Lift;Bend;Bed Mobility    Examination-Participation Restrictions  Yard Work;Laundry;Cleaning;Community Activity    Stability/Clinical Decision Making  Evolving/Moderate complexity    Clinical Decision Making  Moderate    Rehab Potential  Fair    Clinical Impairments Affecting Rehab Potential  comorbidities, symptom duration, curvature degree    PT Frequency  2x / week    PT Duration  8 weeks    PT Treatment/Interventions  ADLs/Self Care Home Management;Aquatic Therapy;Cryotherapy;Ultrasound;Parrafin;Traction;Moist Heat;Electrical Stimulation;DME Instruction;Gait training;Stair training;Functional mobility training;Neuromuscular re-education;Balance training;Therapeutic exercise;Therapeutic activities;Patient/family education;Energy conservation;Spinal Manipulations;Joint Manipulations;Passive range of motion;Dry needling;Manual techniques    PT Next Visit Plan  Progress Hip and core strengthening as tolerated    PT Home Exercise Plan  Added standing pec stretch in doorway to HEP today    Consulted and Agree with Plan of Care  Patient       Patient will benefit from skilled therapeutic intervention in order to improve the following deficits and impairments:  Abnormal gait, Decreased balance, Decreased endurance, Decreased mobility, Difficulty walking, Hypomobility, Increased muscle spasms, Decreased range of motion, Pain, Postural dysfunction, Impaired flexibility, Increased fascial restricitons, Decreased strength, Decreased activity tolerance  Visit Diagnosis: Other idiopathic scoliosis, lumbar region  Radiculopathy, lumbar region     Problem List Patient Active Problem List   Diagnosis Date Noted  . Central sleep apnea 10/09/2016  . Hypertension 10/09/2016  . Lumbar postlaminectomy syndrome 10/09/2016  .  Lumbar radiculopathy  10/09/2016  . Diabetic polyneuropathy ([place]) 10/07/2016  . Right lumbosacral radiculopathy 10/07/2016  . Healthcare maintenance 08/26/2016  . DKA (diabetic ketoacidoses) ([place]) 06/14/2016  . Atypical chest pain 02/23/2016  . SOB (shortness of breath) 02/23/2016  . Pain medication agreement signed 01/13/2015  . Acquired hypothyroidism 05/30/2014  . Frontal sinusitis 12/17/2013  . Atrial fibrillation ([place]) 11/29/2013  . Major depression in remission ([place]) 11/20/2013  . 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 Chronic pain syndrome 08/27/2013  . Back pain 07/17/2013  . HTN (hypertension), benign 07/17/2013  . Hyperlipidemia 07/17/2013  . Pancreatic insufficiency 07/17/2013  . Chronic postoperative pain 01/01/2013  . Long term current use of opiate analgesic 11/04/2012  . Hypothyroidism 08/25/2012  . Radiculopathy of leg 05/01/2011   Shelton Silvas PT, DPT Shelton Silvas 03/12/2019, 11:26 AM  [facility] [address] [place], [address] Phone: [phone]   Fax:  [phone]  Name: Colleen Payne MRN: [id number] Date of Birth: Feb 02, 1950

## 2019-03-15 ENCOUNTER — Ambulatory Visit: Payer: Medicare Other | Admitting: Physical Therapy

## 2019-03-15 ENCOUNTER — Encounter: Payer: Self-pay | Admitting: Physical Therapy

## 2019-03-15 ENCOUNTER — Other Ambulatory Visit: Payer: Self-pay

## 2019-03-15 DIAGNOSIS — M4126 Other idiopathic scoliosis, lumbar region: Secondary | ICD-10-CM

## 2019-03-15 DIAGNOSIS — M5416 Radiculopathy, lumbar region: Secondary | ICD-10-CM

## 2019-03-15 NOTE — Therapy (Signed)
[facility] [address], [place], [address] Phone: [phone]   Fax:  [phone]  Physical Therapy Treatment  Patient Details  Name: Colleen Payne MRN: [id number] Date of Birth: [birth year] No data recorded  Encounter Date: 03/15/2019  PT End of Session - 03/15/19 1121    Visit Number  33    Number of Visits  17    Date for PT Re-Evaluation  03/26/19    Authorization Type  Medicare    Authorization Time Period  9/10    PT Start Time  1115    PT Stop Time  1155    PT Time Calculation (min)  40 min    Activity Tolerance  Patient tolerated treatment well    Behavior During Therapy  [facility] for tasks assessed/performed;Flat affect       Past Medical History:  Diagnosis Date  . Anxiety   . Atrial fibrillation ([place])   . Depression   . Diabetes mellitus without complication (HCC)    Type I   . Headache    stress. 1x/month  . Hypertension   . Insulin pump in place   . Motion sickness    cars  . MVP (mitral valve prolapse)   . Neuromuscular disorder (HCC)    leg weakness s/p back surgeries  . Thyroid disease     Past Surgical History:  Procedure Laterality Date  . BACK SURGERY  2004   3 sugeries between Sept and Nov, fusion L2-S1  . CATARACT EXTRACTION W/PHACO Left 11/23/2014   Procedure: CATARACT EXTRACTION PHACO AND INTRAOCULAR LENS PLACEMENT (IOC);  Surgeon: Leandrew Koyanagi, MD;  Location: [facility];  Service: Ophthalmology;  Laterality: Left;  DIABETIC - insulin pump    There were no vitals filed for this visit.  Subjective Assessment - 03/15/19 1118    Subjective  Reports she had family over this weekend which was nice. She continues to report no catching pain, but that she has been sore and that she feels like her back muscles are tiring quickly when she tried to "hold herself up"    Pertinent History  Patient is 69 yo female that complains of lumbar pain and inability to walk  erect, as well as hip/abdominal pain. Patient with history of lumbar fusion (L2-S1). Xray results show: Fairly stable appearance of the levoscoliosis of the lower thoracic and of the lumbar spine.Stable lateral subluxation toward the left of L2 with respect L3. Stable partial compression of the body of L1. Patient complains of frustration with physicians, and that her symptoms are worsening over time. 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 Reported that her pain and difficulty worsened after trying to pick her husband up about 1 year ago. Patient mentioned that she has been told that she has "spinal fluid pockets that are leaking". PMH also includes DM I with insulin pump,  afib, HTN,  HLD, chronic pain syndrome, SOB.    Limitations  Standing;Walking;House hold activities;Other (comment)    Diagnostic tests  see exray results: Fairly stable appearance of the levoscoliosis of the lower thoracic and lumbar spine. Stable lateral subluxation toward left of L2 with respect of L3, stable partial compression of the body of L1.    Patient Stated Goals  pain relief, standing erect    Pain Onset  More than a month ago       Ther-Ex -[employer], simultaneous bilateral 1x5 minutes -concurrent with  above, BUE wand flexion 1x20x5secH; some L shoulder pain  -Prone press up 3x 12 with TC stabilization at hips with good carry over; rest between sets in prone prop - Bering Sorenson position 3x 10/15/20sec hold with tactile stabilization at LEs - Half kneeling trunk rotations x12 each direction each LE forward with TC to maintain balance, good carry over of postural cuing - Half kneeling rows GTB x12 each LE forward with cuing for scapular retraction and TC needed for postural control with good carry over - LE swings (post/ant) x15 each side with unilateral UE support - LE diagonal swings hip flex + IR <>hip ext + ER x15 each LE -Backward walking 1.2mph  for4.63mins,cuing for foot clearance, esp LLE, and upright posture with good carry over                        PT Education - 03/15/19 1120    Education Details  therex form    Person(s) Educated  Patient    Methods  Explanation;Demonstration;Verbal cues    Comprehension  Verbalized understanding;Returned demonstration;Verbal cues required       PT Short Term Goals - 07/27/18 0933      PT SHORT TERM GOAL #1   Title  Patient will be adherent to initial HEP at least 3x a week to improve functional strength and balance for better safety at home.    Time  4    Period  Weeks    Status  Achieved    Target Date  12/17/17        PT Long Term Goals - 02/01/19 1131      PT LONG TERM GOAL #1   Title  Patient will be independent in bending down towards floor and picking up small object (<5 pounds) and then stand back up without increased pain to improve ability to pick up and clean up room at home    Baseline  09/25/18/20 able to pick up object without increase in pain, though she reports continued baseline pain, goal revised and achieved    Time  8    Period  Weeks    Status  Achieved      PT LONG TERM GOAL #2   Title  Patient will increase BLE gross strength to 4+/5 as to improve functional strength for independent gait, increased standing tolerance and increased ADL ability.    Baseline  08/31/18 R/L flexion: 5/5 ER 5/5 IR 4+/4+ ABD 5/5 Ext 4+/4    Time  8    Period  Weeks    Status  Achieved      PT LONG TERM GOAL #3   Title  Patient will report a worst pain of 5/10 with transfers of her husband to improve tolerance with ADLs     Baseline  02/01/19 7/10 pain with transfers in the LB, some increased L shoulder pain with transfers    Time  8    Period  Weeks    Status  On-going      PT LONG TERM GOAL #4   Title  Pt will decrease 5TSTS to 10sec order to demonstrate clinically significant improvement in LE strength, and reduced fall risk    Baseline  02/01/19  10.76sec 12/07/18 17sec    Time  8    Period  Weeks    Status  On-going      PT LONG TERM GOAL #5   Title  Pt will increase 10MWT by at least 0.13 m/s  in order to demonstrate clinically significant improvement in community ambulation    Baseline  02/01/19 m/s 09/25/18 0.83 07/31/18 0.84m/s 07/20/18 same 06/10/18 0.68m/s    Time  8    Period  Weeks    Status  Achieved      PT LONG TERM GOAL #6   Title  Patient will demonstrate walk speed of 1.2 to demonstrate community ambulation norm    Baseline  02/01/19 0.97m/s 12/07/18 0.70m/s 10/12/18 0.73m/s 09/25/18 0.29m/s    Time  8    Period  Weeks    Status  On-going      PT LONG TERM GOAL #7   Title  Patient will increase standing tolerance to 93min without increased pain in order to complete household chores/cooking    Baseline  02/01/19 about 35mins before needing a break11/16/20 Can stand only stand for 15 mins d/t decreased tolerance    Time  8    Period  Weeks    Status  On-going      PT LONG TERM GOAL #8   Title  Patient will demonstrate SLS of 10sec to demonstrate decreased fall risk    Baseline  02/01/19 RLE 8.5sec  LLE 9sec 12/07/18 RLE 4sec LLE 4.2sec 11/19/18 RLE 4sec LLE 6sec    Time  8    Period  Weeks    Status  On-going            Plan - 03/15/19 1141    Clinical Impression Statement  PT utilized increased reps/holds/time for therex to increase endurance demand of slow twitch muscle activation with good success. Patient is motivated throughout session and is able to comply with most cuing for posture and technique with good carry over. PT will continue progression as able.    Personal Factors and Comorbidities  Age;Behavior Pattern;Fitness;Past/Current Experience;Comorbidity 1;Comorbidity 2;Comorbidity 3+    Comorbidities  scolosis, DM1, chronic pain syndrome, HTN, afib, depression    Examination-Activity Limitations  Bathing;Dressing;Transfers;Caring for Others;Carry;Squat;Lift;Bend;Bed Mobility    Examination-Participation  Restrictions  Yard Work;Laundry;Cleaning;Community Activity    Stability/Clinical Decision Making  Evolving/Moderate complexity    Clinical Decision Making  Moderate    Rehab Potential  Fair    Clinical Impairments Affecting Rehab Potential  comorbidities, symptom duration, curvature degree    PT Frequency  2x / week    PT Duration  8 weeks    PT Treatment/Interventions  ADLs/Self Care Home Management;Aquatic Therapy;Cryotherapy;Ultrasound;Parrafin;Traction;Moist Heat;Electrical Stimulation;DME Instruction;Gait training;Stair training;Functional mobility training;Neuromuscular re-education;Balance training;Therapeutic exercise;Therapeutic activities;Patient/family education;Energy conservation;Spinal Manipulations;Joint Manipulations;Passive range of motion;Dry needling;Manual techniques    PT Next Visit Plan  Progress Hip and core strengthening as tolerated    PT Home Exercise Plan  Added standing pec stretch in doorway to HEP today    Consulted and Agree with Plan of Care  Patient       Patient will benefit from skilled therapeutic intervention in order to improve the following deficits and impairments:  Abnormal gait, Decreased balance, Decreased endurance, Decreased mobility, Difficulty walking, Hypomobility, Increased muscle spasms, Decreased range of motion, Pain, Postural dysfunction, Impaired flexibility, Increased fascial restricitons, Decreased strength, Decreased activity tolerance  Visit Diagnosis: Other idiopathic scoliosis, lumbar region  Radiculopathy, lumbar region     Problem List Patient Active Problem List   Diagnosis Date Noted  . Central sleep apnea 10/09/2016  . Hypertension 10/09/2016  . Lumbar postlaminectomy syndrome 10/09/2016  . Lumbar radiculopathy 10/09/2016  . Diabetic polyneuropathy ([place]) 10/07/2016  . Right lumbosacral radiculopathy 10/07/2016  . Healthcare maintenance 08/26/2016  . DKA (  diabetic ketoacidoses) ([place]) 06/14/2016  . Atypical chest pain  02/23/2016  . SOB (shortness of breath) 02/23/2016  . Pain medication agreement signed 01/13/2015  . Acquired hypothyroidism 05/30/2014  . Frontal sinusitis 12/17/2013  . Atrial fibrillation ([place]) 11/29/2013  . Major depression in remission ([place]) 11/20/2013  . 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 Chronic pain syndrome 08/27/2013  . Back pain 07/17/2013  . HTN (hypertension), benign 07/17/2013  . Hyperlipidemia 07/17/2013  . Pancreatic insufficiency 07/17/2013  . Chronic postoperative pain 01/01/2013  . Long term current use of opiate analgesic 11/04/2012  . Hypothyroidism 08/25/2012  . Radiculopathy of leg 05/01/2011   Shelton Silvas PT, DPT Shelton Silvas 03/15/2019, 11:54 AM  [facility] [address] [place], [address] Phone: [phone]   Fax:  [phone]  Name: Colleen Payne MRN: [id number] Date of Birth: 1950-01-22

## 2019-03-19 ENCOUNTER — Encounter: Payer: Self-pay | Admitting: Physical Therapy

## 2019-03-19 ENCOUNTER — Ambulatory Visit: Payer: Medicare Other | Admitting: Physical Therapy

## 2019-03-19 ENCOUNTER — Other Ambulatory Visit: Payer: Self-pay

## 2019-03-19 DIAGNOSIS — M4126 Other idiopathic scoliosis, lumbar region: Secondary | ICD-10-CM

## 2019-03-19 DIAGNOSIS — M5416 Radiculopathy, lumbar region: Secondary | ICD-10-CM

## 2019-03-19 NOTE — Therapy (Signed)
[facility] [address], [place], [address] Phone: [phone]   Fax:  [phone]  Physical Therapy Treatment/Progress Note Reporting Period 02/01/19 - 03/19/19  Patient Details  Name: Colleen Payne MRN: [id number] Date of Birth: [birth year] No data recorded  Encounter Date: 03/19/2019  PT End of Session - 03/19/19 1050    Visit Number  36    Number of Visits  63    Authorization Type  Medicare    Authorization Time Period  10/10    PT Start Time  1048    PT Stop Time  1130    PT Time Calculation (min)  42 min    Activity Tolerance  Patient tolerated treatment well    Behavior During Therapy  [facility] for tasks assessed/performed;Flat affect       Past Medical History:  Diagnosis Date  . Anxiety   . Atrial fibrillation ([place])   . Depression   . Diabetes mellitus without complication (HCC)    Type I   . Headache    stress. 1x/month  . Hypertension   . Insulin pump in place   . Motion sickness    cars  . MVP (mitral valve prolapse)   . Neuromuscular disorder (HCC)    leg weakness s/p back surgeries  . Thyroid disease     Past Surgical History:  Procedure Laterality Date  . BACK SURGERY  2004   3 sugeries between Sept and Nov, fusion L2-S1  . CATARACT EXTRACTION W/PHACO Left 11/23/2014   Procedure: CATARACT EXTRACTION PHACO AND INTRAOCULAR LENS PLACEMENT (IOC);  Surgeon: Leandrew Koyanagi, MD;  Location: [facility];  Service: Ophthalmology;  Laterality: Left;  DIABETIC - insulin pump    There were no vitals filed for this visit.    Ther-Ex -Thomas Test Stretch, simultaneous bilateral 1x5 minutes -concurrent with above, BUE wand flexion 1x20x5secH; some L shoulder pain  - Prone alt supermans 3x 8/910 each direction with min carry over for full ext with good carry over  - Hooklying lumbar rotaitons x20 with 5sec hold with increased stretch felt with L rotation - Bridge 3x 10 with  cuing for full hip ext with good carry over - 5xSTS x2 trials 1: 11sec 2: 10.6sec -SLS 3 trials bilat best time L 8.3sec  R 9.28sec - LE swings (post/ant) x15 each side with unilateral UE support - LE diagonal swings hip flex + IR <>hip ext + ER x15 each LE -Backward walking1.84mph for4.14mins,cuing forfoot clearance, esp LLE, and upright posture with good carry over  10MWT 2 trials best time 10.06 or 1.37m/s                      PT Education - 03/19/19 1050    Education Details  therex form, posture    Person(s) Educated  Patient    Methods  Explanation;Demonstration;Verbal cues    Comprehension  Verbalized understanding;Returned demonstration;Verbal cues required       PT Short Term Goals - 07/27/18 0933      PT SHORT TERM GOAL #1   Title  Patient will be adherent to initial HEP at least 3x a week to improve functional strength and balance for better safety at home.    Time  4    Period  Weeks    Status  Achieved    Target Date  12/17/17        PT Long Term Goals - 03/19/19 1051  PT LONG TERM GOAL #1   Title  Patient will be independent in bending down towards floor and picking up small object (<5 pounds) and then stand back up without increased pain to improve ability to pick up and clean up room at home    Baseline  09/25/18/20 able to pick up object without increase in pain, though she reports continued baseline pain, goal revised and achieved    Time  8    Period  Weeks    Status  Achieved      PT LONG TERM GOAL #2   Title  Patient will increase BLE gross strength to 4+/5 as to improve functional strength for independent gait, increased standing tolerance and increased ADL ability.    Baseline  08/31/18 R/L flexion: 5/5 ER 5/5 IR 4+/4+ ABD 5/5 Ext 4+/4    Time  8    Period  Weeks    Status  Achieved      PT LONG TERM GOAL #3   Title  Patient will report a worst pain of 5/10 with transfers of her husband to improve tolerance with ADLs      Baseline  02/01/19 7/10 pain with transfers in the LB, some increased L shoulder pain with transfers; 03/19/19 continued 7/10 pain with transfers    Time  8    Period  Weeks    Status  On-going      PT LONG TERM GOAL #4   Title  Pt will decrease 5TSTS to 10sec order to demonstrate clinically significant improvement in LE strength, and reduced fall risk    Baseline  03/19/19 10.6sec 02/01/19 10.76sec 12/07/18 17sec    Time  8    Period  Weeks    Status  On-going      PT LONG TERM GOAL #5   Title  Pt will increase 10MWT by at least 0.13 m/s in order to demonstrate clinically significant improvement in community ambulation    Baseline  02/01/19 m/s 09/25/18 0.83 07/31/18 0.54m/s 07/20/18 same 06/10/18 0.54m/s    Time  8    Period  Weeks    Status  Achieved      PT LONG TERM GOAL #6   Title  Patient will demonstrate walk speed of 1.2 to demonstrate community ambulation norm    Baseline  03/19/19 1.76m/s 02/01/19 0.93m/s 12/07/18 0.37m/s 10/12/18 0.16m/s 09/25/18 0.7m/s    Time  8    Period  Weeks    Status  On-going      PT LONG TERM GOAL #7   Title  Patient will increase standing tolerance to 43min without increased pain in order to complete household chores/cooking    Baseline  03/19/19 can stand about 9mins before needing to sit for a break 02/01/19 about 38mins before needing a break11/16/20 Can stand only stand for 15 mins d/t decreased tolerance    Time  8    Period  Weeks    Status  On-going      PT LONG TERM GOAL #8   Title  Patient will demonstrate SLS of 10sec to demonstrate decreased fall risk    Baseline  03/19/19 RLE 9.28 LLE 8.5 02/01/19 RLE 8.5sec  LLE 9sec 12/07/18 RLE 4sec LLE 4.2sec 11/19/18 RLE 4sec LLE 6sec    Time  8    Period  Weeks    Status  On-going            Plan - 03/19/19 1126    Clinical Impression Statement  PT reassessed  goals today, where patient is making slow, steady progress toward goals, despite some recent setbacks with patient needing more pain  management during sessions. Patient will continue to benfit from PT to increase core and BLE strength, endurance, and pain. PT will continue progression as able.    Personal Factors and Comorbidities  Age;Behavior Pattern;Fitness;Past/Current Experience;Comorbidity 1;Comorbidity 2;Comorbidity 3+    Comorbidities  scolosis, DM1, chronic pain syndrome, HTN, afib, depression    Examination-Activity Limitations  Bathing;Dressing;Transfers;Caring for Others;Carry;Squat;Lift;Bend;Bed Mobility    Examination-Participation Restrictions  Yard Work;Laundry;Cleaning;Community Activity    Stability/Clinical Decision Making  Evolving/Moderate complexity    Clinical Decision Making  Moderate    Rehab Potential  Fair    Clinical Impairments Affecting Rehab Potential  comorbidities, symptom duration, curvature degree    PT Frequency  2x / week    PT Duration  8 weeks    PT Treatment/Interventions  ADLs/Self Care Home Management;Aquatic Therapy;Cryotherapy;Ultrasound;Parrafin;Traction;Moist Heat;Electrical Stimulation;DME Instruction;Gait training;Stair training;Functional mobility training;Neuromuscular re-education;Balance training;Therapeutic exercise;Therapeutic activities;Patient/family education;Energy conservation;Spinal Manipulations;Joint Manipulations;Passive range of motion;Dry needling;Manual techniques    PT Next Visit Plan  Progress Hip and core strengthening as tolerated    PT Home Exercise Plan  Added standing pec stretch in doorway to HEP today    Consulted and Agree with Plan of Care  Patient       Patient will benefit from skilled therapeutic intervention in order to improve the following deficits and impairments:  Abnormal gait, Decreased balance, Decreased endurance, Decreased mobility, Difficulty walking, Hypomobility, Increased muscle spasms, Decreased range of motion, Pain, Postural dysfunction, Impaired flexibility, Increased fascial restricitons, Decreased strength, Decreased activity  tolerance  Visit Diagnosis: Other idiopathic scoliosis, lumbar region  Radiculopathy, lumbar region     Problem List Patient Active Problem List   Diagnosis Date Noted  . Central sleep apnea 10/09/2016  . Hypertension 10/09/2016  . Lumbar postlaminectomy syndrome 10/09/2016  . Lumbar radiculopathy 10/09/2016  . Diabetic polyneuropathy ([place]) 10/07/2016  . Right lumbosacral radiculopathy 10/07/2016  . Healthcare maintenance 08/26/2016  . DKA (diabetic ketoacidoses) ([place]) 06/14/2016  . Atypical chest pain 02/23/2016  . SOB (shortness of breath) 02/23/2016  . Pain medication agreement signed 01/13/2015  . Acquired hypothyroidism 05/30/2014  . Frontal sinusitis 12/17/2013  . Atrial fibrillation ([place]) 11/29/2013  . Major depression in remission ([place]) 11/20/2013  . 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 Chronic pain syndrome 08/27/2013  . Back pain 07/17/2013  . HTN (hypertension), benign 07/17/2013  . Hyperlipidemia 07/17/2013  . Pancreatic insufficiency 07/17/2013  . Chronic postoperative pain 01/01/2013  . Long term current use of opiate analgesic 11/04/2012  . Hypothyroidism 08/25/2012  . Radiculopathy of leg 05/01/2011   Shelton Silvas PT, DPT Shelton Silvas 03/19/2019, 11:34 AM  [facility] [address], [place], [address] Phone: [phone]   Fax:  [phone]  Name: Colleen Payne MRN: [id number] Date of Birth: [birth year]

## 2019-03-22 ENCOUNTER — Ambulatory Visit: Payer: Medicare Other | Attending: Anesthesiology | Admitting: Physical Therapy

## 2019-03-22 ENCOUNTER — Other Ambulatory Visit: Payer: Self-pay

## 2019-03-22 ENCOUNTER — Encounter: Payer: Self-pay | Admitting: Physical Therapy

## 2019-03-22 DIAGNOSIS — M4126 Other idiopathic scoliosis, lumbar region: Secondary | ICD-10-CM | POA: Diagnosis present

## 2019-03-22 DIAGNOSIS — M5416 Radiculopathy, lumbar region: Secondary | ICD-10-CM | POA: Diagnosis present

## 2019-03-22 NOTE — Therapy (Signed)
[facility] [address] [place], [address] Phone: [phone]   Fax:  [phone]  Physical Therapy Treatment  Patient Details  Name: Colleen Payne MRN: [id number] Date of Birth: [birth year] No data recorded  Encounter Date: 03/22/2019    Past Medical History:  Diagnosis Date  . Anxiety   . Atrial fibrillation ([place])   . Depression   . Diabetes mellitus without complication (HCC)    Type I   . Headache    stress. 1x/month  . Hypertension   . Insulin pump in place   . Motion sickness    cars  . MVP (mitral valve prolapse)   . Neuromuscular disorder (HCC)    leg weakness s/p back surgeries  . Thyroid disease     Past Surgical History:  Procedure Laterality Date  . BACK SURGERY  2004   3 sugeries between Sept and Nov, fusion L2-S1  . CATARACT EXTRACTION W/PHACO Left 11/23/2014   Procedure: CATARACT EXTRACTION PHACO AND INTRAOCULAR LENS PLACEMENT (IOC);  Surgeon: Leandrew Koyanagi, MD;  Location: [facility];  Service: Ophthalmology;  Laterality: Left;  DIABETIC - insulin pump    There were no vitals filed for this visit.  Subjective Assessment - 03/22/19 1040    Subjective  Reports her LBP and L shoulder are increased today, LBP 8/10 L shoulder 6/10. Denies any increased or painful activity, just reports she thinks the weather is making her pain worse, and endorses some increased life stress toeday    Pertinent History  Patient is 69 yo female that complains of lumbar pain and inability to walk erect, as well as hip/abdominal pain. Patient with history of lumbar fusion (L2-S1). Xray results show: Fairly stable appearance of the levoscoliosis of the lower thoracic and of the lumbar spine.Stable lateral subluxation toward the left of L2 with respect L3. Stable partial compression of the body of L1. Patient complains of frustration with physicians, and that her symptoms are worsening over time.  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 Reported that her pain and difficulty worsened after trying to pick her husband up about 1 year ago. Patient mentioned that she has been told that she has "spinal fluid pockets that are leaking". PMH also includes DM I with insulin pump,  afib, HTN,  HLD, chronic pain syndrome, SOB.    Limitations  Standing;Walking;House hold activities;Other (comment)    Diagnostic tests  see exray results: Fairly stable appearance of the levoscoliosis of the lower thoracic and lumbar spine. Stable lateral subluxation toward left of L2 with respect of L3, stable partial compression of the body of L1.    Patient Stated Goals  pain relief, standing erect    Pain Onset  More than a month ago    Pain Onset  More than a month ago         Ther-Ex -[place], simultaneous bilateral 1x5 minutes -concurrent with above, BUE wand flexion over 15mins with increased abdominal stretching/pain sensation this am, taking increased time to achieve full motion - Hooklying lumbar rotaitons x20 with 5sec hold with increased with BUES abd approx 100d - Prone press up 3x 6 with rest in full prone with shoulders/elbows ext - Prone alt supermans 3x 8 each direction with cuing to ext through LEs and UEs with decent carry over - Childs pose 3x 30sec hold -forward walking1.26mph for65mins 2.0% grade,cuing forposture and "large steps" with decent carry over  PT Short Term Goals - 07/27/18 0933      PT SHORT TERM GOAL #1   Title  Patient will be adherent to initial HEP at least 3x a week to improve functional Payne and balance for better safety at home.    Time  4    Period  Weeks    Status  Achieved    Target Date  12/17/17        PT Long Term Goals - 03/19/19 1051      PT LONG TERM GOAL #1   Title  Patient will be independent in bending down towards floor and picking up small  object (<5 pounds) and then stand back up without increased pain to improve ability to pick up and clean up room at home    Baseline  09/25/18/20 able to pick up object without increase in pain, though she reports continued baseline pain, goal revised and achieved    Time  8    Period  Weeks    Status  Achieved      PT LONG TERM GOAL #2   Title  Patient will increase BLE gross Payne to 4+/5 as to improve functional Payne for independent gait, increased standing tolerance and increased ADL ability.    Baseline  08/31/18 R/L flexion: 5/5 ER 5/5 IR 4+/4+ ABD 5/5 Ext 4+/4    Time  8    Period  Weeks    Status  Achieved      PT LONG TERM GOAL #3   Title  Patient will report a worst pain of 5/10 with transfers of her husband to improve tolerance with ADLs     Baseline  02/01/19 7/10 pain with transfers in the LB, some increased L shoulder pain with transfers; 03/19/19 continued 7/10 pain with transfers    Time  8    Period  Weeks    Status  On-going      PT LONG TERM GOAL #4   Title  Pt will decrease 5TSTS to 10sec order to demonstrate clinically significant improvement in LE Payne, and reduced fall risk    Baseline  03/19/19 10.6sec 02/01/19 10.76sec 12/07/18 17sec    Time  8    Period  Weeks    Status  On-going      PT LONG TERM GOAL #5   Title  Pt will increase 10MWT by at least 0.13 m/s in order to demonstrate clinically significant improvement in community ambulation    Baseline  02/01/19 m/s 09/25/18 0.83 07/31/18 0.47m/s 07/20/18 same 06/10/18 0.60m/s    Time  8    Period  Weeks    Status  Achieved      PT LONG TERM GOAL #6   Title  Patient will demonstrate walk speed of 1.2 to demonstrate community ambulation norm    Baseline  03/19/19 1.70m/s 02/01/19 0.82m/s 12/07/18 0.28m/s 10/12/18 0.83m/s 09/25/18 0.35m/s    Time  8    Period  Weeks    Status  On-going      PT LONG TERM GOAL #7   Title  Patient will increase standing tolerance to 53min without increased pain in order to complete  household chores/cooking    Baseline  03/19/19 can stand about 32mins before needing to sit for a break 02/01/19 about 28mins before needing a break11/16/20 Can stand only stand for 15 mins d/t decreased tolerance    Time  8    Period  Weeks    Status  On-going  PT LONG TERM GOAL #8   Title  Patient will demonstrate SLS of 10sec to demonstrate decreased fall risk    Baseline  03/19/19 RLE 9.28 LLE 8.5 02/01/19 RLE 8.5sec  LLE 9sec 12/07/18 RLE 4sec LLE 4.2sec 11/19/18 RLE 4sec LLE 6sec    Time  8    Period  Weeks    Status  On-going              Patient will benefit from skilled therapeutic intervention in order to improve the following deficits and impairments:     Visit Diagnosis: No diagnosis found.     Problem List Patient Active Problem List   Diagnosis Date Noted  . Central sleep apnea 10/09/2016  . Hypertension 10/09/2016  . Lumbar postlaminectomy syndrome 10/09/2016  . Lumbar radiculopathy 10/09/2016  . Diabetic polyneuropathy ([place]) 10/07/2016  . Right lumbosacral radiculopathy 10/07/2016  . Healthcare maintenance 08/26/2016  . DKA (diabetic ketoacidoses) ([place]) 06/14/2016  . Atypical chest pain 02/23/2016  . SOB (shortness of breath) 02/23/2016  . Pain medication agreement signed 01/13/2015  . Acquired hypothyroidism 05/30/2014  . Frontal sinusitis 12/17/2013  . Atrial fibrillation ([place]) 11/29/2013  . Major depression in remission ([place]) 11/20/2013  . 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 Chronic pain syndrome 08/27/2013  . Back pain 07/17/2013  . HTN (hypertension), benign 07/17/2013  . Hyperlipidemia 07/17/2013  . Pancreatic insufficiency 07/17/2013  . Chronic postoperative pain 01/01/2013  . Long term current use of opiate analgesic 11/04/2012  . Hypothyroidism 08/25/2012  . Radiculopathy of leg 05/01/2011   Shelton Silvas PT, DPT Shelton Silvas 03/22/2019, 10:42 AM  [facility] [address], [place], [address] Phone: [phone]   Fax:  [phone]  Name: Colleen Payne MRN: [id number] Date of Birth: March 18, 1950

## 2019-03-26 ENCOUNTER — Ambulatory Visit: Payer: Medicare Other | Admitting: Physical Therapy

## 2019-03-26 ENCOUNTER — Encounter: Payer: Self-pay | Admitting: Physical Therapy

## 2019-03-26 DIAGNOSIS — M4126 Other idiopathic scoliosis, lumbar region: Secondary | ICD-10-CM | POA: Diagnosis not present

## 2019-03-26 DIAGNOSIS — M5416 Radiculopathy, lumbar region: Secondary | ICD-10-CM

## 2019-03-26 NOTE — Therapy (Signed)
[facility] [address] [place], [address] Phone: [phone]   Fax:  [phone]  Physical Therapy Treatment  Patient Details  Name: Colleen Payne MRN: [id number] Date of Birth: 11-26-1950 No data recorded  Encounter Date: 03/26/2019  PT End of Session - 03/26/19 1051    Visit Number  61    Number of Visits  28    Date for PT Re-Evaluation  06/18/19    Authorization Type  Medicare    Authorization Time Period  2/10    PT Start Time  1047    PT Stop Time  1128    PT Time Calculation (min)  41 min    Activity Tolerance  Patient tolerated treatment well    Behavior During Therapy  [facility] for tasks assessed/performed;Flat affect       Past Medical History:  Diagnosis Date  . Anxiety   . Atrial fibrillation ([place])   . Depression   . Diabetes mellitus without complication (HCC)    Type I   . Headache    stress. 1x/month  . Hypertension   . Insulin pump in place   . Motion sickness    cars  . MVP (mitral valve prolapse)   . Neuromuscular disorder (HCC)    leg weakness s/p back surgeries  . Thyroid disease     Past Surgical History:  Procedure Laterality Date  . BACK SURGERY  2004   3 sugeries between Sept and Nov, fusion L2-S1  . CATARACT EXTRACTION W/PHACO Left 11/23/2014   Procedure: CATARACT EXTRACTION PHACO AND INTRAOCULAR LENS PLACEMENT (IOC);  Surgeon: Leandrew Koyanagi, MD;  Location: [facility];  Service: Ophthalmology;  Laterality: Left;  DIABETIC - insulin pump    There were no vitals filed for this visit.  Subjective Assessment - 03/26/19 1049    Subjective  Reports her LBP is not too bad today, 7/10, is continuing to have no "catching". Reports she did well following last session, and thinks she has been "standing up a little better"    Pertinent History  Patient is 69 yo female that complains of lumbar pain and inability to walk erect, as well as hip/abdominal pain.  Patient with history of lumbar fusion (L2-S1). Xray results show: Fairly stable appearance of the levoscoliosis of the lower thoracic and of the lumbar spine.Stable lateral subluxation toward the left of L2 with respect L3. Stable partial compression of the body of L1. Patient complains of frustration with physicians, and that her symptoms are worsening over time. 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 Reported that her pain and difficulty worsened after trying to pick her husband up about 1 year ago. Patient mentioned that she has been told that she has "spinal fluid pockets that are leaking". PMH also includes DM I with insulin pump,  afib, HTN,  HLD, chronic pain syndrome, SOB.    Limitations  Standing;Walking;House hold activities;Other (comment)    Diagnostic tests  see exray results: Fairly stable appearance of the levoscoliosis of the lower thoracic and lumbar spine. Stable lateral subluxation toward left of L2 with respect of L3, stable partial compression of the body of L1.    Patient Stated Goals  pain relief, standing erect    Pain Onset  More than a month ago    Pain Onset  More than a month ago         Ther-Ex -[employer], simultaneous bilateral  1x5 minutes -concurrent with above, BUE wand flexion over 64mins with increased abdominal stretching/pain sensation this am, taking increased time to achieve full motion - Prone press up 2x 10 with rest in full prone with shoulders/elbows ext - Prone alt supermans 3x 08/29/08 each direction with better mobility this session - Quadraped donkey kicks 3x 8 with TC to prevent hip rotation with good carry over; rests between L and R set in childs pose x56min (x3 reps) - Squat with 5# DB with ant hold 3x 10 with mat table for TC, cuing to "keep chest up" against forward resistance with decent carry over - Hip hike with CL hip swing x15 each LE - Backward walking1.64mph for74mins; 0.44mph for  65min,cuing forposture and "large steps" with decent carry over                         PT Education - 03/26/19 1050    Education Details  therex form, posture, gait    Person(s) Educated  Patient    Methods  Explanation;Demonstration;Tactile cues;Verbal cues    Comprehension  Verbalized understanding;Returned demonstration;Verbal cues required;Tactile cues required       PT Short Term Goals - 07/27/18 0933      PT SHORT TERM GOAL #1   Title  Patient will be adherent to initial HEP at least 3x a week to improve functional strength and balance for better safety at home.    Time  4    Period  Weeks    Status  Achieved    Target Date  12/17/17        PT Long Term Goals - 03/19/19 1051      PT LONG TERM GOAL #1   Title  Patient will be independent in bending down towards floor and picking up small object (<5 pounds) and then stand back up without increased pain to improve ability to pick up and clean up room at home    Baseline  09/25/18/20 able to pick up object without increase in pain, though she reports continued baseline pain, goal revised and achieved    Time  8    Period  Weeks    Status  Achieved      PT LONG TERM GOAL #2   Title  Patient will increase BLE gross strength to 4+/5 as to improve functional strength for independent gait, increased standing tolerance and increased ADL ability.    Baseline  08/31/18 R/L flexion: 5/5 ER 5/5 IR 4+/4+ ABD 5/5 Ext 4+/4    Time  8    Period  Weeks    Status  Achieved      PT LONG TERM GOAL #3   Title  Patient will report a worst pain of 5/10 with transfers of her husband to improve tolerance with ADLs     Baseline  02/01/19 7/10 pain with transfers in the LB, some increased L shoulder pain with transfers; 03/19/19 continued 7/10 pain with transfers    Time  8    Period  Weeks    Status  On-going      PT LONG TERM GOAL #4   Title  Pt will decrease 5TSTS to 10sec order to demonstrate clinically significant  improvement in LE strength, and reduced fall risk    Baseline  03/19/19 10.6sec 02/01/19 10.76sec 12/07/18 17sec    Time  8    Period  Weeks    Status  On-going      PT LONG TERM  GOAL #5   Title  Pt will increase 10MWT by at least 0.13 m/s in order to demonstrate clinically significant improvement in community ambulation    Baseline  02/01/19 m/s 09/25/18 0.83 07/31/18 0.43m/s 07/20/18 same 06/10/18 0.37m/s    Time  8    Period  Weeks    Status  Achieved      PT LONG TERM GOAL #6   Title  Patient will demonstrate walk speed of 1.2 to demonstrate community ambulation norm    Baseline  03/19/19 1.75m/s 02/01/19 0.56m/s 12/07/18 0.72m/s 10/12/18 0.43m/s 09/25/18 0.8m/s    Time  8    Period  Weeks    Status  On-going      PT LONG TERM GOAL #7   Title  Patient will increase standing tolerance to 25min without increased pain in order to complete household chores/cooking    Baseline  03/19/19 can stand about 88mins before needing to sit for a break 02/01/19 about 54mins before needing a break11/16/20 Can stand only stand for 15 mins d/t decreased tolerance    Time  8    Period  Weeks    Status  On-going      PT LONG TERM GOAL #8   Title  Patient will demonstrate SLS of 10sec to demonstrate decreased fall risk    Baseline  03/19/19 RLE 9.28 LLE 8.5 02/01/19 RLE 8.5sec  LLE 9sec 12/07/18 RLE 4sec LLE 4.2sec 11/19/18 RLE 4sec LLE 6sec    Time  8    Period  Weeks    Status  On-going            Plan - 03/26/19 1057    Clinical Impression Statement  PT continued therex progression, with patient demonstrating better mobility this session. Pt is able to comply with all cuing with technique towards upright posture and is motivated throughout session. PT will continue progression as able.    Personal Factors and Comorbidities  Age;Behavior Pattern;Fitness;Past/Current Experience;Comorbidity 1;Comorbidity 2;Comorbidity 3+    Comorbidities  scolosis, DM1, chronic pain syndrome, HTN, afib, depression     Examination-Activity Limitations  Bathing;Dressing;Transfers;Caring for Others;Carry;Squat;Lift;Bend;Bed Mobility    Examination-Participation Restrictions  Yard Work;Laundry;Cleaning;Community Activity    Stability/Clinical Decision Making  Evolving/Moderate complexity    Clinical Decision Making  Moderate    Rehab Potential  Fair    Clinical Impairments Affecting Rehab Potential  comorbidities, symptom duration, curvature degree    PT Frequency  2x / week    PT Duration  8 weeks    PT Treatment/Interventions  ADLs/Self Care Home Management;Aquatic Therapy;Cryotherapy;Ultrasound;Parrafin;Traction;Moist Heat;Electrical Stimulation;DME Instruction;Gait training;Stair training;Functional mobility training;Neuromuscular re-education;Balance training;Therapeutic exercise;Therapeutic activities;Patient/family education;Energy conservation;Spinal Manipulations;Joint Manipulations;Passive range of motion;Dry needling;Manual techniques    PT Next Visit Plan  Progress Hip and core strengthening as tolerated    PT Home Exercise Plan  Added standing pec stretch in doorway to HEP today    Consulted and Agree with Plan of Care  Patient       Patient will benefit from skilled therapeutic intervention in order to improve the following deficits and impairments:  Abnormal gait, Decreased balance, Decreased endurance, Decreased mobility, Difficulty walking, Hypomobility, Increased muscle spasms, Decreased range of motion, Pain, Postural dysfunction, Impaired flexibility, Increased fascial restricitons, Decreased strength, Decreased activity tolerance  Visit Diagnosis: Other idiopathic scoliosis, lumbar region  Radiculopathy, lumbar region     Problem List Patient Active Problem List   Diagnosis Date Noted  . Central sleep apnea 10/09/2016  . Hypertension 10/09/2016  . Lumbar postlaminectomy syndrome 10/09/2016  .  Lumbar radiculopathy 10/09/2016  . Diabetic polyneuropathy ([place]) 10/07/2016  . Right  lumbosacral radiculopathy 10/07/2016  . Healthcare maintenance 08/26/2016  . DKA (diabetic ketoacidoses) ([place]) 06/14/2016  . Atypical chest pain 02/23/2016  . SOB (shortness of breath) 02/23/2016  . Pain medication agreement signed 01/13/2015  . Acquired hypothyroidism 05/30/2014  . Frontal sinusitis 12/17/2013  . Atrial fibrillation ([place]) 11/29/2013  . Major depression in remission ([place]) 11/20/2013  . 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 Chronic pain syndrome 08/27/2013  . Back pain 07/17/2013  . HTN (hypertension), benign 07/17/2013  . Hyperlipidemia 07/17/2013  . Pancreatic insufficiency 07/17/2013  . Chronic postoperative pain 01/01/2013  . Long term current use of opiate analgesic 11/04/2012  . Hypothyroidism 08/25/2012  . Radiculopathy of leg 05/01/2011   Shelton Silvas PT, DPT Shelton Silvas 03/26/2019, 11:21 AM  [facility] [address] [place], [address] Phone: [phone]   Fax:  [phone]  Name: BECKIE CANDITO MRN: [id number] Date of Birth: [birth year]

## 2019-03-29 ENCOUNTER — Encounter: Payer: Self-pay | Admitting: Physical Therapy

## 2019-03-29 ENCOUNTER — Other Ambulatory Visit: Payer: Self-pay

## 2019-03-29 ENCOUNTER — Ambulatory Visit: Payer: Medicare Other | Admitting: Physical Therapy

## 2019-03-29 DIAGNOSIS — M5416 Radiculopathy, lumbar region: Secondary | ICD-10-CM

## 2019-03-29 DIAGNOSIS — M4126 Other idiopathic scoliosis, lumbar region: Secondary | ICD-10-CM

## 2019-03-29 NOTE — Therapy (Signed)
[facility] [address], [place], [address] Phone: [phone]   Fax:  [phone]  Physical Therapy Treatment  Patient Details  Name: Colleen Payne MRN: [id number] Date of Birth: 1950-06-17 No data recorded  Encounter Date: 03/29/2019  PT End of Session - 03/29/19 1037    Visit Number  62    Number of Visits  92    Date for PT Re-Evaluation  06/18/19    Authorization Type  Medicare    Authorization Time Period  3/10    PT Start Time  1030    PT Stop Time  1110    PT Time Calculation (min)  40 min    Activity Tolerance  Patient tolerated treatment well    Behavior During Therapy  [facility] for tasks assessed/performed;Flat affect       Past Medical History:  Diagnosis Date  . Anxiety   . Atrial fibrillation ([place])   . Depression   . Diabetes mellitus without complication (HCC)    Type I   . Headache    stress. 1x/month  . Hypertension   . Insulin pump in place   . Motion sickness    cars  . MVP (mitral valve prolapse)   . Neuromuscular disorder (HCC)    leg weakness s/p back surgeries  . Thyroid disease     Past Surgical History:  Procedure Laterality Date  . BACK SURGERY  2004   3 sugeries between Sept and Nov, fusion L2-S1  . CATARACT EXTRACTION W/PHACO Left 11/23/2014   Procedure: CATARACT EXTRACTION PHACO AND INTRAOCULAR LENS PLACEMENT (IOC);  Surgeon: Leandrew Koyanagi, MD;  Location: [facility];  Service: Ophthalmology;  Laterality: Left;  DIABETIC - insulin pump    There were no vitals filed for this visit.  Subjective Assessment - 03/29/19 1034    Subjective  Patinet reports she was "moving a little better" after Friday, with some increased stiffness Saturday. Reports pain is 8/10. Has been continuing to work on standing up tall.    Pertinent History  Patient is 69 yo female that complains of lumbar pain and inability to walk erect, as well as hip/abdominal pain. Patient  with history of lumbar fusion (L2-S1). Xray results show: Fairly stable appearance of the levoscoliosis of the lower thoracic and of the lumbar spine.Stable lateral subluxation toward the left of L2 with respect L3. Stable partial compression of the body of L1. Patient complains of frustration with physicians, and that her symptoms are worsening over time. 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 Reported that her pain and difficulty worsened after trying to pick her husband up about 1 year ago. Patient mentioned that she has been told that she has "spinal fluid pockets that are leaking". PMH also includes DM I with insulin pump,  afib, HTN,  HLD, chronic pain syndrome, SOB.    Limitations  Standing;Walking;House hold activities;Other (comment)    Diagnostic tests  see exray results: Fairly stable appearance of the levoscoliosis of the lower thoracic and lumbar spine. Stable lateral subluxation toward left of L2 with respect of L3, stable partial compression of the body of L1.    Patient Stated Goals  pain relief, standing erect    Pain Onset  More than a month ago         Ther-Ex -[employer], simultaneous bilateral 1x5 minutes -concurrent with above, BUE wand flexionover 15mins with increased abdominal stretching/pain sensation this  am, taking increased time to achieve full motion - Prone press up x15 with min cuing for full press up with good carry over - Prone alt supermans 3x 12 with paitent adapting well to increased rep range -Squat with 4# DB with ant hold 3x 01/02/14 with mat table for TC, cuing to "keep chest up" against forward resistance with decent carry over - TRX pull ups 3x 12 with cuing for full elbow ext with good carry over - Hip hike with CL hip swing x15 each LE - Backwardwalking1.13mph for51mins; 0.66mph for 78min,cuing forposture and "large steps" with decent carry over                       PT  Education - 03/29/19 1037    Education Details  therex form, posture, gait    Person(s) Educated  Patient    Methods  Explanation;Demonstration;Verbal cues    Comprehension  Verbalized understanding;Returned demonstration;Verbal cues required       PT Short Term Goals - 07/27/18 0933      PT SHORT TERM GOAL #1   Title  Patient will be adherent to initial HEP at least 3x a week to improve functional strength and balance for better safety at home.    Time  4    Period  Weeks    Status  Achieved    Target Date  12/17/17        PT Long Term Goals - 03/19/19 1051      PT LONG TERM GOAL #1   Title  Patient will be independent in bending down towards floor and picking up small object (<5 pounds) and then stand back up without increased pain to improve ability to pick up and clean up room at home    Baseline  09/25/18/20 able to pick up object without increase in pain, though she reports continued baseline pain, goal revised and achieved    Time  8    Period  Weeks    Status  Achieved      PT LONG TERM GOAL #2   Title  Patient will increase BLE gross strength to 4+/5 as to improve functional strength for independent gait, increased standing tolerance and increased ADL ability.    Baseline  08/31/18 R/L flexion: 5/5 ER 5/5 IR 4+/4+ ABD 5/5 Ext 4+/4    Time  8    Period  Weeks    Status  Achieved      PT LONG TERM GOAL #3   Title  Patient will report a worst pain of 5/10 with transfers of her husband to improve tolerance with ADLs     Baseline  02/01/19 7/10 pain with transfers in the LB, some increased L shoulder pain with transfers; 03/19/19 continued 7/10 pain with transfers    Time  8    Period  Weeks    Status  On-going      PT LONG TERM GOAL #4   Title  Pt will decrease 5TSTS to 10sec order to demonstrate clinically significant improvement in LE strength, and reduced fall risk    Baseline  03/19/19 10.6sec 02/01/19 10.76sec 12/07/18 17sec    Time  8    Period  Weeks    Status   On-going      PT LONG TERM GOAL #5   Title  Pt will increase 10MWT by at least 0.13 m/s in order to demonstrate clinically significant improvement in community ambulation    Baseline  02/01/19 m/s  09/25/18 0.83 07/31/18 0.11m/s 07/20/18 same 06/10/18 0.28m/s    Time  8    Period  Weeks    Status  Achieved      PT LONG TERM GOAL #6   Title  Patient will demonstrate walk speed of 1.2 to demonstrate community ambulation norm    Baseline  03/19/19 1.61m/s 02/01/19 0.65m/s 12/07/18 0.36m/s 10/12/18 0.82m/s 09/25/18 0.55m/s    Time  8    Period  Weeks    Status  On-going      PT LONG TERM GOAL #7   Title  Patient will increase standing tolerance to 27min without increased pain in order to complete household chores/cooking    Baseline  03/19/19 can stand about 52mins before needing to sit for a break 02/01/19 about 12mins before needing a break11/16/20 Can stand only stand for 15 mins d/t decreased tolerance    Time  8    Period  Weeks    Status  On-going      PT LONG TERM GOAL #8   Title  Patient will demonstrate SLS of 10sec to demonstrate decreased fall risk    Baseline  03/19/19 RLE 9.28 LLE 8.5 02/01/19 RLE 8.5sec  LLE 9sec 12/07/18 RLE 4sec LLE 4.2sec 11/19/18 RLE 4sec LLE 6sec    Time  8    Period  Weeks    Status  On-going            Plan - 03/29/19 1102    Clinical Impression Statement  PT continued therex progression with endurance rep range and increased standing demand with good tolerance. Patient is more fatigued following session, PT educated patient on overload principle and habituation with good understanding. Pt motivated throughout session. PT will continue progression as able.    Personal Factors and Comorbidities  Age;Behavior Pattern;Fitness;Past/Current Experience;Comorbidity 1;Comorbidity 2;Comorbidity 3+    Comorbidities  scolosis, DM1, chronic pain syndrome, HTN, afib, depression    Examination-Activity Limitations  Bathing;Dressing;Transfers;Caring for  Others;Carry;Squat;Lift;Bend;Bed Mobility    Stability/Clinical Decision Making  Evolving/Moderate complexity    Rehab Potential  Fair    Clinical Impairments Affecting Rehab Potential  comorbidities, symptom duration, curvature degree    PT Frequency  2x / week    PT Duration  8 weeks    PT Treatment/Interventions  ADLs/Self Care Home Management;Aquatic Therapy;Cryotherapy;Ultrasound;Parrafin;Traction;Moist Heat;Electrical Stimulation;DME Instruction;Gait training;Stair training;Functional mobility training;Neuromuscular re-education;Balance training;Therapeutic exercise;Therapeutic activities;Patient/family education;Energy conservation;Spinal Manipulations;Joint Manipulations;Passive range of motion;Dry needling;Manual techniques    PT Next Visit Plan  Progress Hip and core strengthening as tolerated    PT Home Exercise Plan  Added standing pec stretch in doorway to HEP today    Consulted and Agree with Plan of Care  Patient       Patient will benefit from skilled therapeutic intervention in order to improve the following deficits and impairments:  Abnormal gait, Decreased balance, Decreased endurance, Decreased mobility, Difficulty walking, Hypomobility, Increased muscle spasms, Decreased range of motion, Pain, Postural dysfunction, Impaired flexibility, Increased fascial restricitons, Decreased strength, Decreased activity tolerance  Visit Diagnosis: Other idiopathic scoliosis, lumbar region  Radiculopathy, lumbar region     Problem List Patient Active Problem List   Diagnosis Date Noted  . Central sleep apnea 10/09/2016  . Hypertension 10/09/2016  . Lumbar postlaminectomy syndrome 10/09/2016  . Lumbar radiculopathy 10/09/2016  . Diabetic polyneuropathy ([place]) 10/07/2016  . Right lumbosacral radiculopathy 10/07/2016  . Healthcare maintenance 08/26/2016  . DKA (diabetic ketoacidoses) ([place]) 06/14/2016  . Atypical chest pain 02/23/2016  . SOB (shortness of breath) 02/23/2016  .  Pain medication agreement signed 01/13/2015  . Acquired hypothyroidism 05/30/2014  . Frontal sinusitis 12/17/2013  . Atrial fibrillation ([place]) 11/29/2013  . Major depression in remission ([place]) 11/20/2013  . 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 Chronic pain syndrome 08/27/2013  . Back pain 07/17/2013  . HTN (hypertension), benign 07/17/2013  . Hyperlipidemia 07/17/2013  . Pancreatic insufficiency 07/17/2013  . Chronic postoperative pain 01/01/2013  . Long term current use of opiate analgesic 11/04/2012  . Hypothyroidism 08/25/2012  . Radiculopathy of leg 05/01/2011   Shelton Silvas PT, DPT Shelton Silvas 03/29/2019, 11:13 AM  [facility] [address], [place], [address] Phone: [phone]   Fax:  [phone]  Name: Colleen Payne MRN: [id number] Date of Birth: Oct 09, 1950

## 2019-04-02 ENCOUNTER — Other Ambulatory Visit: Payer: Self-pay

## 2019-04-02 ENCOUNTER — Encounter: Payer: Self-pay | Admitting: Physical Therapy

## 2019-04-02 ENCOUNTER — Ambulatory Visit: Payer: Medicare Other | Admitting: Physical Therapy

## 2019-04-02 DIAGNOSIS — M4126 Other idiopathic scoliosis, lumbar region: Secondary | ICD-10-CM | POA: Diagnosis not present

## 2019-04-02 DIAGNOSIS — M5416 Radiculopathy, lumbar region: Secondary | ICD-10-CM

## 2019-04-02 NOTE — Therapy (Signed)
[facility] [address] [place], [address] Phone: [phone]   Fax:  [phone]  Physical Therapy Treatment  Patient Details  Name: Colleen Payne MRN: [id number] Date of Birth: [birth year] No data recorded  Encounter Date: 04/02/2019  PT End of Session - 04/02/19 1054    Visit Number  63    Number of Visits  92    Date for PT Re-Evaluation  06/18/19    Authorization Type  Medicare    Authorization Time Period  4/10    PT Start Time  1045    PT Stop Time  1125    PT Time Calculation (min)  40 min    Activity Tolerance  Patient tolerated treatment well    Behavior During Therapy  [facility] for tasks assessed/performed;Flat affect       Past Medical History:  Diagnosis Date  . Anxiety   . Atrial fibrillation ([place])   . Depression   . Diabetes mellitus without complication (HCC)    Type I   . Headache    stress. 1x/month  . Hypertension   . Insulin pump in place   . Motion sickness    cars  . MVP (mitral valve prolapse)   . Neuromuscular disorder (HCC)    leg weakness s/p back surgeries  . Thyroid disease     Past Surgical History:  Procedure Laterality Date  . BACK SURGERY  2004   3 sugeries between Sept and Nov, fusion L2-S1  . CATARACT EXTRACTION W/PHACO Left 11/23/2014   Procedure: CATARACT EXTRACTION PHACO AND INTRAOCULAR LENS PLACEMENT (IOC);  Surgeon: Leandrew Koyanagi, MD;  Location: [facility];  Service: Ophthalmology;  Laterality: Left;  DIABETIC - insulin pump    There were no vitals filed for this visit.  Subjective Assessment - 04/02/19 1051    Subjective  Reports pain has been a little better since Monday, 7/10.    Pertinent History  Patient is 69 yo female that complains of lumbar pain and inability to walk erect, as well as hip/abdominal pain. Patient with history of lumbar fusion (L2-S1). Xray results show: Fairly stable appearance of the levoscoliosis of the lower  thoracic and of the lumbar spine.Stable lateral subluxation toward the left of L2 with respect L3. Stable partial compression of the body of L1. Patient complains of frustration with physicians, and that her symptoms are worsening over time. 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 Reported that her pain and difficulty worsened after trying to pick her husband up about 1 year ago. Patient mentioned that she has been told that she has "spinal fluid pockets that are leaking". PMH also includes DM I with insulin pump,  afib, HTN,  HLD, chronic pain syndrome, SOB.    Limitations  Standing;Walking;House hold activities;Other (comment)    Diagnostic tests  see exray results: Fairly stable appearance of the levoscoliosis of the lower thoracic and lumbar spine. Stable lateral subluxation toward left of L2 with respect of L3, stable partial compression of the body of L1.    Patient Stated Goals  pain relief, standing erect    Pain Onset  More than a month ago    Pain Onset  More than a month ago       Ther-Ex -[employer], simultaneous bilateral 1x5 minutes -concurrent with above, BUE wand flexionover 57mins with increased abdominal stretching/pain sensation this am, taking increased time to achieve full motion -  Prone press upx15with min cuing for full press up with good carry over - [employer] hold 3x 30sec with stabilization at LEs, patient able to hold as close to spine neutral as possible -Squat with 4# DB with ant hold 3x 15 with mat table for TC; good carry over for technique with visual target - TRX pull ups 3x 15 with cuing for full elbow ext with good carry over - Hip hike with CL hip swing x15 each LE -Backwardwalking1.34mph for 92mins,cuing forposture and "large steps" with decent carry over                         PT Education - 04/02/19 1051    Education Details  therex form, posture, gait training     Person(s) Educated  Patient    Methods  Explanation;Demonstration;Verbal cues;Tactile cues    Comprehension  Verbalized understanding;Returned demonstration;Verbal cues required;Tactile cues required       PT Short Term Goals - 07/27/18 0933      PT SHORT TERM GOAL #1   Title  Patient will be adherent to initial HEP at least 3x a week to improve functional strength and balance for better safety at home.    Time  4    Period  Weeks    Status  Achieved    Target Date  12/17/17        PT Long Term Goals - 03/19/19 1051      PT LONG TERM GOAL #1   Title  Patient will be independent in bending down towards floor and picking up small object (<5 pounds) and then stand back up without increased pain to improve ability to pick up and clean up room at home    Baseline  09/25/18/20 able to pick up object without increase in pain, though she reports continued baseline pain, goal revised and achieved    Time  8    Period  Weeks    Status  Achieved      PT LONG TERM GOAL #2   Title  Patient will increase BLE gross strength to 4+/5 as to improve functional strength for independent gait, increased standing tolerance and increased ADL ability.    Baseline  08/31/18 R/L flexion: 5/5 ER 5/5 IR 4+/4+ ABD 5/5 Ext 4+/4    Time  8    Period  Weeks    Status  Achieved      PT LONG TERM GOAL #3   Title  Patient will report a worst pain of 5/10 with transfers of her husband to improve tolerance with ADLs     Baseline  02/01/19 7/10 pain with transfers in the LB, some increased L shoulder pain with transfers; 03/19/19 continued 7/10 pain with transfers    Time  8    Period  Weeks    Status  On-going      PT LONG TERM GOAL #4   Title  Pt will decrease 5TSTS to 10sec order to demonstrate clinically significant improvement in LE strength, and reduced fall risk    Baseline  03/19/19 10.6sec 02/01/19 10.76sec 12/07/18 17sec    Time  8    Period  Weeks    Status  On-going      PT LONG TERM GOAL #5   Title   Pt will increase 10MWT by at least 0.13 m/s in order to demonstrate clinically significant improvement in community ambulation    Baseline  02/01/19 m/s 09/25/18 0.83 07/31/18 0.62m/s 07/20/18  same 06/10/18 0.63m/s    Time  8    Period  Weeks    Status  Achieved      PT LONG TERM GOAL #6   Title  Patient will demonstrate walk speed of 1.2 to demonstrate community ambulation norm    Baseline  03/19/19 1.75m/s 02/01/19 0.54m/s 12/07/18 0.66m/s 10/12/18 0.17m/s 09/25/18 0.51m/s    Time  8    Period  Weeks    Status  On-going      PT LONG TERM GOAL #7   Title  Patient will increase standing tolerance to 39min without increased pain in order to complete household chores/cooking    Baseline  03/19/19 can stand about 81mins before needing to sit for a break 02/01/19 about 90mins before needing a break11/16/20 Can stand only stand for 15 mins d/t decreased tolerance    Time  8    Period  Weeks    Status  On-going      PT LONG TERM GOAL #8   Title  Patient will demonstrate SLS of 10sec to demonstrate decreased fall risk    Baseline  03/19/19 RLE 9.28 LLE 8.5 02/01/19 RLE 8.5sec  LLE 9sec 12/07/18 RLE 4sec LLE 4.2sec 11/19/18 RLE 4sec LLE 6sec    Time  8    Period  Weeks    Status  On-going            Plan - 04/02/19 1104    Clinical Impression Statement  PT continued therex progression for postural musclulature endurance with good success. Patient is continuing to increase standing therex tolerance, and compliance of technique between session. Patient does report some decreased pain following a few weeks of consistent PT attendance, which is an improvement. PT will continue progression as able.    Personal Factors and Comorbidities  Age;Behavior Pattern;Fitness;Past/Current Experience;Comorbidity 1;Comorbidity 2;Comorbidity 3+    Comorbidities  scolosis, DM1, chronic pain syndrome, HTN, afib, depression    Examination-Activity Limitations  Bathing;Dressing;Transfers;Caring for  Others;Carry;Squat;Lift;Bend;Bed Mobility    Examination-Participation Restrictions  Yard Work;Laundry;Cleaning;Community Activity    Stability/Clinical Decision Making  Evolving/Moderate complexity    Clinical Decision Making  Moderate    Rehab Potential  Fair    Clinical Impairments Affecting Rehab Potential  comorbidities, symptom duration, curvature degree    PT Frequency  2x / week    PT Duration  8 weeks    PT Treatment/Interventions  ADLs/Self Care Home Management;Aquatic Therapy;Cryotherapy;Ultrasound;Parrafin;Traction;Moist Heat;Electrical Stimulation;DME Instruction;Gait training;Stair training;Functional mobility training;Neuromuscular re-education;Balance training;Therapeutic exercise;Therapeutic activities;Patient/family education;Energy conservation;Spinal Manipulations;Joint Manipulations;Passive range of motion;Dry needling;Manual techniques    PT Next Visit Plan  Progress Hip and core strengthening as tolerated    PT Home Exercise Plan  Added standing pec stretch in doorway to HEP today    Consulted and Agree with Plan of Care  Patient       Patient will benefit from skilled therapeutic intervention in order to improve the following deficits and impairments:  Abnormal gait, Decreased balance, Decreased endurance, Decreased mobility, Difficulty walking, Hypomobility, Increased muscle spasms, Decreased range of motion, Pain, Postural dysfunction, Impaired flexibility, Increased fascial restricitons, Decreased strength, Decreased activity tolerance  Visit Diagnosis: Other idiopathic scoliosis, lumbar region  Radiculopathy, lumbar region     Problem List Patient Active Problem List   Diagnosis Date Noted  . Central sleep apnea 10/09/2016  . Hypertension 10/09/2016  . Lumbar postlaminectomy syndrome 10/09/2016  . Lumbar radiculopathy 10/09/2016  . Diabetic polyneuropathy ([place]) 10/07/2016  . Right lumbosacral radiculopathy 10/07/2016  . Healthcare maintenance 08/26/2016   .  DKA (diabetic ketoacidoses) ([place]) 06/14/2016  . Atypical chest pain 02/23/2016  . SOB (shortness of breath) 02/23/2016  . Pain medication agreement signed 01/13/2015  . Acquired hypothyroidism 05/30/2014  . Frontal sinusitis 12/17/2013  . Atrial fibrillation ([place]) 11/29/2013  . Major depression in remission ([place]) 11/20/2013  . 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 Chronic pain syndrome 08/27/2013  . Back pain 07/17/2013  . HTN (hypertension), benign 07/17/2013  . Hyperlipidemia 07/17/2013  . Pancreatic insufficiency 07/17/2013  . Chronic postoperative pain 01/01/2013  . Long term current use of opiate analgesic 11/04/2012  . Hypothyroidism 08/25/2012  . Radiculopathy of leg 05/01/2011   Shelton Silvas PT, DPT Shelton Silvas 04/02/2019, 11:25 AM  [facility] [address], [place], [address] Phone: [phone]   Fax:  [phone]  Name: Colleen Payne MRN: [id number] Date of Birth: Sep 27, 1950

## 2019-04-05 ENCOUNTER — Encounter: Payer: Self-pay | Admitting: Physical Therapy

## 2019-04-05 ENCOUNTER — Other Ambulatory Visit: Payer: Self-pay

## 2019-04-05 ENCOUNTER — Ambulatory Visit: Payer: Medicare Other | Admitting: Physical Therapy

## 2019-04-05 DIAGNOSIS — M5416 Radiculopathy, lumbar region: Secondary | ICD-10-CM

## 2019-04-05 DIAGNOSIS — M4126 Other idiopathic scoliosis, lumbar region: Secondary | ICD-10-CM

## 2019-04-05 NOTE — Therapy (Signed)
[facility] [address] [place], [address] Phone: [phone]   Fax:  [phone]  Physical Therapy Treatment  Patient Details  Name: Colleen Payne MRN: [id number] Date of Birth: Dec 23, 1950 No data recorded  Encounter Date: 04/05/2019  PT End of Session - 04/05/19 1039    Visit Number  64    Number of Visits  92    Date for PT Re-Evaluation  06/18/19    Authorization Type  Medicare    Authorization Time Period  5/10    PT Start Time  1035    PT Stop Time  1113    PT Time Calculation (min)  38 min    Activity Tolerance  Patient tolerated treatment well    Behavior During Therapy  [facility] for tasks assessed/performed       Past Medical History:  Diagnosis Date  . Anxiety   . Atrial fibrillation ([place])   . Depression   . Diabetes mellitus without complication (HCC)    Type I   . Headache    stress. 1x/month  . Hypertension   . Insulin pump in place   . Motion sickness    cars  . MVP (mitral valve prolapse)   . Neuromuscular disorder (HCC)    leg weakness s/p back surgeries  . Thyroid disease     Past Surgical History:  Procedure Laterality Date  . BACK SURGERY  2004   3 sugeries between Sept and Nov, fusion L2-S1  . CATARACT EXTRACTION W/PHACO Left 11/23/2014   Procedure: CATARACT EXTRACTION PHACO AND INTRAOCULAR LENS PLACEMENT (IOC);  Surgeon: Leandrew Koyanagi, MD;  Location: [facility];  Service: Ophthalmology;  Laterality: Left;  DIABETIC - insulin pump    There were no vitals filed for this visit.  Subjective Assessment - 04/05/19 1037    Subjective  Reports 7/10 pain today, that she has been continuing to feel better overall, with increased pain with exacerbation, and transferring husband    Pertinent History  Patient is 69 yo female that complains of lumbar pain and inability to walk erect, as well as hip/abdominal pain. Patient with history of lumbar fusion (L2-S1). Xray  results show: Fairly stable appearance of the levoscoliosis of the lower thoracic and of the lumbar spine.Stable lateral subluxation toward the left of L2 with respect L3. Stable partial compression of the body of L1. Patient complains of frustration with physicians, and that her symptoms are worsening over time. 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 Reported that her pain and difficulty worsened after trying to pick her husband up about 1 year ago. Patient mentioned that she has been told that she has "spinal fluid pockets that are leaking". PMH also includes DM I with insulin pump,  afib, HTN,  HLD, chronic pain syndrome, SOB.    Limitations  Standing;Walking;House hold activities;Other (comment)    Diagnostic tests  see exray results: Fairly stable appearance of the levoscoliosis of the lower thoracic and lumbar spine. Stable lateral subluxation toward left of L2 with respect of L3, stable partial compression of the body of L1.    Patient Stated Goals  pain relief, standing erect    Pain Onset  More than a month ago           Ther-Ex -[employer], simultaneous bilateral 1x5 minutes -concurrent with above, BUE wand flexionover 64mins with increased abdominal stretching/pain sensation this am, taking increased time to achieve  full motion - Prone press upx15withmin cuing for full press up with good carry over - [employer] hold 3x 30sec with stabilization at LEs, patient able to hold as close to spine neutral as possible - Attempted standing rotations with bilat UE flexion with/without wt, pt unable to complete trunk rotation (completing UE abd/adduction only)  - Standing trunk rotations with bilat UE short lever with elbows at rib cage 2x 12 (each direction) with cuing to "tun with belly button" with some carry over; on foam x12  With CGA for safety  - TRX pull ups 3x 15 with cuing for hip ext with good carry over - Hip hike with CL  hip swing x15 each LE -Backwardwalking1.10mph for 72mins,cuing forposture and "large steps" with decent carry over; patient needed rest d/t fatigue at 49mins                      PT Education - 04/05/19 1039    Education Details  therex form, posture, gait training    Person(s) Educated  Patient    Methods  Explanation;Demonstration;Tactile cues;Verbal cues    Comprehension  Verbalized understanding;Returned demonstration;Verbal cues required;Tactile cues required       PT Short Term Goals - 07/27/18 0933      PT SHORT TERM GOAL #1   Title  Patient will be adherent to initial HEP at least 3x a week to improve functional strength and balance for better safety at home.    Time  4    Period  Weeks    Status  Achieved    Target Date  12/17/17        PT Long Term Goals - 03/19/19 1051      PT LONG TERM GOAL #1   Title  Patient will be independent in bending down towards floor and picking up small object (<5 pounds) and then stand back up without increased pain to improve ability to pick up and clean up room at home    Baseline  09/25/18/20 able to pick up object without increase in pain, though she reports continued baseline pain, goal revised and achieved    Time  8    Period  Weeks    Status  Achieved      PT LONG TERM GOAL #2   Title  Patient will increase BLE gross strength to 4+/5 as to improve functional strength for independent gait, increased standing tolerance and increased ADL ability.    Baseline  08/31/18 R/L flexion: 5/5 ER 5/5 IR 4+/4+ ABD 5/5 Ext 4+/4    Time  8    Period  Weeks    Status  Achieved      PT LONG TERM GOAL #3   Title  Patient will report a worst pain of 5/10 with transfers of her husband to improve tolerance with ADLs     Baseline  02/01/19 7/10 pain with transfers in the LB, some increased L shoulder pain with transfers; 03/19/19 continued 7/10 pain with transfers    Time  8    Period  Weeks    Status  On-going      PT LONG TERM  GOAL #4   Title  Pt will decrease 5TSTS to 10sec order to demonstrate clinically significant improvement in LE strength, and reduced fall risk    Baseline  03/19/19 10.6sec 02/01/19 10.76sec 12/07/18 17sec    Time  8    Period  Weeks    Status  On-going  PT LONG TERM GOAL #5   Title  Pt will increase 10MWT by at least 0.13 m/s in order to demonstrate clinically significant improvement in community ambulation    Baseline  02/01/19 m/s 09/25/18 0.83 07/31/18 0.10m/s 07/20/18 same 06/10/18 0.7m/s    Time  8    Period  Weeks    Status  Achieved      PT LONG TERM GOAL #6   Title  Patient will demonstrate walk speed of 1.2 to demonstrate community ambulation norm    Baseline  03/19/19 1.69m/s 02/01/19 0.62m/s 12/07/18 0.15m/s 10/12/18 0.29m/s 09/25/18 0.35m/s    Time  8    Period  Weeks    Status  On-going      PT LONG TERM GOAL #7   Title  Patient will increase standing tolerance to 27min without increased pain in order to complete household chores/cooking    Baseline  03/19/19 can stand about 36mins before needing to sit for a break 02/01/19 about 17mins before needing a break11/16/20 Can stand only stand for 15 mins d/t decreased tolerance    Time  8    Period  Weeks    Status  On-going      PT LONG TERM GOAL #8   Title  Patient will demonstrate SLS of 10sec to demonstrate decreased fall risk    Baseline  03/19/19 RLE 9.28 LLE 8.5 02/01/19 RLE 8.5sec  LLE 9sec 12/07/18 RLE 4sec LLE 4.2sec 11/19/18 RLE 4sec LLE 6sec    Time  8    Period  Weeks    Status  On-going            Plan - 04/05/19 1053    Clinical Impression Statement  PT continued therex progression for extensor posture with good success. Patient does not report increased pain throughout session. Pt is able to comply with all cuing for proper technique with good success. PT will continue progression as able.    Personal Factors and Comorbidities  Age;Behavior Pattern;Fitness;Past/Current Experience;Comorbidity 1;Comorbidity  2;Comorbidity 3+    Comorbidities  scolosis, DM1, chronic pain syndrome, HTN, afib, depression    Examination-Activity Limitations  Bathing;Dressing;Transfers;Caring for Others;Carry;Squat;Lift;Bend;Bed Mobility    Examination-Participation Restrictions  Yard Work;Laundry;Cleaning;Community Activity    Stability/Clinical Decision Making  Evolving/Moderate complexity    Clinical Decision Making  Moderate    Rehab Potential  Fair    Clinical Impairments Affecting Rehab Potential  comorbidities, symptom duration, curvature degree    PT Frequency  2x / week    PT Duration  8 weeks    PT Treatment/Interventions  ADLs/Self Care Home Management;Aquatic Therapy;Cryotherapy;Ultrasound;Parrafin;Traction;Moist Heat;Electrical Stimulation;DME Instruction;Gait training;Stair training;Functional mobility training;Neuromuscular re-education;Balance training;Therapeutic exercise;Therapeutic activities;Patient/family education;Energy conservation;Spinal Manipulations;Joint Manipulations;Passive range of motion;Dry needling;Manual techniques    PT Next Visit Plan  Progress Hip and core strengthening as tolerated    PT Home Exercise Plan  Added standing pec stretch in doorway to HEP today    Consulted and Agree with Plan of Care  Patient       Patient will benefit from skilled therapeutic intervention in order to improve the following deficits and impairments:  Abnormal gait, Decreased balance, Decreased endurance, Decreased mobility, Difficulty walking, Hypomobility, Increased muscle spasms, Decreased range of motion, Pain, Postural dysfunction, Impaired flexibility, Increased fascial restricitons, Decreased strength, Decreased activity tolerance  Visit Diagnosis: Other idiopathic scoliosis, lumbar region  Radiculopathy, lumbar region     Problem List Patient Active Problem List   Diagnosis Date Noted  . Central sleep apnea 10/09/2016  . Hypertension 10/09/2016  .  Lumbar postlaminectomy syndrome  10/09/2016  . Lumbar radiculopathy 10/09/2016  . Diabetic polyneuropathy ([place]) 10/07/2016  . Right lumbosacral radiculopathy 10/07/2016  . Healthcare maintenance 08/26/2016  . DKA (diabetic ketoacidoses) ([place]) 06/14/2016  . Atypical chest pain 02/23/2016  . SOB (shortness of breath) 02/23/2016  . Pain medication agreement signed 01/13/2015  . Acquired hypothyroidism 05/30/2014  . Frontal sinusitis 12/17/2013  . Atrial fibrillation ([place]) 11/29/2013  . Major depression in remission ([place]) 11/20/2013  . 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 Chronic pain syndrome 08/27/2013  . Back pain 07/17/2013  . HTN (hypertension), benign 07/17/2013  . Hyperlipidemia 07/17/2013  . Pancreatic insufficiency 07/17/2013  . Chronic postoperative pain 01/01/2013  . Long term current use of opiate analgesic 11/04/2012  . Hypothyroidism 08/25/2012  . Radiculopathy of leg 05/01/2011   Shelton Silvas PT, DPT Shelton Silvas 04/05/2019, 11:13 AM  [facility] [address], [place], [address] Phone: [phone]   Fax:  [phone]  Name: CANDIS SPRICK MRN: [id number] Date of Birth: 05/10/1950

## 2019-04-09 ENCOUNTER — Other Ambulatory Visit: Payer: Self-pay

## 2019-04-09 ENCOUNTER — Ambulatory Visit: Payer: Medicare Other | Admitting: Physical Therapy

## 2019-04-09 ENCOUNTER — Encounter: Payer: Self-pay | Admitting: Physical Therapy

## 2019-04-09 DIAGNOSIS — M4126 Other idiopathic scoliosis, lumbar region: Secondary | ICD-10-CM

## 2019-04-09 DIAGNOSIS — M5416 Radiculopathy, lumbar region: Secondary | ICD-10-CM

## 2019-04-09 NOTE — Therapy (Signed)
[facility] [address] [place], [address] Phone: [phone]   Fax:  [phone]  Physical Therapy Treatment  Patient Details  Name: Colleen Payne MRN: [id number] Date of Birth: [birth year] No data recorded  Encounter Date: 04/09/2019  PT End of Session - 04/09/19 1051    Visit Number  65    Number of Visits  92    Date for PT Re-Evaluation  06/18/19    Authorization Type  Medicare    Authorization Time Period  6/10    PT Start Time  1045    PT Stop Time  1125    PT Time Calculation (min)  40 min    Activity Tolerance  Patient tolerated treatment well    Behavior During Therapy  [facility] for tasks assessed/performed       Past Medical History:  Diagnosis Date  . Anxiety   . Atrial fibrillation ([place])   . Depression   . Diabetes mellitus without complication (HCC)    Type I   . Headache    stress. 1x/month  . Hypertension   . Insulin pump in place   . Motion sickness    cars  . MVP (mitral valve prolapse)   . Neuromuscular disorder (HCC)    leg weakness s/p back surgeries  . Thyroid disease     Past Surgical History:  Procedure Laterality Date  . BACK SURGERY  2004   3 sugeries between Sept and Nov, fusion L2-S1  . CATARACT EXTRACTION W/PHACO Left 11/23/2014   Procedure: CATARACT EXTRACTION PHACO AND INTRAOCULAR LENS PLACEMENT (IOC);  Surgeon: Leandrew Koyanagi, MD;  Location: [place];  Service: Ophthalmology;  Laterality: Left;  DIABETIC - insulin pump    There were no vitals filed for this visit.  Subjective Assessment - 04/09/19 1049    Subjective  Patient reports she is continuing to feel a little better, reports pain is 7/10.    Pertinent History  Patient is 68 yo female that complains of lumbar pain and inability to walk erect, as well as hip/abdominal pain. Patient with history of lumbar fusion (L2-S1). Xray results show: Fairly stable appearance of the levoscoliosis of  the lower thoracic and of the lumbar spine.Stable lateral subluxation toward the left of L2 with respect L3. Stable partial compression of the body of L1. Patient complains of frustration with physicians, and that her symptoms are worsening over time. 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 Reported that her pain and difficulty worsened after trying to pick her husband up about 1 year ago. Patient mentioned that she has been told that she has "spinal fluid pockets that are leaking". PMH also includes DM I with insulin pump,  afib, HTN,  HLD, chronic pain syndrome, SOB.    Limitations  Standing;Walking;House hold activities;Other (comment)    Diagnostic tests  see exray results: Fairly stable appearance of the levoscoliosis of the lower thoracic and lumbar spine. Stable lateral subluxation toward left of L2 with respect of L3, stable partial compression of the body of L1.    Patient Stated Goals  pain relief, standing erect    Pain Onset  More than a month ago    Pain Onset  More than a month ago         Ther-Ex -[employer], simultaneous bilateral 1x5 minutes -concurrent with above, BUE wand flexionover 13mins with increased abdominal stretching/pain sensation this am, taking increased time  to achieve full motion - Prone press upx15withmin cuing for full press up with good carry over - Prone superman 2x 12 bilat with cuing for ext with good carry over - Mini squat with 5# wt in front 3x12/15/15 with min cuing for scapular retraction and full stand with good carry over  - Standing trunk rotations on foam with bilat UE short lever with elbows at rib cage 3x 12 (each direction) with cuing to "turn with belly button" With CGA for safety; standing on foam rest between last two sets - Hip hike with CL hip swing x15 each LE -Backwardwalking1.33mph for53mins,cuing forposture and "large steps" with decent carry  over                       PT Education - 04/09/19 1051    Education Details  therex form, posture    Person(s) Educated  Patient    Methods  Explanation;Demonstration;Verbal cues    Comprehension  Verbalized understanding;Returned demonstration;Verbal cues required       PT Short Term Goals - 07/27/18 0933      PT SHORT TERM GOAL #1   Title  Patient will be adherent to initial HEP at least 3x a week to improve functional strength and balance for better safety at home.    Time  4    Period  Weeks    Status  Achieved    Target Date  12/17/17        PT Long Term Goals - 03/19/19 1051      PT LONG TERM GOAL #1   Title  Patient will be independent in bending down towards floor and picking up small object (<5 pounds) and then stand back up without increased pain to improve ability to pick up and clean up room at home    Baseline  09/25/18/20 able to pick up object without increase in pain, though she reports continued baseline pain, goal revised and achieved    Time  8    Period  Weeks    Status  Achieved      PT LONG TERM GOAL #2   Title  Patient will increase BLE gross strength to 4+/5 as to improve functional strength for independent gait, increased standing tolerance and increased ADL ability.    Baseline  08/31/18 R/L flexion: 5/5 ER 5/5 IR 4+/4+ ABD 5/5 Ext 4+/4    Time  8    Period  Weeks    Status  Achieved      PT LONG TERM GOAL #3   Title  Patient will report a worst pain of 5/10 with transfers of her husband to improve tolerance with ADLs     Baseline  02/01/19 7/10 pain with transfers in the LB, some increased L shoulder pain with transfers; 03/19/19 continued 7/10 pain with transfers    Time  8    Period  Weeks    Status  On-going      PT LONG TERM GOAL #4   Title  Pt will decrease 5TSTS to 10sec order to demonstrate clinically significant improvement in LE strength, and reduced fall risk    Baseline  03/19/19 10.6sec 02/01/19 10.76sec 12/07/18  17sec    Time  8    Period  Weeks    Status  On-going      PT LONG TERM GOAL #5   Title  Pt will increase 10MWT by at least 0.13 m/s in order to demonstrate clinically significant improvement in community  ambulation    Baseline  02/01/19 m/s 09/25/18 0.83 07/31/18 0.39m/s 07/20/18 same 06/10/18 0.51m/s    Time  8    Period  Weeks    Status  Achieved      PT LONG TERM GOAL #6   Title  Patient will demonstrate walk speed of 1.2 to demonstrate community ambulation norm    Baseline  03/19/19 1.85m/s 02/01/19 0.30m/s 12/07/18 0.68m/s 10/12/18 0.25m/s 09/25/18 0.68m/s    Time  8    Period  Weeks    Status  On-going      PT LONG TERM GOAL #7   Title  Patient will increase standing tolerance to 41min without increased pain in order to complete household chores/cooking    Baseline  03/19/19 can stand about 81mins before needing to sit for a break 02/01/19 about 86mins before needing a break11/16/20 Can stand only stand for 15 mins d/t decreased tolerance    Time  8    Period  Weeks    Status  On-going      PT LONG TERM GOAL #8   Title  Patient will demonstrate SLS of 10sec to demonstrate decreased fall risk    Baseline  03/19/19 RLE 9.28 LLE 8.5 02/01/19 RLE 8.5sec  LLE 9sec 12/07/18 RLE 4sec LLE 4.2sec 11/19/18 RLE 4sec LLE 6sec    Time  8    Period  Weeks    Status  On-going            Plan - 04/09/19 1104    Clinical Impression Statement  PT continued therex progression for increased postural musculature endurance with good success. Patient is increasing standing tolerance with therex with cuing needed for upright posture, with difficulty maintaining. PT will continue progression as able.    Personal Factors and Comorbidities  Age;Behavior Pattern;Fitness;Past/Current Experience;Comorbidity 1;Comorbidity 2;Comorbidity 3+    Comorbidities  scolosis, DM1, chronic pain syndrome, HTN, afib, depression    Examination-Activity Limitations  Bathing;Dressing;Transfers;Caring for  Others;Carry;Squat;Lift;Bend;Bed Mobility    Examination-Participation Restrictions  Yard Work;Laundry;Cleaning;Community Activity    Stability/Clinical Decision Making  Evolving/Moderate complexity    Clinical Decision Making  Moderate    Rehab Potential  Fair    Clinical Impairments Affecting Rehab Potential  comorbidities, symptom duration, curvature degree    PT Frequency  2x / week    PT Duration  8 weeks    PT Treatment/Interventions  ADLs/Self Care Home Management;Aquatic Therapy;Cryotherapy;Ultrasound;Parrafin;Traction;Moist Heat;Electrical Stimulation;DME Instruction;Gait training;Stair training;Functional mobility training;Neuromuscular re-education;Balance training;Therapeutic exercise;Therapeutic activities;Patient/family education;Energy conservation;Spinal Manipulations;Joint Manipulations;Passive range of motion;Dry needling;Manual techniques    PT Next Visit Plan  Progress Hip and core strengthening as tolerated    PT Home Exercise Plan  Added standing pec stretch in doorway to HEP today    Consulted and Agree with Plan of Care  Patient       Patient will benefit from skilled therapeutic intervention in order to improve the following deficits and impairments:  Abnormal gait, Decreased balance, Decreased endurance, Decreased mobility, Difficulty walking, Hypomobility, Increased muscle spasms, Decreased range of motion, Pain, Postural dysfunction, Impaired flexibility, Increased fascial restricitons, Decreased strength, Decreased activity tolerance  Visit Diagnosis: Other idiopathic scoliosis, lumbar region  Radiculopathy, lumbar region     Problem List Patient Active Problem List   Diagnosis Date Noted  . Central sleep apnea 10/09/2016  . Hypertension 10/09/2016  . Lumbar postlaminectomy syndrome 10/09/2016  . Lumbar radiculopathy 10/09/2016  . Diabetic polyneuropathy ([place]) 10/07/2016  . Right lumbosacral radiculopathy 10/07/2016  . Healthcare maintenance 08/26/2016   . DKA (  diabetic ketoacidoses) ([place]) 06/14/2016  . Atypical chest pain 02/23/2016  . SOB (shortness of breath) 02/23/2016  . Pain medication agreement signed 01/13/2015  . Acquired hypothyroidism 05/30/2014  . Frontal sinusitis 12/17/2013  . Atrial fibrillation ([place]) 11/29/2013  . Major depression in remission ([place]) 11/20/2013  . 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 Chronic pain syndrome 08/27/2013  . Back pain 07/17/2013  . HTN (hypertension), benign 07/17/2013  . Hyperlipidemia 07/17/2013  . Pancreatic insufficiency 07/17/2013  . Chronic postoperative pain 01/01/2013  . Long term current use of opiate analgesic 11/04/2012  . Hypothyroidism 08/25/2012  . Radiculopathy of leg 05/01/2011   Shelton Silvas PT, DPT Shelton Silvas 04/09/2019, 11:26 AM  [place] [facility] [address], [place], [address] Phone: [phone]   Fax:  [phone]  Name: Colleen Payne MRN: [id number] Date of Birth: 01-12-1951

## 2019-04-12 ENCOUNTER — Ambulatory Visit: Payer: Medicare Other | Admitting: Physical Therapy

## 2019-04-12 ENCOUNTER — Encounter: Payer: Self-pay | Admitting: Physical Therapy

## 2019-04-12 ENCOUNTER — Other Ambulatory Visit: Payer: Self-pay

## 2019-04-12 DIAGNOSIS — M5416 Radiculopathy, lumbar region: Secondary | ICD-10-CM

## 2019-04-12 DIAGNOSIS — M4126 Other idiopathic scoliosis, lumbar region: Secondary | ICD-10-CM | POA: Diagnosis not present

## 2019-04-12 NOTE — Therapy (Signed)
[place] [facility] [address], [place], [address] Phone: [phone]   Fax:  [phone]  Physical Therapy Treatment  Patient Details  Name: Colleen Payne MRN: [id number] Date of Birth: 1950/01/30 No data recorded  Encounter Date: 04/12/2019  PT End of Session - 04/12/19 1051    Visit Number  66    Number of Visits  76    Date for PT Re-Evaluation  06/18/19    Authorization Type  Medicare    Authorization Time Period  7/10    PT Start Time  1030    PT Stop Time  1110    PT Time Calculation (min)  40 min    Activity Tolerance  Patient tolerated treatment well    Behavior During Therapy  [facility] for tasks assessed/performed       Past Medical History:  Diagnosis Date  . Anxiety   . Atrial fibrillation ([place])   . Depression   . Diabetes mellitus without complication (HCC)    Type I   . Headache    stress. 1x/month  . Hypertension   . Insulin pump in place   . Motion sickness    cars  . MVP (mitral valve prolapse)   . Neuromuscular disorder (HCC)    leg weakness s/p back surgeries  . Thyroid disease     Past Surgical History:  Procedure Laterality Date  . BACK SURGERY  2004   3 sugeries between Sept and Nov, fusion L2-S1  . CATARACT EXTRACTION W/PHACO Left 11/23/2014   Procedure: CATARACT EXTRACTION PHACO AND INTRAOCULAR LENS PLACEMENT (IOC);  Surgeon: Leandrew Koyanagi, MD;  Location: [place];  Service: Ophthalmology;  Laterality: Left;  DIABETIC - insulin pump    There were no vitals filed for this visit.  Subjective Assessment - 04/12/19 1032    Subjective  Reports that her back was  feeling better since last visit, but that she was transferring her husband this am and hurt it on the L side, reports she felt a "grab" that has since let go, but reports it feels a little "pinched on the L side"    Pertinent History  Patient is 69 yo female that complains of lumbar pain and inability to  walk erect, as well as hip/abdominal pain. Patient with history of lumbar fusion (L2-S1). Xray results show: Fairly stable appearance of the levoscoliosis of the lower thoracic and of the lumbar spine.Stable lateral subluxation toward the left of L2 with respect L3. Stable partial compression of the body of L1. Patient complains of frustration with physicians, and that her symptoms are worsening over time. 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 Reported that her pain and difficulty worsened after trying to pick her husband up about 1 year ago. Patient mentioned that she has been told that she has "spinal fluid pockets that are leaking". PMH also includes DM I with insulin pump,  afib, HTN,  HLD, chronic pain syndrome, SOB.    Limitations  Standing;Walking;House hold activities;Other (comment)    Diagnostic tests  see exray results: Fairly stable appearance of the levoscoliosis of the lower thoracic and lumbar spine. Stable lateral subluxation toward left of L2 with respect of L3, stable partial compression of the body of L1.    Patient Stated Goals  pain relief, standing erect    Pain Onset  More than a month ago    Pain Onset  More than a month  ago        Ther-Ex -Production designer, theatre/television/film, simultaneous bilateral 1x5 minutes -concurrent with above, BUE wand flexionover 58mins with increased abdominal stretching/pain sensation this am, taking increased time to achieve full motion - Prone press up with RUE only for L openningx15withmin cuing for full press up with good carry over - L sidelying R open book for L openning x15 with 3sec hold, patient reports this feels good - Seated R theraball rollouts for L QL/paraspinal rollout 6x 10sec; 1x 30sec  - Scap retraction + thoracic ext from mat table in standing 2x 12 with PT stabilizing at hips; standing breaks between sets - Mini squat with 6# wt in front 3x 12 with min cuing for scapular retraction and full  stand with good carry over  - Standing trunk rotations on foam with bilat UE short lever with elbows at rib cage 3x 12 (each direction) with cuing to "turn with belly button"With CGA for safety; standing on foam rest between last two sets - Hip hike with CL hip swing x15 each LE -Backwardwalking1.20mph for29mins,cuing forposture and "large steps" with decent carry over. Pt able to maintain more thoracic ext with cuing to "keep eyes up"                         PT Education - 04/12/19 1033    Education Details  therex form, posture    Person(s) Educated  Patient    Methods  Explanation;Demonstration;Tactile cues;Verbal cues    Comprehension  Verbalized understanding;Returned demonstration;Verbal cues required;Tactile cues required       PT Short Term Goals - 07/27/18 0933      PT SHORT TERM GOAL #1   Title  Patient will be adherent to initial HEP at least 3x a week to improve functional strength and balance for better safety at home.    Time  4    Period  Weeks    Status  Achieved    Target Date  12/17/17        PT Long Term Goals - 03/19/19 1051      PT LONG TERM GOAL #1   Title  Patient will be independent in bending down towards floor and picking up small object (<5 pounds) and then stand back up without increased pain to improve ability to pick up and clean up room at home    Baseline  09/25/18/20 able to pick up object without increase in pain, though she reports continued baseline pain, goal revised and achieved    Time  8    Period  Weeks    Status  Achieved      PT LONG TERM GOAL #2   Title  Patient will increase BLE gross strength to 4+/5 as to improve functional strength for independent gait, increased standing tolerance and increased ADL ability.    Baseline  08/31/18 R/L flexion: 5/5 ER 5/5 IR 4+/4+ ABD 5/5 Ext 4+/4    Time  8    Period  Weeks    Status  Achieved      PT LONG TERM GOAL #3   Title  Patient will report a worst pain of 5/10  with transfers of her husband to improve tolerance with ADLs     Baseline  02/01/19 7/10 pain with transfers in the LB, some increased L shoulder pain with transfers; 03/19/19 continued 7/10 pain with transfers    Time  8    Period  Weeks  Status  On-going      PT LONG TERM GOAL #4   Title  Pt will decrease 5TSTS to 10sec order to demonstrate clinically significant improvement in LE strength, and reduced fall risk    Baseline  03/19/19 10.6sec 02/01/19 10.76sec 12/07/18 17sec    Time  8    Period  Weeks    Status  On-going      PT LONG TERM GOAL #5   Title  Pt will increase 10MWT by at least 0.13 m/s in order to demonstrate clinically significant improvement in community ambulation    Baseline  02/01/19 m/s 09/25/18 0.83 07/31/18 0.44m/s 07/20/18 same 06/10/18 0.53m/s    Time  8    Period  Weeks    Status  Achieved      PT LONG TERM GOAL #6   Title  Patient will demonstrate walk speed of 1.2 to demonstrate community ambulation norm    Baseline  03/19/19 1.67m/s 02/01/19 0.32m/s 12/07/18 0.79m/s 10/12/18 0.65m/s 09/25/18 0.88m/s    Time  8    Period  Weeks    Status  On-going      PT LONG TERM GOAL #7   Title  Patient will increase standing tolerance to 64min without increased pain in order to complete household chores/cooking    Baseline  03/19/19 can stand about 7mins before needing to sit for a break 02/01/19 about 28mins before needing a break11/16/20 Can stand only stand for 15 mins d/t decreased tolerance    Time  8    Period  Weeks    Status  On-going      PT LONG TERM GOAL #8   Title  Patient will demonstrate SLS of 10sec to demonstrate decreased fall risk    Baseline  03/19/19 RLE 9.28 LLE 8.5 02/01/19 RLE 8.5sec  LLE 9sec 12/07/18 RLE 4sec LLE 4.2sec 11/19/18 RLE 4sec LLE 6sec    Time  8    Period  Weeks    Status  On-going            Plan - 04/12/19 1057    Clinical Impression Statement  PT continued therex progression for extension endurane, with pain reduction for L sided  thoracic parapsinals with good success. Patient reports decreased pain/tension  in L side of spine following stretching/mobility therex, allowing for continued progression for postural endurance therex and increasing standing tolerance. Patient able to comply with all cuing for technique of therex and is motivated throughout session. PT will continue progression as able.    Personal Factors and Comorbidities  Age;Behavior Pattern;Fitness;Past/Current Experience;Comorbidity 1;Comorbidity 2;Comorbidity 3+    Comorbidities  scolosis, DM1, chronic pain syndrome, HTN, afib, depression    Examination-Activity Limitations  Bathing;Dressing;Transfers;Caring for Others;Carry;Squat;Lift;Bend;Bed Mobility    Examination-Participation Restrictions  Yard Work;Laundry;Cleaning;Community Activity    Stability/Clinical Decision Making  Evolving/Moderate complexity    Clinical Decision Making  Moderate    Rehab Potential  Fair    Clinical Impairments Affecting Rehab Potential  comorbidities, symptom duration, curvature degree    PT Frequency  2x / week    PT Duration  8 weeks    PT Treatment/Interventions  ADLs/Self Care Home Management;Aquatic Therapy;Cryotherapy;Ultrasound;Parrafin;Traction;Moist Heat;Electrical Stimulation;DME Instruction;Gait training;Stair training;Functional mobility training;Neuromuscular re-education;Balance training;Therapeutic exercise;Therapeutic activities;Patient/family education;Energy conservation;Spinal Manipulations;Joint Manipulations;Passive range of motion;Dry needling;Manual techniques    PT Next Visit Plan  Progress Hip and core strengthening as tolerated    PT Home Exercise Plan  Added standing pec stretch in doorway to HEP today    Consulted and Agree with  Plan of Care  Patient       Patient will benefit from skilled therapeutic intervention in order to improve the following deficits and impairments:  Abnormal gait, Decreased balance, Decreased endurance, Decreased mobility,  Difficulty walking, Hypomobility, Increased muscle spasms, Decreased range of motion, Pain, Postural dysfunction, Impaired flexibility, Increased fascial restricitons, Decreased strength, Decreased activity tolerance  Visit Diagnosis: No diagnosis found.     Problem List Patient Active Problem List   Diagnosis Date Noted  . Central sleep apnea 10/09/2016  . Hypertension 10/09/2016  . Lumbar postlaminectomy syndrome 10/09/2016  . Lumbar radiculopathy 10/09/2016  . Diabetic polyneuropathy ([place]) 10/07/2016  . Right lumbosacral radiculopathy 10/07/2016  . Healthcare maintenance 08/26/2016  . DKA (diabetic ketoacidoses) ([place]) 06/14/2016  . Atypical chest pain 02/23/2016  . SOB (shortness of breath) 02/23/2016  . Pain medication agreement signed 01/13/2015  . Acquired hypothyroidism 05/30/2014  . Frontal sinusitis 12/17/2013  . Atrial fibrillation ([place]) 11/29/2013  . Major depression in remission ([place]) 11/20/2013  . 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 Chronic pain syndrome 08/27/2013  . Back pain 07/17/2013  . HTN (hypertension), benign 07/17/2013  . Hyperlipidemia 07/17/2013  . Pancreatic insufficiency 07/17/2013  . Chronic postoperative pain 01/01/2013  . Long term current use of opiate analgesic 11/04/2012  . Hypothyroidism 08/25/2012  . Radiculopathy of leg 05/01/2011   Shelton Silvas PT, DPT Shelton Silvas 04/12/2019, 11:04 AM  [facility] [address] [place], [address] Phone: [phone]   Fax:  [phone]  Name: Colleen Payne MRN: [id number] Date of Birth: 11/10/1950

## 2019-04-16 ENCOUNTER — Other Ambulatory Visit: Payer: Self-pay

## 2019-04-16 ENCOUNTER — Ambulatory Visit: Payer: Medicare Other

## 2019-04-16 DIAGNOSIS — M4126 Other idiopathic scoliosis, lumbar region: Secondary | ICD-10-CM

## 2019-04-16 DIAGNOSIS — M5416 Radiculopathy, lumbar region: Secondary | ICD-10-CM

## 2019-04-16 NOTE — Therapy (Signed)
[facility] [address] [place], [address] Phone: [phone]   Fax:  [phone]  Physical Therapy Treatment  Patient Details  Name: Colleen Payne MRN: [id number] Date of Birth: 02/27/1950 No data recorded  Encounter Date: 04/16/2019  PT End of Session - 04/16/19 1048    Visit Number  62    Number of Visits  106    Date for PT Re-Evaluation  06/18/19    Authorization Type  Medicare    Authorization Time Period  8/10    PT Start Time  1028    PT Stop Time  1108    PT Time Calculation (min)  40 min    Activity Tolerance  Patient tolerated treatment well;No increased pain    Behavior During Therapy  WFL for tasks assessed/performed       Past Medical History:  Diagnosis Date  . Anxiety   . Atrial fibrillation ([place])   . Depression   . Diabetes mellitus without complication (HCC)    Type I   . Headache    stress. 1x/month  . Hypertension   . Insulin pump in place   . Motion sickness    cars  . MVP (mitral valve prolapse)   . Neuromuscular disorder (HCC)    leg weakness s/p back surgeries  . Thyroid disease     Past Surgical History:  Procedure Laterality Date  . BACK SURGERY  2004   3 sugeries between Sept and Nov, fusion L2-S1  . CATARACT EXTRACTION W/PHACO Left 11/23/2014   Procedure: CATARACT EXTRACTION PHACO AND INTRAOCULAR LENS PLACEMENT (IOC);  Surgeon: Leandrew Koyanagi, MD;  Location: [facility];  Service: Ophthalmology;  Laterality: Left;  DIABETIC - insulin pump    There were no vitals filed for this visit.  Subjective Assessment - 04/16/19 1047    Subjective  Pt doing fine today, a bit sleep deprived. Reports her back pain as typical (7/10) also has HA of 7/10.    Pertinent History  Patient is 69 yo female that complains of lumbar pain and inability to walk erect, as well as hip/abdominal pain. Patient with history of lumbar fusion (L2-S1). Xray results show: Fairly  stable appearance of the levoscoliosis of the lower thoracic and of the lumbar spine.Stable lateral subluxation toward the left of L2 with respect L3. Stable partial compression of the body of L1. Patient complains of frustration with physicians, and that her symptoms are worsening over time. 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 Reported that her pain and difficulty worsened after trying to pick her husband up about 1 year ago. Patient mentioned that she has been told that she has "spinal fluid pockets that are leaking". PMH also includes DM I with insulin pump,  afib, HTN,  HLD, chronic pain syndrome, SOB.      INTERVENTION THIS DATE    Ther-Ex  -Thomas Test Stretch, simultaneous bilateral 1x5 minutes - Supine wand flexion overhead with yard stick 1x15  - Prone press up with RUE 1x15 - Resting prone stretch x3 minutes  - Prone I (shoulder extension from neutral) 1x10x3secH (cues for concurrent scapular retraction) - Prone on 1 pillow T (horizontal abduction + scapular retraction) 1x10x3secH (trying to avoid shoulder pain) - Prone on 1 pillow SLR 2x10 bilat (AA/ROM for full available range)   *pt reports 2 weeks of third digit locking in a flexed position, asking for insight. Author deferred to  PT Short Term Goals - 07/27/18 0933      PT SHORT TERM GOAL #1   Title  Patient will be adherent to initial HEP at least 3x a week to improve functional strength and balance for better safety at home.    Time  4    Period  Weeks    Status  Achieved    Target Date  12/17/17        PT Long Term Goals - 03/19/19 1051      PT LONG TERM GOAL #1   Title  Patient will be independent in bending down towards floor and picking up small object (<5 pounds) and then stand back up without increased pain to improve ability to pick up and clean up room at home    Baseline  09/25/18/20 able to pick up object without increase in pain, though she reports  continued baseline pain, goal revised and achieved    Time  8    Period  Weeks    Status  Achieved      PT LONG TERM GOAL #2   Title  Patient will increase BLE gross strength to 4+/5 as to improve functional strength for independent gait, increased standing tolerance and increased ADL ability.    Baseline  08/31/18 R/L flexion: 5/5 ER 5/5 IR 4+/4+ ABD 5/5 Ext 4+/4    Time  8    Period  Weeks    Status  Achieved      PT LONG TERM GOAL #3   Title  Patient will report a worst pain of 5/10 with transfers of her husband to improve tolerance with ADLs     Baseline  02/01/19 7/10 pain with transfers in the LB, some increased L shoulder pain with transfers; 03/19/19 continued 7/10 pain with transfers    Time  8    Period  Weeks    Status  On-going      PT LONG TERM GOAL #4   Title  Pt will decrease 5TSTS to 10sec order to demonstrate clinically significant improvement in LE strength, and reduced fall risk    Baseline  03/19/19 10.6sec 02/01/19 10.76sec 12/07/18 17sec    Time  8    Period  Weeks    Status  On-going      PT LONG TERM GOAL #5   Title  Pt will increase 10MWT by at least 0.13 m/s in order to demonstrate clinically significant improvement in community ambulation    Baseline  02/01/19 m/s 09/25/18 0.83 07/31/18 0.58m/s 07/20/18 same 06/10/18 0.83m/s    Time  8    Period  Weeks    Status  Achieved      PT LONG TERM GOAL #6   Title  Patient will demonstrate walk speed of 1.2 to demonstrate community ambulation norm    Baseline  03/19/19 1.76m/s 02/01/19 0.21m/s 12/07/18 0.16m/s 10/12/18 0.7m/s 09/25/18 0.2m/s    Time  8    Period  Weeks    Status  On-going      PT LONG TERM GOAL #7   Title  Patient will increase standing tolerance to 86min without increased pain in order to complete household chores/cooking    Baseline  03/19/19 can stand about 54mins before needing to sit for a break 02/01/19 about 59mins before needing a break11/16/20 Can stand only stand for 15 mins d/t decreased tolerance     Time  8    Period  Weeks    Status  On-going  PT LONG TERM GOAL #8   Title  Patient will demonstrate SLS of 10sec to demonstrate decreased fall risk    Baseline  03/19/19 RLE 9.28 LLE 8.5 02/01/19 RLE 8.5sec  LLE 9sec 12/07/18 RLE 4sec LLE 4.2sec 11/19/18 RLE 4sec LLE 6sec    Time  8    Period  Weeks    Status  On-going            Plan - 04/16/19 1048    Clinical Impression Statement Continued with current program, still focusing on mobility in hips and spine and concurrnent improvement in core stabilizaton and motor control. Pt did most of session in prone this date, a pillow added to to trunk pelvis to allow better ROM of shoulder and hips. Pt able to get very close to neutral now when in prone, but still struggles with actively achieving this in standing.  Pt continues to make steady, gradual progress.      Comorbidities  scolosis, DM1, chronic pain syndrome, HTN, afib, depression    Examination-Activity Limitations  Bathing;Dressing;Transfers;Caring for Others;Carry;Squat;Lift;Bend;Bed Mobility    Examination-Participation Restrictions  Yard Work;Laundry;Cleaning;Community Activity    Stability/Clinical Decision Making  Evolving/Moderate complexity    Clinical Decision Making  Moderate    Rehab Potential  Fair    Clinical Impairments Affecting Rehab Potential  comorbidities, symptom duration, curvature degree    PT Frequency  2x / week    PT Duration  8 weeks    PT Treatment/Interventions  ADLs/Self Care Home Management;Aquatic Therapy;Cryotherapy;Ultrasound;Parrafin;Traction;Moist Heat;Electrical Stimulation;DME Instruction;Gait training;Stair training;Functional mobility training;Neuromuscular re-education;Balance training;Therapeutic exercise;Therapeutic activities;Patient/family education;Energy conservation;Spinal Manipulations;Joint Manipulations;Passive range of motion;Dry needling;Manual techniques    PT Next Visit Plan  Progress Hip and core strengthening as  tolerated    PT Home Exercise Plan  Added standing pec stretch in doorway to HEP today       Patient will benefit from skilled therapeutic intervention in order to improve the following deficits and impairments:  Abnormal gait, Decreased balance, Decreased endurance, Decreased mobility, Difficulty walking, Hypomobility, Increased muscle spasms, Decreased range of motion, Pain, Postural dysfunction, Impaired flexibility, Increased fascial restricitons, Decreased strength, Decreased activity tolerance  Visit Diagnosis: Other idiopathic scoliosis, lumbar region  Radiculopathy, lumbar region     Problem List Patient Active Problem List   Diagnosis Date Noted  . Central sleep apnea 10/09/2016  . Hypertension 10/09/2016  . Lumbar postlaminectomy syndrome 10/09/2016  . Lumbar radiculopathy 10/09/2016  . Diabetic polyneuropathy ([place]) 10/07/2016  . Right lumbosacral radiculopathy 10/07/2016  . Healthcare maintenance 08/26/2016  . DKA (diabetic ketoacidoses) ([place]) 06/14/2016  . Atypical chest pain 02/23/2016  . SOB (shortness of breath) 02/23/2016  . Pain medication agreement signed 01/13/2015  . Acquired hypothyroidism 05/30/2014  . Frontal sinusitis 12/17/2013  . Atrial fibrillation ([place]) 11/29/2013  . Major depression in remission ([place]) 11/20/2013  . 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 Chronic pain syndrome 08/27/2013  . Back pain 07/17/2013  . HTN (hypertension), benign 07/17/2013  . Hyperlipidemia 07/17/2013  . Pancreatic insufficiency 07/17/2013  . Chronic postoperative pain 01/01/2013  . Long term current use of opiate analgesic 11/04/2012  . Hypothyroidism 08/25/2012  . Radiculopathy of leg 05/01/2011   11:19 AM, 04/16/19 Etta Grandchild, PT, DPT Physical Therapist - [facility] [phone] (Office)    Stanislaus Kaltenbach C 04/16/2019, 10:51 AM  [facility] [address] [place], [address] Phone: [phone]   Fax:  [phone]  Name: Colleen Payne MRN: [id number] Date of Birth: 1950/04/09

## 2019-04-19 ENCOUNTER — Ambulatory Visit: Payer: Medicare Other | Admitting: Physical Therapy

## 2019-04-23 ENCOUNTER — Encounter: Payer: Self-pay | Admitting: Physical Therapy

## 2019-04-23 ENCOUNTER — Ambulatory Visit: Payer: Medicare Other | Attending: Anesthesiology | Admitting: Physical Therapy

## 2019-04-23 ENCOUNTER — Other Ambulatory Visit: Payer: Self-pay

## 2019-04-23 DIAGNOSIS — M4126 Other idiopathic scoliosis, lumbar region: Secondary | ICD-10-CM | POA: Insufficient documentation

## 2019-04-23 DIAGNOSIS — M5416 Radiculopathy, lumbar region: Secondary | ICD-10-CM | POA: Diagnosis present

## 2019-04-23 NOTE — Therapy (Signed)
[facility] [address], [place], [address] Phone: [phone]   Fax:  [phone]  Physical Therapy Treatment  Patient Details  Name: Colleen Payne MRN: [id number] Date of Birth: [birth year] No data recorded  Encounter Date: 04/23/2019  PT End of Session - 04/23/19 1046    Visit Number  72    Number of Visits  92    Date for PT Re-Evaluation  06/18/19    Authorization Type  Medicare    Authorization Time Period  9/10    PT Start Time  1040    PT Stop Time  1120    PT Time Calculation (min)  40 min    Activity Tolerance  Patient tolerated treatment well;No increased pain    Behavior During Therapy  WFL for tasks assessed/performed       Past Medical History:  Diagnosis Date  . Anxiety   . Atrial fibrillation ([place])   . Depression   . Diabetes mellitus without complication (HCC)    Type I   . Headache    stress. 1x/month  . Hypertension   . Insulin pump in place   . Motion sickness    cars  . MVP (mitral valve prolapse)   . Neuromuscular disorder (HCC)    leg weakness s/p back surgeries  . Thyroid disease     Past Surgical History:  Procedure Laterality Date  . BACK SURGERY  2004   3 sugeries between Sept and Nov, fusion L2-S1  . CATARACT EXTRACTION W/PHACO Left 11/23/2014   Procedure: CATARACT EXTRACTION PHACO AND INTRAOCULAR LENS PLACEMENT (IOC);  Surgeon: Leandrew Koyanagi, MD;  Location: [facility];  Service: Ophthalmology;  Laterality: Left;  DIABETIC - insulin pump    There were no vitals filed for this visit.  Subjective Assessment - 04/23/19 1044    Subjective  Reports she is fatigued today, L sided LBP that is normal 8/10 today. Compliance with HEP    Pertinent History  Patient is 69 yo female that complains of lumbar pain and inability to walk erect, as well as hip/abdominal pain. Patient with history of lumbar fusion (L2-S1). Xray results show: Fairly stable  appearance of the levoscoliosis of the lower thoracic and of the lumbar spine.Stable lateral subluxation toward the left of L2 with respect L3. Stable partial compression of the body of L1. Patient complains of frustration with physicians, and that her symptoms are worsening over time. 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 Reported that her pain and difficulty worsened after trying to pick her husband up about 1 year ago. Patient mentioned that she has been told that she has "spinal fluid pockets that are leaking". PMH also includes DM I with insulin pump,  afib, HTN,  HLD, chronic pain syndrome, SOB.    Limitations  Standing;Walking;House hold activities;Other (comment)    Diagnostic tests  see exray results: Fairly stable appearance of the levoscoliosis of the lower thoracic and lumbar spine. Stable lateral subluxation toward left of L2 with respect of L3, stable partial compression of the body of L1.    Patient Stated Goals  pain relief, standing erect    Pain Onset  More than a month ago    Pain Onset  More than a month ago       Ther-Ex -[employer], simultaneous bilateral 1x2 minutes -concurrent with above, BUE wand flexionover 65mins with increased abdominal stretching/pain sensation this am, taking  increased time to achieve full motion - Prone press up with RUE only for L openningx15withmin cuing for full press up with good carry over - [employer] ext hold 3x 15sec with PT stabilization at BLEs with cuing to maintain scapular retraction with good carry over following - Alt bird dogs 3x 10 (5 each) with childs pose between sets 3x 30sec, some TC at pelvic to prevent LOB - Scap retraction + thoracic ext from mat table in standing 2x 12 with PT stabilizing at hips; standing breaks between sets - Mini squat with 6# wt in front x12; 7# 3x 12 with min cuing for scapular retraction and full stand with good carry over - Hip hike with  CL hip swing x15 each LE -Backwardwalking1.78mph for70mins,cuing forposture and "large steps" with decent carry over. Pt able to maintain more thoracic ext with cuing to "keep eyes up"                        PT Education - 04/23/19 1046    Education Details  therex form, posture    Person(s) Educated  Patient    Methods  Explanation;Demonstration;Verbal cues    Comprehension  Verbalized understanding;Returned demonstration;Verbal cues required       PT Short Term Goals - 07/27/18 0933      PT SHORT TERM GOAL #1   Title  Patient will be adherent to initial HEP at least 3x a week to improve functional strength and balance for better safety at home.    Time  4    Period  Weeks    Status  Achieved    Target Date  12/17/17        PT Long Term Goals - 03/19/19 1051      PT LONG TERM GOAL #1   Title  Patient will be independent in bending down towards floor and picking up small object (<5 pounds) and then stand back up without increased pain to improve ability to pick up and clean up room at home    Baseline  09/25/18/20 able to pick up object without increase in pain, though she reports continued baseline pain, goal revised and achieved    Time  8    Period  Weeks    Status  Achieved      PT LONG TERM GOAL #2   Title  Patient will increase BLE gross strength to 4+/5 as to improve functional strength for independent gait, increased standing tolerance and increased ADL ability.    Baseline  08/31/18 R/L flexion: 5/5 ER 5/5 IR 4+/4+ ABD 5/5 Ext 4+/4    Time  8    Period  Weeks    Status  Achieved      PT LONG TERM GOAL #3   Title  Patient will report a worst pain of 5/10 with transfers of her husband to improve tolerance with ADLs     Baseline  02/01/19 7/10 pain with transfers in the LB, some increased L shoulder pain with transfers; 03/19/19 continued 7/10 pain with transfers    Time  8    Period  Weeks    Status  On-going      PT LONG TERM GOAL #4   Title   Pt will decrease 5TSTS to 10sec order to demonstrate clinically significant improvement in LE strength, and reduced fall risk    Baseline  03/19/19 10.6sec 02/01/19 10.76sec 12/07/18 17sec    Time  8    Period  [place]  Status  On-going      PT LONG TERM GOAL #5   Title  Pt will increase 10MWT by at least 0.13 m/s in order to demonstrate clinically significant improvement in community ambulation    Baseline  02/01/19 m/s 09/25/18 0.83 07/31/18 0.79m/s 07/20/18 same 06/10/18 0.70m/s    Time  8    Period  Weeks    Status  Achieved      PT LONG TERM GOAL #6   Title  Patient will demonstrate walk speed of 1.2 to demonstrate community ambulation norm    Baseline  03/19/19 1.75m/s 02/01/19 0.57m/s 12/07/18 0.99m/s 10/12/18 0.75m/s 09/25/18 0.50m/s    Time  8    Period  Weeks    Status  On-going      PT LONG TERM GOAL #7   Title  Patient will increase standing tolerance to 23min without increased pain in order to complete household chores/cooking    Baseline  03/19/19 can stand about 36mins before needing to sit for a break 02/01/19 about 51mins before needing a break11/16/20 Can stand only stand for 15 mins d/t decreased tolerance    Time  8    Period  Weeks    Status  On-going      PT LONG TERM GOAL #8   Title  Patient will demonstrate SLS of 10sec to demonstrate decreased fall risk    Baseline  03/19/19 RLE 9.28 LLE 8.5 02/01/19 RLE 8.5sec  LLE 9sec 12/07/18 RLE 4sec LLE 4.2sec 11/19/18 RLE 4sec LLE 6sec    Time  8    Period  Weeks    Status  On-going            Plan - 04/23/19 1117    Clinical Impression Statement  Continued progression for spine mobility and extension strength with good success. Increased standing therex/tolerance with success. Patietn with no increased pain throughout session, able to comply with all cuing for proper techniques and motivated throughout session. Will continue progression as able.    Personal Factors and Comorbidities  Age;Behavior Pattern;Fitness;Past/Current  Experience;Comorbidity 1;Comorbidity 2;Comorbidity 3+    Comorbidities  scolosis, DM1, chronic pain syndrome, HTN, afib, depression    Examination-Activity Limitations  Bathing;Dressing;Transfers;Caring for Others;Carry;Squat;Lift;Bend;Bed Mobility    Examination-Participation Restrictions  Yard Work;Laundry;Cleaning;Community Activity    Stability/Clinical Decision Making  Evolving/Moderate complexity    Clinical Decision Making  Moderate    Rehab Potential  Fair    Clinical Impairments Affecting Rehab Potential  comorbidities, symptom duration, curvature degree    PT Frequency  2x / week    PT Duration  8 weeks    PT Treatment/Interventions  ADLs/Self Care Home Management;Aquatic Therapy;Cryotherapy;Ultrasound;Parrafin;Traction;Moist Heat;Electrical Stimulation;DME Instruction;Gait training;Stair training;Functional mobility training;Neuromuscular re-education;Balance training;Therapeutic exercise;Therapeutic activities;Patient/family education;Energy conservation;Spinal Manipulations;Joint Manipulations;Passive range of motion;Dry needling;Manual techniques    PT Next Visit Plan  Progress Hip and core strengthening as tolerated    PT Home Exercise Plan  Added standing pec stretch in doorway to HEP today    Consulted and Agree with Plan of Care  Patient       Patient will benefit from skilled therapeutic intervention in order to improve the following deficits and impairments:  Abnormal gait, Decreased balance, Decreased endurance, Decreased mobility, Difficulty walking, Hypomobility, Increased muscle spasms, Decreased range of motion, Pain, Postural dysfunction, Impaired flexibility, Increased fascial restricitons, Decreased strength, Decreased activity tolerance  Visit Diagnosis: Other idiopathic scoliosis, lumbar region  Radiculopathy, lumbar region     Problem List Patient Active Problem List   Diagnosis Date Noted  .  Central sleep apnea 10/09/2016  . Hypertension 10/09/2016  .  Lumbar postlaminectomy syndrome 10/09/2016  . Lumbar radiculopathy 10/09/2016  . Diabetic polyneuropathy ([place]) 10/07/2016  . Right lumbosacral radiculopathy 10/07/2016  . Healthcare maintenance 08/26/2016  . DKA (diabetic ketoacidoses) ([place]) 06/14/2016  . Atypical chest pain 02/23/2016  . SOB (shortness of breath) 02/23/2016  . Pain medication agreement signed 01/13/2015  . Acquired hypothyroidism 05/30/2014  . Frontal sinusitis 12/17/2013  . Atrial fibrillation ([place]) 11/29/2013  . Major depression in remission ([place]) 11/20/2013  . 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 Chronic pain syndrome 08/27/2013  . Back pain 07/17/2013  . HTN (hypertension), benign 07/17/2013  . Hyperlipidemia 07/17/2013  . Pancreatic insufficiency 07/17/2013  . Chronic postoperative pain 01/01/2013  . Long term current use of opiate analgesic 11/04/2012  . Hypothyroidism 08/25/2012  . Radiculopathy of leg 05/01/2011   Shelton Silvas PT, DPT Shelton Silvas 04/23/2019, 11:26 AM  [facility] [address] [place], [address] Phone: [phone]   Fax:  [phone]  Name: Colleen Payne MRN: [id number] Date of Birth: 1950-01-25

## 2019-04-26 ENCOUNTER — Other Ambulatory Visit: Payer: Self-pay

## 2019-04-26 ENCOUNTER — Encounter: Payer: Self-pay | Admitting: Physical Therapy

## 2019-04-26 ENCOUNTER — Ambulatory Visit: Payer: Medicare Other | Admitting: Physical Therapy

## 2019-04-26 DIAGNOSIS — M4126 Other idiopathic scoliosis, lumbar region: Secondary | ICD-10-CM

## 2019-04-26 DIAGNOSIS — M5416 Radiculopathy, lumbar region: Secondary | ICD-10-CM

## 2019-04-26 NOTE — Therapy (Signed)
[facility] [address], [place], [address] Phone: [phone]   Fax:  [phone]  Physical Therapy Treatment/Progress Report Reporting Period 03/19/19 - 04/26/19  Patient Details  Name: Colleen Payne MRN: [id number] Date of Birth: 1950-10-28 No data recorded  Encounter Date: 04/26/2019  PT End of Session - 04/26/19 1036    Visit Number  15    Number of Visits  92    Date for PT Re-Evaluation  06/18/19    Authorization Type  Medicare    Authorization Time Period  10/10    PT Start Time  1030    PT Stop Time  1113    PT Time Calculation (min)  43 min    Activity Tolerance  Patient tolerated treatment well;No increased pain    Behavior During Therapy  WFL for tasks assessed/performed       Past Medical History:  Diagnosis Date  . Anxiety   . Atrial fibrillation ([place])   . Depression   . Diabetes mellitus without complication (HCC)    Type I   . Headache    stress. 1x/month  . Hypertension   . Insulin pump in place   . Motion sickness    cars  . MVP (mitral valve prolapse)   . Neuromuscular disorder (HCC)    leg weakness s/p back surgeries  . Thyroid disease     Past Surgical History:  Procedure Laterality Date  . BACK SURGERY  2004   3 sugeries between Sept and Nov, fusion L2-S1  . CATARACT EXTRACTION W/PHACO Left 11/23/2014   Procedure: CATARACT EXTRACTION PHACO AND INTRAOCULAR LENS PLACEMENT (IOC);  Surgeon: Leandrew Koyanagi, MD;  Location: [facility];  Service: Ophthalmology;  Laterality: Left;  DIABETIC - insulin pump    There were no vitals filed for this visit.  Subjective Assessment - 04/26/19 1033    Subjective  Reports she "stayed up pretty good over the weekend" until last night. Reports pain improvement to 6/10. Compliance with HEP.    Pertinent History  Patient is 69 yo female that complains of lumbar pain and inability to walk erect, as well as hip/abdominal pain.  Patient with history of lumbar fusion (L2-S1). Xray results show: Fairly stable appearance of the levoscoliosis of the lower thoracic and of the lumbar spine.Stable lateral subluxation toward the left of L2 with respect L3. Stable partial compression of the body of L1. Patient complains of frustration with physicians, and that her symptoms are worsening over time. 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 Reported that her pain and difficulty worsened after trying to pick her husband up about 1 year ago. Patient mentioned that she has been told that she has "spinal fluid pockets that are leaking". PMH also includes DM I with insulin pump,  afib, HTN,  HLD, chronic pain syndrome, SOB.    Limitations  Standing;Walking;House hold activities;Other (comment)    Diagnostic tests  see exray results: Fairly stable appearance of the levoscoliosis of the lower thoracic and lumbar spine. Stable lateral subluxation toward left of L2 with respect of L3, stable partial compression of the body of L1.    Patient Stated Goals  pain relief, standing erect    Pain Onset  More than a month ago       Ther-Ex -Motorola, simultaneous bilateral 1x2 minutes -concurrent with above, BUE wand flexionover 18mns with increased abdominal stretching/pain sensation this am, taking increased  time to achieve full motion - Cat/cow x12 with cuing initially for breath control with mobility for rib cage expansion with good carry over - Alt bird dogs 3x 12 (6 each) with childs pose between sets 3x 30sec, min TC at pelvic to prevent LOB 5x sts x2 trials 10sec 10.1sec 30sec sts trial 1: 14 SLS trials with difficulty this session  - Scap retraction + thoracic ext from mat table in standing 3x 12 with PT stabilizing at hips; standing breaks between sets - Hip hike with CL hip swing 2 x15 each LE, min cuing for posture d/t fatigue; standing rest between   Not billed 16mt x2 10sec  1.058m                      PT Education - 04/26/19 1035    Education Details  therex form; posture; HEP update    Person(s) Educated  Patient    Methods  Explanation;Demonstration;Verbal cues    Comprehension  Verbalized understanding;Returned demonstration;Verbal cues required       PT Short Term Goals - 07/27/18 0933      PT SHORT TERM GOAL #1   Title  Patient will be adherent to initial HEP at least 3x a week to improve functional strength and balance for better safety at home.    Time  4    Period  Weeks    Status  Achieved    Target Date  12/17/17        PT Long Term Goals - 04/26/19 1036      PT LONG TERM GOAL #1   Title  Patient will be independent in bending down towards floor and picking up small object (<5 pounds) and then stand back up without increased pain to improve ability to pick up and clean up room at home    Baseline  09/25/18/20 able to pick up object without increase in pain, though she reports continued baseline pain, goal revised and achieved    Time  8    Period  Weeks    Status  Achieved      PT LONG TERM GOAL #2   Title  Patient will increase BLE gross strength to 4+/5 as to improve functional strength for independent gait, increased standing tolerance and increased ADL ability.    Baseline  08/31/18 R/L flexion: 5/5 ER 5/5 IR 4+/4+ ABD 5/5 Ext 4+/4    Time  8    Period  Weeks    Status  Achieved      PT LONG TERM GOAL #3   Title  Patient will report a worst pain of 5/10 with transfers of her husband to improve tolerance with ADLs     Baseline  02/01/19 7/10 pain with transfers in the LB, some increased L shoulder pain with transfers; 03/19/19 continued 7/10 pain with transfers; 04/26/19 6/10 pain over the past week with activity    Time  8    Period  Weeks    Status  On-going      PT LONG TERM GOAL #4   Title  Pt will decrease 5TSTS to 10sec order to demonstrate clinically significant improvement in LE strength, and reduced fall  risk    Baseline  04/26/19 10sec 03/19/19 10.6sec 02/01/19 10.76sec 12/07/18 17sec; 04/26/19    Time  8    Period  Weeks    Status  Achieved      PT LONG TERM GOAL #5   Title  Pt will increase 10MWT  by at least 0.13 m/s in order to demonstrate clinically significant improvement in community ambulation    Baseline  02/01/19 m/s 09/25/18 0.83 07/31/18 0.23ms 07/20/18 same 06/10/18 0.743m    Time  8    Period  Weeks    Status  Achieved      Additional Long Term Goals   Additional Long Term Goals  Yes      PT LONG TERM GOAL #6   Title  Patient will demonstrate walk speed of 1.2 to demonstrate community ambulation norm    Baseline  03/26/19 1.14m52m2/26/21 1.14m/64m/11/21 0.914m/414m/16/20 0.52m/s64m1/20 0.85m/s 952m20 0.73m/s  685mme  8    Period  Weeks    Status  On-going      PT LONG TERM GOAL #7   Title  Patient will increase standing tolerance to 314min wi44mt increased pain in order to complete household chores/cooking    Baseline  03/19/19 can stand about 214mins be49m needing to sit for a break 02/01/19 about 15mins bef714mneeding a break11/16/20 Can stand only stand for 15 mins d/t decreased tolerance; 04/26/19 Can stand for 314min to co21mte ADLs with increased pain    Time  8    Period  Weeks    Status  Partially Met      PT LONG TERM GOAL #8   Title  Patient will demonstrate SLS of 10sec to demonstrate decreased fall risk    Baseline  04/26/19 RLE 7sec LLE 4sec 03/19/19 RLE 9.28 LLE 8.5 02/01/19 RLE 8.5sec  LLE 9sec 12/07/18 RLE 4sec LLE 4.2sec 11/19/18 RLE 4sec LLE 6sec    Time  8    Period  Weeks    Status  On-going      PT LONG TERM GOAL  #9   TITLE  Patient will demonstrate 15 STS in 30sec to demonstrate age-matched increase in LE endurance    Baseline  04/26/19 14    Time  8    Period  Weeks    Status  New            Plan - 04/26/19 1109    Clinical Impression Statement  PT reassessed goals this session for medicare requirements where patient is making strong progress toward  goals. Patient has met most strength goals, with continue deficits in endurance, though this is improving. Patient is increasing walk speed, and standing/tolerance, but continues to need cuing for motor  control and encouragement for endurance rep ranges of therex. PT will continue progression as able.    Personal Factors and Comorbidities  Age;Behavior Pattern;Fitness;Past/Current Experience;Comorbidity 1;Comorbidity 2;Comorbidity 3+    Comorbidities  scolosis, DM1, chronic pain syndrome, HTN, afib, depression    Examination-Activity Limitations  Bathing;Dressing;Transfers;Caring for Others;Carry;Squat;Lift;Bend;Bed Mobility    Examination-Participation Restrictions  Yard Work;Laundry;Cleaning;Community Activity    Stability/Clinical Decision Making  Evolving/Moderate complexity    Clinical Decision Making  Moderate    Rehab Potential  Fair    Clinical Impairments Affecting Rehab Potential  comorbidities, symptom duration, curvature degree    PT Frequency  2x / week    PT Duration  8 weeks    PT Treatment/Interventions  ADLs/Self Care Home Management;Aquatic Therapy;Cryotherapy;Ultrasound;Parrafin;Traction;Moist Heat;Electrical Stimulation;DME Instruction;Gait training;Stair training;Functional mobility training;Neuromuscular re-education;Balance training;Therapeutic exercise;Therapeutic activities;Patient/family education;Energy conservation;Spinal Manipulations;Joint Manipulations;Passive range of motion;Dry needling;Manual techniques    PT Next Visit Plan  Progress Hip and core strengthening as tolerated    PT Home Exercise Plan  Added standing pec stretch in doorway to HEP today    Consulted  and Agree with Plan of Care  Patient       Patient will benefit from skilled therapeutic intervention in order to improve the following deficits and impairments:  Abnormal gait, Decreased balance, Decreased endurance, Decreased mobility, Difficulty walking, Hypomobility, Increased muscle spasms, Decreased  range of motion, Pain, Postural dysfunction, Impaired flexibility, Increased fascial restricitons, Decreased strength, Decreased activity tolerance  Visit Diagnosis: Other idiopathic scoliosis, lumbar region  Radiculopathy, lumbar region     Problem List Patient Active Problem List   Diagnosis Date Noted  . Central sleep apnea 10/09/2016  . Hypertension 10/09/2016  . Lumbar postlaminectomy syndrome 10/09/2016  . Lumbar radiculopathy 10/09/2016  . Diabetic polyneuropathy ([place]) 10/07/2016  . Right lumbosacral radiculopathy 10/07/2016  . Healthcare maintenance 08/26/2016  . DKA (diabetic ketoacidoses) ([place]) 06/14/2016  . Atypical chest pain 02/23/2016  . SOB (shortness of breath) 02/23/2016  . Pain medication agreement signed 01/13/2015  . Acquired hypothyroidism 05/30/2014  . Frontal sinusitis 12/17/2013  . Atrial fibrillation ([place]) 11/29/2013  . Major depression in remission ([place]) 11/20/2013  . 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 Chronic pain syndrome 08/27/2013  . Back pain 07/17/2013  . HTN (hypertension), benign 07/17/2013  . Hyperlipidemia 07/17/2013  . Pancreatic insufficiency 07/17/2013  . Chronic postoperative pain 01/01/2013  . Long term current use of opiate analgesic 11/04/2012  . Hypothyroidism 08/25/2012  . Radiculopathy of leg 05/01/2011   Shelton Silvas PT, DPT Shelton Silvas 04/26/2019, 3:58 PM  [facility] [address] [place], [address] Phone: [phone]   Fax:  [phone]  Name: Colleen Payne MRN: [id number] Date of Birth: Mar 18, 1950

## 2019-04-30 ENCOUNTER — Ambulatory Visit: Payer: Medicare Other | Admitting: Physical Therapy

## 2019-04-30 ENCOUNTER — Encounter: Payer: Self-pay | Admitting: Physical Therapy

## 2019-04-30 ENCOUNTER — Other Ambulatory Visit: Payer: Self-pay

## 2019-04-30 DIAGNOSIS — M5416 Radiculopathy, lumbar region: Secondary | ICD-10-CM

## 2019-04-30 DIAGNOSIS — M4126 Other idiopathic scoliosis, lumbar region: Secondary | ICD-10-CM

## 2019-04-30 NOTE — Therapy (Signed)
[facility] [address], [place], [address] Phone: [phone]   Fax:  [phone]  Physical Therapy Treatment  Patient Details  Name: Colleen Payne MRN: [id number] Date of Birth: [birth year] No data recorded  Encounter Date: 04/30/2019  PT End of Session - 04/30/19 1056    Visit Number  70    Number of Visits  92    Date for PT Re-Evaluation  06/18/19    Authorization Type  Medicare    Authorization Time Period  1/10    PT Start Time  1045    PT Stop Time  1128    PT Time Calculation (min)  43 min    Activity Tolerance  Patient tolerated treatment well;No increased pain    Behavior During Therapy  WFL for tasks assessed/performed       Past Medical History:  Diagnosis Date  . Anxiety   . Atrial fibrillation ([place])   . Depression   . Diabetes mellitus without complication (HCC)    Type I   . Headache    stress. 1x/month  . Hypertension   . Insulin pump in place   . Motion sickness    cars  . MVP (mitral valve prolapse)   . Neuromuscular disorder ([facility])    leg weakness s/p back surgeries  . Thyroid disease     Past Surgical History:  Procedure Laterality Date  . BACK SURGERY  2004   3 sugeries between Sept and Nov, fusion L2-S1  . CATARACT EXTRACTION W/PHACO Left 11/23/2014   Procedure: CATARACT EXTRACTION PHACO AND INTRAOCULAR LENS PLACEMENT (IOC);  Surgeon: Leandrew Koyanagi, MD;  Location: [facility];  Service: Ophthalmology;  Laterality: Left;  DIABETIC - insulin pump    There were no vitals filed for this visit.  Subjective Assessment - 04/30/19 1051    Subjective  Reports 7/10 pain today, that she is struggling with her  depression today, and has been having trouble sleeping. Reports this may be d/t her husband recieving a skin cancer diagnosis    Pertinent History  Patient is 69 yo female that complains of lumbar pain and inability to walk erect, as well as hip/abdominal  pain. Patient with history of lumbar fusion (L2-S1). Xray results show: Fairly stable appearance of the levoscoliosis of the lower thoracic and of the lumbar spine.Stable lateral subluxation toward the left of L2 with respect L3. Stable partial compression of the body of L1. Patient complains of frustration with physicians, and that her symptoms are worsening over time. 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 Reported that her pain and difficulty worsened after trying to pick her husband up about 1 year ago. Patient mentioned that she has been told that she has "spinal fluid pockets that are leaking". PMH also includes DM I with insulin pump,  afib, HTN,  HLD, chronic pain syndrome, SOB.    Limitations  Standing;Walking;House hold activities;Other (comment)    Diagnostic tests  see exray results: Fairly stable appearance of the levoscoliosis of the lower thoracic and lumbar spine. Stable lateral subluxation toward left of L2 with respect of L3, stable partial compression of the body of L1.    Patient Stated Goals  pain relief, standing erect    Pain Onset  More than a month ago    Pain Onset  More than a month ago           Ther-Ex -Motorola,  simultaneous bilateral 1x2 minutes -concurrent with above, BUE wand flexionover 32mns with increased abdominal stretching/pain sensation this am, taking increased time to achieve full motion - Prone press up2x 12 with encouragement needed - Prone supermans 2x 10 (each side, 20 total) with cuing for increased hip ext with good carry over - Mini squat with6# wt in front  3x 12with min cuing for higher eye gaze to aid in full stand with good carry over - Hip hike with CL hip swing x15 each LE -Backwardwalking1.058m for4m79m,cuing forposture and "large steps" with decent carry over. Pt able to maintain more thoracic ext with cuing to "keep eyes up"                     PT  Education - 04/30/19 1052    Education Details  therex form, posture    Person(s) Educated  Patient    Methods  Explanation;Demonstration;Verbal cues    Comprehension  Verbalized understanding;Returned demonstration;Verbal cues required       PT Short Term Goals - 07/27/18 0933      PT SHORT TERM GOAL #1   Title  Patient will be adherent to initial HEP at least 3x a week to improve functional strength and balance for better safety at home.    Time  4    Period  Weeks    Status  Achieved    Target Date  12/17/17        PT Long Term Goals - 04/26/19 1036      PT LONG TERM GOAL #1   Title  Patient will be independent in bending down towards floor and picking up small object (<5 pounds) and then stand back up without increased pain to improve ability to pick up and clean up room at home    Baseline  09/25/18/20 able to pick up object without increase in pain, though she reports continued baseline pain, goal revised and achieved    Time  8    Period  Weeks    Status  Achieved      PT LONG TERM GOAL #2   Title  Patient will increase BLE gross strength to 4+/5 as to improve functional strength for independent gait, increased standing tolerance and increased ADL ability.    Baseline  08/31/18 R/L flexion: 5/5 ER 5/5 IR 4+/4+ ABD 5/5 Ext 4+/4    Time  8    Period  Weeks    Status  Achieved      PT LONG TERM GOAL #3   Title  Patient will report a worst pain of 5/10 with transfers of her husband to improve tolerance with ADLs     Baseline  02/01/19 7/10 pain with transfers in the LB, some increased L shoulder pain with transfers; 03/19/19 continued 7/10 pain with transfers; 04/26/19 6/10 pain over the past week with activity    Time  8    Period  Weeks    Status  On-going      PT LONG TERM GOAL #4   Title  Pt will decrease 5TSTS to 10sec order to demonstrate clinically significant improvement in LE strength, and reduced fall risk    Baseline  04/26/19 10sec 03/19/19 10.6sec 02/01/19 10.76sec  12/07/18 17sec; 04/26/19    Time  8    Period  Weeks    Status  Achieved      PT LONG TERM GOAL #5   Title  Pt will increase 10MWT by at least 0.13 m/s in order  to demonstrate clinically significant improvement in community ambulation    Baseline  02/01/19 m/s 09/25/18 0.83 07/31/18 0.33ms 07/20/18 same 06/10/18 0.710m    Time  8    Period  Weeks    Status  Achieved      Additional Long Term Goals   Additional Long Term Goals  Yes      PT LONG TERM GOAL #6   Title  Patient will demonstrate walk speed of 1.2 to demonstrate community ambulation norm    Baseline  03/26/19 1.84m9m2/26/21 1.84m/14m/11/21 0.984m/64m/16/20 0.10m/s31m1/20 0.76m/s 937m20 0.47m/s  64mme  8    Period  Weeks    Status  On-going      PT LONG TERM GOAL #7   Title  Patient will increase standing tolerance to 384min wi74mt increased pain in order to complete household chores/cooking    Baseline  03/19/19 can stand about 284mins be63m needing to sit for a break 02/01/19 about 15mins bef55mneeding a break11/16/20 Can stand only stand for 15 mins d/t decreased tolerance; 04/26/19 Can stand for 384min to co72mte ADLs with increased pain    Time  8    Period  Weeks    Status  Partially Met      PT LONG TERM GOAL #8   Title  Patient will demonstrate SLS of 10sec to demonstrate decreased fall risk    Baseline  04/26/19 RLE 7sec LLE 4sec 03/19/19 RLE 9.28 LLE 8.5 02/01/19 RLE 8.5sec  LLE 9sec 12/07/18 RLE 4sec LLE 4.2sec 11/19/18 RLE 4sec LLE 6sec    Time  8    Period  Weeks    Status  On-going      PT LONG TERM GOAL  #9   TITLE  Patient will demonstrate 15 STS in 30sec to demonstrate age-matched increase in LE endurance    Baseline  04/26/19 14    Time  8    Period  Weeks    Status  New            Plan - 04/30/19 1119    Clinical Impression Statement  Patient requires increased rest break time today d/t increased fatigue d/t sleep disturbances. With this modification patient is able to complete therex with proper  technique/form. PT will continue progression as able.    Personal Factors and Comorbidities  Age;Behavior Pattern;Fitness;Past/Current Experience;Comorbidity 1;Comorbidity 2;Comorbidity 3+    Comorbidities  scolosis, DM1, chronic pain syndrome, HTN, afib, depression    Examination-Activity Limitations  Bathing;Dressing;Transfers;Caring for Others;Carry;Squat;Lift;Bend;Bed Mobility    Examination-Participation Restrictions  Yard Work;Laundry;Cleaning;Community Activity    Stability/Clinical Decision Making  Evolving/Moderate complexity    Clinical Decision Making  Moderate    Rehab Potential  Fair    Clinical Impairments Affecting Rehab Potential  comorbidities, symptom duration, curvature degree    PT Frequency  2x / week    PT Duration  8 weeks    PT Treatment/Interventions  ADLs/Self Care Home Management;Aquatic Therapy;Cryotherapy;Ultrasound;Parrafin;Traction;Moist Heat;Electrical Stimulation;DME Instruction;Gait training;Stair training;Functional mobility training;Neuromuscular re-education;Balance training;Therapeutic exercise;Therapeutic activities;Patient/family education;Energy conservation;Spinal Manipulations;Joint Manipulations;Passive range of motion;Dry needling;Manual techniques    PT Next Visit Plan  Progress Hip and core strengthening as tolerated    PT Home Exercise Plan  Added standing pec stretch in doorway to HEP today    Consulted and Agree with Plan of Care  Patient       Patient will benefit from skilled therapeutic intervention in order to improve the following deficits and impairments:  Abnormal gait, Decreased balance, Decreased endurance,  Decreased mobility, Difficulty walking, Hypomobility, Increased muscle spasms, Decreased range of motion, Pain, Postural dysfunction, Impaired flexibility, Increased fascial restricitons, Decreased strength, Decreased activity tolerance  Visit Diagnosis: Other idiopathic scoliosis, lumbar region  Radiculopathy, lumbar  region     Problem List Patient Active Problem List   Diagnosis Date Noted  . Central sleep apnea 10/09/2016  . Hypertension 10/09/2016  . Lumbar postlaminectomy syndrome 10/09/2016  . Lumbar radiculopathy 10/09/2016  . Diabetic polyneuropathy ([place]) 10/07/2016  . Right lumbosacral radiculopathy 10/07/2016  . Healthcare maintenance 08/26/2016  . DKA (diabetic ketoacidoses) ([place]) 06/14/2016  . Atypical chest pain 02/23/2016  . SOB (shortness of breath) 02/23/2016  . Pain medication agreement signed 01/13/2015  . Acquired hypothyroidism 05/30/2014  . Frontal sinusitis 12/17/2013  . Atrial fibrillation ([place]) 11/29/2013  . Major depression in remission ([place]) 11/20/2013  . 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 Chronic pain syndrome 08/27/2013  . Back pain 07/17/2013  . HTN (hypertension), benign 07/17/2013  . Hyperlipidemia 07/17/2013  . Pancreatic insufficiency 07/17/2013  . Chronic postoperative pain 01/01/2013  . Long term current use of opiate analgesic 11/04/2012  . Hypothyroidism 08/25/2012  . Radiculopathy of leg 05/01/2011   Shelton Silvas PT, DPT Shelton Silvas 04/30/2019, 11:28 AM  [facility] [address] [place], [address] Phone: [phone]   Fax:  [phone]  Name: Colleen Payne MRN: [id number] Date of Birth: 1950/10/26

## 2019-05-03 ENCOUNTER — Ambulatory Visit: Payer: Medicare Other | Admitting: Physical Therapy

## 2019-05-07 ENCOUNTER — Ambulatory Visit: Payer: Medicare Other | Admitting: Physical Therapy

## 2019-05-10 ENCOUNTER — Ambulatory Visit: Payer: Medicare Other | Admitting: Physical Therapy

## 2019-05-14 ENCOUNTER — Ambulatory Visit: Payer: Medicare Other | Admitting: Physical Therapy

## 2019-05-17 ENCOUNTER — Ambulatory Visit: Payer: Medicare Other | Admitting: Physical Therapy

## 2019-05-21 ENCOUNTER — Ambulatory Visit: Payer: Medicare Other | Admitting: Physical Therapy

## 2019-05-28 ENCOUNTER — Ambulatory Visit: Payer: Medicare Other | Admitting: Physical Therapy

## 2019-05-31 ENCOUNTER — Ambulatory Visit: Payer: Medicare Other | Attending: Anesthesiology | Admitting: Physical Therapy

## 2019-05-31 ENCOUNTER — Other Ambulatory Visit: Payer: Self-pay

## 2019-05-31 ENCOUNTER — Encounter: Payer: Self-pay | Admitting: Physical Therapy

## 2019-05-31 DIAGNOSIS — M5416 Radiculopathy, lumbar region: Secondary | ICD-10-CM | POA: Diagnosis present

## 2019-05-31 DIAGNOSIS — M4126 Other idiopathic scoliosis, lumbar region: Secondary | ICD-10-CM | POA: Diagnosis present

## 2019-05-31 NOTE — Therapy (Signed)
[facility] [address] [place], [address] Phone: [phone]   Fax:  [phone]  Physical Therapy Treatment  Patient Details  Name: Colleen Payne MRN: [id number] Date of Birth: 1950-02-03 No data recorded  Encounter Date: 05/31/2019  PT End of Session - 05/31/19 1036    Visit Number  71    Number of Visits  92    Date for PT Re-Evaluation  06/18/19    Authorization Type  Medicare    Authorization Time Period  2/10    PT Start Time  1033    PT Stop Time  1113    PT Time Calculation (min)  40 min    Activity Tolerance  Patient tolerated treatment well;No increased pain    Behavior During Therapy  WFL for tasks assessed/performed       Past Medical History:  Diagnosis Date  . Anxiety   . Atrial fibrillation ([place])   . Depression   . Diabetes mellitus without complication (HCC)    Type I   . Headache    stress. 1x/month  . Hypertension   . Insulin pump in place   . Motion sickness    cars  . MVP (mitral valve prolapse)   . Neuromuscular disorder (HCC)    leg weakness s/p back surgeries  . Thyroid disease     Past Surgical History:  Procedure Laterality Date  . BACK SURGERY  2004   3 sugeries between Sept and Nov, fusion L2-S1  . CATARACT EXTRACTION W/PHACO Left 11/23/2014   Procedure: CATARACT EXTRACTION PHACO AND INTRAOCULAR LENS PLACEMENT (IOC);  Surgeon: Leandrew Koyanagi, MD;  Location: [facility];  Service: Ophthalmology;  Laterality: Left;  DIABETIC - insulin pump    There were no vitals filed for this visit.  Subjective Assessment - 05/31/19 1034    Subjective  Patient returns to PT following 4 week absence d/t health issues with her heart. She is continuing to be monitored by cardiology for this. Reports her husband was doing a little better this weekend. 8/10 pain today.    Pertinent History  Patient is 69 yo female that complains of lumbar pain and inability to walk  erect, as well as hip/abdominal pain. Patient with history of lumbar fusion (L2-S1). Xray results show: Fairly stable appearance of the levoscoliosis of the lower thoracic and of the lumbar spine.Stable lateral subluxation toward the left of L2 with respect L3. Stable partial compression of the body of L1. Patient complains of frustration with physicians, and that her symptoms are worsening over time. 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 Reported that her pain and difficulty worsened after trying to pick her husband up about 1 year ago. Patient mentioned that she has been told that she has "spinal fluid pockets that are leaking". PMH also includes DM I with insulin pump,  afib, HTN,  HLD, chronic pain syndrome, SOB.    Limitations  Standing;Walking;House hold activities;Other (comment)    Diagnostic tests  see exray results: Fairly stable appearance of the levoscoliosis of the lower thoracic and lumbar spine. Stable lateral subluxation toward left of L2 with respect of L3, stable partial compression of the body of L1.    Patient Stated Goals  pain relief, standing erect    Pain Onset  More than a month ago    Pain Onset  More than a month ago  Ther-Ex -Thomas Test Stretch 178mns, simultaneous bilateral want UE flex x275mutes - Open book in sidelying x12 each direction - Prone press up2x 12 with encouragement needed, less motion than previous session - Prone supermans 2x 10 (each side, 20 total) with cuing for increased hip ext with good carry over - Childs pose 2x 1 min hold with better mobility on second stretch - Hip hike with CL hip swing x20 each LE - Across the body LE swings x20 bilat -Backwardwalking1.78m178mfor4mi46mcuing forposture and "large steps" - mostly with LLE, with decent carry over. Pt able to maintain more thoracic ext with cuing to "keep eyes up"                      PT Education - 05/31/19 1036     Education Details  therex form, posture    Person(s) Educated  Patient    Methods  Explanation;Demonstration;Verbal cues    Comprehension  Verbalized understanding;Returned demonstration;Verbal cues required       PT Short Term Goals - 07/27/18 0933      PT SHORT TERM GOAL #1   Title  Patient will be adherent to initial HEP at least 3x a week to improve functional strength and balance for better safety at home.    Time  4    Period  Weeks    Status  Achieved    Target Date  12/17/17        PT Long Term Goals - 04/26/19 1036      PT LONG TERM GOAL #1   Title  Patient will be independent in bending down towards floor and picking up small object (<5 pounds) and then stand back up without increased pain to improve ability to pick up and clean up room at home    Baseline  09/25/18/20 able to pick up object without increase in pain, though she reports continued baseline pain, goal revised and achieved    Time  8    Period  Weeks    Status  Achieved      PT LONG TERM GOAL #2   Title  Patient will increase BLE gross strength to 4+/5 as to improve functional strength for independent gait, increased standing tolerance and increased ADL ability.    Baseline  08/31/18 R/L flexion: 5/5 ER 5/5 IR 4+/4+ ABD 5/5 Ext 4+/4    Time  8    Period  Weeks    Status  Achieved      PT LONG TERM GOAL #3   Title  Patient will report a worst pain of 5/10 with transfers of her husband to improve tolerance with ADLs     Baseline  02/01/19 7/10 pain with transfers in the LB, some increased L shoulder pain with transfers; 03/19/19 continued 7/10 pain with transfers; 04/26/19 6/10 pain over the past week with activity    Time  8    Period  Weeks    Status  On-going      PT LONG TERM GOAL #4   Title  Pt will decrease 5TSTS to 10sec order to demonstrate clinically significant improvement in LE strength, and reduced fall risk    Baseline  04/26/19 10sec 03/19/19 10.6sec 02/01/19 10.76sec 12/07/18 17sec; 04/26/19     Time  8    Period  Weeks    Status  Achieved      PT LONG TERM GOAL #5   Title  Pt will increase 10MWT by at least 0.13 m/s in order  to demonstrate clinically significant improvement in community ambulation    Baseline  02/01/19 m/s 09/25/18 0.83 07/31/18 0.2ms 07/20/18 same 06/10/18 0.7261m    Time  8    Period  Weeks    Status  Achieved      Additional Long Term Goals   Additional Long Term Goals  Yes      PT LONG TERM GOAL #6   Title  Patient will demonstrate walk speed of 1.2 to demonstrate community ambulation norm    Baseline  03/26/19 1.661m64m2/26/21 1.661m/32m/11/21 0.9661m/12m/16/20 0.17m/s4661m1/20 0.61m/s 9261m20 0.36m/s  81mme  8    Period  Weeks    Status  On-going      PT LONG TERM GOAL #7   Title  Patient will increase standing tolerance to 3661min wi26mt increased pain in order to complete household chores/cooking    Baseline  03/19/19 can stand about 2661mins be33m needing to sit for a break 02/01/19 about 15mins bef64mneeding a break11/16/20 Can stand only stand for 15 mins d/t decreased tolerance; 04/26/19 Can stand for 3661min to co73mte ADLs with increased pain    Time  8    Period  Weeks    Status  Partially Met      PT LONG TERM GOAL #8   Title  Patient will demonstrate SLS of 10sec to demonstrate decreased fall risk    Baseline  04/26/19 RLE 7sec LLE 4sec 03/19/19 RLE 9.28 LLE 8.5 02/01/19 RLE 8.5sec  LLE 9sec 12/07/18 RLE 4sec LLE 4.2sec 11/19/18 RLE 4sec LLE 6sec    Time  8    Period  Weeks    Status  On-going      PT LONG TERM GOAL  #9   TITLE  Patient will demonstrate 15 STS in 30sec to demonstrate age-matched increase in LE endurance    Baseline  04/26/19 14    Time  8    Period  Weeks    Status  New            Plan - 05/31/19 1045    Clinical Impression Statement  PT session with mobility focus d/t patient reporting feeling very stiff following weeks without therapy (d/t health reasons). Patient is able to comply with all cuing for proper technique of therex  and is motivatedf throughout session. Patient reports fatigue with therex with minimal increased pain. PT will continue progression as able.    Personal Factors and Comorbidities  Age;Behavior Pattern;Fitness;Past/Current Experience;Comorbidity 1;Comorbidity 2;Comorbidity 3+    Comorbidities  scolosis, DM1, chronic pain syndrome, HTN, afib, depression    Examination-Activity Limitations  Bathing;Dressing;Transfers;Caring for Others;Carry;Squat;Lift;Bend;Bed Mobility    Examination-Participation Restrictions  Yard Work;Laundry;Cleaning;Community Activity    Stability/Clinical Decision Making  Evolving/Moderate complexity    Rehab Potential  Fair    Clinical Impairments Affecting Rehab Potential  comorbidities, symptom duration, curvature degree    PT Frequency  2x / week    PT Duration  8 weeks    PT Treatment/Interventions  ADLs/Self Care Home Management;Aquatic Therapy;Cryotherapy;Ultrasound;Parrafin;Traction;Moist Heat;Electrical Stimulation;DME Instruction;Gait training;Stair training;Functional mobility training;Neuromuscular re-education;Balance training;Therapeutic exercise;Therapeutic activities;Patient/family education;Energy conservation;Spinal Manipulations;Joint Manipulations;Passive range of motion;Dry needling;Manual techniques    PT Next Visit Plan  Progress Hip and core strengthening as tolerated    PT Home Exercise Plan  Added standing pec stretch in doorway to HEP today    Consulted and Agree with Plan of Care  Patient       Patient will benefit from skilled therapeutic intervention in order to improve  the following deficits and impairments:  Abnormal gait, Decreased balance, Decreased endurance, Decreased mobility, Difficulty walking, Hypomobility, Increased muscle spasms, Decreased range of motion, Pain, Postural dysfunction, Impaired flexibility, Increased fascial restricitons, Decreased strength, Decreased activity tolerance  Visit Diagnosis: Other idiopathic scoliosis,  lumbar region  Radiculopathy, lumbar region     Problem List Patient Active Problem List   Diagnosis Date Noted  . Central sleep apnea 10/09/2016  . Hypertension 10/09/2016  . Lumbar postlaminectomy syndrome 10/09/2016  . Lumbar radiculopathy 10/09/2016  . Diabetic polyneuropathy ([place]) 10/07/2016  . Right lumbosacral radiculopathy 10/07/2016  . Healthcare maintenance 08/26/2016  . DKA (diabetic ketoacidoses) ([place]) 06/14/2016  . Atypical chest pain 02/23/2016  . SOB (shortness of breath) 02/23/2016  . Pain medication agreement signed 01/13/2015  . Acquired hypothyroidism 05/30/2014  . Frontal sinusitis 12/17/2013  . Atrial fibrillation ([place]) 11/29/2013  . Major depression in remission ([place]) 11/20/2013  . 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 Chronic pain syndrome 08/27/2013  . Back pain 07/17/2013  . HTN (hypertension), benign 07/17/2013  . Hyperlipidemia 07/17/2013  . Pancreatic insufficiency 07/17/2013  . Chronic postoperative pain 01/01/2013  . Long term current use of opiate analgesic 11/04/2012  . Hypothyroidism 08/25/2012  . Radiculopathy of leg 05/01/2011   Shelton Silvas PT, DPT Shelton Silvas 05/31/2019, 11:15 AM  [facility] [address], [place], [address] Phone: [phone]   Fax:  [phone]  Name: Colleen Payne MRN: [id number] Date of Birth: December 27, 1950

## 2019-06-04 ENCOUNTER — Encounter: Payer: Self-pay | Admitting: Physical Therapy

## 2019-06-04 ENCOUNTER — Other Ambulatory Visit: Payer: Self-pay

## 2019-06-04 ENCOUNTER — Ambulatory Visit: Payer: Medicare Other | Admitting: Physical Therapy

## 2019-06-04 DIAGNOSIS — M4126 Other idiopathic scoliosis, lumbar region: Secondary | ICD-10-CM | POA: Diagnosis not present

## 2019-06-04 DIAGNOSIS — M5416 Radiculopathy, lumbar region: Secondary | ICD-10-CM

## 2019-06-04 NOTE — Therapy (Signed)
[facility] [address], [place], [address] Phone: [phone]   Fax:  [phone]  Physical Therapy Treatment  Patient Details  Name: Colleen Payne MRN: [id number] Date of Birth: 12/07/1950 No data recorded  Encounter Date: 06/04/2019  PT End of Session - 06/04/19 1059    Visit Number  72    Number of Visits  92    Date for PT Re-Evaluation  06/18/19    Authorization Type  Medicare    Authorization Time Period  3/10    PT Start Time  2229    PT Stop Time  1125    PT Time Calculation (min)  38 min    Activity Tolerance  Patient tolerated treatment well;No increased pain    Behavior During Therapy  WFL for tasks assessed/performed       Past Medical History:  Diagnosis Date  . Anxiety   . Atrial fibrillation ([place])   . Depression   . Diabetes mellitus without complication (HCC)    Type I   . Headache    stress. 1x/month  . Hypertension   . Insulin pump in place   . Motion sickness    cars  . MVP (mitral valve prolapse)   . Neuromuscular disorder (HCC)    leg weakness s/p back surgeries  . Thyroid disease     Past Surgical History:  Procedure Laterality Date  . BACK SURGERY  2004   3 sugeries between Sept and Nov, fusion L2-S1  . CATARACT EXTRACTION W/PHACO Left 11/23/2014   Procedure: CATARACT EXTRACTION PHACO AND INTRAOCULAR LENS PLACEMENT (IOC);  Surgeon: Leandrew Koyanagi, MD;  Location: [facility];  Service: Ophthalmology;  Laterality: Left;  DIABETIC - insulin pump    There were no vitals filed for this visit.  Subjective Assessment - 06/04/19 1054    Subjective  Patient reports she is doing well overall, but still feels very stiff today, and is having 9/10 pain. Reports she is having to change her husbands wound dressing every day and that this is stressful. Patient reports she cannot complete prone lying this session d/t not having clip for pancreas pump and that this  "digging into her".    Pertinent History  Patient is 68 yo female that complains of lumbar pain and inability to walk erect, as well as hip/abdominal pain. Patient with history of lumbar fusion (L2-S1). Xray results show: Fairly stable appearance of the levoscoliosis of the lower thoracic and of the lumbar spine.Stable lateral subluxation toward the left of L2 with respect L3. Stable partial compression of the body of L1. Patient complains of frustration with physicians, and that her symptoms are worsening over time. 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 Reported that her pain and difficulty worsened after trying to pick her husband up about 1 year ago. Patient mentioned that she has been told that she has "spinal fluid pockets that are leaking". PMH also includes DM I with insulin pump,  afib, HTN,  HLD, chronic pain syndrome, SOB.    Limitations  Standing;Walking;House hold activities;Other (comment)    Diagnostic tests  see exray results: Fairly stable appearance of the levoscoliosis of the lower thoracic and lumbar spine. Stable lateral subluxation toward left of L2 with respect of L3, stable partial compression of the body of L1.    Patient Stated Goals  pain relief, standing erect    Pain Onset  More than a month  ago    Pain Onset  More than a month ago         Ther-Ex -Thomas Test Stretch 32mns, simultaneous bilateral want UE flex x272mutes - Open book in sidelying x15 each direction - Cat/Cow x12 with cuing for breath control with good carry over following - Childs pose 2x 1 min hold with better mobility on second stretch; between birddog sets - Alt birdog 2x 10 with minA for maintaining stability occasionally  - Seated thoracic ext with bilat UE flex with PVC over 1/2 foam x12 with cuing for breath control - Standing trunk rotations with PVC across CTJ with UE hold x12 with cuing to prevent breath holding, good carry over - Hip hike with CL  hip swing x15 each LE - Across the body LE swings x15 bilat -Backwardwalking1.68m53mfor4mi22m 0.8mph33min,2mng forposture and "large steps" - mostly with LLE, with decent carry over. Pt able to maintain more thoracic ext with cuing to "keep eyes up"                        PT Education - 06/04/19 1056    Education Details  therex form/posture    Person(s) Educated  Patient    Methods  Explanation;Demonstration;Tactile cues;Verbal cues    Comprehension  Verbalized understanding;Returned demonstration;Verbal cues required;Tactile cues required       PT Short Term Goals - 07/27/18 0933      PT SHORT TERM GOAL #1   Title  Patient will be adherent to initial HEP at least 3x a week to improve functional strength and balance for better safety at home.    Time  4    Period  Weeks    Status  Achieved    Target Date  12/17/17        PT Long Term Goals - 04/26/19 1036      PT LONG TERM GOAL #1   Title  Patient will be independent in bending down towards floor and picking up small object (<5 pounds) and then stand back up without increased pain to improve ability to pick up and clean up room at home    Baseline  09/25/18/20 able to pick up object without increase in pain, though she reports continued baseline pain, goal revised and achieved    Time  8    Period  Weeks    Status  Achieved      PT LONG TERM GOAL #2   Title  Patient will increase BLE gross strength to 4+/5 as to improve functional strength for independent gait, increased standing tolerance and increased ADL ability.    Baseline  08/31/18 R/L flexion: 5/5 ER 5/5 IR 4+/4+ ABD 5/5 Ext 4+/4    Time  8    Period  Weeks    Status  Achieved      PT LONG TERM GOAL #3   Title  Patient will report a worst pain of 5/10 with transfers of her husband to improve tolerance with ADLs     Baseline  02/01/19 7/10 pain with transfers in the LB, some increased L shoulder pain with transfers; 03/19/19 continued 7/10 pain  with transfers; 04/26/19 6/10 pain over the past week with activity    Time  8    Period  Weeks    Status  On-going      PT LONG TERM GOAL #4   Title  Pt will decrease 5TSTS to 10sec order to demonstrate clinically significant improvement in LE  strength, and reduced fall risk    Baseline  04/26/19 10sec 03/19/19 10.6sec 02/01/19 10.76sec 12/07/18 17sec; 04/26/19    Time  8    Period  Weeks    Status  Achieved      PT LONG TERM GOAL #5   Title  Pt will increase 10MWT by at least 0.13 m/s in order to demonstrate clinically significant improvement in community ambulation    Baseline  02/01/19 m/s 09/25/18 0.83 07/31/18 0.16ms 07/20/18 same 06/10/18 0.749m    Time  8    Period  Weeks    Status  Achieved      Additional Long Term Goals   Additional Long Term Goals  Yes      PT LONG TERM GOAL #6   Title  Patient will demonstrate walk speed of 1.2 to demonstrate community ambulation norm    Baseline  03/26/19 1.55m646m2/26/21 1.55m/79m/11/21 0.955m/555m/16/20 0.42m/s1m1/20 0.46m/s 9102m20 0.66m/s  1055mme  8    Period  Weeks    Status  On-going      PT LONG TERM GOAL #7   Title  Patient will increase standing tolerance to 355min wi12mt increased pain in order to complete household chores/cooking    Baseline  03/19/19 can stand about 255mins be56m needing to sit for a break 02/01/19 about 15mins bef255mneeding a break11/16/20 Can stand only stand for 15 mins d/t decreased tolerance; 04/26/19 Can stand for 355min to co15mte ADLs with increased pain    Time  8    Period  Weeks    Status  Partially Met      PT LONG TERM GOAL #8   Title  Patient will demonstrate SLS of 10sec to demonstrate decreased fall risk    Baseline  04/26/19 RLE 7sec LLE 4sec 03/19/19 RLE 9.28 LLE 8.5 02/01/19 RLE 8.5sec  LLE 9sec 12/07/18 RLE 4sec LLE 4.2sec 11/19/18 RLE 4sec LLE 6sec    Time  8    Period  Weeks    Status  On-going      PT LONG TERM GOAL  #9   TITLE  Patient will demonstrate 15 STS in 30sec to demonstrate age-matched  increase in LE endurance    Baseline  04/26/19 14    Time  8    Period  Weeks    Status  New            Plan - 06/04/19 1111    Clinical Impression Statement  PT continued mobility focus within patient pain tolerance with good success. Patient is able to comply with cuing for proper technique of therex, and is motivated throughout session despite increased pain. Reports 7/10 pain following, down from 10/10 when she arrives. PT will continue progression as able    Personal Factors and Comorbidities  Age;Behavior Pattern;Fitness;Past/Current Experience;Comorbidity 1;Comorbidity 2;Comorbidity 3+    Comorbidities  scolosis, DM1, chronic pain syndrome, HTN, afib, depression    Examination-Activity Limitations  Bathing;Dressing;Transfers;Caring for Others;Carry;Squat;Lift;Bend;Bed Mobility    Examination-Participation Restrictions  Yard Work;Laundry;Cleaning;Community Activity    Stability/Clinical Decision Making  Evolving/Moderate complexity    Clinical Decision Making  Moderate    Rehab Potential  Fair    Clinical Impairments Affecting Rehab Potential  comorbidities, symptom duration, curvature degree    PT Frequency  2x / week    PT Duration  8 weeks    PT Treatment/Interventions  ADLs/Self Care Home Management;Aquatic Therapy;Cryotherapy;Ultrasound;Parrafin;Traction;Moist Heat;Electrical Stimulation;DME Instruction;Gait training;Stair training;Functional mobility training;Neuromuscular re-education;Balance training;Therapeutic exercise;Therapeutic activities;Patient/family education;Energy conservation;Spinal Manipulations;Joint Manipulations;Passive  range of motion;Dry needling;Manual techniques    PT Next Visit Plan  Progress Hip and core strengthening as tolerated    PT Home Exercise Plan  Added standing pec stretch in doorway to HEP today    Consulted and Agree with Plan of Care  Patient       Patient will benefit from skilled therapeutic intervention in order to improve the  following deficits and impairments:  Abnormal gait, Decreased balance, Decreased endurance, Decreased mobility, Difficulty walking, Hypomobility, Increased muscle spasms, Decreased range of motion, Pain, Postural dysfunction, Impaired flexibility, Increased fascial restricitons, Decreased strength, Decreased activity tolerance  Visit Diagnosis: Other idiopathic scoliosis, lumbar region  Radiculopathy, lumbar region     Problem List Patient Active Problem List   Diagnosis Date Noted  . Central sleep apnea 10/09/2016  . Hypertension 10/09/2016  . Lumbar postlaminectomy syndrome 10/09/2016  . Lumbar radiculopathy 10/09/2016  . Diabetic polyneuropathy ([place]) 10/07/2016  . Right lumbosacral radiculopathy 10/07/2016  . Healthcare maintenance 08/26/2016  . DKA (diabetic ketoacidoses) ([place]) 06/14/2016  . Atypical chest pain 02/23/2016  . SOB (shortness of breath) 02/23/2016  . Pain medication agreement signed 01/13/2015  . Acquired hypothyroidism 05/30/2014  . Frontal sinusitis 12/17/2013  . Atrial fibrillation ([place]) 11/29/2013  . Major depression in remission ([place]) 11/20/2013  . 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 Chronic pain syndrome 08/27/2013  . Back pain 07/17/2013  . HTN (hypertension), benign 07/17/2013  . Hyperlipidemia 07/17/2013  . Pancreatic insufficiency 07/17/2013  . Chronic postoperative pain 01/01/2013  . Long term current use of opiate analgesic 11/04/2012  . Hypothyroidism 08/25/2012  . Radiculopathy of leg 05/01/2011   Shelton Silvas PT, DPT Shelton Silvas 06/04/2019, 11:20 AM  [facility] [address], [place], [address] Phone: [phone]   Fax:  [phone]  Name: MATTALYN ANDEREGG MRN: [id number] Date of Birth: 02/11/1950

## 2019-06-07 ENCOUNTER — Encounter: Payer: Self-pay | Admitting: Physical Therapy

## 2019-06-07 ENCOUNTER — Ambulatory Visit: Payer: Medicare Other | Admitting: Physical Therapy

## 2019-06-07 ENCOUNTER — Other Ambulatory Visit: Payer: Self-pay

## 2019-06-07 DIAGNOSIS — M4126 Other idiopathic scoliosis, lumbar region: Secondary | ICD-10-CM | POA: Diagnosis not present

## 2019-06-07 DIAGNOSIS — M5416 Radiculopathy, lumbar region: Secondary | ICD-10-CM

## 2019-06-07 NOTE — Therapy (Signed)
[facility] [address] [place], [address] Phone: [phone]   Fax:  [phone]  Physical Therapy Treatment  Patient Details  Name: Colleen Payne MRN: [id number] Date of Birth: 03-14-1950 No data recorded  Encounter Date: 06/07/2019  PT End of Session - 06/07/19 1053    Visit Number  23    Number of Visits  87    Date for PT Re-Evaluation  06/18/19    Authorization Type  Medicare    Authorization Time Period  4/10    PT Start Time  1045    PT Stop Time  1115    PT Time Calculation (min)  30 min    Activity Tolerance  Patient tolerated treatment well;No increased pain    Behavior During Therapy  WFL for tasks assessed/performed       Past Medical History:  Diagnosis Date  . Anxiety   . Atrial fibrillation ([facility])   . Depression   . Diabetes mellitus without complication (HCC)    Type I   . Headache    stress. 1x/month  . Hypertension   . Insulin pump in place   . Motion sickness    cars  . MVP (mitral valve prolapse)   . Neuromuscular disorder (HCC)    leg weakness s/p back surgeries  . Thyroid disease     Past Surgical History:  Procedure Laterality Date  . BACK SURGERY  2004   3 sugeries between Sept and Nov, fusion L2-S1  . CATARACT EXTRACTION W/PHACO Left 11/23/2014   Procedure: CATARACT EXTRACTION PHACO AND INTRAOCULAR LENS PLACEMENT (IOC);  Surgeon: Leandrew Koyanagi, MD;  Location: [facility];  Service: Ophthalmology;  Laterality: Left;  DIABETIC - insulin pump    There were no vitals filed for this visit.  Subjective Assessment - 06/07/19 1049    Subjective  Reports she felt looser after friday's session, did "okay" over the weekend. 8/10 pain today.    Pertinent History  Patient is 69 yo female that complains of lumbar pain and inability to walk erect, as well as hip/abdominal pain. Patient with history of lumbar fusion (L2-S1). Xray results show: Fairly stable  appearance of the levoscoliosis of the lower thoracic and of the lumbar spine.Stable lateral subluxation toward the left of L2 with respect L3. Stable partial compression of the body of L1. Patient complains of frustration with physicians, and that her symptoms are worsening over time. 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 Reported that her pain and difficulty worsened after trying to pick her husband up about 1 year ago. Patient mentioned that she has been told that she has "spinal fluid pockets that are leaking". PMH also includes DM I with insulin pump,  afib, HTN,  HLD, chronic pain syndrome, SOB.    Limitations  Standing;Walking;House hold activities;Other (comment)    Diagnostic tests  see exray results: Fairly stable appearance of the levoscoliosis of the lower thoracic and lumbar spine. Stable lateral subluxation toward left of L2 with respect of L3, stable partial compression of the body of L1.    Patient Stated Goals  pain relief, standing erect    Pain Onset  More than a month ago    Pain Onset  More than a month ago        Ther-Ex -Motorola, simultaneous bilateral 1x41mnutes -concurrent with above, BUE wand flexionover x10 - Prone press up2x 12 with encouragement needed, unable  to achieve full lift, rest in prone prop 2x 78mn  - Prone alt supermans 3x 10 with cuing for full hip ext with good carry over - Cat/Cow x12 with cuing for breath control/tecnique with good carry over CArdine Engpose 122m - Hip hike with CL hip swing x15 each LE -Backwardwalking1.26m326mfor5mi108mcuing forposture and "large steps" with decent carry over. Pt able to maintain more thoracic ext with cuing to "keep eyes up"                          PT Education - 06/07/19 1052    Education Details  therex form, posture,    Person(s) Educated  Patient    Methods  Explanation;Demonstration;Verbal cues    Comprehension  Verbalized  understanding;Returned demonstration;Verbal cues required       PT Short Term Goals - 07/27/18 0933      PT SHORT TERM GOAL #1   Title  Patient will be adherent to initial HEP at least 3x a week to improve functional strength and balance for better safety at home.    Time  4    Period  Weeks    Status  Achieved    Target Date  12/17/17        PT Long Term Goals - 04/26/19 1036      PT LONG TERM GOAL #1   Title  Patient will be independent in bending down towards floor and picking up small object (<5 pounds) and then stand back up without increased pain to improve ability to pick up and clean up room at home    Baseline  09/25/18/20 able to pick up object without increase in pain, though she reports continued baseline pain, goal revised and achieved    Time  8    Period  Weeks    Status  Achieved      PT LONG TERM GOAL #2   Title  Patient will increase BLE gross strength to 4+/5 as to improve functional strength for independent gait, increased standing tolerance and increased ADL ability.    Baseline  08/31/18 R/L flexion: 5/5 ER 5/5 IR 4+/4+ ABD 5/5 Ext 4+/4    Time  8    Period  Weeks    Status  Achieved      PT LONG TERM GOAL #3   Title  Patient will report a worst pain of 5/10 with transfers of her husband to improve tolerance with ADLs     Baseline  02/01/19 7/10 pain with transfers in the LB, some increased L shoulder pain with transfers; 03/19/19 continued 7/10 pain with transfers; 04/26/19 6/10 pain over the past week with activity    Time  8    Period  Weeks    Status  On-going      PT LONG TERM GOAL #4   Title  Pt will decrease 5TSTS to 10sec order to demonstrate clinically significant improvement in LE strength, and reduced fall risk    Baseline  04/26/19 10sec 03/19/19 10.6sec 02/01/19 10.76sec 12/07/18 17sec; 04/26/19    Time  8    Period  Weeks    Status  Achieved      PT LONG TERM GOAL #5   Title  Pt will increase 10MWT by at least 0.13 m/s in order to demonstrate  clinically significant improvement in community ambulation    Baseline  02/01/19 m/s 09/25/18 0.83 07/31/18 0.34m/32m29/20 same 06/10/18 0.17m/s24mTime  8  Period  Weeks    Status  Achieved      Additional Long Term Goals   Additional Long Term Goals  Yes      PT LONG TERM GOAL #6   Title  Patient will demonstrate walk speed of 1.2 to demonstrate community ambulation norm    Baseline  03/26/19 1.24ms 03/19/19 1.044m 02/01/19 0.90691m11/16/20 0.43m23m/21/20 0.91m/s72m4/20 0.75m/s20mTime  8    Period  Weeks    Status  On-going      PT LONG TERM GOAL #7   Title  Patient will increase standing tolerance to 30min 1mout increased pain in order to complete household chores/cooking    Baseline  03/19/19 can stand about 20mins 44mre needing to sit for a break 02/01/19 about 15mins b50me needing a break11/16/20 Can stand only stand for 15 mins d/t decreased tolerance; 04/26/19 Can stand for 30min to 51mlete ADLs with increased pain    Time  8    Period  Weeks    Status  Partially Met      PT LONG TERM GOAL #8   Title  Patient will demonstrate SLS of 10sec to demonstrate decreased fall risk    Baseline  04/26/19 RLE 7sec LLE 4sec 03/19/19 RLE 9.28 LLE 8.5 02/01/19 RLE 8.5sec  LLE 9sec 12/07/18 RLE 4sec LLE 4.2sec 11/19/18 RLE 4sec LLE 6sec    Time  8    Period  Weeks    Status  On-going      PT LONG TERM GOAL  #9   TITLE  Patient will demonstrate 15 STS in 30sec to demonstrate age-matched increase in LE endurance    Baseline  04/26/19 14    Time  8    Period  Weeks    Status  New            Plan - 06/07/19 1055    Clinical Impression Statement  PT continued therex progression for mobility and increased muscle activation with success. Patient able to tolerate increased therex progression for increased resistance this session. Patient with fatigue, but good motivation throughout session. PT will continue progression as able.    Personal Factors and Comorbidities  Age;Behavior  Pattern;Fitness;Past/Current Experience;Comorbidity 1;Comorbidity 2;Comorbidity 3+    Comorbidities  scolosis, DM1, chronic pain syndrome, HTN, afib, depression    Examination-Activity Limitations  Bathing;Dressing;Transfers;Caring for Others;Carry;Squat;Lift;Bend;Bed Mobility    Examination-Participation Restrictions  Yard Work;Laundry;Cleaning;Community Activity    Stability/Clinical Decision Making  Evolving/Moderate complexity    Clinical Decision Making  Moderate    Rehab Potential  Fair    Clinical Impairments Affecting Rehab Potential  comorbidities, symptom duration, curvature degree    PT Frequency  2x / week    PT Duration  8 weeks    PT Treatment/Interventions  ADLs/Self Care Home Management;Aquatic Therapy;Cryotherapy;Ultrasound;Parrafin;Traction;Moist Heat;Electrical Stimulation;DME Instruction;Gait training;Stair training;Functional mobility training;Neuromuscular re-education;Balance training;Therapeutic exercise;Therapeutic activities;Patient/family education;Energy conservation;Spinal Manipulations;Joint Manipulations;Passive range of motion;Dry needling;Manual techniques    PT Next Visit Plan  Progress Hip and core strengthening as tolerated    PT Home Exercise Plan  Added standing pec stretch in doorway to HEP today    Consulted and Agree with Plan of Care  Patient       Patient will benefit from skilled therapeutic intervention in order to improve the following deficits and impairments:  Abnormal gait, Decreased balance, Decreased endurance, Decreased mobility, Difficulty walking, Hypomobility, Increased muscle spasms, Decreased range of motion, Pain, Postural dysfunction, Impaired flexibility, Increased fascial restricitons, Decreased strength, Decreased activity tolerance  Visit Diagnosis: Other idiopathic scoliosis, lumbar region  Radiculopathy, lumbar region     Problem List Patient Active Problem List   Diagnosis Date Noted  . Central sleep apnea 10/09/2016  .  Hypertension 10/09/2016  . Lumbar postlaminectomy syndrome 10/09/2016  . Lumbar radiculopathy 10/09/2016  . Diabetic polyneuropathy ([place]) 10/07/2016  . Right lumbosacral radiculopathy 10/07/2016  . Healthcare maintenance 08/26/2016  . DKA (diabetic ketoacidoses) ([place]) 06/14/2016  . Atypical chest pain 02/23/2016  . SOB (shortness of breath) 02/23/2016  . Pain medication agreement signed 01/13/2015  . Acquired hypothyroidism 05/30/2014  . Frontal sinusitis 12/17/2013  . Atrial fibrillation ([place]) 11/29/2013  . Major depression in remission ([place]) 11/20/2013  . 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 Chronic pain syndrome 08/27/2013  . Back pain 07/17/2013  . HTN (hypertension), benign 07/17/2013  . Hyperlipidemia 07/17/2013  . Pancreatic insufficiency 07/17/2013  . Chronic postoperative pain 01/01/2013  . Long term current use of opiate analgesic 11/04/2012  . Hypothyroidism 08/25/2012  . Radiculopathy of leg 05/01/2011    Shelton Silvas 06/07/2019, 11:16 AM  [facility] [address] [place], [address] Phone: [phone]   Fax:  [phone]  Name: JEANNELLE WIENS MRN: [id number] Date of Birth: [birth year]

## 2019-06-11 ENCOUNTER — Other Ambulatory Visit: Payer: Self-pay

## 2019-06-11 ENCOUNTER — Ambulatory Visit: Payer: Medicare Other | Admitting: Physical Therapy

## 2019-06-11 ENCOUNTER — Encounter: Payer: Self-pay | Admitting: Physical Therapy

## 2019-06-11 DIAGNOSIS — M5416 Radiculopathy, lumbar region: Secondary | ICD-10-CM

## 2019-06-11 DIAGNOSIS — M4126 Other idiopathic scoliosis, lumbar region: Secondary | ICD-10-CM

## 2019-06-11 NOTE — Therapy (Signed)
[facility] [address] [place], [address] Phone: [phone]   Fax:  [phone]  Physical Therapy Treatment  Patient Details  Name: Colleen Payne MRN: [id number] Date of Birth: [birth year] No data recorded  Encounter Date: 06/11/2019  PT End of Session - 06/11/19 1055    Visit Number  33    Number of Visits  92    Date for PT Re-Evaluation  06/18/19    Authorization Type  Medicare    Authorization Time Period  5/10    PT Start Time  1043    PT Stop Time  1122    PT Time Calculation (min)  39 min    Activity Tolerance  Patient tolerated treatment well;No increased pain    Behavior During Therapy  WFL for tasks assessed/performed       Past Medical History:  Diagnosis Date  . Anxiety   . Atrial fibrillation ([place])   . Depression   . Diabetes mellitus without complication (HCC)    Type I   . Headache    stress. 1x/month  . Hypertension   . Insulin pump in place   . Motion sickness    cars  . MVP (mitral valve prolapse)   . Neuromuscular disorder (H[facility])    leg weakness s/p back surgeries  . Thyroid disease     Past Surgical History:  Procedure Laterality Date  . BACK SURGERY  2004   3 sugeries between Sept and Nov, fusion L2-S1  . CATARACT EXTRACTION W/PHACO Left 11/23/2014   Procedure: CATARACT EXTRACTION PHACO AND INTRAOCULAR LENS PLACEMENT (IOC);  Surgeon: Leandrew Koyanagi, MD;  Location: [facility];  Service: Ophthalmology;  Laterality: Left;  DIABETIC - insulin pump    There were no vitals filed for this visit.  Subjective Assessment - 06/11/19 1045    Subjective  Patient reports she is continuing to feel better, that she she feels better today than Monday, pain is 7/10 today.    Pertinent History  Patient is 69 yo female that complains of lumbar pain and inability to walk erect, as well as hip/abdominal pain. Patient with history of lumbar fusion (L2-S1). Xray results  show: Fairly stable appearance of the levoscoliosis of the lower thoracic and of the lumbar spine.Stable lateral subluxation toward the left of L2 with respect L3. Stable partial compression of the body of L1. Patient complains of frustration with physicians, and that her symptoms are worsening over time. 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 Reported that her pain and difficulty worsened after trying to pick her husband up about 1 year ago. Patient mentioned that she has been told that she has "spinal fluid pockets that are leaking". PMH also includes DM I with insulin pump,  afib, HTN,  HLD, chronic pain syndrome, SOB.    Limitations  Standing;Walking;House hold activities;Other (comment)    Diagnostic tests  see exray results: Fairly stable appearance of the levoscoliosis of the lower thoracic and lumbar spine. Stable lateral subluxation toward left of L2 with respect of L3, stable partial compression of the body of L1.    Patient Stated Goals  pain relief, standing erect    Pain Onset  More than a month ago    Pain Onset  More than a month ago         Ther-Ex -Motorola, simultaneous bilateral 1x53mnutes -concurrent with above, BUE wand flexionover x10 - Prone press  up2x 12 with encouragement needed, unable to achieve full lift, rest in prone prop 2x 49mn  - Cat/Cow x12 with cuing for breath control/tecnique with good carry over - Birddog 3x 10 with childs pose 1423m between minA to prevent LOB - Lumbar ext from mat table with arms across chest, PT providing post block modA, and overpressure to full ext - Hip hike with CL hip swing x15 each LE; horizontal swing x15 each LE -Backwardwalking1.23m45mfor5mi27mcuing forposture and "large steps" with decent carry over. Pt able to maintain more thoracic ext with cuing to "keep eyes up"                        PT Education - 06/11/19 1047    Education Details   therex form, posture    Person(s) Educated  Patient    Methods  Explanation;Demonstration;Verbal cues    Comprehension  Verbalized understanding;Returned demonstration;Verbal cues required       PT Short Term Goals - 07/27/18 0933      PT SHORT TERM GOAL #1   Title  Patient will be adherent to initial HEP at least 3x a week to improve functional strength and balance for better safety at home.    Time  4    Period  Weeks    Status  Achieved    Target Date  12/17/17        PT Long Term Goals - 04/26/19 1036      PT LONG TERM GOAL #1   Title  Patient will be independent in bending down towards floor and picking up small object (<5 pounds) and then stand back up without increased pain to improve ability to pick up and clean up room at home    Baseline  09/25/18/20 able to pick up object without increase in pain, though she reports continued baseline pain, goal revised and achieved    Time  8    Period  Weeks    Status  Achieved      PT LONG TERM GOAL #2   Title  Patient will increase BLE gross strength to 4+/5 as to improve functional strength for independent gait, increased standing tolerance and increased ADL ability.    Baseline  08/31/18 R/L flexion: 5/5 ER 5/5 IR 4+/4+ ABD 5/5 Ext 4+/4    Time  8    Period  Weeks    Status  Achieved      PT LONG TERM GOAL #3   Title  Patient will report a worst pain of 5/10 with transfers of her husband to improve tolerance with ADLs     Baseline  02/01/19 7/10 pain with transfers in the LB, some increased L shoulder pain with transfers; 03/19/19 continued 7/10 pain with transfers; 04/26/19 6/10 pain over the past week with activity    Time  8    Period  Weeks    Status  On-going      PT LONG TERM GOAL #4   Title  Pt will decrease 5TSTS to 10sec order to demonstrate clinically significant improvement in LE strength, and reduced fall risk    Baseline  04/26/19 10sec 03/19/19 10.6sec 02/01/19 10.76sec 12/07/18 17sec; 04/26/19    Time  8    Period   Weeks    Status  Achieved      PT LONG TERM GOAL #5   Title  Pt will increase 10MWT by at least 0.13 m/s in order to demonstrate clinically significant improvement in community  ambulation    Baseline  02/01/19 m/s 09/25/18 0.83 07/31/18 0.49ms 07/20/18 same 06/10/18 0.729m    Time  8    Period  Weeks    Status  Achieved      Additional Long Term Goals   Additional Long Term Goals  Yes      PT LONG TERM GOAL #6   Title  Patient will demonstrate walk speed of 1.2 to demonstrate community ambulation norm    Baseline  03/26/19 1.4m42m2/26/21 1.4m/13m/11/21 0.94m/33m/16/20 0.91m/s7m1/20 0.59m/s 9659m20 0.4m/s  89mme  8    Period  Weeks    Status  On-going      PT LONG TERM GOAL #7   Title  Patient will increase standing tolerance to 34min wi64mt increased pain in order to complete household chores/cooking    Baseline  03/19/19 can stand about 24mins be459m needing to sit for a break 02/01/19 about 15mins bef27mneeding a break11/16/20 Can stand only stand for 15 mins d/t decreased tolerance; 04/26/19 Can stand for 34min to co35mte ADLs with increased pain    Time  8    Period  Weeks    Status  Partially Met      PT LONG TERM GOAL #8   Title  Patient will demonstrate SLS of 10sec to demonstrate decreased fall risk    Baseline  04/26/19 RLE 7sec LLE 4sec 03/19/19 RLE 9.28 LLE 8.5 02/01/19 RLE 8.5sec  LLE 9sec 12/07/18 RLE 4sec LLE 4.2sec 11/19/18 RLE 4sec LLE 6sec    Time  8    Period  Weeks    Status  On-going      PT LONG TERM GOAL  #9   TITLE  Patient will demonstrate 15 STS in 30sec to demonstrate age-matched increase in LE endurance    Baseline  04/26/19 14    Time  8    Period  Weeks    Status  New            Plan - 06/11/19 1113    Clinical Impression Statement  PT continued therex progression for increased extension and posture with good success. Patient is able to tolerate increased WB therex today as patient is feeling better, with decreased pain response today. Patient is  able to comply with cuing for therex and safety with good carry over. Patient motivated throughout session, with fatigue following, but no increased pain. PT will continue progression as able.    Personal Factors and Comorbidities  Age;Behavior Pattern;Fitness;Past/Current Experience;Comorbidity 1;Comorbidity 2;Comorbidity 3+    Comorbidities  scolosis, DM1, chronic pain syndrome, HTN, afib, depression    Examination-Activity Limitations  Bathing;Dressing;Transfers;Caring for Others;Carry;Squat;Lift;Bend;Bed Mobility    Examination-Participation Restrictions  Yard Work;Laundry;Cleaning;Community Activity    Stability/Clinical Decision Making  Evolving/Moderate complexity    Clinical Decision Making  Moderate    Rehab Potential  Fair    Clinical Impairments Affecting Rehab Potential  comorbidities, symptom duration, curvature degree    PT Frequency  2x / week    PT Duration  8 weeks    PT Treatment/Interventions  ADLs/Self Care Home Management;Aquatic Therapy;Cryotherapy;Ultrasound;Parrafin;Traction;Moist Heat;Electrical Stimulation;DME Instruction;Gait training;Stair training;Functional mobility training;Neuromuscular re-education;Balance training;Therapeutic exercise;Therapeutic activities;Patient/family education;Energy conservation;Spinal Manipulations;Joint Manipulations;Passive range of motion;Dry needling;Manual techniques    PT Next Visit Plan  Progress Hip and core strengthening as tolerated    PT Home Exercise Plan  Added standing pec stretch in doorway to HEP today    Consulted and Agree with Plan of Care  Patient  Patient will benefit from skilled therapeutic intervention in order to improve the following deficits and impairments:  Abnormal gait, Decreased balance, Decreased endurance, Decreased mobility, Difficulty walking, Hypomobility, Increased muscle spasms, Decreased range of motion, Pain, Postural dysfunction, Impaired flexibility, Increased fascial restricitons, Decreased  strength, Decreased activity tolerance  Visit Diagnosis: Other idiopathic scoliosis, lumbar region  Radiculopathy, lumbar region     Problem List Patient Active Problem List   Diagnosis Date Noted  . Central sleep apnea 10/09/2016  . Hypertension 10/09/2016  . Lumbar postlaminectomy syndrome 10/09/2016  . Lumbar radiculopathy 10/09/2016  . Diabetic polyneuropathy ([place]) 10/07/2016  . Right lumbosacral radiculopathy 10/07/2016  . Healthcare maintenance 08/26/2016  . DKA (diabetic ketoacidoses) ([place]) 06/14/2016  . Atypical chest pain 02/23/2016  . SOB (shortness of breath) 02/23/2016  . Pain medication agreement signed 01/13/2015  . Acquired hypothyroidism 05/30/2014  . Frontal sinusitis 12/17/2013  . Atrial fibrillation ([place]) 11/29/2013  . Major depression in remission ([place]) 11/20/2013  . 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 Chronic pain syndrome 08/27/2013  . Back pain 07/17/2013  . HTN (hypertension), benign 07/17/2013  . Hyperlipidemia 07/17/2013  . Pancreatic insufficiency 07/17/2013  . Chronic postoperative pain 01/01/2013  . Long term current use of opiate analgesic 11/04/2012  . Hypothyroidism 08/25/2012  . Radiculopathy of leg 05/01/2011   Shelton Silvas PT, DPT Shelton Silvas 06/11/2019, 11:21 AM  [facility] [address], [place], [address] Phone: [phone]   Fax:  [phone]  Name: Colleen Payne MRN: [id number] Date of Birth: 1951-01-07

## 2019-06-14 ENCOUNTER — Ambulatory Visit: Payer: Medicare Other | Admitting: Physical Therapy

## 2019-06-18 ENCOUNTER — Other Ambulatory Visit: Payer: Self-pay

## 2019-06-18 ENCOUNTER — Ambulatory Visit: Payer: Medicare Other

## 2019-06-18 DIAGNOSIS — M4126 Other idiopathic scoliosis, lumbar region: Secondary | ICD-10-CM | POA: Diagnosis not present

## 2019-06-18 DIAGNOSIS — M5416 Radiculopathy, lumbar region: Secondary | ICD-10-CM

## 2019-06-18 NOTE — Therapy (Signed)
[facility] [address], [place], [address] Phone: [phone]   Fax:  [phone]  Physical Therapy Treatment  Patient Details  Name: Colleen Payne MRN: [id number] Date of Birth: 1950-08-20 No data recorded  Encounter Date: 06/18/2019  PT End of Session - 06/18/19 1057    Visit Number  75    Number of Visits  92    Date for PT Re-Evaluation  06/18/19    Authorization Type  Medicare    Authorization Time Period  6/10    PT Start Time  1050    PT Stop Time  1130    PT Time Calculation (min)  40 min    Activity Tolerance  Patient tolerated treatment well;No increased pain    Behavior During Therapy  WFL for tasks assessed/performed       Past Medical History:  Diagnosis Date  . Anxiety   . Atrial fibrillation ([place])   . Depression   . Diabetes mellitus without complication (HCC)    Type I   . Headache    stress. 1x/month  . Hypertension   . Insulin pump in place   . Motion sickness    cars  . MVP (mitral valve prolapse)   . Neuromuscular disorder (HCC)    leg weakness s/p back surgeries  . Thyroid disease     Past Surgical History:  Procedure Laterality Date  . BACK SURGERY  2004   3 sugeries between Sept and Nov, fusion L2-S1  . CATARACT EXTRACTION W/PHACO Left 11/23/2014   Procedure: CATARACT EXTRACTION PHACO AND INTRAOCULAR LENS PLACEMENT (IOC);  Surgeon: Leandrew Koyanagi, MD;  Location: [facility];  Service: Ophthalmology;  Laterality: Left;  DIABETIC - insulin pump    There were no vitals filed for this visit.  Subjective Assessment - 06/18/19 1054    Subjective  Pt missed last session due to difficulty with caregiver availability. Pt reports her DTR was sick last week.    Pertinent History  Patient is 69 yo female that complains of lumbar pain and inability to walk erect, as well as hip/abdominal pain. Patient with history of lumbar fusion (L2-S1). Xray results show:  Fairly stable appearance of the levoscoliosis of the lower thoracic and of the lumbar spine.Stable lateral subluxation toward the left of L2 with respect L3. Stable partial compression of the body of L1. Patient complains of frustration with physicians, and that her symptoms are worsening over time. 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 Reported that her pain and difficulty worsened after trying to pick her husband up about 1 year ago. Patient mentioned that she has been told that she has "spinal fluid pockets that are leaking". PMH also includes DM I with insulin pump,  afib, HTN,  HLD, chronic pain syndrome, SOB.    Currently in Pain?  Yes    Pain Score  7     Pain Location  --   low back pain   Pain Orientation  Left        INTERVENTION THIS DATE  -Thomas Test Stretch, simultaneous bilateral 3 minutes slight flexion, 2 minutes neutral (broken up due to elevated stiffness/pain this date) - concurrent with above, BUE wand flexion over x20 -NuSTEP for scapulothoracic AA/ROM, pain modulation: Seat 6, arms 7, level 0, 5 minutes, SPM goal>50 -Repeated Extension over chair back (arms across chest) 15x3secH (Grey chair + airex, towel over back) -Birddog 2x10 with childs  pose 72mn between intermittent minA to aid c LOB recovery for safety  -Prone on 3 pillows SLR extension 2x10 bilat -seated machine row 1x25 @ 5lb    PT Education - 06/18/19 1056    Education Details  exercise technique    Person(s) Educated  Patient    Methods  Explanation;Demonstration    Comprehension  Verbalized understanding;Returned demonstration       PT Short Term Goals - 07/27/18 0933      PT SHORT TERM GOAL #1   Title  Patient will be adherent to initial HEP at least 3x a week to improve functional strength and balance for better safety at home.    Time  4    Period  Weeks    Status  Achieved    Target Date  12/17/17        PT Long Term Goals - 04/26/19 1036       PT LONG TERM GOAL #1   Title  Patient will be independent in bending down towards floor and picking up small object (<5 pounds) and then stand back up without increased pain to improve ability to pick up and clean up room at home    Baseline  09/25/18/20 able to pick up object without increase in pain, though she reports continued baseline pain, goal revised and achieved    Time  8    Period  Weeks    Status  Achieved      PT LONG TERM GOAL #2   Title  Patient will increase BLE gross strength to 4+/5 as to improve functional strength for independent gait, increased standing tolerance and increased ADL ability.    Baseline  08/31/18 R/L flexion: 5/5 ER 5/5 IR 4+/4+ ABD 5/5 Ext 4+/4    Time  8    Period  Weeks    Status  Achieved      PT LONG TERM GOAL #3   Title  Patient will report a worst pain of 5/10 with transfers of her husband to improve tolerance with ADLs     Baseline  02/01/19 7/10 pain with transfers in the LB, some increased L shoulder pain with transfers; 03/19/19 continued 7/10 pain with transfers; 04/26/19 6/10 pain over the past week with activity    Time  8    Period  Weeks    Status  On-going      PT LONG TERM GOAL #4   Title  Pt will decrease 5TSTS to 10sec order to demonstrate clinically significant improvement in LE strength, and reduced fall risk    Baseline  04/26/19 10sec 03/19/19 10.6sec 02/01/19 10.76sec 12/07/18 17sec; 04/26/19    Time  8    Period  Weeks    Status  Achieved      PT LONG TERM GOAL #5   Title  Pt will increase 10MWT by at least 0.13 m/s in order to demonstrate clinically significant improvement in community ambulation    Baseline  02/01/19 m/s 09/25/18 0.83 07/31/18 0.759m 07/20/18 same 06/10/18 0.71559m   Time  8    Period  Weeks    Status  Achieved      Additional Long Term Goals   Additional Long Term Goals  Yes      PT LONG TERM GOAL #6   Title  Patient will demonstrate walk speed of 1.2 to demonstrate community ambulation norm    Baseline   03/26/19 1.76m/63m/26/21 1.76m/s91m11/21 0.976m/s72m16/20 0.56m/s 56m/20 0.59m/s 9/27m0 0.79m/s84m  Time  8    Period  Weeks    Status  On-going      PT LONG TERM GOAL #7   Title  Patient will increase standing tolerance to 11mn without increased pain in order to complete household chores/cooking    Baseline  03/19/19 can stand about 232ms before needing to sit for a break 02/01/19 about 1521m before needing a break11/16/20 Can stand only stand for 15 mins d/t decreased tolerance; 04/26/19 Can stand for 62m91mo complete ADLs with increased pain    Time  8    Period  Weeks    Status  Partially Met      PT LONG TERM GOAL #8   Title  Patient will demonstrate SLS of 10sec to demonstrate decreased fall risk    Baseline  04/26/19 RLE 7sec LLE 4sec 03/19/19 RLE 9.28 LLE 8.5 02/01/19 RLE 8.5sec  LLE 9sec 12/07/18 RLE 4sec LLE 4.2sec 11/19/18 RLE 4sec LLE 6sec    Time  8    Period  Weeks    Status  On-going      PT LONG TERM GOAL  #9   TITLE  Patient will demonstrate 15 STS in 30sec to demonstrate age-matched increase in LE endurance    Baseline  04/26/19 14    Time  8    Period  Weeks    Status  New            Plan - 06/18/19 1057    Clinical Impression Statement Continued with current plan of care, gently progressing patient's program aimed at address deficits and limitations identified in evaluation. Pt more stiff and painful this date after missing appointment earlier in week. Performed a trial of Nustep this date to facilitate pain modulation and AA/ROM of scapulothoracic region. Pt historically has poor tolerance to sustained walking. Pt has a favorable tolerance to Nustep. Pt also trialed on machine row with excellent demonstration of ROM potential. Pt continues to make steady progress toward treatment goals in general. Author provides extensive verbal, visual, and tactile cues when needed to assure all interventions are performed with desired form and good accuracy. Extensive communicaiton to  assure pt is able to perform all activities without exacerbation of pain or other symptoms.    Personal Factors and Comorbidities  Age;Behavior Pattern;Fitness;Past/Current Experience;Comorbidity 1;Comorbidity 2;Comorbidity 3+    Comorbidities  scolosis, DM1, chronic pain syndrome, HTN, afib, depression    Examination-Activity Limitations  Bathing;Dressing;Transfers;Caring for Others;Carry;Squat;Lift;Bend;Bed Mobility    Examination-Participation Restrictions  Yard Work;Laundry;Cleaning;Community Activity    Stability/Clinical Decision Making  Evolving/Moderate complexity    Clinical Decision Making  Moderate    Rehab Potential  Fair    Clinical Impairments Affecting Rehab Potential  comorbidities, symptom duration, curvature degree    PT Frequency  2x / week    PT Duration  8 weeks    PT Treatment/Interventions  ADLs/Self Care Home Management;Aquatic Therapy;Cryotherapy;Ultrasound;Parrafin;Traction;Moist Heat;Electrical Stimulation;DME Instruction;Gait training;Stair training;Functional mobility training;Neuromuscular re-education;Balance training;Therapeutic exercise;Therapeutic activities;Patient/family education;Energy conservation;Spinal Manipulations;Joint Manipulations;Passive range of motion;Dry needling;Manual techniques    PT Next Visit Plan  Progress Hip and core strengthening as tolerated    PT Home Exercise Plan  Added standing pec stretch in doorway to HEP today    Consulted and Agree with Plan of Care  Patient       Patient will benefit from skilled therapeutic intervention in order to improve the following deficits and impairments:  Abnormal gait, Decreased balance, Decreased endurance, Decreased mobility, Difficulty walking, Hypomobility, Increased muscle spasms, Decreased range  of motion, Pain, Postural dysfunction, Impaired flexibility, Increased fascial restricitons, Decreased strength, Decreased activity tolerance  Visit Diagnosis: Other idiopathic scoliosis, lumbar  region  Radiculopathy, lumbar region     Problem List Patient Active Problem List   Diagnosis Date Noted  . Central sleep apnea 10/09/2016  . Hypertension 10/09/2016  . Lumbar postlaminectomy syndrome 10/09/2016  . Lumbar radiculopathy 10/09/2016  . Diabetic polyneuropathy ([place]) 10/07/2016  . Right lumbosacral radiculopathy 10/07/2016  . Healthcare maintenance 08/26/2016  . DKA (diabetic ketoacidoses) ([place]) 06/14/2016  . Atypical chest pain 02/23/2016  . SOB (shortness of breath) 02/23/2016  . Pain medication agreement signed 01/13/2015  . Acquired hypothyroidism 05/30/2014  . Frontal sinusitis 12/17/2013  . Atrial fibrillation ([place]) 11/29/2013  . Major depression in remission ([place]) 11/20/2013  . 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 Chronic pain syndrome 08/27/2013  . Back pain 07/17/2013  . HTN (hypertension), benign 07/17/2013  . Hyperlipidemia 07/17/2013  . Pancreatic insufficiency 07/17/2013  . Chronic postoperative pain 01/01/2013  . Long term current use of opiate analgesic 11/04/2012  . Hypothyroidism 08/25/2012  . Radiculopathy of leg 05/01/2011   11:34 AM, 06/18/19 Etta Grandchild, PT, DPT Physical Therapist - [place] [phone] (Office)    Josee Speece C 06/18/2019, 11:02 AM  [facility] [address], [place], [address] Phone: [phone]   Fax:  [phone]  Name: LUMINA GITTO MRN: [id number] Date of Birth: 1950/12/24

## 2019-06-23 ENCOUNTER — Ambulatory Visit: Payer: Medicare Other | Attending: Critical Care Medicine

## 2019-06-23 DIAGNOSIS — Z23 Encounter for immunization: Secondary | ICD-10-CM

## 2019-06-23 NOTE — Progress Notes (Signed)
   Covid-19 Vaccination Clinic  Name:  Colleen Payne    MRN: [id number] DOB: 07-25-1950  06/23/2019  [name] was observed post Covid-19 immunization for 15 minutes without incident. She was provided with Vaccine Information Sheet and instruction to access the V-Safe system.   [name] was instructed to call 911 with any severe reactions post vaccine: Marland Kitchen Difficulty breathing  . Swelling of face and throat  . A fast heartbeat  . A bad rash all over body  . Dizziness and weakness   Immunizations Administered    Name Date Dose VIS Date Route   Moderna COVID-19 Vaccine 06/23/2019  2:58 PM 0.5 mL 12/2018 Intramuscular   Manufacturer: Moderna   Lot: [id number]   [id number]

## 2019-06-25 ENCOUNTER — Ambulatory Visit: Payer: Medicare Other | Admitting: Physical Therapy

## 2019-06-28 ENCOUNTER — Ambulatory Visit: Payer: Medicare Other | Admitting: Physical Therapy

## 2019-07-02 ENCOUNTER — Ambulatory Visit: Payer: Medicare Other | Admitting: Physical Therapy

## 2019-07-05 ENCOUNTER — Ambulatory Visit: Payer: Medicare Other | Admitting: Physical Therapy

## 2019-07-09 ENCOUNTER — Other Ambulatory Visit: Payer: Self-pay

## 2019-07-09 ENCOUNTER — Encounter: Payer: Self-pay | Admitting: Physical Therapy

## 2019-07-09 ENCOUNTER — Ambulatory Visit: Payer: Medicare Other | Attending: Anesthesiology | Admitting: Physical Therapy

## 2019-07-09 DIAGNOSIS — M5416 Radiculopathy, lumbar region: Secondary | ICD-10-CM | POA: Insufficient documentation

## 2019-07-09 DIAGNOSIS — M4126 Other idiopathic scoliosis, lumbar region: Secondary | ICD-10-CM | POA: Insufficient documentation

## 2019-07-09 NOTE — Therapy (Signed)
[facility] [address], [place], [address] Phone: [phone]   Fax:  [phone]  Physical Therapy Treatment  Patient Details  Name: Colleen Payne MRN: [id number] Date of Birth: [birth year] No data recorded  Encounter Date: 07/09/2019   PT End of Session - 07/09/19 1048    Visit Number 46    Number of Visits 92    Date for PT Re-Evaluation 06/18/19    Authorization Type Medicare    Authorization Time Period 6/10    PT Start Time 1040    PT Stop Time 1120    PT Time Calculation (min) 40 min    Activity Tolerance Patient tolerated treatment well;No increased pain    Behavior During Therapy WFL for tasks assessed/performed           Past Medical History:  Diagnosis Date  . Anxiety   . Atrial fibrillation ([place])   . Depression   . Diabetes mellitus without complication (HCC)    Type I   . Headache    stress. 1x/month  . Hypertension   . Insulin pump in place   . Motion sickness    cars  . MVP (mitral valve prolapse)   . Neuromuscular disorder (HCC)    leg weakness s/p back surgeries  . Thyroid disease     Past Surgical History:  Procedure Laterality Date  . BACK SURGERY  2004   3 sugeries between Sept and Nov, fusion L2-S1  . CATARACT EXTRACTION W/PHACO Left 11/23/2014   Procedure: CATARACT EXTRACTION PHACO AND INTRAOCULAR LENS PLACEMENT (IOC);  Surgeon: Colleen Koyanagi, MD;  Location: [facility];  Service: Ophthalmology;  Laterality: Left;  DIABETIC - insulin pump    There were no vitals filed for this visit.   Subjective Assessment - 07/09/19 1041    Subjective Pt arrives for screen following insurance lapse from treatment, reporting she had a fall when she was rushing to use the restroom and fell onto her bath tub hitting her bottom more so on the R side. Reports pain in this area is 7/10.    Pertinent History Patient is 69 yo female that complains of lumbar pain and  inability to walk erect, as well as hip/abdominal pain. Patient with history of lumbar fusion (L2-S1). Xray results show: Fairly stable appearance of the levoscoliosis of the lower thoracic and of the lumbar spine.Stable lateral subluxation toward the left of L2 with respect L3. Stable partial compression of the body of L1. Patient complains of frustration with physicians, and that her symptoms are worsening over time. 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 Reported that her pain and difficulty worsened after trying to pick her husband up about 1 year ago. Patient mentioned that she has been told that she has "spinal fluid pockets that are leaking". PMH also includes DM I with insulin pump,  afib, HTN,  HLD, chronic pain syndrome, SOB.    Limitations Standing;Walking;House hold activities;Other (comment)              Ther-Ex  -Thomas Test Stretch, simultaneous bilateral 1x64mnutes -concurrent with above, BUE wand flexionover x10 - wide childs pose 3x 30sec increasing stretch each bout - Prone press up2x 12 with encouragement needed, unable to achieve full lift, rest in prone prop 2x 140m  - Cat/Cow x12 with cuing for breath control/tecnique with good carry over - Sidelying open book x12 bilat with cuing for breath control with  good carry over - Hip hike with CL hip swing x15 each LE; horizontal swing x15 each LE -Backwardwalking1.8mh for533ms,cuing forposture and "large steps" with decent carry over. Pt able to maintain more thoracic ext with cuing to "keep eyes up"  Fracture screening at scarum/coccyx, tender to palpation but able to tolerate deep palpation, presence of fracture unlikely                     PT Education - 07/09/19 1048    Education Details therex form    Person(s) Educated Patient    Methods Explanation;Demonstration;Verbal cues    Comprehension Verbalized understanding;Returned demonstration;Verbal  cues required            PT Short Term Goals - 07/27/18 0933      PT SHORT TERM GOAL #1   Title Patient will be adherent to initial HEP at least 3x a week to improve functional strength and balance for better safety at home.    Time 4    Period Weeks    Status Achieved    Target Date 12/17/17             PT Long Term Goals - 04/26/19 1036      PT LONG TERM GOAL #1   Title Patient will be independent in bending down towards floor and picking up small object (<5 pounds) and then stand back up without increased pain to improve ability to pick up and clean up room at home    Baseline 09/25/18/20 able to pick up object without increase in pain, though she reports continued baseline pain, goal revised and achieved    Time 8    Period Weeks    Status Achieved      PT LONG TERM GOAL #2   Title Patient will increase BLE gross strength to 4+/5 as to improve functional strength for independent gait, increased standing tolerance and increased ADL ability.    Baseline 08/31/18 R/L flexion: 5/5 ER 5/5 IR 4+/4+ ABD 5/5 Ext 4+/4    Time 8    Period Weeks    Status Achieved      PT LONG TERM GOAL #3   Title Patient will report a worst pain of 5/10 with transfers of her husband to improve tolerance with ADLs     Baseline 02/01/19 7/10 pain with transfers in the LB, some increased L shoulder pain with transfers; 03/19/19 continued 7/10 pain with transfers; 04/26/19 6/10 pain over the past week with activity    Time 8    Period Weeks    Status On-going      PT LONG TERM GOAL #4   Title Pt will decrease 5TSTS to 10sec order to demonstrate clinically significant improvement in LE strength, and reduced fall risk    Baseline 04/26/19 10sec 03/19/19 10.6sec 02/01/19 10.76sec 12/07/18 17sec; 04/26/19    Time 8    Period Weeks    Status Achieved      PT LONG TERM GOAL #5   Title Pt will increase 10MWT by at least 0.13 m/s in order to demonstrate clinically significant improvement in community ambulation     Baseline 02/01/19 m/s 09/25/18 0.83 07/31/18 0.7790m6/29/20 same 06/10/18 0.24m23m  Time 8    Period Weeks    Status Achieved      Additional Long Term Goals   Additional Long Term Goals Yes      PT LONG TERM GOAL #6   Title Patient will demonstrate walk speed of  1.2 to demonstrate community ambulation norm    Baseline 03/26/19 1.27ms 03/19/19 1.079m 02/01/19 0.9033m11/16/20 0.27m47m/21/20 0.32m/s17m4/20 0.79m/s14mTime 8    Period Weeks    Status On-going      PT LONG TERM GOAL #7   Title Patient will increase standing tolerance to 30min 532mout increased pain in order to complete household chores/cooking    Baseline 03/19/19 can stand about 20mins 34mre needing to sit for a break 02/01/19 about 15mins b33me needing a break11/16/20 Can stand only stand for 15 mins d/t decreased tolerance; 04/26/19 Can stand for 30min to 63mlete ADLs with increased pain    Time 8    Period Weeks    Status Partially Met      PT LONG TERM GOAL #8   Title Patient will demonstrate SLS of 10sec to demonstrate decreased fall risk    Baseline 04/26/19 RLE 7sec LLE 4sec 03/19/19 RLE 9.28 LLE 8.5 02/01/19 RLE 8.5sec  LLE 9sec 12/07/18 RLE 4sec LLE 4.2sec 11/19/18 RLE 4sec LLE 6sec    Time 8    Period Weeks    Status On-going      PT LONG TERM GOAL  #9   TITLE Patient will demonstrate 15 STS in 30sec to demonstrate age-matched increase in LE endurance    Baseline 04/26/19 14    Time 8    Period Weeks    Status New                 Plan - 07/09/19 1109    Clinical Impression Statement PT screened patient d/t insurance lapse and recent fall. Patient screened for fracture, does not seem to be at risk for this, advised to reach out to PCP should pain worsen. PT continued to led patient through mobility and core strengthening to increase motion and decrease pain with good success.    Personal Factors and Comorbidities Age;Behavior Pattern;Fitness;Past/Current Experience;Comorbidity 1;Comorbidity 2;Comorbidity 3+     Comorbidities scolosis, DM1, chronic pain syndrome, HTN, afib, depression    Examination-Activity Limitations Bathing;Dressing;Transfers;Caring for Others;Carry;Squat;Lift;Bend;Bed Mobility    Examination-Participation Restrictions Yard Work;Laundry;Cleaning;Community Activity    Stability/Clinical Decision Making Evolving/Moderate complexity    Clinical Decision Making Moderate    Rehab Potential Fair    Clinical Impairments Affecting Rehab Potential comorbidities, symptom duration, curvature degree    PT Frequency 2x / week    PT Duration 8 weeks    PT Treatment/Interventions ADLs/Self Care Home Management;Aquatic Therapy;Cryotherapy;Ultrasound;Parrafin;Traction;Moist Heat;Electrical Stimulation;DME Instruction;Gait training;Stair training;Functional mobility training;Neuromuscular re-education;Balance training;Therapeutic exercise;Therapeutic activities;Patient/family education;Energy conservation;Spinal Manipulations;Joint Manipulations;Passive range of motion;Dry needling;Manual techniques    PT Next Visit Plan Progress Hip and core strengthening as tolerated    PT Home Exercise Plan Added standing pec stretch in doorway to HEP today    Consulted and Agree with Plan of Care Patient           Patient will benefit from skilled therapeutic intervention in order to improve the following deficits and impairments:  Abnormal gait, Decreased balance, Decreased endurance, Decreased mobility, Difficulty walking, Hypomobility, Increased muscle spasms, Decreased range of motion, Pain, Postural dysfunction, Impaired flexibility, Increased fascial restricitons, Decreased strength, Decreased activity tolerance  Visit Diagnosis: Other idiopathic scoliosis, lumbar region  Radiculopathy, lumbar region     Problem List Patient Active Problem List   Diagnosis Date Noted  . Central sleep apnea 10/09/2016  . Hypertension 10/09/2016  . Lumbar postlaminectomy syndrome 10/09/2016  . Lumbar  radiculopathy 10/09/2016  . Diabetic polyneuropathy (HCC) 09/17[place]18  . Right lumbosacral  radiculopathy 10/07/2016  . Healthcare maintenance 08/26/2016  . DKA (diabetic ketoacidoses) ([place]) 06/14/2016  . Atypical chest pain 02/23/2016  . SOB (shortness of breath) 02/23/2016  . Pain medication agreement signed 01/13/2015  . Acquired hypothyroidism 05/30/2014  . Frontal sinusitis 12/17/2013  . Atrial fibrillation ([place]) 11/29/2013  . Major depression in remission ([place]) 11/20/2013  . 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 Chronic pain syndrome 08/27/2013  . Back pain 07/17/2013  . HTN (hypertension), benign 07/17/2013  . Hyperlipidemia 07/17/2013  . Pancreatic insufficiency 07/17/2013  . Chronic postoperative pain 01/01/2013  . Long term current use of opiate analgesic 11/04/2012  . Hypothyroidism 08/25/2012  . Radiculopathy of leg 05/01/2011   Durwin Reges DPT Durwin Reges 07/09/2019, 11:23 AM  [facility] [address] [place], [address] Phone: [phone]   Fax:  [phone]  Name: CALLI BASHOR MRN: [id number] Date of Birth: [birth year]

## 2019-07-12 ENCOUNTER — Encounter: Payer: Medicare Other | Admitting: Physical Therapy

## 2019-07-16 ENCOUNTER — Ambulatory Visit: Payer: Medicare Other | Admitting: Physical Therapy

## 2019-07-19 ENCOUNTER — Encounter: Payer: Medicare Other | Admitting: Physical Therapy

## 2019-07-21 ENCOUNTER — Ambulatory Visit: Payer: Medicare Other | Attending: Critical Care Medicine

## 2019-07-21 DIAGNOSIS — Z23 Encounter for immunization: Secondary | ICD-10-CM

## 2019-07-21 NOTE — Progress Notes (Signed)
   Covid-19 Vaccination Clinic  Name:  Colleen Payne    MRN: [id number] DOB: 08/30/1950  07/21/2019  [name] was observed post Covid-19 immunization for 15 minutes without incident. She was provided with Vaccine Information Sheet and instruction to access the V-Safe system.   [name] was instructed to call 911 with any severe reactions post vaccine: Marland Kitchen Difficulty breathing  . Swelling of face and throat  . A fast heartbeat  . A bad rash all over body  . Dizziness and weakness   Immunizations Administered    Name Date Dose VIS Date Route   Moderna COVID-19 Vaccine 07/21/2019  2:00 PM 0.5 mL 12/2018 Intramuscular   Manufacturer: Moderna   Lot: [id number]   [place]: [phone]

## 2019-07-23 ENCOUNTER — Encounter: Payer: Medicare Other | Admitting: Physical Therapy

## 2019-07-30 ENCOUNTER — Encounter: Payer: Medicare Other | Admitting: Physical Therapy

## 2019-08-02 ENCOUNTER — Encounter: Payer: Medicare Other | Admitting: Physical Therapy

## 2019-08-06 ENCOUNTER — Other Ambulatory Visit: Payer: Self-pay

## 2019-08-06 ENCOUNTER — Ambulatory Visit: Payer: Medicare Other | Attending: Anesthesiology | Admitting: Physical Therapy

## 2019-08-06 DIAGNOSIS — M5416 Radiculopathy, lumbar region: Secondary | ICD-10-CM

## 2019-08-06 DIAGNOSIS — M4126 Other idiopathic scoliosis, lumbar region: Secondary | ICD-10-CM | POA: Insufficient documentation

## 2019-08-06 NOTE — Therapy (Signed)
[facility] [address] [place], [address] Phone: [phone]   Fax:  [phone]  Physical Therapy Treatment  Patient Details  Name: Colleen Payne MRN: [id number] Date of Birth: [birth year] No data recorded  Encounter Date: 08/06/2019   PT End of Session - 08/06/19 1047    Visit Number 77    Number of Visits 92    Date for PT Re-Evaluation 06/18/19    PT Start Time 1030    PT Stop Time 1110    PT Time Calculation (min) 40 min    Activity Tolerance Patient tolerated treatment well;No increased pain    Behavior During Therapy WFL for tasks assessed/performed           Past Medical History:  Diagnosis Date  . Anxiety   . Atrial fibrillation ([place])   . Depression   . Diabetes mellitus without complication (HCC)    Type I   . Headache    stress. 1x/month  . Hypertension   . Insulin pump in place   . Motion sickness    cars  . MVP (mitral valve prolapse)   . Neuromuscular disorder (HCC)    leg weakness s/p back surgeries  . Thyroid disease     Past Surgical History:  Procedure Laterality Date  . BACK SURGERY  2004   3 sugeries between Sept and Nov, fusion L2-S1  . CATARACT EXTRACTION W/PHACO Left 11/23/2014   Procedure: CATARACT EXTRACTION PHACO AND INTRAOCULAR LENS PLACEMENT (IOC);  Surgeon: Leandrew Koyanagi, MD;  Location: [facility];  Service: Ophthalmology;  Laterality: Left;  DIABETIC - insulin pump    There were no vitals filed for this visit.   Subjective Assessment - 08/06/19 1040    Subjective Patient reports she feels much worse not being able to come to therapy every week, reporting she feels "very stiff" with 7/10 pain this am. Is trying to complete HEP but finds it very difficult at  home d/t caretaking for her husband.    Pertinent History Patient is 69 yo female that complains of lumbar pain and inability to walk erect, as well as hip/abdominal pain. Patient with  history of lumbar fusion (L2-S1). Xray results show: Fairly stable appearance of the levoscoliosis of the lower thoracic and of the lumbar spine.Stable lateral subluxation toward the left of L2 with respect L3. Stable partial compression of the body of L1. Patient complains of frustration with physicians, and that her symptoms are worsening over time. 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 Reported that her pain and difficulty worsened after trying to pick her husband up about 1 year ago. Patient mentioned that she has been told that she has "spinal fluid pockets that are leaking". PMH also includes DM I with insulin pump,  afib, HTN,  HLD, chronic pain syndrome, SOB.    Limitations Standing;Walking;House hold activities;Other (comment)    Diagnostic tests see exray results: Fairly stable appearance of the levoscoliosis of the lower thoracic and lumbar spine. Stable lateral subluxation toward left of L2 with respect of L3, stable partial compression of the body of L1.    Patient Stated Goals pain relief, standing erect    Pain Onset More than a month ago    Pain Onset More than a month ago           Ther-Ex  -Motorola, simultaneous bilateral 1x29mnutes -concurrent with above, BUE wand flexionover x10 -  Prone press up2x 12 with encouragement needed, unable to achieve full lift, rest in prone prop 2x 51mn  - Back ext in standing flexed over bolster 3x 8 with min TC overpressure at end range for full ext - Hip hike with CL hip swing x15 each LE; horizontal swing x15 each LE -Backwardwalking1.025m for5m13m,cuing forposture and "large steps" with decent carry over. Pt able to maintain more thoracic ext with cuing to "keep eyes up"                           PT Education - 08/06/19 1047    Education Details therex form    Person(s) Educated Patient    Methods Explanation;Demonstration;Verbal cues     Comprehension Verbalized understanding;Returned demonstration;Verbal cues required            PT Short Term Goals - 07/27/18 0933      PT SHORT TERM GOAL #1   Title Patient will be adherent to initial HEP at least 3x a week to improve functional strength and balance for better safety at home.    Time 4    Period Weeks    Status Achieved    Target Date 12/17/17             PT Long Term Goals - 04/26/19 1036      PT LONG TERM GOAL #1   Title Patient will be independent in bending down towards floor and picking up small object (<5 pounds) and then stand back up without increased pain to improve ability to pick up and clean up room at home    Baseline 09/25/18/20 able to pick up object without increase in pain, though she reports continued baseline pain, goal revised and achieved    Time 8    Period Weeks    Status Achieved      PT LONG TERM GOAL #2   Title Patient will increase BLE gross strength to 4+/5 as to improve functional strength for independent gait, increased standing tolerance and increased ADL ability.    Baseline 08/31/18 R/L flexion: 5/5 ER 5/5 IR 4+/4+ ABD 5/5 Ext 4+/4    Time 8    Period Weeks    Status Achieved      PT LONG TERM GOAL #3   Title Patient will report a worst pain of 5/10 with transfers of her husband to improve tolerance with ADLs     Baseline 02/01/19 7/10 pain with transfers in the LB, some increased L shoulder pain with transfers; 03/19/19 continued 7/10 pain with transfers; 04/26/19 6/10 pain over the past week with activity    Time 8    Period Weeks    Status On-going      PT LONG TERM GOAL #4   Title Pt will decrease 5TSTS to 10sec order to demonstrate clinically significant improvement in LE strength, and reduced fall risk    Baseline 04/26/19 10sec 03/19/19 10.6sec 02/01/19 10.76sec 12/07/18 17sec; 04/26/19    Time 8    Period Weeks    Status Achieved      PT LONG TERM GOAL #5   Title Pt will increase 10MWT by at least 0.13 m/s in order to  demonstrate clinically significant improvement in community ambulation    Baseline 02/01/19 m/s 09/25/18 0.83 07/31/18 0.1m38m/29/20 same 06/10/18 0.52m/21m Time 8    Period Weeks    Status Achieved      Additional Long Term Goals  Additional Long Term Goals Yes      PT LONG TERM GOAL #6   Title Patient will demonstrate walk speed of 1.2 to demonstrate community ambulation norm    Baseline 03/26/19 1.21ms 03/19/19 1.027m 02/01/19 0.9020m11/16/20 0.22m74m/21/20 0.27m/s61m4/20 0.28m/s51mTime 8    Period Weeks    Status On-going      PT LONG TERM GOAL #7   Title Patient will increase standing tolerance to 30min 60mout increased pain in order to complete household chores/cooking    Baseline 03/19/19 can stand about 20mins 90mre needing to sit for a break 02/01/19 about 15mins b60me needing a break11/16/20 Can stand only stand for 15 mins d/t decreased tolerance; 04/26/19 Can stand for 30min to 35mlete ADLs with increased pain    Time 8    Period Weeks    Status Partially Met      PT LONG TERM GOAL #8   Title Patient will demonstrate SLS of 10sec to demonstrate decreased fall risk    Baseline 04/26/19 RLE 7sec LLE 4sec 03/19/19 RLE 9.28 LLE 8.5 02/01/19 RLE 8.5sec  LLE 9sec 12/07/18 RLE 4sec LLE 4.2sec 11/19/18 RLE 4sec LLE 6sec    Time 8    Period Weeks    Status On-going      PT LONG TERM GOAL  #9   TITLE Patient will demonstrate 15 STS in 30sec to demonstrate age-matched increase in LE endurance    Baseline 04/26/19 14    Time 8    Period Weeks    Status New                 Plan - 08/06/19 1112    Clinical Impression Statement Patient continues to demonstrate decline following decreased PT sessions d/t insurance authorization. She is slower to obtain positions, needs more assistance, etc. but is motivated to comoplete all planned therex for maintenance of extension and decreased pain. PT will continue screening as needed until patient is able to have a more consistent PT schedule  w/ insurance.    Personal Factors and Comorbidities Age;Behavior Pattern;Fitness;Past/Current Experience;Comorbidity 1;Comorbidity 2;Comorbidity 3+    Comorbidities scolosis, DM1, chronic pain syndrome, HTN, afib, depression    Examination-Activity Limitations Bathing;Dressing;Transfers;Caring for Others;Carry;Squat;Lift;Bend;Bed Mobility    Examination-Participation Restrictions Yard Work;Laundry;Cleaning;Community Activity    Stability/Clinical Decision Making Evolving/Moderate complexity    Clinical Decision Making Moderate    Rehab Potential Fair    Clinical Impairments Affecting Rehab Potential comorbidities, symptom duration, curvature degree    PT Frequency 2x / week    PT Duration 8 weeks    PT Treatment/Interventions ADLs/Self Care Home Management;Aquatic Therapy;Cryotherapy;Ultrasound;Parrafin;Traction;Moist Heat;Electrical Stimulation;DME Instruction;Gait training;Stair training;Functional mobility training;Neuromuscular re-education;Balance training;Therapeutic exercise;Therapeutic activities;Patient/family education;Energy conservation;Spinal Manipulations;Joint Manipulations;Passive range of motion;Dry needling;Manual techniques    PT Next Visit Plan Progress Hip and core strengthening as tolerated    PT Home Exercise Plan Added standing pec stretch in doorway to HEP today    Consulted and Agree with Plan of Care Patient           Patient will benefit from skilled therapeutic intervention in order to improve the following deficits and impairments:  Abnormal gait, Decreased balance, Decreased endurance, Decreased mobility, Difficulty walking, Hypomobility, Increased muscle spasms, Decreased range of motion, Pain, Postural dysfunction, Impaired flexibility, Increased fascial restricitons, Decreased strength, Decreased activity tolerance  Visit Diagnosis: Other idiopathic scoliosis, lumbar region  Radiculopathy, lumbar region     Problem List Patient Active Problem List    Diagnosis Date Noted  .  Central sleep apnea 10/09/2016  . Hypertension 10/09/2016  . Lumbar postlaminectomy syndrome 10/09/2016  . Lumbar radiculopathy 10/09/2016  . Diabetic polyneuropathy ([place]) 10/07/2016  . Right lumbosacral radiculopathy 10/07/2016  . Healthcare maintenance 08/26/2016  . DKA (diabetic ketoacidoses) ([place]) 06/14/2016  . Atypical chest pain 02/23/2016  . SOB (shortness of breath) 02/23/2016  . Pain medication agreement signed 01/13/2015  . Acquired hypothyroidism 05/30/2014  . Frontal sinusitis 12/17/2013  . Atrial fibrillation ([place]) 11/29/2013  . Major depression in remission ([place]) 11/20/2013  . 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 Chronic pain syndrome 08/27/2013  . Back pain 07/17/2013  . HTN (hypertension), benign 07/17/2013  . Hyperlipidemia 07/17/2013  . Pancreatic insufficiency 07/17/2013  . Chronic postoperative pain 01/01/2013  . Long term current use of opiate analgesic 11/04/2012  . Hypothyroidism 08/25/2012  . Radiculopathy of leg 05/01/2011   Durwin Reges DPT Durwin Reges 08/06/2019, 11:17 AM  [facility] [address], [place], [address] Phone: [phone]   Fax:  [phone]  Name: Colleen Payne MRN: [id number] Date of Birth: 02-05-1950

## 2019-08-09 ENCOUNTER — Encounter: Payer: Medicare Other | Admitting: Physical Therapy

## 2019-08-13 ENCOUNTER — Encounter: Payer: Medicare Other | Admitting: Physical Therapy

## 2019-08-16 ENCOUNTER — Encounter: Payer: Medicare Other | Admitting: Physical Therapy

## 2019-08-20 ENCOUNTER — Encounter: Payer: Medicare Other | Admitting: Physical Therapy

## 2019-08-27 ENCOUNTER — Ambulatory Visit: Payer: Medicare Other | Admitting: Physical Therapy

## 2019-08-27 ENCOUNTER — Encounter: Payer: Medicare Other | Admitting: Physical Therapy

## 2019-08-30 ENCOUNTER — Encounter: Payer: Medicare Other | Admitting: Physical Therapy

## 2019-09-03 ENCOUNTER — Ambulatory Visit: Payer: Medicare Other | Admitting: Physical Therapy

## 2019-09-03 ENCOUNTER — Ambulatory Visit: Payer: Medicare Other | Attending: Anesthesiology | Admitting: Physical Therapy

## 2019-09-03 ENCOUNTER — Other Ambulatory Visit: Payer: Self-pay

## 2019-09-03 ENCOUNTER — Encounter: Payer: Self-pay | Admitting: Physical Therapy

## 2019-09-03 DIAGNOSIS — M4126 Other idiopathic scoliosis, lumbar region: Secondary | ICD-10-CM | POA: Insufficient documentation

## 2019-09-03 DIAGNOSIS — M5416 Radiculopathy, lumbar region: Secondary | ICD-10-CM

## 2019-09-03 NOTE — Therapy (Signed)
[facility] [address], [place], [address] Phone: [phone]   Fax:  [phone]  Physical Therapy Treatment  Patient Details  Name: Colleen Payne MRN: [id number] Date of Birth: Sep 27, 1950 No data recorded  Encounter Date: 09/03/2019   PT End of Session - 09/03/19 1118    Visit Number 78    Number of Visits 92    Date for PT Re-Evaluation 06/18/19    PT Start Time 8329    PT Stop Time 1916    PT Time Calculation (min) 39 min    Activity Tolerance Patient tolerated treatment well;No increased pain    Behavior During Therapy WFL for tasks assessed/performed           Past Medical History:  Diagnosis Date  . Anxiety   . Atrial fibrillation ([place])   . Depression   . Diabetes mellitus without complication (HCC)    Type I   . Headache    stress. 1x/month  . Hypertension   . Insulin pump in place   . Motion sickness    cars  . MVP (mitral valve prolapse)   . Neuromuscular disorder (HCC)    leg weakness s/p back surgeries  . Thyroid disease     Past Surgical History:  Procedure Laterality Date  . BACK SURGERY  2004   3 sugeries between Sept and Nov, fusion L2-S1  . CATARACT EXTRACTION W/PHACO Left 11/23/2014   Procedure: CATARACT EXTRACTION PHACO AND INTRAOCULAR LENS PLACEMENT (IOC);  Surgeon: Leandrew Koyanagi, MD;  Location: [facility];  Service: Ophthalmology;  Laterality: Left;  DIABETIC - insulin pump    There were no vitals filed for this visit.   Subjective Assessment - 09/03/19 1105    Subjective Reports she feels increasingly stiff following absence from regular PT d/t insurance. Patient is attempting therex at home but reports it is difficult as she is her husbands caregiver at home. Reports 7/10 pain today.    Pertinent History Patient is 69 yo female that complains of lumbar pain and inability to walk erect, as well as hip/abdominal pain. Patient with history of lumbar  fusion (L2-S1). Xray results show: Fairly stable appearance of the levoscoliosis of the lower thoracic and of the lumbar spine.Stable lateral subluxation toward the left of L2 with respect L3. Stable partial compression of the body of L1. Patient complains of frustration with physicians, and that her symptoms are worsening over time. 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 Reported that her pain and difficulty worsened after trying to pick her husband up about 1 year ago. Patient mentioned that she has been told that she has "spinal fluid pockets that are leaking". PMH also includes DM I with insulin pump,  afib, HTN,  HLD, chronic pain syndrome, SOB.    Diagnostic tests see exray results: Fairly stable appearance of the levoscoliosis of the lower thoracic and lumbar spine. Stable lateral subluxation toward left of L2 with respect of L3, stable partial compression of the body of L1.    Patient Stated Goals pain relief, standing erect    Pain Onset More than a month ago    Pain Onset More than a month ago           Ther-Ex -Motorola, simultaneous bilateral x53mnutes -concurrent with above, BUE wand flexionx20 with 136m hold on last - Prone press upx15 with min cuing for increased ROM, increased ROM throughout sets  rest in prone prop 2x 52mn - Prone alt supermans 2x 10 with min cuing for increased ROM with good carry over  - Prone ext 2x 8 attempting to lift head and chest off table with cuing for scapular retraction with good carry over - Back ext in standing flexed over bolster 2x 10 with min TC overpressure at end range for full ext Reports R sided "cramping sensation" relieved with 10x 10sec hold L lateral theraball rollouts in sitting - Hip hike with CL hip swing x15 each LE; horizontal swing x15 each LE -Backwardwalking1.044m for5m61m,cuing forposture and "large steps" with decent carry over. Pt able to maintain more thoracic ext  with cuing to "keep eyes up"                          PT Education - 09/03/19 1117    Education Details therex form/technique    Person(s) Educated Patient    Methods Explanation;Demonstration;Verbal cues    Comprehension Verbal cues required;Returned demonstration;Verbalized understanding            PT Short Term Goals - 07/27/18 0933      PT SHORT TERM GOAL #1   Title Patient will be adherent to initial HEP at least 3x a week to improve functional strength and balance for better safety at home.    Time 4    Period Weeks    Status Achieved    Target Date 12/17/17             PT Long Term Goals - 04/26/19 1036      PT LONG TERM GOAL #1   Title Patient will be independent in bending down towards floor and picking up small object (<5 pounds) and then stand back up without increased pain to improve ability to pick up and clean up room at home    Baseline 09/25/18/20 able to pick up object without increase in pain, though she reports continued baseline pain, goal revised and achieved    Time 8    Period Weeks    Status Achieved      PT LONG TERM GOAL #2   Title Patient will increase BLE gross strength to 4+/5 as to improve functional strength for independent gait, increased standing tolerance and increased ADL ability.    Baseline 08/31/18 R/L flexion: 5/5 ER 5/5 IR 4+/4+ ABD 5/5 Ext 4+/4    Time 8    Period Weeks    Status Achieved      PT LONG TERM GOAL #3   Title Patient will report a worst pain of 5/10 with transfers of her husband to improve tolerance with ADLs     Baseline 02/01/19 7/10 pain with transfers in the LB, some increased L shoulder pain with transfers; 03/19/19 continued 7/10 pain with transfers; 04/26/19 6/10 pain over the past week with activity    Time 8    Period Weeks    Status On-going      PT LONG TERM GOAL #4   Title Pt will decrease 5TSTS to 10sec order to demonstrate clinically significant improvement in LE strength, and reduced  fall risk    Baseline 04/26/19 10sec 03/19/19 10.6sec 02/01/19 10.76sec 12/07/18 17sec; 04/26/19    Time 8    Period Weeks    Status Achieved      PT LONG TERM GOAL #5   Title Pt will increase 10MWT by at least 0.13 m/s in order to demonstrate clinically significant improvement in community  ambulation    Baseline 02/01/19 m/s 09/25/18 0.83 07/31/18 0.43ms 07/20/18 same 06/10/18 0.732m    Time 8    Period Weeks    Status Achieved      Additional Long Term Goals   Additional Long Term Goals Yes      PT LONG TERM GOAL #6   Title Patient will demonstrate walk speed of 1.2 to demonstrate community ambulation norm    Baseline 03/26/19 1.27m327m2/26/21 1.27m/39m/11/21 0.927m/67m/16/20 0.17m/s92m1/20 0.71m/s 986m20 0.34m/s  76mme 8    Period Weeks    Status On-going      PT LONG TERM GOAL #7   Title Patient will increase standing tolerance to 327min wi74mt increased pain in order to complete household chores/cooking    Baseline 03/19/19 can stand about 227mins be41m needing to sit for a break 02/01/19 about 15mins bef91mneeding a break11/16/20 Can stand only stand for 15 mins d/t decreased tolerance; 04/26/19 Can stand for 327min to co56mte ADLs with increased pain    Time 8    Period Weeks    Status Partially Met      PT LONG TERM GOAL #8   Title Patient will demonstrate SLS of 10sec to demonstrate decreased fall risk    Baseline 04/26/19 RLE 7sec LLE 4sec 03/19/19 RLE 9.28 LLE 8.5 02/01/19 RLE 8.5sec  LLE 9sec 12/07/18 RLE 4sec LLE 4.2sec 11/19/18 RLE 4sec LLE 6sec    Time 8    Period Weeks    Status On-going      PT LONG TERM GOAL  #9   TITLE Patient will demonstrate 15 STS in 30sec to demonstrate age-matched increase in LE endurance    Baseline 04/26/19 14    Time 8    Period Weeks    Status New                 Plan - 09/03/19 1119    Clinical Impression Statement Patient continues to show decreased function without PT, but is motivated this session to complete therex for increased  extension. She takes increased time to achieve positioning for therex, but is able to commplete all with proper technique. Pt with some increased pain during session that is able to resolve with stretching. PT will continue progression as able.    Personal Factors and Comorbidities Age;Behavior Pattern;Fitness;Past/Current Experience;Comorbidity 1;Comorbidity 2;Comorbidity 3+    Comorbidities scolosis, DM1, chronic pain syndrome, HTN, afib, depression    Examination-Activity Limitations Bathing;Dressing;Transfers;Caring for Others;Carry;Squat;Lift;Bend;Bed Mobility    Examination-Participation Restrictions Yard Work;Laundry;Cleaning;Community Activity    Stability/Clinical Decision Making Evolving/Moderate complexity    Clinical Decision Making Moderate    Rehab Potential Fair    Clinical Impairments Affecting Rehab Potential comorbidities, symptom duration, curvature degree    PT Frequency 2x / week    PT Duration 8 weeks    PT Treatment/Interventions ADLs/Self Care Home Management;Aquatic Therapy;Cryotherapy;Ultrasound;Parrafin;Traction;Moist Heat;Electrical Stimulation;DME Instruction;Gait training;Stair training;Functional mobility training;Neuromuscular re-education;Balance training;Therapeutic exercise;Therapeutic activities;Patient/family education;Energy conservation;Spinal Manipulations;Joint Manipulations;Passive range of motion;Dry needling;Manual techniques    PT Next Visit Plan Progress Hip and core strengthening as tolerated    PT Home Exercise Plan Added standing pec stretch in doorway to HEP today    Consulted and Agree with Plan of Care Patient           Patient will benefit from skilled therapeutic intervention in order to improve the following deficits and impairments:  Abnormal gait, Decreased balance, Decreased endurance, Decreased mobility, Difficulty walking, Hypomobility, Increased muscle spasms, Decreased range of motion,  Pain, Postural dysfunction, Impaired flexibility,  Increased fascial restricitons, Decreased strength, Decreased activity tolerance  Visit Diagnosis: Other idiopathic scoliosis, lumbar region  Radiculopathy, lumbar region     Problem List Patient Active Problem List   Diagnosis Date Noted  . Central sleep apnea 10/09/2016  . Hypertension 10/09/2016  . Lumbar postlaminectomy syndrome 10/09/2016  . Lumbar radiculopathy 10/09/2016  . Diabetic polyneuropathy ([place]) 10/07/2016  . Right lumbosacral radiculopathy 10/07/2016  . Healthcare maintenance 08/26/2016  . DKA (diabetic ketoacidoses) ([place]) 06/14/2016  . Atypical chest pain 02/23/2016  . SOB (shortness of breath) 02/23/2016  . Pain medication agreement signed 01/13/2015  . Acquired hypothyroidism 05/30/2014  . Frontal sinusitis 12/17/2013  . Atrial fibrillation ([place]) 11/29/2013  . Major depression in remission ([place]) 11/20/2013  . 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 Chronic pain syndrome 08/27/2013  . Back pain 07/17/2013  . HTN (hypertension), benign 07/17/2013  . Hyperlipidemia 07/17/2013  . Pancreatic insufficiency 07/17/2013  . Chronic postoperative pain 01/01/2013  . Long term current use of opiate analgesic 11/04/2012  . Hypothyroidism 08/25/2012  . Radiculopathy of leg 05/01/2011   Durwin Reges DPT Durwin Reges 09/03/2019, 11:23 AM  [facility] [address], [place], [address] Phone: [phone]   Fax:  [phone]  Name: CLEOTA PELLERITO MRN: [id number] Date of Birth: 06/12/1950

## 2019-09-06 ENCOUNTER — Encounter: Payer: Medicare Other | Admitting: Physical Therapy

## 2019-09-10 ENCOUNTER — Encounter: Payer: Medicare Other | Admitting: Physical Therapy

## 2019-09-10 ENCOUNTER — Ambulatory Visit: Payer: Medicare Other | Admitting: Physical Therapy

## 2019-09-13 ENCOUNTER — Encounter: Payer: Medicare Other | Admitting: Physical Therapy

## 2019-09-17 ENCOUNTER — Encounter: Payer: Medicare Other | Admitting: Physical Therapy

## 2019-09-20 ENCOUNTER — Encounter: Payer: Medicare Other | Admitting: Physical Therapy

## 2019-09-24 ENCOUNTER — Encounter: Payer: Medicare Other | Admitting: Physical Therapy

## 2019-09-24 ENCOUNTER — Encounter: Payer: Self-pay | Admitting: Physical Therapy

## 2019-09-24 ENCOUNTER — Ambulatory Visit: Payer: Medicare Other | Attending: Anesthesiology | Admitting: Physical Therapy

## 2019-09-24 ENCOUNTER — Other Ambulatory Visit: Payer: Self-pay

## 2019-09-24 DIAGNOSIS — M5416 Radiculopathy, lumbar region: Secondary | ICD-10-CM | POA: Insufficient documentation

## 2019-09-24 DIAGNOSIS — M4126 Other idiopathic scoliosis, lumbar region: Secondary | ICD-10-CM

## 2019-09-24 NOTE — Therapy (Signed)
[facility] [address], [place], [address] Phone: [phone]   Fax:  [phone]  Physical Therapy Treatment  Patient Details  Name: Colleen Payne MRN: [id number] Date of Birth: Sep 14, 1950 No data recorded  Encounter Date: 09/24/2019   PT End of Session - 09/24/19 1049    Visit Number 16    Number of Visits 58    Date for PT Re-Evaluation 06/18/19    Authorization Type Medicare    Authorization Time Period 6/10    PT Start Time 1045    PT Stop Time 1120    PT Time Calculation (min) 35 min    Activity Tolerance Patient tolerated treatment well;No increased pain    Behavior During Therapy WFL for tasks assessed/performed           Past Medical History:  Diagnosis Date  . Anxiety   . Atrial fibrillation ([place])   . Depression   . Diabetes mellitus without complication (HCC)    Type I   . Headache    stress. 1x/month  . Hypertension   . Insulin pump in place   . Motion sickness    cars  . MVP (mitral valve prolapse)   . Neuromuscular disorder (HCC)    leg weakness s/p back surgeries  . Thyroid disease     Past Surgical History:  Procedure Laterality Date  . BACK SURGERY  2004   3 sugeries between Sept and Nov, fusion L2-S1  . CATARACT EXTRACTION W/PHACO Left 11/23/2014   Procedure: CATARACT EXTRACTION PHACO AND INTRAOCULAR LENS PLACEMENT (IOC);  Surgeon: Leandrew Koyanagi, MD;  Location: [facility];  Service: Ophthalmology;  Laterality: Left;  DIABETIC - insulin pump    There were no vitals filed for this visit.   Subjective Assessment - 09/24/19 1048    Subjective Patient reports 10/10 pain today, that she is continuing to have increased pain not being able to attend PT regularly.    Pertinent History Patient is 69 yo female that complains of lumbar pain and inability to walk erect, as well as hip/abdominal pain. Patient with history of lumbar fusion (L2-S1). Xray results  show: Fairly stable appearance of the levoscoliosis of the lower thoracic and of the lumbar spine.Stable lateral subluxation toward the left of L2 with respect L3. Stable partial compression of the body of L1. Patient complains of frustration with physicians, and that her symptoms are worsening over time. 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 Reported that her pain and difficulty worsened after trying to pick her husband up about 1 year ago. Patient mentioned that she has been told that she has "spinal fluid pockets that are leaking". PMH also includes DM I with insulin pump,  afib, HTN,  HLD, chronic pain syndrome, SOB.    Limitations Standing;Walking;House hold activities;Other (comment)    Diagnostic tests see exray results: Fairly stable appearance of the levoscoliosis of the lower thoracic and lumbar spine. Stable lateral subluxation toward left of L2 with respect of L3, stable partial compression of the body of L1.    Patient Stated Goals pain relief, standing erect    Pain Onset More than a month ago    Pain Onset More than a month ago           Ther-Ex -[place], simultaneous bilateral x46minutes -concurrent with above, BUE wand flexionx20 with 49min hold on last - Prone press up2x 12 with min cuing  for increased ROM, increased ROM throughout sets rest in prone prop 2x 11mn - Prone alt supermans 2x 12 with min cuing for increased ROM with good carry over Cat/Camel 2x 12 with cuing for breath control; childs pose 2x 145m hold - Theraball rollouts in sitting 10x 5sec hold with cuing for brath control with good carry over - Hip hike with CL hip swing x15 each LE; horizontal swing x15 each LE -Backwardwalking1.66m51mfor5mi36mcuing forposture and "large steps" with decent carry over. Pt able to maintain more thoracic ext with cuing to "keep eyes up"            PT Education - 09/24/19 1049    Education Details therex  form/technique    Person(s) Educated Patient    Methods Explanation;Demonstration;Verbal cues    Comprehension Verbalized understanding;Returned demonstration;Verbal cues required            PT Short Term Goals - 07/27/18 0933      PT SHORT TERM GOAL #1   Title Patient will be adherent to initial HEP at least 3x a week to improve functional strength and balance for better safety at home.    Time 4    Period Weeks    Status Achieved    Target Date 12/17/17             PT Long Term Goals - 04/26/19 1036      PT LONG TERM GOAL #1   Title Patient will be independent in bending down towards floor and picking up small object (<5 pounds) and then stand back up without increased pain to improve ability to pick up and clean up room at home    Baseline 09/25/18/20 able to pick up object without increase in pain, though she reports continued baseline pain, goal revised and achieved    Time 8    Period Weeks    Status Achieved      PT LONG TERM GOAL #2   Title Patient will increase BLE gross strength to 4+/5 as to improve functional strength for independent gait, increased standing tolerance and increased ADL ability.    Baseline 08/31/18 R/L flexion: 5/5 ER 5/5 IR 4+/4+ ABD 5/5 Ext 4+/4    Time 8    Period Weeks    Status Achieved      PT LONG TERM GOAL #3   Title Patient will report a worst pain of 5/10 with transfers of her husband to improve tolerance with ADLs     Baseline 02/01/19 7/10 pain with transfers in the LB, some increased L shoulder pain with transfers; 03/19/19 continued 7/10 pain with transfers; 04/26/19 6/10 pain over the past week with activity    Time 8    Period Weeks    Status On-going      PT LONG TERM GOAL #4   Title Pt will decrease 5TSTS to 10sec order to demonstrate clinically significant improvement in LE strength, and reduced fall risk    Baseline 04/26/19 10sec 03/19/19 10.6sec 02/01/19 10.76sec 12/07/18 17sec; 04/26/19    Time 8    Period Weeks    Status  Achieved      PT LONG TERM GOAL #5   Title Pt will increase 10MWT by at least 0.13 m/s in order to demonstrate clinically significant improvement in community ambulation    Baseline 02/01/19 m/s 09/25/18 0.83 07/31/18 0.57m/74m29/20 same 06/10/18 0.65m/s93mTime 8    Period Weeks    Status Achieved      Additional  Long Term Goals   Additional Long Term Goals Yes      PT LONG TERM GOAL #6   Title Patient will demonstrate walk speed of 1.2 to demonstrate community ambulation norm    Baseline 03/26/19 1.56m/s 03/19/19 1.110m/s 02/01/19 0.63m/s 12/07/18 0.67m/s 10/12/18 0.59m/s 09/25/18 0.84m/s    Time 8    Period Weeks    Status On-going      PT LONG TERM GOAL #7   Title Patient will increase standing tolerance to without increased pain in order to complete household chores/cooking    Baseline 03/19/19 can stand about before needing to sit for a break 02/01/19 about before needing a break11/16/20 Can stand only stand for 15 mins d/t decreased tolerance; 04/26/19 Can stand for to complete ADLs with increased pain    Time 8    Period Weeks    Status Partially Met      PT LONG TERM GOAL #8   Title Patient will demonstrate SLS of 10sec to demonstrate decreased fall risk    Baseline 04/26/19 RLE 7sec LLE 4sec 03/19/19 RLE 9.28 LLE 8.5 02/01/19 RLE 8.5sec  LLE 9sec 12/07/18 RLE 4sec LLE 4.2sec 11/19/18 RLE 4sec LLE 6sec    Time 8    Period Weeks    Status On-going      PT LONG TERM GOAL  #9   TITLE Patient will demonstrate 15 STS in 30sec to demonstrate age-matched increase in LE endurance    Baseline 04/26/19 14    Time 8    Period Weeks    Status New                 Plan - 09/24/19 1053    Clinical Impression Statement PT continued therex to increase mobility and upright posture with good success. Pt is motivated throughout sesison but limited by pain d/t decreased PT attendence (d/t insurance). Following session patient reports decreased pain fro m10/10 to 7/10. PT will  continue progression as able.    Personal Factors and Comorbidities Age;Behavior Pattern;Fitness;Past/Current Experience;Comorbidity 1;Comorbidity 2;Comorbidity 3+    Comorbidities scolosis, DM1, chronic pain syndrome, HTN, afib, depression    Examination-Activity Limitations Bathing;Dressing;Transfers;Caring for Others;Carry;Squat;Lift;Bend;Bed Mobility    Examination-Participation Restrictions Yard Work;Laundry;Cleaning;Community Activity    Stability/Clinical Decision Making Evolving/Moderate complexity    Clinical Decision Making Moderate    Rehab Potential Fair    Clinical Impairments Affecting Rehab Potential comorbidities, symptom duration, curvature degree    PT Frequency 2x / week    PT Duration 8 weeks    PT Treatment/Interventions ADLs/Self Care Home Management;Aquatic Therapy;Cryotherapy;Ultrasound;Parrafin;Traction;Moist Heat;Electrical Stimulation;DME Instruction;Gait training;Stair training;Functional mobility training;Neuromuscular re-education;Balance training;Therapeutic exercise;Therapeutic activities;Patient/family education;Energy conservation;Spinal Manipulations;Joint Manipulations;Passive range of motion;Dry needling;Manual techniques    PT Next Visit Plan Progress Hip and core strengthening as tolerated    PT Home Exercise Plan Added standing pec stretch in doorway to HEP today    Consulted and Agree with Plan of Care Patient           Patient will benefit from skilled therapeutic intervention in order to improve the following deficits and impairments:  Abnormal gait, Decreased balance, Decreased endurance, Decreased mobility, Difficulty walking, Hypomobility, Increased muscle spasms, Decreased range of motion, Pain, Postural dysfunction, Impaired flexibility, Increased fascial restricitons, Decreased strength, Decreased activity tolerance  Visit Diagnosis: Other idiopathic scoliosis, lumbar region  Radiculopathy, lumbar region     Problem List Patient Active  Problem List   Diagnosis Date Noted  . Central sleep apnea 10/09/2016  . Hypertension  10/09/2016  . Lumbar postlaminectomy syndrome 10/09/2016  . Lumbar radiculopathy 10/09/2016  . Diabetic polyneuropathy ([place]) 10/07/2016  . Right lumbosacral radiculopathy 10/07/2016  . Healthcare maintenance 08/26/2016  . DKA (diabetic ketoacidoses) ([place]) 06/14/2016  . Atypical chest pain 02/23/2016  . SOB (shortness of breath) 02/23/2016  . Pain medication agreement signed 01/13/2015  . Acquired hypothyroidism 05/30/2014  . Frontal sinusitis 12/17/2013  . Atrial fibrillation ([place]) 11/29/2013  . Major depression in remission ([place]) 11/20/2013  . 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 Chronic pain syndrome 08/27/2013  . Back pain 07/17/2013  . HTN (hypertension), benign 07/17/2013  . Hyperlipidemia 07/17/2013  . Pancreatic insufficiency 07/17/2013  . Chronic postoperative pain 01/01/2013  . Long term current use of opiate analgesic 11/04/2012  . Hypothyroidism 08/25/2012  . Radiculopathy of leg 05/01/2011   Durwin Reges DPT Durwin Reges 09/24/2019, 11:17 AM  [facility] [address], [place], [address] Phone: [phone]   Fax:  [phone]  Name: DEWAYNE JUREK MRN: [id number] Date of Birth: 1950-10-07

## 2019-10-01 ENCOUNTER — Encounter: Payer: Medicare Other | Admitting: Physical Therapy

## 2019-10-08 ENCOUNTER — Ambulatory Visit: Payer: Medicare Other | Admitting: Physical Therapy

## 2019-10-08 ENCOUNTER — Other Ambulatory Visit: Payer: Self-pay

## 2019-10-08 ENCOUNTER — Encounter: Payer: Self-pay | Admitting: Physical Therapy

## 2019-10-08 DIAGNOSIS — M4126 Other idiopathic scoliosis, lumbar region: Secondary | ICD-10-CM

## 2019-10-08 DIAGNOSIS — M5416 Radiculopathy, lumbar region: Secondary | ICD-10-CM

## 2019-10-08 NOTE — Therapy (Signed)
[facility] [address] [place], [address] Phone: [phone]   Fax:  [phone]  Physical Therapy Treatment  Patient Details  Name: Colleen Payne MRN: [id number] Date of Birth: 1950/12/26 No data recorded  Encounter Date: 10/08/2019    Past Medical History:  Diagnosis Date  . Anxiety   . Atrial fibrillation ([place])   . Depression   . Diabetes mellitus without complication (HCC)    Type I   . Headache    stress. 1x/month  . Hypertension   . Insulin pump in place   . Motion sickness    cars  . MVP (mitral valve prolapse)   . Neuromuscular disorder (HCC)    leg weakness s/p back surgeries  . Thyroid disease     Past Surgical History:  Procedure Laterality Date  . BACK SURGERY  2004   3 sugeries between Sept and Nov, fusion L2-S1  . CATARACT EXTRACTION W/PHACO Left 11/23/2014   Procedure: CATARACT EXTRACTION PHACO AND INTRAOCULAR LENS PLACEMENT (IOC);  Surgeon: Leandrew Koyanagi, MD;  Location: [facility];  Service: Ophthalmology;  Laterality: Left;  DIABETIC - insulin pump    There were no vitals filed for this visit.   Ther-Ex -Thomas Test Stretch, simultaneous bilateralx108mnutes -concurrent with above, BUE wand flexionx20 with 156m hold on last - Prone press up2x 12 with min cuing for increased ROM, increased ROM throughout setsrest in prone prop 2x 109m74m- BirdDog 3x 12 with min stabilization needed - Theraball rollouts in sitting 10x 5sec hold with cuing for brath control with good carry over - Hip hike with CL hip swing x15 each LE; horizontal swing x15 each LE -Backwardwalking1.0mp1109mor5min47muing forposture and "large steps" with decent carry over. Pt able to maintain more thoracic ext with cuing to "keep eyes up"                              PT Short Term Goals - 07/27/18 0933      PT SHORT TERM GOAL #1   Title Patient will be  adherent to initial HEP at least 3x a week to improve functional strength and balance for better safety at home.    Time 4    Period Weeks    Status Achieved    Target Date 12/17/17             PT Long Term Goals - 04/26/19 1036      PT LONG TERM GOAL #1   Title Patient will be independent in bending down towards floor and picking up small object (<5 pounds) and then stand back up without increased pain to improve ability to pick up and clean up room at home    Baseline 09/25/18/20 able to pick up object without increase in pain, though she reports continued baseline pain, goal revised and achieved    Time 8    Period Weeks    Status Achieved      PT LONG TERM GOAL #2   Title Patient will increase BLE gross strength to 4+/5 as to improve functional strength for independent gait, increased standing tolerance and increased ADL ability.    Baseline 08/31/18 R/L flexion: 5/5 ER 5/5 IR 4+/4+ ABD 5/5 Ext 4+/4    Time 8    Period Weeks    Status Achieved      PT LONG TERM GOAL #3   Title Patient will report a  worst pain of 5/10 with transfers of her husband to improve tolerance with ADLs     Baseline 02/01/19 7/10 pain with transfers in the LB, some increased L shoulder pain with transfers; 03/19/19 continued 7/10 pain with transfers; 04/26/19 6/10 pain over the past week with activity    Time 8    Period Weeks    Status On-going      PT LONG TERM GOAL #4   Title Pt will decrease 5TSTS to 10sec order to demonstrate clinically significant improvement in LE strength, and reduced fall risk    Baseline 04/26/19 10sec 03/19/19 10.6sec 02/01/19 10.76sec 12/07/18 17sec; 04/26/19    Time 8    Period Weeks    Status Achieved      PT LONG TERM GOAL #5   Title Pt will increase 10MWT by at least 0.13 m/s in order to demonstrate clinically significant improvement in community ambulation    Baseline 02/01/19 m/s 09/25/18 0.83 07/31/18 0.71ms 07/20/18 same 06/10/18 0.775m    Time 8    Period Weeks    Status  Achieved      Additional Long Term Goals   Additional Long Term Goals Yes      PT LONG TERM GOAL #6   Title Patient will demonstrate walk speed of 1.2 to demonstrate community ambulation norm    Baseline 03/26/19 1.86m65m2/26/21 1.86m/486m/11/21 0.986m/34m/16/20 0.71m/s66m1/20 0.8m/s 985m20 0.16m/s  26mme 8    Period Weeks    Status On-going      PT LONG TERM GOAL #7   Title Patient will increase standing tolerance to 386min wi55mt increased pain in order to complete household chores/cooking    Baseline 03/19/19 can stand about 286mins be47m needing to sit for a break 02/01/19 about 15mins bef37mneeding a break11/16/20 Can stand only stand for 15 mins d/t decreased tolerance; 04/26/19 Can stand for 386min to co23mte ADLs with increased pain    Time 8    Period Weeks    Status Partially Met      PT LONG TERM GOAL #8   Title Patient will demonstrate SLS of 10sec to demonstrate decreased fall risk    Baseline 04/26/19 RLE 7sec LLE 4sec 03/19/19 RLE 9.28 LLE 8.5 02/01/19 RLE 8.5sec  LLE 9sec 12/07/18 RLE 4sec LLE 4.2sec 11/19/18 RLE 4sec LLE 6sec    Time 8    Period Weeks    Status On-going      PT LONG TERM GOAL  #9   TITLE Patient will demonstrate 15 STS in 30sec to demonstrate age-matched increase in LE endurance    Baseline 04/26/19 14    Time 8    Period Weeks    Status New                  Patient will benefit from skilled therapeutic intervention in order to improve the following deficits and impairments:     Visit Diagnosis: No diagnosis found.     Problem List Patient Active Problem List   Diagnosis Date Noted  . Central sleep apnea 10/09/2016  . Hypertension 10/09/2016  . Lumbar postlaminectomy syndrome 10/09/2016  . Lumbar radiculopathy 10/09/2016  . Diabetic polyneuropathy (HCC) 09/17/2[place]  . Right lumbosacral radiculopathy 10/07/2016  . Healthcare maintenance 08/26/2016  . DKA (diabetic ketoacidoses) (HCC) 05/25/2[place]  . Atypical chest pain 02/23/2016  .  SOB (shortness of breath) 02/23/2016  . Pain medication agreement signed 01/13/2015  . Acquired hypothyroidism 05/30/2014  . Frontal sinusitis 12/17/2013  .  Atrial fibrillation ([place]) 11/29/2013  . Major depression in remission ([place]) 11/20/2013  . 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 Chronic pain syndrome 08/27/2013  . Back pain 07/17/2013  . HTN (hypertension), benign 07/17/2013  . Hyperlipidemia 07/17/2013  . Pancreatic insufficiency 07/17/2013  . Chronic postoperative pain 01/01/2013  . Long term current use of opiate analgesic 11/04/2012  . Hypothyroidism 08/25/2012  . Radiculopathy of leg 05/01/2011   Durwin Reges DPT Durwin Reges 10/08/2019, 10:27 AM  [facility] [address], [place], [address] Phone: [phone]   Fax:  [phone]  Name: Colleen Payne MRN: [id number] Date of Birth: 1950/03/19

## 2019-10-22 ENCOUNTER — Ambulatory Visit: Payer: Medicare Other | Attending: Anesthesiology

## 2019-10-22 ENCOUNTER — Other Ambulatory Visit: Payer: Self-pay

## 2019-10-22 DIAGNOSIS — M4126 Other idiopathic scoliosis, lumbar region: Secondary | ICD-10-CM

## 2019-10-22 DIAGNOSIS — M5416 Radiculopathy, lumbar region: Secondary | ICD-10-CM

## 2019-10-22 NOTE — Therapy (Signed)
[facility] [address], [place], [address] Phone: [phone]   Fax:  [phone]  Physical Therapy Screening  Patient Details  Name: Colleen Payne MRN: [id number] Date of Birth: 12-29-1950 No data recorded  Encounter Date: 10/22/2019   PT End of Session - 10/22/19 1129    PT Start Time 1030    PT Stop Time 1105    PT Time Calculation (min) 35 min    Activity Tolerance Patient tolerated treatment well;No increased pain    Behavior During Therapy WFL for tasks assessed/performed           Past Medical History:  Diagnosis Date  . Anxiety   . Atrial fibrillation ([place])   . Depression   . Diabetes mellitus without complication (HCC)    Type I   . Headache    stress. 1x/month  . Hypertension   . Insulin pump in place   . Motion sickness    cars  . MVP (mitral valve prolapse)   . Neuromuscular disorder (HCC)    leg weakness s/p back surgeries  . Thyroid disease     Past Surgical History:  Procedure Laterality Date  . BACK SURGERY  2004   3 sugeries between Sept and Nov, fusion L2-S1  . CATARACT EXTRACTION W/PHACO Left 11/23/2014   Procedure: CATARACT EXTRACTION PHACO AND INTRAOCULAR LENS PLACEMENT (IOC);  Surgeon: Leandrew Koyanagi, MD;  Location: [facility];  Service: Ophthalmology;  Laterality: Left;  DIABETIC - insulin pump    There were no vitals filed for this visit.   Subjective Assessment - 10/22/19 1128    Subjective Pt arrives for physical therapy screening to assess progresses since DC. Pt has been working toward HEP as able, feel like she isn't making any progress.          HEP education and review   -Hooklying wand flexion x20 c PVC -Hooklying wand flexion x10 c silver physioball  -Bridge x15 -half kneeling hip extension stretch 2x30sec bilat, BUE support on each side (kneeling on airex to avoid knee irritation; subjectively has more quads stretch than  psoas) -Quadruped Bird Dog 1x16, alternating sides, intermittent MinA for balance -Standing with back to wall, trunk extension to wall 1x15x3secH (able to get close to fully upright for short periods)  -backward walking 13ft c SPC, minGuard assist for safety       Plan - 10/22/19 1130    Clinical Impression Statement HEP education reviewed and updated as needed. Pt encouraged to improve compliance when able. Pt given encouragement about objective progress being made, despite continued difficulty with pain control.           Patient will benefit from skilled therapeutic intervention in order to improve the following deficits and impairments:     Visit Diagnosis: Other idiopathic scoliosis, lumbar region  Radiculopathy, lumbar region     Problem List Patient Active Problem List   Diagnosis Date Noted  . Central sleep apnea 10/09/2016  . Hypertension 10/09/2016  . Lumbar postlaminectomy syndrome 10/09/2016  . Lumbar radiculopathy 10/09/2016  . Diabetic polyneuropathy ([place]) 10/07/2016  . Right lumbosacral radiculopathy 10/07/2016  . Healthcare maintenance 08/26/2016  . DKA (diabetic ketoacidoses) 06/14/2016  . Atypical chest pain 02/23/2016  . SOB (shortness of breath) 02/23/2016  . Pain medication agreement signed 01/13/2015  . Acquired hypothyroidism 05/30/2014  . Frontal sinusitis 12/17/2013  . Atrial fibrillation ([place]) 11/29/2013  . Major depression in remission ([place]) 11/20/2013  . Type 1  diabetes mellitus with stage 2 chronic kidney disease ([place]) 10/26/2013  . Chronic pain syndrome 08/27/2013  . Back pain 07/17/2013  . HTN (hypertension), benign 07/17/2013  . Hyperlipidemia 07/17/2013  . Pancreatic insufficiency 07/17/2013  . Chronic postoperative pain 01/01/2013  . Long term current use of opiate analgesic 11/04/2012  . Hypothyroidism 08/25/2012  . Radiculopathy of leg 05/01/2011   11:33 AM, 10/22/19 Etta Grandchild, PT, DPT Physical Therapist - [facility] [phone] (Office)    Everet Flagg C 10/22/2019, 11:32 AM  [facility] [address], [place], [address] Phone: [phone]   Fax:  [phone]  Name: KANCHAN GAL MRN: [id number] Date of Birth: [birth year]

## 2019-10-29 ENCOUNTER — Ambulatory Visit: Payer: Medicare Other | Admitting: Physical Therapy

## 2019-11-05 ENCOUNTER — Ambulatory Visit: Payer: Medicare Other | Admitting: Physical Therapy

## 2019-11-08 ENCOUNTER — Ambulatory Visit: Payer: Medicare Other | Admitting: Physical Therapy

## 2019-11-12 ENCOUNTER — Ambulatory Visit: Payer: Medicare Other | Admitting: Physical Therapy

## 2019-11-12 ENCOUNTER — Other Ambulatory Visit: Payer: Self-pay

## 2019-11-12 DIAGNOSIS — M5416 Radiculopathy, lumbar region: Secondary | ICD-10-CM

## 2019-11-12 DIAGNOSIS — M4126 Other idiopathic scoliosis, lumbar region: Secondary | ICD-10-CM

## 2019-11-12 NOTE — Therapy (Signed)
[facility] [address], [place], [address] Phone: [phone]   Fax:  [phone]  Physical Therapy Screening  Patient Details  Name: Colleen Payne MRN: [id number] Date of Birth: Feb 25, 1950 No data recorded  Encounter Date: 11/12/2019   PT End of Session - 11/12/19 1107    Visit Number 2    Number of Visits 92    Date for PT Re-Evaluation 06/18/19    PT Start Time 7408    PT Stop Time 1115    PT Time Calculation (min) 30 min    Activity Tolerance Patient tolerated treatment well;No increased pain    Behavior During Therapy WFL for tasks assessed/performed           Past Medical History:  Diagnosis Date  . Anxiety   . Atrial fibrillation ([place])   . Depression   . Diabetes mellitus without complication (HCC)    Type I   . Headache    stress. 1x/month  . Hypertension   . Insulin pump in place   . Motion sickness    cars  . MVP (mitral valve prolapse)   . Neuromuscular disorder (HCC)    leg weakness s/p back surgeries  . Thyroid disease     Past Surgical History:  Procedure Laterality Date  . BACK SURGERY  2004   3 sugeries between Sept and Nov, fusion L2-S1  . CATARACT EXTRACTION W/PHACO Left 11/23/2014   Procedure: CATARACT EXTRACTION PHACO AND INTRAOCULAR LENS PLACEMENT (IOC);  Surgeon: Leandrew Koyanagi, MD;  Location: [facility];  Service: Ophthalmology;  Laterality: Left;  DIABETIC - insulin pump    There were no vitals filed for this visit.   Subjective Assessment - 11/12/19 1105    Subjective Arrives to screen for maintenance after d/c d/t insurance. Is completing HEP as able, but is making less progress than with regular PT.    Pertinent History Patient is 69 yo female that complains of lumbar pain and inability to walk erect, as well as hip/abdominal pain. Patient with history of lumbar fusion (L2-S1). Xray results show: Fairly stable appearance of the levoscoliosis  of the lower thoracic and of the lumbar spine.Stable lateral subluxation toward the left of L2 with respect L3. Stable partial compression of the body of L1. Patient complains of frustration with physicians, and that her symptoms are worsening over time. 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 Reported that her pain and difficulty worsened after trying to pick her husband up about 1 year ago. Patient mentioned that she has been told that she has "spinal fluid pockets that are leaking". PMH also includes DM I with insulin pump,  afib, HTN,  HLD, chronic pain syndrome, SOB.    Limitations Standing;Walking;House hold activities;Other (comment)    Diagnostic tests see exray results: Fairly stable appearance of the levoscoliosis of the lower thoracic and lumbar spine. Stable lateral subluxation toward left of L2 with respect of L3, stable partial compression of the body of L1.    Patient Stated Goals pain relief, standing erect    Pain Onset More than a month ago    Pain Onset More than a month ago           HEP education and review   -Hooklying wand flexion x20 c PVC -Hooklying wand flexion x10 c silver physioball  -Quadruped Bird Dog 2x 12 intermittent CGA for safety; childs pose x66mn between sets -Standing with  back to wall, trunk extension to wall 2x 12/15x3secH (able to get close to fully upright for short periods) standing rest between sets -Backwardwalking1.57mh for582ms,cuing forposture and "large steps" with decent carry over. Pt able to maintain more thoracic ext with cuing to "keep eyes up"              PT Education - 11/12/19 1106    Education Details therex form    Person(s) Educated Patient    Methods Explanation;Demonstration;Verbal cues    Comprehension Verbalized understanding;Returned demonstration;Verbal cues required            PT Short Term Goals - 07/27/18 0933      PT SHORT TERM GOAL #1   Title Patient will  be adherent to initial HEP at least 3x a week to improve functional strength and balance for better safety at home.    Time 4    Period Weeks    Status Achieved    Target Date 12/17/17             PT Long Term Goals - 04/26/19 1036      PT LONG TERM GOAL #1   Title Patient will be independent in bending down towards floor and picking up small object (<5 pounds) and then stand back up without increased pain to improve ability to pick up and clean up room at home    Baseline 09/25/18/20 able to pick up object without increase in pain, though she reports continued baseline pain, goal revised and achieved    Time 8    Period Weeks    Status Achieved      PT LONG TERM GOAL #2   Title Patient will increase BLE gross strength to 4+/5 as to improve functional strength for independent gait, increased standing tolerance and increased ADL ability.    Baseline 08/31/18 R/L flexion: 5/5 ER 5/5 IR 4+/4+ ABD 5/5 Ext 4+/4    Time 8    Period Weeks    Status Achieved      PT LONG TERM GOAL #3   Title Patient will report a worst pain of 5/10 with transfers of her husband to improve tolerance with ADLs     Baseline 02/01/19 7/10 pain with transfers in the LB, some increased L shoulder pain with transfers; 03/19/19 continued 7/10 pain with transfers; 04/26/19 6/10 pain over the past week with activity    Time 8    Period Weeks    Status On-going      PT LONG TERM GOAL #4   Title Pt will decrease 5TSTS to 10sec order to demonstrate clinically significant improvement in LE strength, and reduced fall risk    Baseline 04/26/19 10sec 03/19/19 10.6sec 02/01/19 10.76sec 12/07/18 17sec; 04/26/19    Time 8    Period Weeks    Status Achieved      PT LONG TERM GOAL #5   Title Pt will increase 10MWT by at least 0.13 m/s in order to demonstrate clinically significant improvement in community ambulation    Baseline 02/01/19 m/s 09/25/18 0.83 07/31/18 0.7736m6/29/20 same 06/10/18 0.41m22m  Time 8    Period Weeks     Status Achieved      Additional Long Term Goals   Additional Long Term Goals Yes      PT LONG TERM GOAL #6   Title Patient will demonstrate walk speed of 1.2 to demonstrate community ambulation norm    Baseline 03/26/19 1.2m/s62m26/21 1.2m/s 84m1/21 0.92m/s 62m6/20 0.67m/s 979m20 0.28m/s53m  09/25/18 0.34m/s    Time 8    Period Weeks    Status On-going      PT LONG TERM GOAL #7   Title Patient will increase standing tolerance to 36min without increased pain in order to complete household chores/cooking    Baseline 03/19/19 can stand about 23mins before needing to sit for a break 02/01/19 about 83mins before needing a break11/16/20 Can stand only stand for 15 mins d/t decreased tolerance; 04/26/19 Can stand for 17min to complete ADLs with increased pain    Time 8    Period Weeks    Status Partially Met      PT LONG TERM GOAL #8   Title Patient will demonstrate SLS of 10sec to demonstrate decreased fall risk    Baseline 04/26/19 RLE 7sec LLE 4sec 03/19/19 RLE 9.28 LLE 8.5 02/01/19 RLE 8.5sec  LLE 9sec 12/07/18 RLE 4sec LLE 4.2sec 11/19/18 RLE 4sec LLE 6sec    Time 8    Period Weeks    Status On-going      PT LONG TERM GOAL  #9   TITLE Patient will demonstrate 15 STS in 30sec to demonstrate age-matched increase in LE endurance    Baseline 04/26/19 14    Time 8    Period Weeks    Status New                 Plan - 11/12/19 1111    Clinical Impression Statement PT continued HEP review and encouragement. Patient is able to comply with all cuing for proper technique of all therex with good motivation throughout session. PT will continue progression as able.    Personal Factors and Comorbidities Age;Behavior Pattern;Fitness;Past/Current Experience;Comorbidity 1;Comorbidity 2;Comorbidity 3+    Comorbidities scolosis, DM1, chronic pain syndrome, HTN, afib, depression    Examination-Activity Limitations Bathing;Dressing;Transfers;Caring for Others;Carry;Squat;Lift;Bend;Bed Mobility     Examination-Participation Restrictions Yard Work;Laundry;Cleaning;Community Activity    Stability/Clinical Decision Making Evolving/Moderate complexity    Clinical Decision Making Moderate    Rehab Potential Fair    Clinical Impairments Affecting Rehab Potential comorbidities, symptom duration, curvature degree    PT Frequency 2x / week    PT Duration 8 weeks    PT Treatment/Interventions ADLs/Self Care Home Management;Aquatic Therapy;Cryotherapy;Ultrasound;Parrafin;Traction;Moist Heat;Electrical Stimulation;DME Instruction;Gait training;Stair training;Functional mobility training;Neuromuscular re-education;Balance training;Therapeutic exercise;Therapeutic activities;Patient/family education;Energy conservation;Spinal Manipulations;Joint Manipulations;Passive range of motion;Dry needling;Manual techniques    PT Next Visit Plan Progress Hip and core strengthening as tolerated    PT Home Exercise Plan Added standing pec stretch in doorway to HEP today    Consulted and Agree with Plan of Care Patient           Patient will benefit from skilled therapeutic intervention in order to improve the following deficits and impairments:  Abnormal gait, Decreased balance, Decreased endurance, Decreased mobility, Difficulty walking, Hypomobility, Increased muscle spasms, Decreased range of motion, Pain, Postural dysfunction, Impaired flexibility, Increased fascial restricitons, Decreased strength, Decreased activity tolerance  Visit Diagnosis: Other idiopathic scoliosis, lumbar region  Radiculopathy, lumbar region     Problem List Patient Active Problem List   Diagnosis Date Noted  . Central sleep apnea 10/09/2016  . Hypertension 10/09/2016  . Lumbar postlaminectomy syndrome 10/09/2016  . Lumbar radiculopathy 10/09/2016  . Diabetic polyneuropathy ([place]) 10/07/2016  . Right lumbosacral radiculopathy 10/07/2016  . Healthcare maintenance 08/26/2016  . DKA (diabetic ketoacidoses) 06/14/2016  .  Atypical chest pain 02/23/2016  . SOB (shortness of breath) 02/23/2016  . Pain medication agreement signed 01/13/2015  . Acquired hypothyroidism 05/30/2014  .  Frontal sinusitis 12/17/2013  . Atrial fibrillation ([place]) 11/29/2013  . Major depression in remission ([place]) 11/20/2013  . 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 Chronic pain syndrome 08/27/2013  . Back pain 07/17/2013  . HTN (hypertension), benign 07/17/2013  . Hyperlipidemia 07/17/2013  . Pancreatic insufficiency 07/17/2013  . Chronic postoperative pain 01/01/2013  . Long term current use of opiate analgesic 11/04/2012  . Hypothyroidism 08/25/2012  . Radiculopathy of leg 05/01/2011   Durwin Reges DPT Durwin Reges 11/12/2019, 11:13 AM  [facility] [address], [place], [address] Phone: [phone]   Fax:  [phone]  Name: Colleen Payne MRN: [id number] Date of Birth: 11/23/1950

## 2019-11-15 ENCOUNTER — Encounter: Payer: Medicare Other | Admitting: Physical Therapy

## 2019-11-19 ENCOUNTER — Ambulatory Visit: Payer: Medicare Other | Admitting: Physical Therapy

## 2019-11-22 ENCOUNTER — Encounter: Payer: Medicare Other | Admitting: Physical Therapy

## 2019-11-26 ENCOUNTER — Other Ambulatory Visit: Payer: Self-pay

## 2019-11-26 ENCOUNTER — Ambulatory Visit: Payer: Medicare Other | Attending: Anesthesiology | Admitting: Physical Therapy

## 2019-11-26 ENCOUNTER — Encounter: Payer: Self-pay | Admitting: Physical Therapy

## 2019-11-26 DIAGNOSIS — M4126 Other idiopathic scoliosis, lumbar region: Secondary | ICD-10-CM

## 2019-11-26 DIAGNOSIS — M5416 Radiculopathy, lumbar region: Secondary | ICD-10-CM

## 2019-11-26 NOTE — Therapy (Signed)
[facility] [address] [place], [address] Phone: [phone]   Fax:  [phone]  Physical Therapy Screen  Patient Details  Name: Colleen Payne MRN: [id number] Date of Birth: 11-Aug-1950 No data recorded  Encounter Date: 11/26/2019   PT End of Session - 11/26/19 1102    Visit Number 3    Number of Visits 92    Date for PT Re-Evaluation 06/18/19    Authorization Type Medicare    Authorization Time Period 6/10    PT Start Time 1045    PT Stop Time 1125    PT Time Calculation (min) 40 min    Equipment Utilized During Treatment Other (comment)    Activity Tolerance Patient tolerated treatment well;No increased pain    Behavior During Therapy WFL for tasks assessed/performed           Past Medical History:  Diagnosis Date  . Anxiety   . Atrial fibrillation ([place])   . Depression   . Diabetes mellitus without complication (HCC)    Type I   . Headache    stress. 1x/month  . Hypertension   . Insulin pump in place   . Motion sickness    cars  . MVP (mitral valve prolapse)   . Neuromuscular disorder (HCC)    leg weakness s/p back surgeries  . Thyroid disease     Past Surgical History:  Procedure Laterality Date  . BACK SURGERY  2004   3 sugeries between Sept and Nov, fusion L2-S1  . CATARACT EXTRACTION W/PHACO Left 11/23/2014   Procedure: CATARACT EXTRACTION PHACO AND INTRAOCULAR LENS PLACEMENT (IOC);  Surgeon: Leandrew Koyanagi, MD;  Location: [facility];  Service: Ophthalmology;  Laterality: Left;  DIABETIC - insulin pump    There were no vitals filed for this visit.   Subjective Assessment - 11/26/19 1051    Subjective Arrives to screen for maintenance after d/c d/t insurance. Is completing HEP as able, but is making less progress than with regular PT.    Pertinent History Patient is 69 yo female that complains of lumbar pain and inability to walk erect, as well as hip/abdominal  pain. Patient with history of lumbar fusion (L2-S1). Xray results show: Fairly stable appearance of the levoscoliosis of the lower thoracic and of the lumbar spine.Stable lateral subluxation toward the left of L2 with respect L3. Stable partial compression of the body of L1. Patient complains of frustration with physicians, and that her symptoms are worsening over time. 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 Reported that her pain and difficulty worsened after trying to pick her husband up about 1 year ago. Patient mentioned that she has been told that she has "spinal fluid pockets that are leaking". PMH also includes DM I with insulin pump,  afib, HTN,  HLD, chronic pain syndrome, SOB.    Limitations Standing;Walking;House hold activities;Other (comment)    Diagnostic tests see exray results: Fairly stable appearance of the levoscoliosis of the lower thoracic and lumbar spine. Stable lateral subluxation toward left of L2 with respect of L3, stable partial compression of the body of L1.    Patient Stated Goals pain relief, standing erect    Pain Onset More than a month ago    Pain Onset More than a month ago              HEP education and review -Hooklying wand flexion x20 c PVC -  Prone press up x15  childs pose 79mn -Quadruped Bird Dog 2x 12 intermittent CGA for safety; childs pose x146m between sets - Good mornings with PVC pipe 2x 10 with min cuing for full available ext and BUE flex with good carry over -Standing with back to wall, trunk extension to wall 2x 15x3secH (able to get close to fully upright for short periods) standing rest between sets; PT blocking R knee -Backwardwalking1.10m810mfor5mi61mcuing forposture and "large steps" with decent carry over. Pt able to maintain more thoracic ext with cuing to "keep eyes up"                        PT Education - 11/26/19 1101    Education Details therex form/HEP review     Person(s) Educated Patient    Methods Explanation;Demonstration;Tactile cues;Verbal cues    Comprehension Verbalized understanding;Returned demonstration;Verbal cues required;Tactile cues required            PT Short Term Goals - 07/27/18 0933      PT SHORT TERM GOAL #1   Title Patient will be adherent to initial HEP at least 3x a week to improve functional strength and balance for better safety at home.    Time 4    Period Weeks    Status Achieved    Target Date 12/17/17             PT Long Term Goals - 04/26/19 1036      PT LONG TERM GOAL #1   Title Patient will be independent in bending down towards floor and picking up small object (<5 pounds) and then stand back up without increased pain to improve ability to pick up and clean up room at home    Baseline 09/25/18/20 able to pick up object without increase in pain, though she reports continued baseline pain, goal revised and achieved    Time 8    Period Weeks    Status Achieved      PT LONG TERM GOAL #2   Title Patient will increase BLE gross strength to 4+/5 as to improve functional strength for independent gait, increased standing tolerance and increased ADL ability.    Baseline 08/31/18 R/L flexion: 5/5 ER 5/5 IR 4+/4+ ABD 5/5 Ext 4+/4    Time 8    Period Weeks    Status Achieved      PT LONG TERM GOAL #3   Title Patient will report a worst pain of 5/10 with transfers of her husband to improve tolerance with ADLs     Baseline 02/01/19 7/10 pain with transfers in the LB, some increased L shoulder pain with transfers; 03/19/19 continued 7/10 pain with transfers; 04/26/19 6/10 pain over the past week with activity    Time 8    Period Weeks    Status On-going      PT LONG TERM GOAL #4   Title Pt will decrease 5TSTS to 10sec order to demonstrate clinically significant improvement in LE strength, and reduced fall risk    Baseline 04/26/19 10sec 03/19/19 10.6sec 02/01/19 10.76sec 12/07/18 17sec; 04/26/19    Time 8    Period  Weeks    Status Achieved      PT LONG TERM GOAL #5   Title Pt will increase 10MWT by at least 0.13 m/s in order to demonstrate clinically significant improvement in community ambulation    Baseline 02/01/19 m/s 09/25/18 0.83 07/31/18 0.9m/34m29/20 same 06/10/18 0.12m/s4mTime 8  Period Weeks    Status Achieved      Additional Long Term Goals   Additional Long Term Goals Yes      PT LONG TERM GOAL #6   Title Patient will demonstrate walk speed of 1.2 to demonstrate community ambulation norm    Baseline 03/26/19 1.1ms 03/19/19 1.047m 02/01/19 0.9045m11/16/20 0.73m35m/21/20 0.59m/s21m4/20 0.64m/s46mTime 8    Period Weeks    Status On-going      PT LONG TERM GOAL #7   Title Patient will increase standing tolerance to 30min 49mout increased pain in order to complete household chores/cooking    Baseline 03/19/19 can stand about 20mins 37mre needing to sit for a break 02/01/19 about 15mins b54me needing a break11/16/20 Can stand only stand for 15 mins d/t decreased tolerance; 04/26/19 Can stand for 30min to 11mlete ADLs with increased pain    Time 8    Period Weeks    Status Partially Met      PT LONG TERM GOAL #8   Title Patient will demonstrate SLS of 10sec to demonstrate decreased fall risk    Baseline 04/26/19 RLE 7sec LLE 4sec 03/19/19 RLE 9.28 LLE 8.5 02/01/19 RLE 8.5sec  LLE 9sec 12/07/18 RLE 4sec LLE 4.2sec 11/19/18 RLE 4sec LLE 6sec    Time 8    Period Weeks    Status On-going      PT LONG TERM GOAL  #9   TITLE Patient will demonstrate 15 STS in 30sec to demonstrate age-matched increase in LE endurance    Baseline 04/26/19 14    Time 8    Period Weeks    Status New                 Plan - 11/26/19 1103    Clinical Impression Statement PT continued HEP review and therex for maintenance between insurance lapse. Patient is motivated throughout therex with decreased pain following. Patient with new insurance authorization, will be re-evaluated next session    Personal  Factors and Comorbidities Age;Behavior Pattern;Fitness;Past/Current Experience;Comorbidity 1;Comorbidity 2;Comorbidity 3+    Comorbidities scolosis, DM1, chronic pain syndrome, HTN, afib, depression    Examination-Activity Limitations Bathing;Dressing;Transfers;Caring for Others;Carry;Squat;Lift;Bend;Bed Mobility    Examination-Participation Restrictions Yard Work;Laundry;Cleaning;Community Activity    Stability/Clinical Decision Making Evolving/Moderate complexity    Clinical Decision Making Moderate    Rehab Potential Fair    Clinical Impairments Affecting Rehab Potential comorbidities, symptom duration, curvature degree    PT Frequency 2x / week    PT Duration 8 weeks    PT Treatment/Interventions ADLs/Self Care Home Management;Aquatic Therapy;Cryotherapy;Ultrasound;Parrafin;Traction;Moist Heat;Electrical Stimulation;DME Instruction;Gait training;Stair training;Functional mobility training;Neuromuscular re-education;Balance training;Therapeutic exercise;Therapeutic activities;Patient/family education;Energy conservation;Spinal Manipulations;Joint Manipulations;Passive range of motion;Dry needling;Manual techniques    PT Next Visit Plan REEVAL    PT Home Exercise Plan Added standing pec stretch in doorway to HEP today    Consulted and Agree with Plan of Care Patient           Patient will benefit from skilled therapeutic intervention in order to improve the following deficits and impairments:  Abnormal gait, Decreased balance, Decreased endurance, Decreased mobility, Difficulty walking, Hypomobility, Increased muscle spasms, Decreased range of motion, Pain, Postural dysfunction, Impaired flexibility, Increased fascial restricitons, Decreased strength, Decreased activity tolerance  Visit Diagnosis: Other idiopathic scoliosis, lumbar region  Radiculopathy, lumbar region     Problem List Patient Active Problem List   Diagnosis Date Noted  . Central sleep apnea 10/09/2016  .  Hypertension 10/09/2016  . Lumbar postlaminectomy syndrome  10/09/2016  . Lumbar radiculopathy 10/09/2016  . Diabetic polyneuropathy ([place]) 10/07/2016  . Right lumbosacral radiculopathy 10/07/2016  . Healthcare maintenance 08/26/2016  . DKA (diabetic ketoacidoses) 06/14/2016  . Atypical chest pain 02/23/2016  . SOB (shortness of breath) 02/23/2016  . Pain medication agreement signed 01/13/2015  . Acquired hypothyroidism 05/30/2014  . Frontal sinusitis 12/17/2013  . Atrial fibrillation ([place]) 11/29/2013  . Major depression in remission ([place]) 11/20/2013  . 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 Chronic pain syndrome 08/27/2013  . Back pain 07/17/2013  . HTN (hypertension), benign 07/17/2013  . Hyperlipidemia 07/17/2013  . Pancreatic insufficiency 07/17/2013  . Chronic postoperative pain 01/01/2013  . Long term current use of opiate analgesic 11/04/2012  . Hypothyroidism 08/25/2012  . Radiculopathy of leg 05/01/2011   Durwin Reges DPT Durwin Reges 11/26/2019, 11:26 AM  [facility] [address], [place], [address] Phone: [phone]   Fax:  [phone]  Name: Colleen Payne MRN: [id number] Date of Birth: 28-May-1950

## 2019-11-29 ENCOUNTER — Encounter: Payer: Medicare Other | Admitting: Physical Therapy

## 2019-12-08 ENCOUNTER — Encounter: Payer: Medicare Other | Admitting: Physical Therapy

## 2019-12-10 ENCOUNTER — Encounter: Payer: Self-pay | Admitting: Physical Therapy

## 2019-12-10 ENCOUNTER — Ambulatory Visit: Payer: Medicare Other | Admitting: Physical Therapy

## 2019-12-10 ENCOUNTER — Other Ambulatory Visit: Payer: Self-pay

## 2019-12-10 DIAGNOSIS — M5416 Radiculopathy, lumbar region: Secondary | ICD-10-CM

## 2019-12-10 DIAGNOSIS — M4126 Other idiopathic scoliosis, lumbar region: Secondary | ICD-10-CM | POA: Diagnosis present

## 2019-12-10 NOTE — Therapy (Signed)
[facility] [address] [place], [address] Phone: [phone]   Fax:  [phone]  Physical Therapy Re-Evaluation  Patient Details  Name: Colleen Payne MRN: [id number] Date of Birth: [birth year] No data recorded  Encounter Date: 12/10/2019   PT End of Session - 12/10/19 1042    Visit Number 1    Number of Visits 13    Date for PT Re-Evaluation 03/03/20    Authorization Type Medicare    Authorization - Visit Number 1    Authorization - Number of Visits 10    PT Start Time 1000    PT Stop Time 1045    PT Time Calculation (min) 45 min    Equipment Utilized During Treatment Other (comment)    Activity Tolerance Patient tolerated treatment well;No increased pain    Behavior During Therapy WFL for tasks assessed/performed           Past Medical History:  Diagnosis Date  . Anxiety   . Atrial fibrillation ([place])   . Depression   . Diabetes mellitus without complication (HCC)    Type I   . Headache    stress. 1x/month  . Hypertension   . Insulin pump in place   . Motion sickness    cars  . MVP (mitral valve prolapse)   . Neuromuscular disorder (HCC)    leg weakness s/p back surgeries  . Thyroid disease     Past Surgical History:  Procedure Laterality Date  . BACK SURGERY  2004   3 sugeries between Sept and Nov, fusion L2-S1  . CATARACT EXTRACTION W/PHACO Left 11/23/2014   Procedure: CATARACT EXTRACTION PHACO AND INTRAOCULAR LENS PLACEMENT (IOC);  Surgeon: Leandrew Koyanagi, MD;  Location: [facility];  Service: Ophthalmology;  Laterality: Left;  DIABETIC - insulin pump    There were no vitals filed for this visit.   Subjective Assessment - 12/10/19 1039    Subjective Arrives for reevaluation with increased pain, reports increased pain 8/10 today d/t difficulty transferring husband last night and stress d/t her nephew passing.    Pertinent History Patient is 69 yo female that  complains of lumbar pain and inability to walk erect, as well as hip/abdominal pain. Patient with history of lumbar fusion (L2-S1). Xray results show: Fairly stable appearance of the levoscoliosis of the lower thoracic and of the lumbar spine.Stable lateral subluxation toward the left of L2 with respect L3. Stable partial compression of the body of L1. Patient complains of frustration with physicians, and that her symptoms are worsening over time. 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 Reported that her pain and difficulty worsened after trying to pick her husband up about 1 year ago. Patient mentioned that she has been told that she has "spinal fluid pockets that are leaking". PMH also includes DM I with insulin pump,  afib, HTN,  HLD, chronic pain syndrome, SOB.    Limitations Standing;Walking;House hold activities;Other (comment)    Diagnostic tests see exray results: Fairly stable appearance of the levoscoliosis of the lower thoracic and lumbar spine. Stable lateral subluxation toward left of L2 with respect of L3, stable partial compression of the body of L1.    Patient Stated Goals pain relief, standing erect    Pain Onset More than a month ago           Ther-Ex - Prone press up x15; prone prop 103min childs pose 33min 5xSTS and  30sec STS PT reviewed the following HEP with patient with patient able to demonstrate a set of the following with min cuing for correction needed. PT educated patient on parameters of therex (how/when to inc/decrease intensity, frequency, rep/set range, stretch hold time, and purpose of therex) with verbalized understanding.  Access Code: [email or web address] Prone Alternating Arm and Leg Lifts - 1 x daily - 7 x weekly - 2 sets - 12 reps Seated Thoracic Lumbar Extension with Pectoralis Stretch - 1 x daily - 7 x weekly - 2 sets - 12 reps - 3-5sec hold Standing Hip Extension with Counter Support - 1 x daily - 7 x weekly - 2 sets - 12  reps Deep Squat with Dowel Overhead - 1 x daily - 7 x weekly - 2-3 sets - 6-10 reps -Backwardwalking1.58mph for74mins, 0.2mph for 39mincuing forposture and "large steps" with decent carry over. Pt able to maintain more thoracic ext with cuing to "keep eyes up"                           PT Education - 12/10/19 1040    Education Details therex form, HEP and goal update    Person(s) Educated Patient    Methods Explanation;Demonstration;Verbal cues;Handout    Comprehension Verbalized understanding;Returned demonstration;Verbal cues required            PT Short Term Goals - 12/10/19 1021      PT SHORT TERM GOAL #1   Title Patient will be adherent to initial HEP at least 3x a week to improve functional strength and balance for better safety at home.    Baseline 12/10/19 HEP given    Time 4    Period Weeks    Status New      PT SHORT TERM GOAL #2   Title Pt will increase 10MWT by at least 0.13 m/s in order to demonstrate clinically significant improvement in community ambulation    Baseline 12/10/19 0.56m/s    Time 4    Period Weeks    Status New             PT Long Term Goals - 12/10/19 1006      PT LONG TERM GOAL #1   Title Patient will be independent in bending down towards floor and picking up small object (<5 pounds) and then stand back up without increased pain to improve ability to pick up and clean up room at home    Baseline 09/25/18/20 able to pick up object without increase in pain, though she reports continued baseline pain, goal revised and achieved    Time 8    Period Weeks    Status Achieved      PT LONG TERM GOAL #2   Title Patient will increase BLE gross strength to 4+/5 as to improve functional strength for independent gait, increased standing tolerance and increased ADL ability.    Baseline 08/31/18 R/L flexion: 5/5 ER 5/5 IR 4+/4+ ABD 5/5 Ext 4+/4    Time 8    Period Weeks    Status Achieved      PT LONG TERM GOAL #3   Title  Patient will report a worst pain of 5/10 with transfers of her husband to improve tolerance with ADLs     Baseline 12/10/19 Reports 10/10 back pain with bed transfers for husband    Time 8    Period Weeks    Status Revised      PT LONG TERM GOAL #  4   Title Pt will decrease 5TSTS to 10sec order to demonstrate clinically significant improvement in LE strength, and reduced fall risk    Baseline 12/10/19 14sec    Time 8    Period Weeks    Status Revised      PT LONG TERM GOAL #5   Title Pt will increase 10MWT by at least 0.13 m/s in order to demonstrate clinically significant improvement in community ambulation    Baseline 12/10/19 0.5m/s    Time --    Period Weeks    Status Deferred      PT LONG TERM GOAL #6   Title Patient will demonstrate walk speed of 1.2 to demonstrate community ambulation norm    Baseline 12/10/19 0.73m/s    Time 8    Period Weeks    Status Revised      PT LONG TERM GOAL #7   Title Patient will increase standing tolerance to 75min without increased pain in order to complete household chores/cooking    Baseline 12/10/19 needs to sit following 14mins of standing activity d/t pain    Time 8    Period Weeks    Status On-going      PT LONG TERM GOAL #8   Title Patient will demonstrate SLS of 10sec to demonstrate decreased fall risk    Baseline 12/10/19 LLE 4sec RLE 2sec    Time 8    Period Weeks    Status Revised      PT LONG TERM GOAL  #9   TITLE Patient will demonstrate 15 STS in 30sec to demonstrate age-matched increase in LE endurance    Baseline 12/10/19 13    Time 8    Period Weeks    Status Revised                 Plan - 12/10/19 1047    Clinical Impression Statement PT re-evaluated pt today for initiation of POC with insurance approval. Patient demonstrates decreased gait speed, decreased LE strength, decreased endurance, decreased activity tolerance, and decreased spine mobility affectiing functional squatting and transferring her  husband. Participation in her role as a caregiver and in completing any standing ADLs (cooking, cleaning, community errands) is limited by impairments. Patient shows regression following inconsistency with PT d/t insurance lapse, but still improved since inital evaluation. PT educated patient on findings of re-evaluation and updated HEP for progress. PT will continue progression as able.    Personal Factors and Comorbidities Age;Behavior Pattern;Fitness;Past/Current Experience;Comorbidity 1;Comorbidity 2;Comorbidity 3+    Comorbidities scolosis, DM1, chronic pain syndrome, HTN, afib, depression    Examination-Activity Limitations Bathing;Dressing;Transfers;Caring for Others;Carry;Squat;Lift;Bend;Bed Mobility    Examination-Participation Restrictions Yard Work;Laundry;Cleaning;Community Activity    Stability/Clinical Decision Making Evolving/Moderate complexity    Clinical Decision Making Moderate    Rehab Potential Fair    Clinical Impairments Affecting Rehab Potential comorbidities, symptom duration, curvature degree    PT Frequency 2x / week    PT Duration 8 weeks    PT Treatment/Interventions ADLs/Self Care Home Management;Aquatic Therapy;Cryotherapy;Ultrasound;Parrafin;Traction;Moist Heat;Electrical Stimulation;DME Instruction;Gait training;Stair training;Functional mobility training;Neuromuscular re-education;Balance training;Therapeutic exercise;Therapeutic activities;Patient/family education;Energy conservation;Spinal Manipulations;Joint Manipulations;Passive range of motion;Dry needling;Manual techniques    PT Next Visit Plan continue POC    PT Home Exercise Plan Added standing pec stretch in doorway to HEP today    Consulted and Agree with Plan of Care Patient           Patient will benefit from skilled therapeutic intervention in order to improve the following deficits and impairments:  Abnormal gait, Decreased balance, Decreased endurance, Decreased mobility, Difficulty walking,  Hypomobility, Increased muscle spasms, Decreased range of motion, Pain, Postural dysfunction, Impaired flexibility, Increased fascial restricitons, Decreased strength, Decreased activity tolerance  Visit Diagnosis: Other idiopathic scoliosis, lumbar region  Radiculopathy, lumbar region     Problem List Patient Active Problem List   Diagnosis Date Noted  . Central sleep apnea 10/09/2016  . Hypertension 10/09/2016  . Lumbar postlaminectomy syndrome 10/09/2016  . Lumbar radiculopathy 10/09/2016  . Diabetic polyneuropathy ([place]) 10/07/2016  . Right lumbosacral radiculopathy 10/07/2016  . Healthcare maintenance 08/26/2016  . DKA (diabetic ketoacidoses) 06/14/2016  . Atypical chest pain 02/23/2016  . SOB (shortness of breath) 02/23/2016  . Pain medication agreement signed 01/13/2015  . Acquired hypothyroidism 05/30/2014  . Frontal sinusitis 12/17/2013  . Atrial fibrillation ([place]) 11/29/2013  . Major depression in remission ([place]) 11/20/2013  . 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 Chronic pain syndrome 08/27/2013  . Back pain 07/17/2013  . HTN (hypertension), benign 07/17/2013  . Hyperlipidemia 07/17/2013  . Pancreatic insufficiency 07/17/2013  . Chronic postoperative pain 01/01/2013  . Long term current use of opiate analgesic 11/04/2012  . Hypothyroidism 08/25/2012  . Radiculopathy of leg 05/01/2011   Durwin Reges DPT Durwin Reges 12/10/2019, 10:53 AM  [facility] [address] [place], [address] Phone: [phone]   Fax:  [phone]  Name: POLETTE NOFSINGER MRN: [id number] Date of Birth: [birth year]

## 2019-12-13 ENCOUNTER — Encounter: Payer: Medicare Other | Admitting: Physical Therapy

## 2019-12-22 ENCOUNTER — Encounter: Payer: Medicare Other | Admitting: Physical Therapy

## 2019-12-24 ENCOUNTER — Ambulatory Visit: Payer: Medicare Other | Attending: Anesthesiology | Admitting: Physical Therapy

## 2019-12-24 ENCOUNTER — Encounter: Payer: Self-pay | Admitting: Physical Therapy

## 2019-12-24 ENCOUNTER — Other Ambulatory Visit: Payer: Self-pay

## 2019-12-24 DIAGNOSIS — M5416 Radiculopathy, lumbar region: Secondary | ICD-10-CM

## 2019-12-24 DIAGNOSIS — M4126 Other idiopathic scoliosis, lumbar region: Secondary | ICD-10-CM | POA: Diagnosis present

## 2019-12-24 NOTE — Therapy (Signed)
[facility] [address], [place], [address] Phone: [phone]   Fax:  [phone]  Physical Therapy Treatment  Patient Details  Name: Colleen Payne MRN: [id number] Date of Birth: 26-Sep-1950 No data recorded  Encounter Date: 12/24/2019   PT End of Session - 12/24/19 1017    Visit Number 2    Number of Visits 13    Date for PT Re-Evaluation 03/03/20    Authorization Type Medicare    Authorization - Visit Number 2    Authorization - Number of Visits 10    PT Start Time 1000    PT Stop Time 1042    PT Time Calculation (min) 42 min    Equipment Utilized During Treatment Other (comment)    Activity Tolerance Patient tolerated treatment well;No increased pain    Behavior During Therapy WFL for tasks assessed/performed           Past Medical History:  Diagnosis Date  . Anxiety   . Atrial fibrillation ([place])   . Depression   . Diabetes mellitus without complication (HCC)    Type I   . Headache    stress. 1x/month  . Hypertension   . Insulin pump in place   . Motion sickness    cars  . MVP (mitral valve prolapse)   . Neuromuscular disorder (HCC)    leg weakness s/p back surgeries  . Thyroid disease     Past Surgical History:  Procedure Laterality Date  . BACK SURGERY  2004   3 sugeries between Sept and Nov, fusion L2-S1  . CATARACT EXTRACTION W/PHACO Left 11/23/2014   Procedure: CATARACT EXTRACTION PHACO AND INTRAOCULAR LENS PLACEMENT (IOC);  Surgeon: Leandrew Koyanagi, MD;  Location: [facility];  Service: Ophthalmology;  Laterality: Left;  DIABETIC - insulin pump    There were no vitals filed for this visit.   Subjective Assessment - 12/24/19 1005    Subjective Reports she has not been hurting "too bad" 6/10 pain. Reports min compliance with HEP, but is trying to "do better"    Pertinent History Patient is 69 yo female that complains of lumbar pain and inability to walk erect, as  well as hip/abdominal pain. Patient with history of lumbar fusion (L2-S1). Xray results show: Fairly stable appearance of the levoscoliosis of the lower thoracic and of the lumbar spine.Stable lateral subluxation toward the left of L2 with respect L3. Stable partial compression of the body of L1. Patient complains of frustration with physicians, and that her symptoms are worsening over time. 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 Reported that her pain and difficulty worsened after trying to pick her husband up about 1 year ago. Patient mentioned that she has been told that she has "spinal fluid pockets that are leaking". PMH also includes DM I with insulin pump,  afib, HTN,  HLD, chronic pain syndrome, SOB.    Limitations Standing;Walking;House hold activities;Other (comment)    Diagnostic tests see exray results: Fairly stable appearance of the levoscoliosis of the lower thoracic and lumbar spine. Stable lateral subluxation toward left of L2 with respect of L3, stable partial compression of the body of L1.    Patient Stated Goals pain relief, standing erect    Pain Onset More than a month ago    Pain Onset More than a month ago          Ther-Ex - Hooklying wand flexion x20 c  PVC - Prone press up x15  childs pose 38min -Quadruped Bird Dog2x 12 intermittent CGA for safety; childs pose x52min between sets -Half kneeling BUE flex with yellow tball x12 each side with CGA for safety to maintain balance, some overpressure for increased thoracic ext -Standing with back to wall, trunk extension to wall2x 15x3secH (able to get close to fully upright for short periods)standing rest between sets; PT blocking R knee - Good mornings with PVC pipe 2x 10 with min cuing for hip hinge maintaining PVC pipe elevated to allow for thorac ext with good carry over -Backwardwalking1.49mph for37mins,cuing forposture and "large steps" with decent carry over. Pt able to  maintain more thoracic ext with cuing to "keep eyes up"                           PT Education - 12/24/19 1014    Education Details therex form/technique    Person(s) Educated Patient    Methods Explanation;Demonstration;Verbal cues    Comprehension Verbalized understanding;Returned demonstration;Verbal cues required            PT Short Term Goals - 12/10/19 1021      PT SHORT TERM GOAL #1   Title Patient will be adherent to initial HEP at least 3x a week to improve functional strength and balance for better safety at home.    Baseline 12/10/19 HEP given    Time 4    Period Weeks    Status New      PT SHORT TERM GOAL #2   Title Pt will increase 10MWT by at least 0.13 m/s in order to demonstrate clinically significant improvement in community ambulation    Baseline 12/10/19 0.72m/s    Time 4    Period Weeks    Status New             PT Long Term Goals - 12/10/19 1006      PT LONG TERM GOAL #1   Title Patient will be independent in bending down towards floor and picking up small object (<5 pounds) and then stand back up without increased pain to improve ability to pick up and clean up room at home    Baseline 09/25/18/20 able to pick up object without increase in pain, though she reports continued baseline pain, goal revised and achieved    Time 8    Period Weeks    Status Achieved      PT LONG TERM GOAL #2   Title Patient will increase BLE gross strength to 4+/5 as to improve functional strength for independent gait, increased standing tolerance and increased ADL ability.    Baseline 08/31/18 R/L flexion: 5/5 ER 5/5 IR 4+/4+ ABD 5/5 Ext 4+/4    Time 8    Period Weeks    Status Achieved      PT LONG TERM GOAL #3   Title Patient will report a worst pain of 5/10 with transfers of her husband to improve tolerance with ADLs     Baseline 12/10/19 Reports 10/10 back pain with bed transfers for husband    Time 8    Period Weeks    Status Revised       PT LONG TERM GOAL #4   Title Pt will decrease 5TSTS to 10sec order to demonstrate clinically significant improvement in LE strength, and reduced fall risk    Baseline 12/10/19 14sec    Time 8    Period Weeks    Status  Revised      PT LONG TERM GOAL #5   Title Pt will increase 10MWT by at least 0.13 m/s in order to demonstrate clinically significant improvement in community ambulation    Baseline 12/10/19 0.80m/s    Time --    Period Weeks    Status Deferred      PT LONG TERM GOAL #6   Title Patient will demonstrate walk speed of 1.2 to demonstrate community ambulation norm    Baseline 12/10/19 0.78m/s    Time 8    Period Weeks    Status Revised      PT LONG TERM GOAL #7   Title Patient will increase standing tolerance to 38min without increased pain in order to complete household chores/cooking    Baseline 12/10/19 needs to sit following 32mins of standing activity d/t pain    Time 8    Period Weeks    Status On-going      PT LONG TERM GOAL #8   Title Patient will demonstrate SLS of 10sec to demonstrate decreased fall risk    Baseline 12/10/19 LLE 4sec RLE 2sec    Time 8    Period Weeks    Status Revised      PT LONG TERM GOAL  #9   TITLE Patient will demonstrate 15 STS in 30sec to demonstrate age-matched increase in LE endurance    Baseline 12/10/19 13    Time 8    Period Weeks    Status Revised                 Plan - 12/24/19 1033    Clinical Impression Statement PT continued therex progression for increased extension and thoracic mobility with success. Pt is able to comply with all cuing for proper technique of therex working through functional positions (supine>prone>qped>half kneel> stand) toward wt bearing and functional movements (hip hinge, ambulation) with good motivaiton throughout session. PT will continue progression as able.    Personal Factors and Comorbidities Age;Behavior Pattern;Fitness;Past/Current Experience;Comorbidity 1;Comorbidity  2;Comorbidity 3+    Comorbidities scolosis, DM1, chronic pain syndrome, HTN, afib, depression    Examination-Activity Limitations Bathing;Dressing;Transfers;Caring for Others;Carry;Squat;Lift;Bend;Bed Mobility    Examination-Participation Restrictions Yard Work;Laundry;Cleaning;Community Activity    Stability/Clinical Decision Making Evolving/Moderate complexity    Clinical Decision Making Moderate    Clinical Impairments Affecting Rehab Potential comorbidities, symptom duration, curvature degree    PT Frequency 2x / week    PT Duration 8 weeks    PT Treatment/Interventions ADLs/Self Care Home Management;Aquatic Therapy;Cryotherapy;Ultrasound;Parrafin;Traction;Moist Heat;Electrical Stimulation;DME Instruction;Gait training;Stair training;Functional mobility training;Neuromuscular re-education;Balance training;Therapeutic exercise;Therapeutic activities;Patient/family education;Energy conservation;Spinal Manipulations;Joint Manipulations;Passive range of motion;Dry needling;Manual techniques    PT Next Visit Plan continue POC    PT Home Exercise Plan Added standing pec stretch in doorway to HEP today    Consulted and Agree with Plan of Care Patient           Patient will benefit from skilled therapeutic intervention in order to improve the following deficits and impairments:  Abnormal gait, Decreased balance, Decreased endurance, Decreased mobility, Difficulty walking, Hypomobility, Increased muscle spasms, Decreased range of motion, Pain, Postural dysfunction, Impaired flexibility, Increased fascial restricitons, Decreased strength, Decreased activity tolerance  Visit Diagnosis: Other idiopathic scoliosis, lumbar region  Radiculopathy, lumbar region     Problem List Patient Active Problem List   Diagnosis Date Noted  . Central sleep apnea 10/09/2016  . Hypertension 10/09/2016  . Lumbar postlaminectomy syndrome 10/09/2016  . Lumbar radiculopathy 10/09/2016  . Diabetic polyneuropathy  ([place]) 10/07/2016  .  Right lumbosacral radiculopathy 10/07/2016  . Healthcare maintenance 08/26/2016  . DKA (diabetic ketoacidoses) 06/14/2016  . Atypical chest pain 02/23/2016  . SOB (shortness of breath) 02/23/2016  . Pain medication agreement signed 01/13/2015  . Acquired hypothyroidism 05/30/2014  . Frontal sinusitis 12/17/2013  . Atrial fibrillation ([place]) 11/29/2013  . Major depression in remission ([place]) 11/20/2013  . 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 Chronic pain syndrome 08/27/2013  . Back pain 07/17/2013  . HTN (hypertension), benign 07/17/2013  . Hyperlipidemia 07/17/2013  . Pancreatic insufficiency 07/17/2013  . Chronic postoperative pain 01/01/2013  . Long term current use of opiate analgesic 11/04/2012  . Hypothyroidism 08/25/2012  . Radiculopathy of leg 05/01/2011    Durwin Reges 12/24/2019, 10:39 AM  [facility] [address] [place], [address] Phone: [phone]   Fax:  [phone]  Name: FRIEDA ARNALL MRN: [id number] Date of Birth: Nov 01, 1950

## 2019-12-27 ENCOUNTER — Encounter: Payer: Medicare Other | Admitting: Physical Therapy

## 2019-12-31 ENCOUNTER — Encounter: Payer: Self-pay | Admitting: Physical Therapy

## 2019-12-31 ENCOUNTER — Other Ambulatory Visit: Payer: Self-pay

## 2019-12-31 ENCOUNTER — Ambulatory Visit: Payer: Medicare Other | Admitting: Physical Therapy

## 2019-12-31 ENCOUNTER — Encounter: Payer: Medicare Other | Admitting: Physical Therapy

## 2019-12-31 DIAGNOSIS — M4126 Other idiopathic scoliosis, lumbar region: Secondary | ICD-10-CM | POA: Diagnosis not present

## 2019-12-31 DIAGNOSIS — M5416 Radiculopathy, lumbar region: Secondary | ICD-10-CM

## 2019-12-31 NOTE — Therapy (Signed)
[facility] [address] [place], [address] Phone: [phone]   Fax:  [phone]  Physical Therapy Treatment  Patient Details  Name: Colleen Payne MRN: [id number] Date of Birth: 03-19-1950 No data recorded  Encounter Date: 12/31/2019   PT End of Session - 12/31/19 1020    Visit Number 3    Number of Visits 13    Date for PT Re-Evaluation 03/03/20    Authorization - Visit Number 3    Authorization - Number of Visits 10    PT Start Time 7867    PT Stop Time 1046    PT Time Calculation (min) 38 min    Equipment Utilized During Treatment Other (comment)    Activity Tolerance Patient tolerated treatment well;No increased pain    Behavior During Therapy WFL for tasks assessed/performed           Past Medical History:  Diagnosis Date  . Anxiety   . Atrial fibrillation ([place])   . Depression   . Diabetes mellitus without complication (HCC)    Type I   . Headache    stress. 1x/month  . Hypertension   . Insulin pump in place   . Motion sickness    cars  . MVP (mitral valve prolapse)   . Neuromuscular disorder (HCC)    leg weakness s/p back surgeries  . Thyroid disease     Past Surgical History:  Procedure Laterality Date  . BACK SURGERY  2004   3 sugeries between Sept and Nov, fusion L2-S1  . CATARACT EXTRACTION W/PHACO Left 11/23/2014   Procedure: CATARACT EXTRACTION PHACO AND INTRAOCULAR LENS PLACEMENT (IOC);  Surgeon: Leandrew Koyanagi, MD;  Location: [facility];  Service: Ophthalmology;  Laterality: Left;  DIABETIC - insulin pump    There were no vitals filed for this visit.   Subjective Assessment - 12/31/19 1017    Subjective Reports she was doing very well after last session until yesterday when she had to sit in the back seat to her husbands appts in [place] and [place], reports 9/10 pain today.    Pertinent History Patient is 69 yo female that complains of lumbar pain  and inability to walk erect, as well as hip/abdominal pain. Patient with history of lumbar fusion (L2-S1). Xray results show: Fairly stable appearance of the levoscoliosis of the lower thoracic and of the lumbar spine.Stable lateral subluxation toward the left of L2 with respect L3. Stable partial compression of the body of L1. Patient complains of frustration with physicians, and that her symptoms are worsening over time. 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 Reported that her pain and difficulty worsened after trying to pick her husband up about 1 year ago. Patient mentioned that she has been told that she has "spinal fluid pockets that are leaking". PMH also includes DM I with insulin pump,  afib, HTN,  HLD, chronic pain syndrome, SOB.    Limitations Standing;Walking;House hold activities;Other (comment)    Diagnostic tests see exray results: Fairly stable appearance of the levoscoliosis of the lower thoracic and lumbar spine. Stable lateral subluxation toward left of L2 with respect of L3, stable partial compression of the body of L1.    Patient Stated Goals pain relief, standing erect    Pain Onset More than a month ago    Pain Onset More than a month ago  Ther-Ex - Hooklying wand flexion x20 c PVC - Prone press up x15; with prone prop 50min following - Supine alt supermans 2x 12  -Quadruped Bird Dog2x 12 intermittent CGA for safety; childs pose x28min between sets -Half kneeling BUE flex with yellow tball x12 each side with CGA for safety to maintain balance, some overpressure for increased thoracic ext -Standing with back to wall, trunk extension to wallx15x3secH (able to get close to fully upright for short periods)standing rest between sets; PT blocking R knee - Good mornings with PVC pipe 2x 10 with min cuing initially with good carry over following -Backwardwalking1.70mph for57mins,cuing forposture and "large steps" with  decent carry over. Pt able to maintain more thoracic ext with cuing to "keep eyes up"                           PT Education - 12/31/19 1020    Education Details therex form/technique    Person(s) Educated Patient    Methods Explanation;Demonstration;Verbal cues    Comprehension Verbalized understanding;Returned demonstration;Verbal cues required            PT Short Term Goals - 12/10/19 1021      PT SHORT TERM GOAL #1   Title Patient will be adherent to initial HEP at least 3x a week to improve functional strength and balance for better safety at home.    Baseline 12/10/19 HEP given    Time 4    Period Weeks    Status New      PT SHORT TERM GOAL #2   Title Pt will increase 10MWT by at least 0.13 m/s in order to demonstrate clinically significant improvement in community ambulation    Baseline 12/10/19 0.17m/s    Time 4    Period Weeks    Status New             PT Long Term Goals - 12/10/19 1006      PT LONG TERM GOAL #1   Title Patient will be independent in bending down towards floor and picking up small object (<5 pounds) and then stand back up without increased pain to improve ability to pick up and clean up room at home    Baseline 09/25/18/20 able to pick up object without increase in pain, though she reports continued baseline pain, goal revised and achieved    Time 8    Period Weeks    Status Achieved      PT LONG TERM GOAL #2   Title Patient will increase BLE gross strength to 4+/5 as to improve functional strength for independent gait, increased standing tolerance and increased ADL ability.    Baseline 08/31/18 R/L flexion: 5/5 ER 5/5 IR 4+/4+ ABD 5/5 Ext 4+/4    Time 8    Period Weeks    Status Achieved      PT LONG TERM GOAL #3   Title Patient will report a worst pain of 5/10 with transfers of her husband to improve tolerance with ADLs     Baseline 12/10/19 Reports 10/10 back pain with bed transfers for husband    Time 8     Period Weeks    Status Revised      PT LONG TERM GOAL #4   Title Pt will decrease 5TSTS to 10sec order to demonstrate clinically significant improvement in LE strength, and reduced fall risk    Baseline 12/10/19 14sec    Time 8    Period Weeks  Status Revised      PT LONG TERM GOAL #5   Title Pt will increase 10MWT by at least 0.13 m/s in order to demonstrate clinically significant improvement in community ambulation    Baseline 12/10/19 0.35m/s    Time --    Period Weeks    Status Deferred      PT LONG TERM GOAL #6   Title Patient will demonstrate walk speed of 1.2 to demonstrate community ambulation norm    Baseline 12/10/19 0.29m/s    Time 8    Period Weeks    Status Revised      PT LONG TERM GOAL #7   Title Patient will increase standing tolerance to 16min without increased pain in order to complete household chores/cooking    Baseline 12/10/19 needs to sit following 27mins of standing activity d/t pain    Time 8    Period Weeks    Status On-going      PT LONG TERM GOAL #8   Title Patient will demonstrate SLS of 10sec to demonstrate decreased fall risk    Baseline 12/10/19 LLE 4sec RLE 2sec    Time 8    Period Weeks    Status Revised      PT LONG TERM GOAL  #9   TITLE Patient will demonstrate 15 STS in 30sec to demonstrate age-matched increase in LE endurance    Baseline 12/10/19 13    Time 8    Period Weeks    Status Revised                 Plan - 12/31/19 1030    Clinical Impression Statement PT continued therex progression for increased extension and thoracic mobility with success. Pt is motivated throughout session and is able to complete all therex with proper technique following cuing and encouragement. Patient with increased upright standing and decreased pain following session. PT will continue progression as able.    Personal Factors and Comorbidities Age;Behavior Pattern;Fitness;Past/Current Experience;Comorbidity 1;Comorbidity 2;Comorbidity 3+     Comorbidities scolosis, DM1, chronic pain syndrome, HTN, afib, depression    Examination-Activity Limitations Bathing;Dressing;Transfers;Caring for Others;Carry;Squat;Lift;Bend;Bed Mobility    Examination-Participation Restrictions Yard Work;Laundry;Cleaning;Community Activity    Stability/Clinical Decision Making Evolving/Moderate complexity    Clinical Decision Making Moderate    Rehab Potential Fair    Clinical Impairments Affecting Rehab Potential comorbidities, symptom duration, curvature degree    PT Frequency 2x / week    PT Duration 8 weeks    PT Treatment/Interventions ADLs/Self Care Home Management;Aquatic Therapy;Cryotherapy;Ultrasound;Parrafin;Traction;Moist Heat;Electrical Stimulation;DME Instruction;Gait training;Stair training;Functional mobility training;Neuromuscular re-education;Balance training;Therapeutic exercise;Therapeutic activities;Patient/family education;Energy conservation;Spinal Manipulations;Joint Manipulations;Passive range of motion;Dry needling;Manual techniques    PT Next Visit Plan continue POC    PT Home Exercise Plan Added standing pec stretch in doorway to HEP today    Consulted and Agree with Plan of Care Patient           Patient will benefit from skilled therapeutic intervention in order to improve the following deficits and impairments:  Abnormal gait,Decreased balance,Decreased endurance,Decreased mobility,Difficulty walking,Hypomobility,Increased muscle spasms,Decreased range of motion,Pain,Postural dysfunction,Impaired flexibility,Increased fascial restricitons,Decreased strength,Decreased activity tolerance  Visit Diagnosis: Other idiopathic scoliosis, lumbar region  Radiculopathy, lumbar region     Problem List Patient Active Problem List   Diagnosis Date Noted  . Central sleep apnea 10/09/2016  . Hypertension 10/09/2016  . Lumbar postlaminectomy syndrome 10/09/2016  . Lumbar radiculopathy 10/09/2016  . Diabetic polyneuropathy ([place])  10/07/2016  . Right lumbosacral radiculopathy 10/07/2016  . Healthcare maintenance 08/26/2016  .  DKA (diabetic ketoacidoses) 06/14/2016  . Atypical chest pain 02/23/2016  . SOB (shortness of breath) 02/23/2016  . Pain medication agreement signed 01/13/2015  . Acquired hypothyroidism 05/30/2014  . Frontal sinusitis 12/17/2013  . Atrial fibrillation ([place]) 11/29/2013  . Major depression in remission ([place]) 11/20/2013  . 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 Chronic pain syndrome 08/27/2013  . Back pain 07/17/2013  . HTN (hypertension), benign 07/17/2013  . Hyperlipidemia 07/17/2013  . Pancreatic insufficiency 07/17/2013  . Chronic postoperative pain 01/01/2013  . Long term current use of opiate analgesic 11/04/2012  . Hypothyroidism 08/25/2012  . Radiculopathy of leg 05/01/2011   Durwin Reges DPT Durwin Reges 12/31/2019, 10:42 AM  [facility] [address], [place], [address] Phone: [phone]   Fax:  [phone]  Name: Colleen Payne MRN: [id number] Date of Birth: 1950-06-08

## 2020-01-03 ENCOUNTER — Encounter: Payer: Medicare Other | Admitting: Physical Therapy

## 2020-01-07 ENCOUNTER — Ambulatory Visit: Payer: Medicare Other | Admitting: Physical Therapy

## 2020-01-07 ENCOUNTER — Other Ambulatory Visit: Payer: Self-pay

## 2020-01-07 ENCOUNTER — Encounter: Payer: Self-pay | Admitting: Physical Therapy

## 2020-01-07 DIAGNOSIS — M4126 Other idiopathic scoliosis, lumbar region: Secondary | ICD-10-CM

## 2020-01-07 DIAGNOSIS — M5416 Radiculopathy, lumbar region: Secondary | ICD-10-CM

## 2020-01-07 NOTE — Therapy (Signed)
[facility] [address], [place], [address] Phone: [phone]   Fax:  [phone]  Physical Therapy Treatment  Patient Details  Name: Colleen Payne MRN: [id number] Date of Birth: 09-Sep-1950 No data recorded  Encounter Date: 01/07/2020   PT End of Session - 01/07/20 1012    Visit Number 4    Number of Visits 13    Date for PT Re-Evaluation 03/03/20    Authorization - Visit Number 4    Authorization - Number of Visits 10    PT Start Time 0034    PT Stop Time 1045    PT Time Calculation (min) 39 min    Equipment Utilized During Treatment Other (comment)    Activity Tolerance Patient tolerated treatment well;No increased pain    Behavior During Therapy WFL for tasks assessed/performed           Past Medical History:  Diagnosis Date  . Anxiety   . Atrial fibrillation ([place])   . Depression   . Diabetes mellitus without complication (HCC)    Type I   . Headache    stress. 1x/month  . Hypertension   . Insulin pump in place   . Motion sickness    cars  . MVP (mitral valve prolapse)   . Neuromuscular disorder (HCC)    leg weakness s/p back surgeries  . Thyroid disease     Past Surgical History:  Procedure Laterality Date  . BACK SURGERY  2004   3 sugeries between Sept and Nov, fusion L2-S1  . CATARACT EXTRACTION W/PHACO Left 11/23/2014   Procedure: CATARACT EXTRACTION PHACO AND INTRAOCULAR LENS PLACEMENT (IOC);  Surgeon: Leandrew Koyanagi, MD;  Location: [facility];  Service: Ophthalmology;  Laterality: Left;  DIABETIC - insulin pump    There were no vitals filed for this visit.   Subjective Assessment - 01/07/20 1010    Subjective Reports she is continuing to feel a little better following more regular PT attendence. Reports pain is "not too bad this am" rating it 8/10 on NPRS    Pertinent History Patient is 69 yo female that complains of lumbar pain and inability to walk erect,  as well as hip/abdominal pain. Patient with history of lumbar fusion (L2-S1). Xray results show: Fairly stable appearance of the levoscoliosis of the lower thoracic and of the lumbar spine.Stable lateral subluxation toward the left of L2 with respect L3. Stable partial compression of the body of L1. Patient complains of frustration with physicians, and that her symptoms are worsening over time. 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 Reported that her pain and difficulty worsened after trying to pick her husband up about 1 year ago. Patient mentioned that she has been told that she has "spinal fluid pockets that are leaking". PMH also includes DM I with insulin pump,  afib, HTN,  HLD, chronic pain syndrome, SOB.    Limitations Standing;Walking;House hold activities;Other (comment)    Diagnostic tests see exray results: Fairly stable appearance of the levoscoliosis of the lower thoracic and lumbar spine. Stable lateral subluxation toward left of L2 with respect of L3, stable partial compression of the body of L1.    Patient Stated Goals pain relief, standing erect    Pain Onset More than a month ago    Pain Onset More than a month ago             Ther-Ex -Hooklying wand flexion  x20 c PVC - Prone press up x15; with prone prop 63min following - Prone hands behind hand scapular retraction with bilat elbow lift x12  - Supine alt supermans x12  -Half kneeling BUE flex with yellow tball x12 each side with CGA for safety to maintain balance, some overpressure for increased thoracic ext Squat with dowel overhead 2x 12 with min cuing for dowel placement with good carry over  LE flex<>ext swings x12 each LE with cuing to prevent lumbar flex with hip ext with some carry over -Backwardwalking1.5mph for40mins,cuing forposture and "large steps" with decent carry over. Pt able to maintain more thoracic ext with cuing to "keep eyes up"          PT  Education - 01/07/20 1012    Education Details therex form/technique    Person(s) Educated Patient    Methods Explanation;Demonstration;Verbal cues    Comprehension Verbalized understanding;Returned demonstration;Verbal cues required            PT Short Term Goals - 12/10/19 1021      PT SHORT TERM GOAL #1   Title Patient will be adherent to initial HEP at least 3x a week to improve functional strength and balance for better safety at home.    Baseline 12/10/19 HEP given    Time 4    Period Weeks    Status New      PT SHORT TERM GOAL #2   Title Pt will increase 10MWT by at least 0.13 m/s in order to demonstrate clinically significant improvement in community ambulation    Baseline 12/10/19 0.47m/s    Time 4    Period Weeks    Status New             PT Long Term Goals - 12/10/19 1006      PT LONG TERM GOAL #1   Title Patient will be independent in bending down towards floor and picking up small object (<5 pounds) and then stand back up without increased pain to improve ability to pick up and clean up room at home    Baseline 09/25/18/20 able to pick up object without increase in pain, though she reports continued baseline pain, goal revised and achieved    Time 8    Period Weeks    Status Achieved      PT LONG TERM GOAL #2   Title Patient will increase BLE gross strength to 4+/5 as to improve functional strength for independent gait, increased standing tolerance and increased ADL ability.    Baseline 08/31/18 R/L flexion: 5/5 ER 5/5 IR 4+/4+ ABD 5/5 Ext 4+/4    Time 8    Period Weeks    Status Achieved      PT LONG TERM GOAL #3   Title Patient will report a worst pain of 5/10 with transfers of her husband to improve tolerance with ADLs     Baseline 12/10/19 Reports 10/10 back pain with bed transfers for husband    Time 8    Period Weeks    Status Revised      PT LONG TERM GOAL #4   Title Pt will decrease 5TSTS to 10sec order to demonstrate clinically significant  improvement in LE strength, and reduced fall risk    Baseline 12/10/19 14sec    Time 8    Period Weeks    Status Revised      PT LONG TERM GOAL #5   Title Pt will increase 10MWT by at least 0.13 m/s in order to  demonstrate clinically significant improvement in community ambulation    Baseline 12/10/19 0.56m/s    Time --    Period Weeks    Status Deferred      PT LONG TERM GOAL #6   Title Patient will demonstrate walk speed of 1.2 to demonstrate community ambulation norm    Baseline 12/10/19 0.15m/s    Time 8    Period Weeks    Status Revised      PT LONG TERM GOAL #7   Title Patient will increase standing tolerance to 95min without increased pain in order to complete household chores/cooking    Baseline 12/10/19 needs to sit following 57mins of standing activity d/t pain    Time 8    Period Weeks    Status On-going      PT LONG TERM GOAL #8   Title Patient will demonstrate SLS of 10sec to demonstrate decreased fall risk    Baseline 12/10/19 LLE 4sec RLE 2sec    Time 8    Period Weeks    Status Revised      PT LONG TERM GOAL  #9   TITLE Patient will demonstrate 15 STS in 30sec to demonstrate age-matched increase in LE endurance    Baseline 12/10/19 13    Time 8    Period Weeks    Status Revised                 Plan - 01/07/20 1022    Clinical Impression Statement PT continued therex progression for increased extension and thoracic mobility with success. PT continued progression through wt bearing positions with patient able to complete therex with proper technique following multimodal cuing, with good motivation and no increased pain throughout session. Reports decreased pain following session. PT will continue progression as able.    Personal Factors and Comorbidities Age;Behavior Pattern;Fitness;Past/Current Experience;Comorbidity 1;Comorbidity 2;Comorbidity 3+    Comorbidities scolosis, DM1, chronic pain syndrome, HTN, afib, depression    Examination-Activity  Limitations Bathing;Dressing;Transfers;Caring for Others;Carry;Squat;Lift;Bend;Bed Mobility    Examination-Participation Restrictions Yard Work;Laundry;Cleaning;Community Activity    Stability/Clinical Decision Making Evolving/Moderate complexity    Clinical Decision Making Moderate    Rehab Potential Fair    Clinical Impairments Affecting Rehab Potential comorbidities, symptom duration, curvature degree    PT Frequency 2x / week    PT Duration 8 weeks    PT Treatment/Interventions ADLs/Self Care Home Management;Aquatic Therapy;Cryotherapy;Ultrasound;Parrafin;Traction;Moist Heat;Electrical Stimulation;DME Instruction;Gait training;Stair training;Functional mobility training;Neuromuscular re-education;Balance training;Therapeutic exercise;Therapeutic activities;Patient/family education;Energy conservation;Spinal Manipulations;Joint Manipulations;Passive range of motion;Dry needling;Manual techniques    PT Next Visit Plan continue POC    PT Home Exercise Plan Added standing pec stretch in doorway to HEP today    Consulted and Agree with Plan of Care Patient           Patient will benefit from skilled therapeutic intervention in order to improve the following deficits and impairments:  Abnormal gait,Decreased balance,Decreased endurance,Decreased mobility,Difficulty walking,Hypomobility,Increased muscle spasms,Decreased range of motion,Pain,Postural dysfunction,Impaired flexibility,Increased fascial restricitons,Decreased strength,Decreased activity tolerance  Visit Diagnosis: Other idiopathic scoliosis, lumbar region  Radiculopathy, lumbar region     Problem List Patient Active Problem List   Diagnosis Date Noted  . Central sleep apnea 10/09/2016  . Hypertension 10/09/2016  . Lumbar postlaminectomy syndrome 10/09/2016  . Lumbar radiculopathy 10/09/2016  . Diabetic polyneuropathy ([place]) 10/07/2016  . Right lumbosacral radiculopathy 10/07/2016  . Healthcare maintenance 08/26/2016  .  DKA (diabetic ketoacidoses) 06/14/2016  . Atypical chest pain 02/23/2016  . SOB (shortness of breath) 02/23/2016  . Pain medication agreement  signed 01/13/2015  . Acquired hypothyroidism 05/30/2014  . Frontal sinusitis 12/17/2013  . Atrial fibrillation ([place]) 11/29/2013  . Major depression in remission ([place]) 11/20/2013  . 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 Chronic pain syndrome 08/27/2013  . Back pain 07/17/2013  . HTN (hypertension), benign 07/17/2013  . Hyperlipidemia 07/17/2013  . Pancreatic insufficiency 07/17/2013  . Chronic postoperative pain 01/01/2013  . Long term current use of opiate analgesic 11/04/2012  . Hypothyroidism 08/25/2012  . Radiculopathy of leg 05/01/2011   Durwin Reges DPT Durwin Reges 01/07/2020, 10:44 AM  [facility] [address] [place], [address] Phone: [phone]   Fax:  [phone]  Name: TIYANA GALLA MRN: [id number] Date of Birth: 22-May-1950

## 2020-01-10 ENCOUNTER — Other Ambulatory Visit: Payer: Self-pay

## 2020-01-10 ENCOUNTER — Ambulatory Visit: Payer: Medicare Other | Admitting: Physical Therapy

## 2020-01-10 ENCOUNTER — Encounter: Payer: Self-pay | Admitting: Physical Therapy

## 2020-01-10 DIAGNOSIS — M5416 Radiculopathy, lumbar region: Secondary | ICD-10-CM

## 2020-01-10 DIAGNOSIS — M4126 Other idiopathic scoliosis, lumbar region: Secondary | ICD-10-CM

## 2020-01-10 NOTE — Therapy (Signed)
[facility] [address], [place], [address] Phone: [phone]   Fax:  [phone]  Physical Therapy Treatment  Patient Details  Name: Colleen Payne MRN: [id number] Date of Birth: 1950/05/03 No data recorded  Encounter Date: 01/10/2020   PT End of Session - 01/10/20 1125    Visit Number 5    Number of Visits 13    Date for PT Re-Evaluation 03/03/20    Authorization Type Medicare    Authorization Time Period 5/10    Authorization - Visit Number 5    Authorization - Number of Visits 10    PT Start Time 1119    PT Stop Time 5732    PT Time Calculation (min) 38 min    Activity Tolerance Patient tolerated treatment well;No increased pain    Behavior During Therapy WFL for tasks assessed/performed           Past Medical History:  Diagnosis Date  . Anxiety   . Atrial fibrillation ([facility])   . Depression   . Diabetes mellitus without complication (HCC)    Type I   . Headache    stress. 1x/month  . Hypertension   . Insulin pump in place   . Motion sickness    cars  . MVP (mitral valve prolapse)   . Neuromuscular disorder (HCC)    leg weakness s/p back surgeries  . Thyroid disease     Past Surgical History:  Procedure Laterality Date  . BACK SURGERY  2004   3 sugeries between Sept and Nov, fusion L2-S1  . CATARACT EXTRACTION W/PHACO Left 11/23/2014   Procedure: CATARACT EXTRACTION PHACO AND INTRAOCULAR LENS PLACEMENT (IOC);  Surgeon: Leandrew Koyanagi, MD;  Location: [facility];  Service: Ophthalmology;  Laterality: Left;  DIABETIC - insulin pump    There were no vitals filed for this visit.   Subjective Assessment - 01/10/20 1121    Subjective Reports she is "stiff" this am. 8/10 pain    Pertinent History Patient is 69 yo female that complains of lumbar pain and inability to walk erect, as well as hip/abdominal pain. Patient with history of lumbar fusion (L2-S1). Xray results show:  Fairly stable appearance of the levoscoliosis of the lower thoracic and of the lumbar spine.Stable lateral subluxation toward the left of L2 with respect L3. Stable partial compression of the body of L1. Patient complains of frustration with physicians, and that her symptoms are worsening over time. 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 Reported that her pain and difficulty worsened after trying to pick her husband up about 1 year ago. Patient mentioned that she has been told that she has "spinal fluid pockets that are leaking". PMH also includes DM I with insulin pump,  afib, HTN,  HLD, chronic pain syndrome, SOB.    Limitations Standing;Walking;House hold activities;Other (comment)    Diagnostic tests see exray results: Fairly stable appearance of the levoscoliosis of the lower thoracic and lumbar spine. Stable lateral subluxation toward left of L2 with respect of L3, stable partial compression of the body of L1.    Patient Stated Goals pain relief, standing erect    Pain Onset More than a month ago    Pain Onset More than a month ago               Ther-Ex -Hooklying wand flexion x20 c PVC - Prone press up x15; with prone prop 4min following -  Prone hands behind hand scapular retraction with bilat elbow lift x12  - Alt bird dogs 2x 12 with min TC for stabilization; childs pose after each set 71min - Squat with dowel overhead 2x 12 with min cuing for dowel placement with good carry over  -Standing with back to wall, trunk extension to wall2x 15x3secH (able to get close to fully upright for short periods)standing rest between sets; PT blocking R knee LE flex<>ext swings x12 each LE with cuing to prevent lumbar flex with hip ext with some carry over -Backwardwalking1.62mph for63mins; 0.8 for 31min,cuing forposture and "large steps" with decent carry over. Pt able to maintain more thoracic ext with cuing to "keep eyes  up"                        PT Education - 01/10/20 1124    Education Details therex form/technique    Person(s) Educated Patient    Methods Explanation;Demonstration;Verbal cues    Comprehension Verbalized understanding;Returned demonstration;Verbal cues required            PT Short Term Goals - 12/10/19 1021      PT SHORT TERM GOAL #1   Title Patient will be adherent to initial HEP at least 3x a week to improve functional strength and balance for better safety at home.    Baseline 12/10/19 HEP given    Time 4    Period Weeks    Status New      PT SHORT TERM GOAL #2   Title Pt will increase 10MWT by at least 0.13 m/s in order to demonstrate clinically significant improvement in community ambulation    Baseline 12/10/19 0.60m/s    Time 4    Period Weeks    Status New             PT Long Term Goals - 12/10/19 1006      PT LONG TERM GOAL #1   Title Patient will be independent in bending down towards floor and picking up small object (<5 pounds) and then stand back up without increased pain to improve ability to pick up and clean up room at home    Baseline 09/25/18/20 able to pick up object without increase in pain, though she reports continued baseline pain, goal revised and achieved    Time 8    Period Weeks    Status Achieved      PT LONG TERM GOAL #2   Title Patient will increase BLE gross strength to 4+/5 as to improve functional strength for independent gait, increased standing tolerance and increased ADL ability.    Baseline 08/31/18 R/L flexion: 5/5 ER 5/5 IR 4+/4+ ABD 5/5 Ext 4+/4    Time 8    Period Weeks    Status Achieved      PT LONG TERM GOAL #3   Title Patient will report a worst pain of 5/10 with transfers of her husband to improve tolerance with ADLs     Baseline 12/10/19 Reports 10/10 back pain with bed transfers for husband    Time 8    Period Weeks    Status Revised      PT LONG TERM GOAL #4   Title Pt will decrease 5TSTS to  10sec order to demonstrate clinically significant improvement in LE strength, and reduced fall risk    Baseline 12/10/19 14sec    Time 8    Period Weeks    Status Revised      PT  LONG TERM GOAL #5   Title Pt will increase 10MWT by at least 0.13 m/s in order to demonstrate clinically significant improvement in community ambulation    Baseline 12/10/19 0.81m/s    Time --    Period Weeks    Status Deferred      PT LONG TERM GOAL #6   Title Patient will demonstrate walk speed of 1.2 to demonstrate community ambulation norm    Baseline 12/10/19 0.53m/s    Time 8    Period Weeks    Status Revised      PT LONG TERM GOAL #7   Title Patient will increase standing tolerance to 49min without increased pain in order to complete household chores/cooking    Baseline 12/10/19 needs to sit following 62mins of standing activity d/t pain    Time 8    Period Weeks    Status On-going      PT LONG TERM GOAL #8   Title Patient will demonstrate SLS of 10sec to demonstrate decreased fall risk    Baseline 12/10/19 LLE 4sec RLE 2sec    Time 8    Period Weeks    Status Revised      PT LONG TERM GOAL  #9   TITLE Patient will demonstrate 15 STS in 30sec to demonstrate age-matched increase in LE endurance    Baseline 12/10/19 13    Time 8    Period Weeks    Status Revised                 Plan - 01/10/20 1150    Clinical Impression Statement PT continued therex progression for increased extension and thoracic mobility with success. Pt is continuing to show better maintenance of mobility between sessions. Patient is able to comply with all cuing for proper technique of therex with good motivation throughout session. PT will continue progression as able.    Personal Factors and Comorbidities Age;Behavior Pattern;Fitness;Past/Current Experience;Comorbidity 1;Comorbidity 2;Comorbidity 3+    Comorbidities scolosis, DM1, chronic pain syndrome, HTN, afib, depression    Examination-Activity  Limitations Bathing;Dressing;Transfers;Caring for Others;Carry;Squat;Lift;Bend;Bed Mobility    Examination-Participation Restrictions Yard Work;Laundry;Cleaning;Community Activity    Stability/Clinical Decision Making Evolving/Moderate complexity    Clinical Decision Making Moderate    Rehab Potential Fair    Clinical Impairments Affecting Rehab Potential comorbidities, symptom duration, curvature degree    PT Frequency 2x / week    PT Duration 8 weeks    PT Treatment/Interventions ADLs/Self Care Home Management;Aquatic Therapy;Cryotherapy;Ultrasound;Parrafin;Traction;Moist Heat;Electrical Stimulation;DME Instruction;Gait training;Stair training;Functional mobility training;Neuromuscular re-education;Balance training;Therapeutic exercise;Therapeutic activities;Patient/family education;Energy conservation;Spinal Manipulations;Joint Manipulations;Passive range of motion;Dry needling;Manual techniques    PT Next Visit Plan continue POC    PT Home Exercise Plan Added standing pec stretch in doorway to HEP today    Consulted and Agree with Plan of Care Patient           Patient will benefit from skilled therapeutic intervention in order to improve the following deficits and impairments:  Abnormal gait,Decreased balance,Decreased endurance,Decreased mobility,Difficulty walking,Hypomobility,Increased muscle spasms,Decreased range of motion,Pain,Postural dysfunction,Impaired flexibility,Increased fascial restricitons,Decreased strength,Decreased activity tolerance  Visit Diagnosis: Other idiopathic scoliosis, lumbar region  Radiculopathy, lumbar region     Problem List Patient Active Problem List   Diagnosis Date Noted  . Central sleep apnea 10/09/2016  . Hypertension 10/09/2016  . Lumbar postlaminectomy syndrome 10/09/2016  . Lumbar radiculopathy 10/09/2016  . Diabetic polyneuropathy ([place]) 10/07/2016  . Right lumbosacral radiculopathy 10/07/2016  . Healthcare maintenance 08/26/2016  .  DKA (diabetic ketoacidoses) 06/14/2016  . Atypical  chest pain 02/23/2016  . SOB (shortness of breath) 02/23/2016  . Pain medication agreement signed 01/13/2015  . Acquired hypothyroidism 05/30/2014  . Frontal sinusitis 12/17/2013  . Atrial fibrillation ([facility]) 11/29/2013  . Major depression in remission ([facility]) 11/20/2013  . 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 Chronic pain syndrome 08/27/2013  . Back pain 07/17/2013  . HTN (hypertension), benign 07/17/2013  . Hyperlipidemia 07/17/2013  . Pancreatic insufficiency 07/17/2013  . Chronic postoperative pain 01/01/2013  . Long term current use of opiate analgesic 11/04/2012  . Hypothyroidism 08/25/2012  . Radiculopathy of leg 05/01/2011   Durwin Reges DPT Durwin Reges 01/10/2020, 11:57 AM  [facility] [address] [place], [address] Phone: [phone]   Fax:  [phone]  Name: ZENIAH BRINEY MRN: [id number] Date of Birth: 07-08-1950

## 2020-01-19 ENCOUNTER — Ambulatory Visit: Payer: Medicare Other | Admitting: Physical Therapy

## 2020-01-26 ENCOUNTER — Ambulatory Visit: Payer: Medicare Other | Attending: Anesthesiology | Admitting: Physical Therapy

## 2020-01-27 ENCOUNTER — Telehealth: Payer: Self-pay

## 2020-01-27 ENCOUNTER — Other Ambulatory Visit: Payer: Self-pay

## 2020-01-28 ENCOUNTER — Ambulatory Visit: Payer: Medicare Other | Admitting: Physical Therapy

## 2020-02-01 ENCOUNTER — Telehealth: Payer: Self-pay

## 2020-02-01 NOTE — Progress Notes (Signed)
I connected by phone with Festus Holts and/or patient's caregiver on 02/01/2020 at 10:47 AM to discuss the potential vaccination through our Homebound vaccination initiative.   Prevaccination Checklist for COVID-19 Vaccines  1.  Are you feeling sick today? no  2.  Have you ever received a dose of a COVID-19 vaccine?  yes      If yes, which one? Moderna   How many dose of Covid-19 vaccine have your received and dates ? 2, 06/23/2019, 07/21/2019   Check all that apply: I live in a long-term care setting. no  I have been diagnosed with a medical condition(s). Please list: no (pertinent to homebound status)  I am a first responder. no  I work in a long-term care facility, correctional facility, hospital, restaurant, retail setting, school, or other setting with high exposure to the public. no  4. Do you have a health condition or are you undergoing treatment that makes you moderately or severely immunocompromised? (This would include treatment for cancer or HIV, receipt of organ transplant, immunosuppressive therapy or high-dose corticosteroids, CAR-T-cell therapy, hematopoietic cell transplant [HCT], DiGeorge syndrome or Wiskott-Aldrich syndrome)  no  5. Have you received hematopoietic cell transplant (HCT) or CAR-T-cell therapies since receiving COVID-19 vaccine? no  6.  Have you ever had an allergic reaction: (This would include a severe reaction [ e.g., anaphylaxis] that required treatment with epinephrine or EpiPen or that caused you to go to the hospital.  It would also include an allergic reaction that occurred within 4 hours that caused hives, swelling, or respiratory distress, including wheezing.) A.  A previous dose of COVID-19 vaccine. no  B.  A vaccine or injectable therapy that contains multiple components, one of which is a COVID-19 vaccine component, but it is not known which component elicited the immediate reaction. no  C.  Are you allergic to polyethylene glycol? no  D. Are you  allergic to Polysorbate, which is found in some vaccines, film coated tablets and intravenous steroids?  no   7.  Have you ever had an allergic reaction to another vaccine (other than COVID-19 vaccine) or an injectable medication? (This would include a severe reaction [ e.g., anaphylaxis] that required treatment with epinephrine or EpiPen or that caused you to go to the hospital.  It would also include an allergic reaction that occurred within 4 hours that caused hives, swelling, or respiratory distress, including wheezing.)  no   8.  Have you ever had a severe allergic reaction (e.g., anaphylaxis) to something other than a component of the COVID-19 vaccine, or any vaccine or injectable medication?  This would include food, pet, venom, environmental, or oral medication allergies.  no   Check all that apply to you:  Am a female between ages 23 and 63 years old  no  Women 31 through 71 years of age can receive any FDA-authorized or -approved COVID-19 vaccine. However, they should be informed of the rare but increased risk of thrombosis with thrombocytopenia syndrome (TTS) after receipt of the [employer] Vaccine and the availability of other FDA-authorized and -approved COVID-19 vaccines. People who had TTS after a first dose of Janssen vaccine should not receive a subsequent dose of Janssen product    Am a female between ages 56 and 36 years old  no Males 5 through 71 years of age may receive the correct formulation of Pfizer-BioNTech COVID-19 vaccine. Males 18 and older can receive any FDA-authorized or -approved vaccine. However, people receiving an mRNA COVID-19 vaccine, especially males  27 through 71 years of age and their parents/legal representative (when relevant), should be informed of the risk of developing myocarditis (an inflammation of the heart muscle) or pericarditis (inflammation of the lining around the heart) after receipt of an mRNA vaccine. The risk of developing either myocarditis or  pericarditis after vaccination is low, and lower than the risk of myocarditis associated with SARS-CoV-2 infection in adolescents and adults. Vaccine recipients should be counseled about the need to seek care if symptoms of myocarditis or pericarditis develop after vaccination     Have a history of myocarditis or pericarditis  no Myocarditis or pericarditis after receipt of the first dose of an mRNA COVID-19 vaccine series but before administration of the second dose  Experts advise that people who develop myocarditis or pericarditis after a dose of an mRNA COVID-19 vaccine not receive a subsequent dose of any COVID-19 vaccine, until additional safety data are available.  Administration of a subsequent dose of COVID-19 vaccine before safety data are available can be considered in certain circumstances after the episode of myocarditis or pericarditis has completely resolved. Until additional data are available, some experts recommend a [employer] COVID-19 vaccine be considered instead of an mRNA COVID-19 vaccine. Decisions about proceeding with a subsequent dose should include a conversation between the patient, their parent/legal representative (when relevant), and their clinical team, which may include a cardiologist.    Have been treated with monoclonal antibodies or convalescent serum to prevent or treat COVID-19  no Vaccination should be offered to people regardless of history of prior symptomatic or asymptomatic SARS-CoV-2 infection. There is no recommended minimal interval between infection and vaccination.  However, vaccination should be deferred if a patient received monoclonal antibodies or convalescent serum as treatment for COVID-19 or for post-exposure prophylaxis. This is a precautionary measure until additional information becomes available, to avoid interference of the antibody treatment with vaccine-induced immune responses.  Defer COVID-19 vaccination for 30 days when a passive antibody  product was used for post-exposure prophylaxis.  Defer COVID-19 vaccination for 90 days when a passive antibody product was used to treat COVID-19.     Diagnosed with Multisystem Inflammatory Syndrome (MIS-C or MIS-A) after a COVID-19 infection  no It is unknown if people with a history of MIS-C or MIS-A are at risk for a dysregulated immune response to COVID-19 vaccination.  People with a history of MIS-C or MIS-A may choose to be vaccinated. Considerations for vaccination may include:   Clinical recovery from MIS-C or MIS-A, including return to normal cardiac function   Personal risk of severe acute COVID-19 (e.g., age, underlying conditions)   High or substantial community transmission of SARS-CoV-2 and personal increased risk of reinfection.   Timing of any immunomodulatory therapies (general best practice guidelines for immunization can be consulted for more information [email or web address])   It has been 90 days or more since their diagnosis of MIS-C   Onset of MIS-C occurred before any COVID-19 vaccination   A conversation between the patient, their guardian(s), and their clinical team or a specialist may assist with COVID-19 vaccination decisions. Healthcare providers and health departments may also request a consultation from the [facility] at [employer]pl [email or web address]/cisa/index.[email or web address].     Have a bleeding disorder  no Take a blood thinner  no As with all vaccines, any COVID-19 vaccine product may be given to these patients, if a physician familiar with the patient's bleeding risk determines that the vaccine can be administered intramuscularly with reasonable safety.  ACIP recommends  the following technique for intramuscular vaccination in patients with bleeding disorders or taking blood thinners: a fine-gauge needle (23-gauge or smaller caliber) should be used for the  vaccination, followed by firm pressure on the site, without rubbing, for at least 2 minutes.  People who regularly take aspirin or anticoagulants as part of their routine medications do not need to stop these medications prior to receipt of any COVID-19 vaccine.    Have a history of heparin-induced thrombocytopenia (HIT)  no Although the etiology of TTS associated with the [employer] COVID-19 vaccine is unclear, it appears to be similar to another rare immune-mediated syndrome, heparin-induced thrombocytopenia (HIT). People with a history of an episode of an immune-mediated syndrome characterized by thrombosis and thrombocytopenia, such as HIT, should be offered a currently FDA-approved or FDA-authorized mRNA COVID-19 vaccine if it has been ?90 days since their TTS resolved. After 90 days, patients may be vaccinated with any currently FDA-approved or FDA-authorized COVID-19 vaccine, including Janssen COVID-19 Vaccine. However, people who developed TTS after their initial [employer] vaccine should not receive a Janssen booster dose.  Experts believe the following factors do not make people more susceptible to TTS after receipt of the [employer]. People with these conditions can be vaccinated with any FDA-authorized or - approved COVID-19 vaccine, including the [employer] COVID-19 Vaccine:   A prior history of venous thromboembolism   Risk factors for venous thromboembolism (e.g., inherited or acquired thrombophilia including Factor V Leiden; prothrombin gene 20210A mutation; antiphospholipid syndrome; protein C, protein S or antithrombin deficiency   A prior history of other types of thromboses not associated with thrombocytopenia   Pregnancy, post-partum status, or receipt of hormonal contraceptives (e.g., combined oral contraceptives, patch, ring)   Additional recipient education materials can be found at [employer] vaccines/safety/JJUpdate.html.    Am currently pregnant  or breastfeeding  no Vaccination is recommended for all people aged 29 years and older, including people that are:   Pregnant   Breastfeeding   Trying to get pregnant now or who might become pregnant in the future   Pregnant, breastfeeding, and post-partum people 76 through 71 years of age should be aware of the rare risk of TTS after receipt of the [employer] COVID-19 Vaccine and the availability of other FDA-authorized or -approved COVID-19 vaccines (i.e., mRNA vaccines).    Have received dermal fillers  no FDA-authorized or -approved COVID-19 vaccines can be administered to people who have received injectable dermal fillers who have no contraindications for vaccination.  Infrequently, these people might experience temporary swelling at or near the site of filler injection (usually the face or lips) following administration of a dose of an mRNA COVID-19 vaccine. These people should be advised to contact their healthcare provider if swelling develops at or near the site of dermal filler following vaccination.     Have a history of Guillain-Barr Syndrome (GBS)  no People with a history of GBS can receive any FDA-authorized or -approved COVID-19 vaccine. However, given the possible association between the [employer] and an increased risk of GBS, a patient with a history of GBS and their clinical team should discuss the availability of mRNA vaccines to offer protection against COVID-19. The highest risk has been observed in men aged 50-64 years with symptoms of GBS beginning within 42 days after [employer] COVID-19 vaccination.  People who had GBS after receiving Janssen vaccine should be made aware of the option to receive an mRNA COVID-19 vaccine booster at least 2 months (8 weeks) after the [employer]  dose. However, [employer] vaccine may be used as a booster, particularly if GBS occurred more than 42 days after vaccination or was related to a non-vaccine factor. Prior to booster vaccination, a  conversation between the patient and their clinical team may assist with decisions about use of a COVID-19 booster dose, including the timing of administration     Postvaccination Observation Times for People without Contraindications to Covid 19 Vaccination.  30 minutes:  People with a history of: A contraindication to another type of COVID-19 vaccine product (i.e., mRNA or viral vector COVID-19 vaccines)   Immediate (within 4 hours of exposure) non-severe allergic reaction to a COVID-19 vaccine or injectable therapies   Anaphylaxis due to any cause   Immediate allergic reaction of any severity to a non-COVID-19 vaccine   15 minutes: All other people  This patient is a 70 y.o. female that meets the FDA criteria to receive homebound vaccination. Patient or parent/caregiver understands they have the option to accept or refuse homebound vaccination.  Patient passed the pre-screening checklist and would like to proceed with homebound vaccination.  Based on questionnaire above, I recommend the patient be observed for 15 minutes.  There are an estimated #1 other household members/caregivers who are also interested in receiving the vaccine.    The patient has been confirmed homebound and eligible for homebound vaccination with the considerations outlined above. I will send the patient's information to our scheduling team who will reach out to schedule the patient and potential caregiver/family members for homebound vaccination.    Skip Mayer 02/01/2020 10:47 AM

## 2020-02-01 NOTE — Telephone Encounter (Signed)
I connected by phone with Festus Holts and/or patient's caregiver on 02/01/2020 at 10:37 AM to discuss the potential vaccination through our Homebound vaccination initiative.   Prevaccination Checklist for COVID-19 Vaccines  1.  Are you feeling sick today? no  2.  Have you ever received a dose of a COVID-19 vaccine?  yes      If yes, which one? Moderna  How many dose of Covid-19 vaccine have your received and dates ? 2 doses 06/23/2019 and 07/21/2019   Check all that apply: I live in a long-term care setting .no  I have been diagnosed with a medical condition(s). 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  (pertinent to homebound status)  I am a first responder. no  I work in a long-term care facility, correctional facility, hospital, restaurant, retail setting, school, or other setting with high exposure to the public. no  4. Do you have a health condition or are you undergoing treatment that makes you moderately or severely immunocompromised? (This would include treatment for cancer or HIV, receipt of organ transplant, immunosuppressive therapy or high-dose corticosteroids, CAR-T-cell therapy, hematopoietic cell transplant [HCT], DiGeorge syndrome or Wiskott-Aldrich syndrome)  no  5. Have you received hematopoietic cell transplant (HCT) or CAR-T-cell therapies since receiving COVID-19 vaccine? no  6.  Have you ever had an allergic reaction: (This would include a severe reaction [ e.g., anaphylaxis] that required treatment with epinephrine or EpiPen or that caused you to go to the hospital.  It would also include an allergic reaction that occurred within 4 hours that caused hives, swelling, or respiratory distress, including wheezing.) A.  A previous dose of COVID-19 vaccine. no B.  A vaccine or injectable therapy that contains multiple components, one of which is a COVID-19 vaccine component, but it is not known which component elicited the immediate  reaction. no C.  Are you allergic to polyethylene glycol? no  D. Are you allergic to Polysorbate, which is found in some vaccines, film coated tablets and intravenous steroids?  no   7.  Have you ever had an allergic reaction to another vaccine (other than COVID-19 vaccine) or an injectable medication? (This would include a severe reaction [ e.g., anaphylaxis] that required treatment with epinephrine or EpiPen or that caused you to go to the hospital.  It would also include an allergic reaction that occurred within 4 hours that caused hives, swelling, or respiratory distress, including wheezing.)  no  8.  Have you ever had a severe allergic reaction (e.g., anaphylaxis) to something other than a component of the COVID-19 vaccine, or any vaccine or injectable medication?  This would include food, pet, venom, environmental, or oral medication allergies.  no   Check all that apply to you:  Am a female between ages 69 and 38 years old  no  Women 54 through 71 years of age can receive any FDA-authorized or -approved COVID-19 vaccine. However, they should be informed of the rare but increased risk of thrombosis with thrombocytopenia syndrome (TTS) after receipt of the [employer] Vaccine and the availability of other FDA-authorized and -approved COVID-19 vaccines. People who had TTS after a first dose of Janssen vaccine should not receive a subsequent dose of Janssen product    Am a female between ages 18 and 39 years old  no Males 5 through 70 years of age may receive the correct formulation of Pfizer-BioNTech COVID-19 vaccine. Males 18 and older can receive any FDA-authorized or -approved vaccine.  However, people receiving an mRNA COVID-19 vaccine, especially males 17 through 71 years of age and their parents/legal representative (when relevant), should be informed of the risk of developing myocarditis (an inflammation of the heart muscle) or pericarditis (inflammation of the lining around the heart)  after receipt of an mRNA vaccine. The risk of developing either myocarditis or pericarditis after vaccination is low, and lower than the risk of myocarditis associated with SARS-CoV-2 infection in adolescents and adults. Vaccine recipients should be counseled about the need to seek care if symptoms of myocarditis or pericarditis develop after vaccination     Have a history of myocarditis or pericarditis  no Myocarditis or pericarditis after receipt of the first dose of an mRNA COVID-19 vaccine series but before administration of the second dose  Experts advise that people who develop myocarditis or pericarditis after a dose of an mRNA COVID-19 vaccine not receive a subsequent dose of any COVID-19 vaccine, until additional safety data are available.  Administration of a subsequent dose of COVID-19 vaccine before safety data are available can be considered in certain circumstances after the episode of myocarditis or pericarditis has completely resolved. Until additional data are available, some experts recommend a Alphonsa Overall COVID-19 vaccine be considered instead of an mRNA COVID-19 vaccine. Decisions about proceeding with a subsequent dose should include a conversation between the patient, their parent/legal representative (when relevant), and their clinical team, which may include a cardiologist.    Have been treated with monoclonal antibodies or convalescent serum to prevent or treat COVID-19  no Vaccination should be offered to people regardless of history of prior symptomatic or asymptomatic SARS-CoV-2 infection. There is no recommended minimal interval between infection and vaccination.  However, vaccination should be deferred if a patient received monoclonal antibodies or convalescent serum as treatment for COVID-19 or for post-exposure prophylaxis. This is a precautionary measure until additional information becomes available, to avoid interference of the antibody treatment with vaccine-induced immune  responses.  Defer COVID-19 vaccination for 30 days when a passive antibody product was used for post-exposure prophylaxis.  Defer COVID-19 vaccination for 90 days when a passive antibody product was used to treat COVID-19.     Diagnosed with Multisystem Inflammatory Syndrome (MIS-C or MIS-A) after a COVID-19 infection  no It is unknown if people with a history of MIS-C or MIS-A are at risk for a dysregulated immune response to COVID-19 vaccination.  People with a history of MIS-C or MIS-A may choose to be vaccinated. Considerations for vaccination may include:   Clinical recovery from MIS-C or MIS-A, including return to normal cardiac function   Personal risk of severe acute COVID-19 (e.g., age, underlying conditions)   High or substantial community transmission of SARS-CoV-2 and personal increased risk of reinfection.   Timing of any immunomodulatory therapies (general best practice guidelines for immunization can be consulted for more information [email or web address])   It has been 90 days or more since their diagnosis of MIS-C   Onset of MIS-C occurred before any COVID-19 vaccination   A conversation between the patient, their guardian(s), and their clinical team or a specialist may assist with COVID-19 vaccination decisions. Healthcare providers and health departments may also request a consultation from the [place] at [email or web address].     Have a bleeding disorder  no Take a blood thinner  no As with all vaccines, any COVID-19 vaccine product may be given to these patients, if a physician familiar with the patient's bleeding risk determines that the vaccine can  be administered intramuscularly with reasonable safety.  ACIP recommends the following technique for intramuscular vaccination in patients with bleeding disorders or taking blood thinners: a  fine-gauge needle (23-gauge or smaller caliber) should be used for the vaccination, followed by firm pressure on the site, without rubbing, for at least 2 minutes.  People who regularly take aspirin or anticoagulants as part of their routine medications do not need to stop these medications prior to receipt of any COVID-19 vaccine.    Have a history of heparin-induced thrombocytopenia (HIT)  no Although the etiology of TTS associated with the Alphonsa Overall COVID-19 vaccine is unclear, it appears to be similar to another rare immune-mediated syndrome, heparin-induced thrombocytopenia (HIT). People with a history of an episode of an immune-mediated syndrome characterized by thrombosis and thrombocytopenia, such as HIT, should be offered a currently FDA-approved or FDA-authorized mRNA COVID-19 vaccine if it has been ?90 days since their TTS resolved. After 90 days, patients may be vaccinated with any currently FDA-approved or FDA-authorized COVID-19 vaccine, including Janssen COVID-19 Vaccine. However, people who developed TTS after their initial Alphonsa Overall vaccine should not receive a Janssen booster dose.  Experts believe the following factors do not make people more susceptible to TTS after receipt of the [employer]. People with these conditions can be vaccinated with any FDA-authorized or - approved COVID-19 vaccine, including the [employer] COVID-19 Vaccine:   A prior history of venous thromboembolism   Risk factors for venous thromboembolism (e.g., inherited or acquired thrombophilia including Factor V Leiden; prothrombin gene 20210A mutation; antiphospholipid syndrome; protein C, protein S or antithrombin deficiency   A prior history of other types of thromboses not associated with thrombocytopenia   Pregnancy, post-partum status, or receipt of hormonal contraceptives (e.g., combined oral contraceptives, patch, ring)   Additional recipient education materials can be found at  [email or web address]-[email or web address] vaccines/safety/JJUpdate.html.    Am currently pregnant or breastfeeding  no Vaccination is recommended for all people aged 25 years and older, including people that are:   Pregnant   Breastfeeding   Trying to get pregnant now or who might become pregnant in the future   Pregnant, breastfeeding, and post-partum people 87 through 70 years of age should be aware of the rare risk of TTS after receipt of the Alphonsa Overall COVID-19 Vaccine and the availability of other FDA-authorized or -approved COVID-19 vaccines (i.e., mRNA vaccines).    Have received dermal fillers  no FDA-authorized or -approved COVID-19 vaccines can be administered to people who have received injectable dermal fillers who have no contraindications for vaccination.  Infrequently, these people might experience temporary swelling at or near the site of filler injection (usually the face or lips) following administration of a dose of an mRNA COVID-19 vaccine. These people should be advised to contact their healthcare provider if swelling develops at or near the site of dermal filler following vaccination.     Have a history of Guillain-Barr Syndrome (GBS)  no People with a history of GBS can receive any FDA-authorized or -approved COVID-19 vaccine. However, given the possible association between the [employer] and an increased risk of GBS, a patient with a history of GBS and their clinical team should discuss the availability of mRNA vaccines to offer protection against COVID-19. The highest risk has been observed in men aged 23-64 years with symptoms of GBS beginning within 42 days after Alphonsa Overall COVID-19 vaccination.  People who had GBS after receiving Janssen vaccine should be made aware of the option to receive an mRNA COVID-19 vaccine booster  at least 2 months (8 weeks) after the Janssen dose. However, Alphonsa Overall vaccine may be used as a booster, particularly if GBS occurred more than 42 days  after vaccination or was related to a non-vaccine factor. Prior to booster vaccination, a conversation between the patient and their clinical team may assist with decisions about use of a COVID-19 booster dose, including the timing of administration     Postvaccination Observation Times for People without Contraindications to Covid 19 Vaccination.  30 minutes:  People with a history of: A contraindication to another type of COVID-19 vaccine product (i.e., mRNA or viral vector COVID-19 vaccines)   Immediate (within 4 hours of exposure) non-severe allergic reaction to a COVID-19 vaccine or injectable therapies   Anaphylaxis due to any cause   Immediate allergic reaction of any severity to a non-COVID-19 vaccine   15 minutes: All other people  This patient is a 70 y.o. female that meets the FDA criteria to receive homebound vaccination. Patient or parent/caregiver understands they have the option to accept or refuse homebound vaccination.  Patient passed the pre-screening checklist and would like to proceed with homebound vaccination.  Based on questionnaire above, I recommend the patient be observed for 15 minutes.  There are an estimated #0 other household members/caregivers who are also interested in receiving the vaccine.    The patient has been confirmed homebound and eligible for homebound vaccination with the considerations outlined above. I will send the patient's information to our scheduling team who will reach out to schedule the patient and potential caregiver/family members for homebound vaccination.    Dan Humphreys 02/01/2020 9:53 AM

## 2020-02-04 ENCOUNTER — Ambulatory Visit: Payer: Medicare Other | Admitting: Physical Therapy

## 2020-02-11 ENCOUNTER — Ambulatory Visit: Payer: Medicare Other | Admitting: Physical Therapy

## 2020-02-18 ENCOUNTER — Ambulatory Visit: Payer: Medicare Other | Admitting: Physical Therapy

## 2020-02-21 NOTE — Telephone Encounter (Signed)
02/01/2020 Pt left voice message on the [place] home bound hotline requesting Moderna booster. Contacted pt and prescreening completed, pt is now on the schedule for booster.  Dan Humphreys, BSN-RN-CCM

## 2020-02-21 NOTE — Telephone Encounter (Signed)
02/01/2020 Pt left voice message on the [facility] home bound hotline requesting Moderna booster. Contacted pt and prescreening completed, pt is now on the schedule for booster.  [name], BSN-RN-CCM 

## 2020-02-25 ENCOUNTER — Ambulatory Visit: Payer: Medicare Other | Admitting: Physical Therapy

## 2020-02-29 ENCOUNTER — Ambulatory Visit: Payer: Medicare Other

## 2020-03-03 ENCOUNTER — Ambulatory Visit: Payer: Medicare Other | Admitting: Physical Therapy

## 2020-03-10 ENCOUNTER — Encounter: Payer: Self-pay | Admitting: Physical Therapy

## 2020-03-10 ENCOUNTER — Other Ambulatory Visit: Payer: Self-pay

## 2020-03-10 ENCOUNTER — Ambulatory Visit: Payer: Medicare Other | Attending: Anesthesiology | Admitting: Physical Therapy

## 2020-03-10 DIAGNOSIS — M4126 Other idiopathic scoliosis, lumbar region: Secondary | ICD-10-CM | POA: Diagnosis present

## 2020-03-10 DIAGNOSIS — M5416 Radiculopathy, lumbar region: Secondary | ICD-10-CM | POA: Insufficient documentation

## 2020-03-10 NOTE — Therapy (Signed)
[facility] [address], [place], [address] Phone: [phone]   Fax:  [phone]  Physical Therapy Treatment  Patient Details  Name: Colleen Payne MRN: [id number] Date of Birth: Dec 09, 1950 No data recorded  Encounter Date: 03/10/2020   PT End of Session - 03/10/20 1041    Visit Number 6    Number of Visits 37    Date for PT Re-Evaluation 06/02/20    Authorization Type Medicare    Authorization Time Period 6/10    Authorization - Visit Number 6    Authorization - Number of Visits 10    PT Start Time 1030    PT Stop Time 1110    PT Time Calculation (min) 40 min    Equipment Utilized During Treatment Other (comment)    Activity Tolerance Patient tolerated treatment well;No increased pain    Behavior During Therapy WFL for tasks assessed/performed           Past Medical History:  Diagnosis Date  . Anxiety   . Atrial fibrillation ([place])   . Depression   . Diabetes mellitus without complication (HCC)    Type I   . Headache    stress. 1x/month  . Hypertension   . Insulin pump in place   . Motion sickness    cars  . MVP (mitral valve prolapse)   . Neuromuscular disorder (HCC)    leg weakness s/p back surgeries  . Thyroid disease     Past Surgical History:  Procedure Laterality Date  . BACK SURGERY  2004   3 sugeries between Sept and Nov, fusion L2-S1  . CATARACT EXTRACTION W/PHACO Left 11/23/2014   Procedure: CATARACT EXTRACTION PHACO AND INTRAOCULAR LENS PLACEMENT (IOC);  Surgeon: Leandrew Koyanagi, MD;  Location: [facility];  Service: Ophthalmology;  Laterality: Left;  DIABETIC - insulin pump    There were no vitals filed for this visit.   Subjective Assessment - 03/10/20 1037    Subjective Patient returns to PT following 2 month absence due to illness and the death of her husband. Is "more stiff" with 8/10 pain. Has not been able to complete HEP    Pertinent History Patient  is 70 yo female that complains of lumbar pain and inability to walk erect, as well as hip/abdominal pain. Patient with history of lumbar fusion (L2-S1). Xray results show: Fairly stable appearance of the levoscoliosis of the lower thoracic and of the lumbar spine.Stable lateral subluxation toward the left of L2 with respect L3. Stable partial compression of the body of L1. Patient complains of frustration with physicians, and that her symptoms are worsening over time. 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 Reported that her pain and difficulty worsened after trying to pick her husband up about 1 year ago. Patient mentioned that she has been told that she has "spinal fluid pockets that are leaking". PMH also includes DM I with insulin pump,  afib, HTN,  HLD, chronic pain syndrome, SOB.    Limitations Standing;Walking;House hold activities;Other (comment)    Diagnostic tests see exray results: Fairly stable appearance of the levoscoliosis of the lower thoracic and lumbar spine. Stable lateral subluxation toward left of L2 with respect of L3, stable partial compression of the body of L1.    Patient Stated Goals pain relief, standing erect    Pain Onset More than a month ago    Pain Onset More than a month ago  Ther-Ex -Hooklying wand flexion x20 c PVC - Bridge 2x 12 with minimal mobility, increases throughout reps.  - Prone press up x15; with prone prop 24min prior and following - Alt prone supermans 2x 12 with min encouragement for increased mobility - Prone hands behind hand scapular retraction with bilat elbow lift x12 -Standing with back to wall, trunk extension to wall2x 15x3secH (able to get close to fully upright for short periods)standing rest between sets; PT blocking R knee LE flex<>ext swings x12 each LE with cuing to prevent lumbar flex with hip ext with some carry over -Backwardwalking1.58mph for37mins,cuing forposture and  "large steps" with decent carry over. Pt able to maintain more thoracic ext with cuing to "keep eyes up"                          PT Education - 03/10/20 1041    Education Details therex form/technique    Person(s) Educated Patient    Methods Explanation;Tactile cues;Verbal cues    Comprehension Verbalized understanding;Returned demonstration;Verbal cues required            PT Short Term Goals - 12/10/19 1021      PT SHORT TERM GOAL #1   Title Patient will be adherent to initial HEP at least 3x a week to improve functional strength and balance for better safety at home.    Baseline 12/10/19 HEP given    Time 4    Period Weeks    Status New      PT SHORT TERM GOAL #2   Title Pt will increase 10MWT by at least 0.13 m/s in order to demonstrate clinically significant improvement in community ambulation    Baseline 12/10/19 0.37m/s    Time 4    Period Weeks    Status New             PT Long Term Goals - 12/10/19 1006      PT LONG TERM GOAL #1   Title Patient will be independent in bending down towards floor and picking up small object (<5 pounds) and then stand back up without increased pain to improve ability to pick up and clean up room at home    Baseline 09/25/18/20 able to pick up object without increase in pain, though she reports continued baseline pain, goal revised and achieved    Time 8    Period Weeks    Status Achieved      PT LONG TERM GOAL #2   Title Patient will increase BLE gross strength to 4+/5 as to improve functional strength for independent gait, increased standing tolerance and increased ADL ability.    Baseline 08/31/18 R/L flexion: 5/5 ER 5/5 IR 4+/4+ ABD 5/5 Ext 4+/4    Time 8    Period Weeks    Status Achieved      PT LONG TERM GOAL #3   Title Patient will report a worst pain of 5/10 with transfers of her husband to improve tolerance with ADLs     Baseline 12/10/19 Reports 10/10 back pain with bed transfers for husband     Time 8    Period Weeks    Status Revised      PT LONG TERM GOAL #4   Title Pt will decrease 5TSTS to 10sec order to demonstrate clinically significant improvement in LE strength, and reduced fall risk    Baseline 12/10/19 14sec    Time 8    Period Weeks    Status  Revised      PT LONG TERM GOAL #5   Title Pt will increase 10MWT by at least 0.13 m/s in order to demonstrate clinically significant improvement in community ambulation    Baseline 12/10/19 0.28m/s    Time --    Period Weeks    Status Deferred      PT LONG TERM GOAL #6   Title Patient will demonstrate walk speed of 1.2 to demonstrate community ambulation norm    Baseline 12/10/19 0.58m/s    Time 8    Period Weeks    Status Revised      PT LONG TERM GOAL #7   Title Patient will increase standing tolerance to 64min without increased pain in order to complete household chores/cooking    Baseline 12/10/19 needs to sit following 56mins of standing activity d/t pain    Time 8    Period Weeks    Status On-going      PT LONG TERM GOAL #8   Title Patient will demonstrate SLS of 10sec to demonstrate decreased fall risk    Baseline 12/10/19 LLE 4sec RLE 2sec    Time 8    Period Weeks    Status Revised      PT LONG TERM GOAL  #9   TITLE Patient will demonstrate 15 STS in 30sec to demonstrate age-matched increase in LE endurance    Baseline 12/10/19 13    Time 8    Period Weeks    Status Revised                 Plan - 03/10/20 1044    Clinical Impression Statement Pt returns to PT following prolonged absence d/t illness and her husband passing. PT session focused on mobility with patient able to complete all therex with some modifications needed and increased rest breaks this session. Patient is motivated throughout session with no increased pain. PT will conitnue progression as able.    Personal Factors and Comorbidities Age;Behavior Pattern;Fitness;Past/Current Experience;Comorbidity 1;Comorbidity 2;Comorbidity  3+    Comorbidities scolosis, DM1, chronic pain syndrome, HTN, afib, depression    Examination-Activity Limitations Bathing;Dressing;Transfers;Caring for Others;Carry;Squat;Lift;Bend;Bed Mobility    Examination-Participation Restrictions Yard Work;Laundry;Cleaning;Community Activity    Stability/Clinical Decision Making Evolving/Moderate complexity    Clinical Decision Making Moderate    Rehab Potential Fair    Clinical Impairments Affecting Rehab Potential comorbidities, symptom duration, curvature degree    PT Frequency 2x / week    PT Duration 8 weeks    PT Treatment/Interventions ADLs/Self Care Home Management;Aquatic Therapy;Cryotherapy;Ultrasound;Parrafin;Traction;Moist Heat;Electrical Stimulation;DME Instruction;Gait training;Stair training;Functional mobility training;Neuromuscular re-education;Balance training;Therapeutic exercise;Therapeutic activities;Patient/family education;Energy conservation;Spinal Manipulations;Joint Manipulations;Passive range of motion;Dry needling;Manual techniques    PT Next Visit Plan continue POC    PT Home Exercise Plan Added standing pec stretch in doorway to HEP today    Consulted and Agree with Plan of Care Patient           Patient will benefit from skilled therapeutic intervention in order to improve the following deficits and impairments:  Abnormal gait,Decreased balance,Decreased endurance,Decreased mobility,Difficulty walking,Hypomobility,Increased muscle spasms,Decreased range of motion,Pain,Postural dysfunction,Impaired flexibility,Increased fascial restricitons,Decreased strength,Decreased activity tolerance  Visit Diagnosis: Other idiopathic scoliosis, lumbar region - Plan: PT plan of care cert/re-cert  Radiculopathy, lumbar region - Plan: PT plan of care cert/re-cert     Problem List Patient Active Problem List   Diagnosis Date Noted  . Central sleep apnea 10/09/2016  . Hypertension 10/09/2016  . Lumbar postlaminectomy syndrome  10/09/2016  . Lumbar radiculopathy 10/09/2016  .  Diabetic polyneuropathy ([place]) 10/07/2016  . Right lumbosacral radiculopathy 10/07/2016  . Healthcare maintenance 08/26/2016  . DKA (diabetic ketoacidoses) 06/14/2016  . Atypical chest pain 02/23/2016  . SOB (shortness of breath) 02/23/2016  . Pain medication agreement signed 01/13/2015  . Acquired hypothyroidism 05/30/2014  . Frontal sinusitis 12/17/2013  . Atrial fibrillation ([place]) 11/29/2013  . Major depression in remission ([place]) 11/20/2013  . 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 Chronic pain syndrome 08/27/2013  . Back pain 07/17/2013  . HTN (hypertension), benign 07/17/2013  . Hyperlipidemia 07/17/2013  . Pancreatic insufficiency 07/17/2013  . Chronic postoperative pain 01/01/2013  . Long term current use of opiate analgesic 11/04/2012  . Hypothyroidism 08/25/2012  . Radiculopathy of leg 05/01/2011   Durwin Reges DPT Durwin Reges 03/10/2020, 11:07 AM  [facility] [address], [place], [address] Phone: [phone]   Fax:  [phone]  Name: Colleen Payne MRN: [id number] Date of Birth: 1950/03/14

## 2020-03-17 ENCOUNTER — Ambulatory Visit: Payer: Medicare Other | Admitting: Physical Therapy

## 2020-03-17 ENCOUNTER — Other Ambulatory Visit: Payer: Self-pay

## 2020-03-17 ENCOUNTER — Encounter: Payer: Self-pay | Admitting: Physical Therapy

## 2020-03-17 DIAGNOSIS — M4126 Other idiopathic scoliosis, lumbar region: Secondary | ICD-10-CM | POA: Diagnosis not present

## 2020-03-17 DIAGNOSIS — M5416 Radiculopathy, lumbar region: Secondary | ICD-10-CM

## 2020-03-17 NOTE — Therapy (Signed)
[facility] [address] [place], [address] Phone: [phone]   Fax:  [phone]  Physical Therapy Treatment  Patient Details  Name: Colleen Payne MRN: [id number] Date of Birth: [birth year] No data recorded  Encounter Date: 03/17/2020   PT End of Session - 03/17/20 1056    Visit Number 7    Number of Visits 37    Date for PT Re-Evaluation 06/02/20    Authorization Type Medicare    Authorization Time Period 7/10    Authorization - Visit Number 7    Authorization - Number of Visits 10    PT Start Time 6962    PT Stop Time 1130    PT Time Calculation (min) 45 min    Equipment Utilized During Treatment Other (comment)    Activity Tolerance Patient tolerated treatment well;No increased pain    Behavior During Therapy WFL for tasks assessed/performed           Past Medical History:  Diagnosis Date  . Anxiety   . Atrial fibrillation ([place])   . Depression   . Diabetes mellitus without complication (HCC)    Type I   . Headache    stress. 1x/month  . Hypertension   . Insulin pump in place   . Motion sickness    cars  . MVP (mitral valve prolapse)   . Neuromuscular disorder (HCC)    leg weakness s/p back surgeries  . Thyroid disease     Past Surgical History:  Procedure Laterality Date  . BACK SURGERY  2004   3 sugeries between Sept and Nov, fusion L2-S1  . CATARACT EXTRACTION W/PHACO Left 11/23/2014   Procedure: CATARACT EXTRACTION PHACO AND INTRAOCULAR LENS PLACEMENT (IOC);  Surgeon: Leandrew Koyanagi, MD;  Location: [facility];  Service: Ophthalmology;  Laterality: Left;  DIABETIC - insulin pump    There were no vitals filed for this visit.   Subjective Assessment - 03/17/20 1053    Subjective Patient reports she felt much better after PT session. Reports no pain on arrival, for first time in a long time. Reports she thinks her pain is improved d/t not caring for her husband.  Has not been moving around as much and completing HEP d/t being tired.    Pertinent History Patient is 70 yo female that complains of lumbar pain and inability to walk erect, as well as hip/abdominal pain. Patient with history of lumbar fusion (L2-S1). Xray results show: Fairly stable appearance of the levoscoliosis of the lower thoracic and of the lumbar spine.Stable lateral subluxation toward the left of L2 with respect L3. Stable partial compression of the body of L1. Patient complains of frustration with physicians, and that her symptoms are worsening over time. 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 Reported that her pain and difficulty worsened after trying to pick her husband up about 1 year ago. Patient mentioned that she has been told that she has "spinal fluid pockets that are leaking". PMH also includes DM I with insulin pump,  afib, HTN,  HLD, chronic pain syndrome, SOB.    Limitations Standing;Walking;House hold activities;Other (comment)    Diagnostic tests see exray results: Fairly stable appearance of the levoscoliosis of the lower thoracic and lumbar spine. Stable lateral subluxation toward left of L2 with respect of L3, stable partial compression of the body of L1.    Patient Stated Goals pain relief, standing erect  Pain Onset More than a month ago    Pain Onset More than a month ago             Ther-Ex -Hooklying wand flexion x20 c PVC - Bridge 2x 12 with minimal mobility, increases throughout reps.  - Prone press up x15; with prone prop 32min prior and following - Prone hands behind hand scapular retraction with bilat elbow lift x12 - Birddog 2x 12 with childspose following each set 30sec hold; min stabilization occasionally from PT - Half kneeling overhead flex with yellow tball x12 each LE forward -Standing with back to wall, trunk extension to wallx15x3secH (able to get close to fully upright for short periods)standing  rest between sets; PT blocking R knee LE flex<>ext swings x12 each LE with cuing to prevent lumbar flex with hip ext with some carry over -Backwardwalking1.93mph for16mins,cuing forposture and "large steps" with decent carry over. Pt able to maintain more thoracic ext with cuing to "keep eyes up"                         PT Education - 03/17/20 1055    Education Details therex form/technique    Person(s) Educated Patient    Methods Explanation;Demonstration;Verbal cues    Comprehension Verbalized understanding;Returned demonstration;Verbal cues required            PT Short Term Goals - 12/10/19 1021      PT SHORT TERM GOAL #1   Title Patient will be adherent to initial HEP at least 3x a week to improve functional strength and balance for better safety at home.    Baseline 12/10/19 HEP given    Time 4    Period Weeks    Status New      PT SHORT TERM GOAL #2   Title Pt will increase 10MWT by at least 0.13 m/s in order to demonstrate clinically significant improvement in community ambulation    Baseline 12/10/19 0.43m/s    Time 4    Period Weeks    Status New             PT Long Term Goals - 12/10/19 1006      PT LONG TERM GOAL #1   Title Patient will be independent in bending down towards floor and picking up small object (<5 pounds) and then stand back up without increased pain to improve ability to pick up and clean up room at home    Baseline 09/25/18/20 able to pick up object without increase in pain, though she reports continued baseline pain, goal revised and achieved    Time 8    Period Weeks    Status Achieved      PT LONG TERM GOAL #2   Title Patient will increase BLE gross strength to 4+/5 as to improve functional strength for independent gait, increased standing tolerance and increased ADL ability.    Baseline 08/31/18 R/L flexion: 5/5 ER 5/5 IR 4+/4+ ABD 5/5 Ext 4+/4    Time 8    Period Weeks    Status Achieved      PT LONG TERM GOAL  #3   Title Patient will report a worst pain of 5/10 with transfers of her husband to improve tolerance with ADLs     Baseline 12/10/19 Reports 10/10 back pain with bed transfers for husband    Time 8    Period Weeks    Status Revised      PT LONG TERM GOAL #4  Title Pt will decrease 5TSTS to 10sec order to demonstrate clinically significant improvement in LE strength, and reduced fall risk    Baseline 12/10/19 14sec    Time 8    Period Weeks    Status Revised      PT LONG TERM GOAL #5   Title Pt will increase 10MWT by at least 0.13 m/s in order to demonstrate clinically significant improvement in community ambulation    Baseline 12/10/19 0.71m/s    Time --    Period Weeks    Status Deferred      PT LONG TERM GOAL #6   Title Patient will demonstrate walk speed of 1.2 to demonstrate community ambulation norm    Baseline 12/10/19 0.73m/s    Time 8    Period Weeks    Status Revised      PT LONG TERM GOAL #7   Title Patient will increase standing tolerance to 63min without increased pain in order to complete household chores/cooking    Baseline 12/10/19 needs to sit following 70mins of standing activity d/t pain    Time 8    Period Weeks    Status On-going      PT LONG TERM GOAL #8   Title Patient will demonstrate SLS of 10sec to demonstrate decreased fall risk    Baseline 12/10/19 LLE 4sec RLE 2sec    Time 8    Period Weeks    Status Revised      PT LONG TERM GOAL  #9   TITLE Patient will demonstrate 15 STS in 30sec to demonstrate age-matched increase in LE endurance    Baseline 12/10/19 13    Time 8    Period Weeks    Status Revised                 Plan - 03/17/20 1058    Clinical Impression Statement Pt is demonstrating improvements in pain management following return to PT. PT continued therex progression for increased upright posture and strength and mobility with success. Patient is motivated throughout session, and is able to complete all therex  progressions with proper technique following multimodal cuing. PT will continue progression as able.    Personal Factors and Comorbidities Age;Behavior Pattern;Fitness;Past/Current Experience;Comorbidity 1;Comorbidity 2;Comorbidity 3+    Comorbidities scolosis, DM1, chronic pain syndrome, HTN, afib, depression    Examination-Activity Limitations Bathing;Dressing;Transfers;Caring for Others;Carry;Squat;Lift;Bend;Bed Mobility    Examination-Participation Restrictions Yard Work;Laundry;Cleaning;Community Activity    Stability/Clinical Decision Making Evolving/Moderate complexity    Clinical Decision Making Moderate    Rehab Potential Fair    Clinical Impairments Affecting Rehab Potential comorbidities, symptom duration, curvature degree    PT Frequency 2x / week    PT Duration 8 weeks    PT Treatment/Interventions ADLs/Self Care Home Management;Aquatic Therapy;Cryotherapy;Ultrasound;Parrafin;Traction;Moist Heat;Electrical Stimulation;DME Instruction;Gait training;Stair training;Functional mobility training;Neuromuscular re-education;Balance training;Therapeutic exercise;Therapeutic activities;Patient/family education;Energy conservation;Spinal Manipulations;Joint Manipulations;Passive range of motion;Dry needling;Manual techniques    PT Next Visit Plan continue POC    PT Home Exercise Plan Added standing pec stretch in doorway to HEP today    Consulted and Agree with Plan of Care Patient           Patient will benefit from skilled therapeutic intervention in order to improve the following deficits and impairments:  Abnormal gait,Decreased balance,Decreased endurance,Decreased mobility,Difficulty walking,Hypomobility,Increased muscle spasms,Decreased range of motion,Pain,Postural dysfunction,Impaired flexibility,Increased fascial restricitons,Decreased strength,Decreased activity tolerance  Visit Diagnosis: Other idiopathic scoliosis, lumbar region  Radiculopathy, lumbar region     Problem  List Patient Active Problem List  Diagnosis Date Noted  . Central sleep apnea 10/09/2016  . Hypertension 10/09/2016  . Lumbar postlaminectomy syndrome 10/09/2016  . Lumbar radiculopathy 10/09/2016  . Diabetic polyneuropathy ([place]) 10/07/2016  . Right lumbosacral radiculopathy 10/07/2016  . Healthcare maintenance 08/26/2016  . DKA (diabetic ketoacidoses) 06/14/2016  . Atypical chest pain 02/23/2016  . SOB (shortness of breath) 02/23/2016  . Pain medication agreement signed 01/13/2015  . Acquired hypothyroidism 05/30/2014  . Frontal sinusitis 12/17/2013  . Atrial fibrillation ([place]) 11/29/2013  . Major depression in remission ([place]) 11/20/2013  . 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 Chronic pain syndrome 08/27/2013  . Back pain 07/17/2013  . HTN (hypertension), benign 07/17/2013  . Hyperlipidemia 07/17/2013  . Pancreatic insufficiency 07/17/2013  . Chronic postoperative pain 01/01/2013  . Long term current use of opiate analgesic 11/04/2012  . Hypothyroidism 08/25/2012  . Radiculopathy of leg 05/01/2011   Durwin Reges DPT Durwin Reges 03/17/2020, 11:32 AM  [facility] [address], [place], [address] Phone: [phone]   Fax:  [phone]  Name: Colleen Payne MRN: [id number] Date of Birth: 1950/06/02

## 2020-03-24 ENCOUNTER — Ambulatory Visit: Payer: Medicare Other | Attending: Anesthesiology | Admitting: Physical Therapy

## 2020-03-24 ENCOUNTER — Encounter: Payer: Self-pay | Admitting: Physical Therapy

## 2020-03-24 ENCOUNTER — Other Ambulatory Visit: Payer: Self-pay

## 2020-03-24 DIAGNOSIS — M4126 Other idiopathic scoliosis, lumbar region: Secondary | ICD-10-CM | POA: Insufficient documentation

## 2020-03-24 DIAGNOSIS — M5416 Radiculopathy, lumbar region: Secondary | ICD-10-CM

## 2020-03-24 NOTE — Therapy (Signed)
[facility] [address] [place], [address] Phone: [phone]   Fax:  [phone]  Physical Therapy Treatment  Patient Details  Name: Colleen Payne MRN: [id number] Date of Birth: 07-01-1950 No data recorded  Encounter Date: 03/24/2020   PT End of Session - 03/24/20 1035    Visit Number 8    Number of Visits 37    Date for PT Re-Evaluation 06/02/20    Authorization Type Medicare    Authorization Time Period 8/10    Authorization - Visit Number 8    Authorization - Number of Visits 10    PT Start Time 2353    PT Stop Time 1107    PT Time Calculation (min) 38 min    Equipment Utilized During Treatment Other (comment)    Activity Tolerance Patient tolerated treatment well;No increased pain    Behavior During Therapy WFL for tasks assessed/performed           Past Medical History:  Diagnosis Date  . Anxiety   . Atrial fibrillation ([place])   . Depression   . Diabetes mellitus without complication (HCC)    Type I   . Headache    stress. 1x/month  . Hypertension   . Insulin pump in place   . Motion sickness    cars  . MVP (mitral valve prolapse)   . Neuromuscular disorder (HCC)    leg weakness s/p back surgeries  . Thyroid disease     Past Surgical History:  Procedure Laterality Date  . BACK SURGERY  2004   3 sugeries between Sept and Nov, fusion L2-S1  . CATARACT EXTRACTION W/PHACO Left 11/23/2014   Procedure: CATARACT EXTRACTION PHACO AND INTRAOCULAR LENS PLACEMENT (IOC);  Surgeon: Leandrew Koyanagi, MD;  Location: [facility];  Service: Ophthalmology;  Laterality: Left;  DIABETIC - insulin pump    There were no vitals filed for this visit.   Subjective Assessment - 03/24/20 1030    Subjective Patient reports she "got lost" on the way here, and that this happened yesterday as well. She feels like "she is losing her mind". Arrives 68min late. Minimal pain.    Pertinent History  Patient is 70 yo female that complains of lumbar pain and inability to walk erect, as well as hip/abdominal pain. Patient with history of lumbar fusion (L2-S1). Xray results show: Fairly stable appearance of the levoscoliosis of the lower thoracic and of the lumbar spine.Stable lateral subluxation toward the left of L2 with respect L3. Stable partial compression of the body of L1. Patient complains of frustration with physicians, and that her symptoms are worsening over time. 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 Reported that her pain and difficulty worsened after trying to pick her husband up about 1 year ago. Patient mentioned that she has been told that she has "spinal fluid pockets that are leaking". PMH also includes DM I with insulin pump,  afib, HTN,  HLD, chronic pain syndrome, SOB.    Limitations Standing;Walking;House hold activities;Other (comment)    Diagnostic tests see exray results: Fairly stable appearance of the levoscoliosis of the lower thoracic and lumbar spine. Stable lateral subluxation toward left of L2 with respect of L3, stable partial compression of the body of L1.    Patient Stated Goals pain relief, standing erect    Pain Onset More than a month ago    Pain Onset More than a month ago  Ther-Ex -Hooklying wand flexion x20 c PVC - Prone press up x15; with prone prop 75minprior andfollowing - Prone hands behind hand scapular retraction with bilat elbow lift 2x 12 - Birddog 2x 12 with childspose following each set 30sec hold; min stabilization occasionally from PT -Bridge 2x 12 with cuing for max available lift LE flex<>ext swings x12 each LE with cuing to prevent lumbar flex with hip ext with some carry over -Backwardwalking1.31mph for44mins,cuing forposture and "large steps" with decent carry over. Pt able to maintain more thoracic ext with cuing to "keep eyes  up"                          PT Education - 03/24/20 1034    Education Details therex form/technique    Person(s) Educated Patient    Methods Explanation;Demonstration;Tactile cues    Comprehension Verbal cues required;Tactile cues required;Returned demonstration;Verbalized understanding            PT Short Term Goals - 12/10/19 1021      PT SHORT TERM GOAL #1   Title Patient will be adherent to initial HEP at least 3x a week to improve functional strength and balance for better safety at home.    Baseline 12/10/19 HEP given    Time 4    Period Weeks    Status New      PT SHORT TERM GOAL #2   Title Pt will increase 10MWT by at least 0.13 m/s in order to demonstrate clinically significant improvement in community ambulation    Baseline 12/10/19 0.48m/s    Time 4    Period Weeks    Status New             PT Long Term Goals - 12/10/19 1006      PT LONG TERM GOAL #1   Title Patient will be independent in bending down towards floor and picking up small object (<5 pounds) and then stand back up without increased pain to improve ability to pick up and clean up room at home    Baseline 09/25/18/20 able to pick up object without increase in pain, though she reports continued baseline pain, goal revised and achieved    Time 8    Period Weeks    Status Achieved      PT LONG TERM GOAL #2   Title Patient will increase BLE gross strength to 4+/5 as to improve functional strength for independent gait, increased standing tolerance and increased ADL ability.    Baseline 08/31/18 R/L flexion: 5/5 ER 5/5 IR 4+/4+ ABD 5/5 Ext 4+/4    Time 8    Period Weeks    Status Achieved      PT LONG TERM GOAL #3   Title Patient will report a worst pain of 5/10 with transfers of her husband to improve tolerance with ADLs     Baseline 12/10/19 Reports 10/10 back pain with bed transfers for husband    Time 8    Period Weeks    Status Revised      PT LONG TERM GOAL #4   Title  Pt will decrease 5TSTS to 10sec order to demonstrate clinically significant improvement in LE strength, and reduced fall risk    Baseline 12/10/19 14sec    Time 8    Period Weeks    Status Revised      PT LONG TERM GOAL #5   Title Pt will increase 10MWT by at least 0.13 m/s in order  to demonstrate clinically significant improvement in community ambulation    Baseline 12/10/19 0.16m/s    Time --    Period Weeks    Status Deferred      PT LONG TERM GOAL #6   Title Patient will demonstrate walk speed of 1.2 to demonstrate community ambulation norm    Baseline 12/10/19 0.78m/s    Time 8    Period Weeks    Status Revised      PT LONG TERM GOAL #7   Title Patient will increase standing tolerance to 29min without increased pain in order to complete household chores/cooking    Baseline 12/10/19 needs to sit following 52mins of standing activity d/t pain    Time 8    Period Weeks    Status On-going      PT LONG TERM GOAL #8   Title Patient will demonstrate SLS of 10sec to demonstrate decreased fall risk    Baseline 12/10/19 LLE 4sec RLE 2sec    Time 8    Period Weeks    Status Revised      PT LONG TERM GOAL  #9   TITLE Patient will demonstrate 15 STS in 30sec to demonstrate age-matched increase in LE endurance    Baseline 12/10/19 13    Time 8    Period Weeks    Status Revised                 Plan - 03/24/20 1057    Clinical Impression Statement PT continued therex progression for increased extension with success. Pt requires increase rest breaks this session d/t fatigue. Pt is able to comply with all cuing for proper technique, completing all planned therex with increased rest. Patient is motivated without increased pain throughout session. PT will continue progression as able.    Personal Factors and Comorbidities Age;Behavior Pattern;Fitness;Past/Current Experience;Comorbidity 1;Comorbidity 2;Comorbidity 3+    Comorbidities scolosis, DM1, chronic pain syndrome, HTN,  afib, depression    Examination-Activity Limitations Bathing;Dressing;Transfers;Caring for Others;Carry;Squat;Lift;Bend;Bed Mobility    Examination-Participation Restrictions Yard Work;Laundry;Cleaning;Community Activity    Stability/Clinical Decision Making Evolving/Moderate complexity    Clinical Decision Making Moderate    Rehab Potential Fair    Clinical Impairments Affecting Rehab Potential comorbidities, symptom duration, curvature degree    PT Frequency 2x / week    PT Duration 8 weeks    PT Treatment/Interventions ADLs/Self Care Home Management;Aquatic Therapy;Cryotherapy;Ultrasound;Parrafin;Traction;Moist Heat;Electrical Stimulation;DME Instruction;Gait training;Stair training;Functional mobility training;Neuromuscular re-education;Balance training;Therapeutic exercise;Therapeutic activities;Patient/family education;Energy conservation;Spinal Manipulations;Joint Manipulations;Passive range of motion;Dry needling;Manual techniques    PT Next Visit Plan continue POC    PT Home Exercise Plan Added standing pec stretch in doorway to HEP today    Consulted and Agree with Plan of Care Patient           Patient will benefit from skilled therapeutic intervention in order to improve the following deficits and impairments:  Abnormal gait,Decreased balance,Decreased endurance,Decreased mobility,Difficulty walking,Hypomobility,Increased muscle spasms,Decreased range of motion,Pain,Postural dysfunction,Impaired flexibility,Increased fascial restricitons,Decreased strength,Decreased activity tolerance  Visit Diagnosis: Other idiopathic scoliosis, lumbar region  Radiculopathy, lumbar region     Problem List Patient Active Problem List   Diagnosis Date Noted  . Central sleep apnea 10/09/2016  . Hypertension 10/09/2016  . Lumbar postlaminectomy syndrome 10/09/2016  . Lumbar radiculopathy 10/09/2016  . Diabetic polyneuropathy ([place]) 10/07/2016  . Right lumbosacral radiculopathy 10/07/2016   . Healthcare maintenance 08/26/2016  . DKA (diabetic ketoacidoses) 06/14/2016  . Atypical chest pain 02/23/2016  . SOB (shortness of breath) 02/23/2016  . Pain medication agreement  signed 01/13/2015  . Acquired hypothyroidism 05/30/2014  . Frontal sinusitis 12/17/2013  . Atrial fibrillation ([place]) 11/29/2013  . Major depression in remission ([place]) 11/20/2013  . 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 Chronic pain syndrome 08/27/2013  . Back pain 07/17/2013  . HTN (hypertension), benign 07/17/2013  . Hyperlipidemia 07/17/2013  . Pancreatic insufficiency 07/17/2013  . Chronic postoperative pain 01/01/2013  . Long term current use of opiate analgesic 11/04/2012  . Hypothyroidism 08/25/2012  . Radiculopathy of leg 05/01/2011   Durwin Reges DPT Durwin Reges 03/24/2020, 11:07 AM  [facility] [address] [place], [address] Phone: [phone]   Fax:  [phone]  Name: MEYA CLUTTER MRN: [id number] Date of Birth: May 14, 1950

## 2020-03-31 ENCOUNTER — Ambulatory Visit: Payer: Medicare Other | Admitting: Physical Therapy

## 2020-04-07 ENCOUNTER — Other Ambulatory Visit: Payer: Self-pay

## 2020-04-07 ENCOUNTER — Encounter: Payer: Self-pay | Admitting: Physical Therapy

## 2020-04-07 ENCOUNTER — Ambulatory Visit: Payer: Medicare Other | Admitting: Physical Therapy

## 2020-04-07 DIAGNOSIS — M4126 Other idiopathic scoliosis, lumbar region: Secondary | ICD-10-CM

## 2020-04-07 DIAGNOSIS — M5416 Radiculopathy, lumbar region: Secondary | ICD-10-CM

## 2020-04-07 NOTE — Therapy (Signed)
[facility] [address] [place], [address] Phone: [phone]   Fax:  [phone]  Physical Therapy Treatment  Patient Details  Name: Colleen Payne MRN: [id number] Date of Birth: 1950-06-19 No data recorded  Encounter Date: 04/07/2020    Past Medical History:  Diagnosis Date  . Anxiety   . Atrial fibrillation ([place])   . Depression   . Diabetes mellitus without complication (HCC)    Type I   . Headache    stress. 1x/month  . Hypertension   . Insulin pump in place   . Motion sickness    cars  . MVP (mitral valve prolapse)   . Neuromuscular disorder (HCC)    leg weakness s/p back surgeries  . Thyroid disease     Past Surgical History:  Procedure Laterality Date  . BACK SURGERY  2004   3 sugeries between Sept and Nov, fusion L2-S1  . CATARACT EXTRACTION W/PHACO Left 11/23/2014   Procedure: CATARACT EXTRACTION PHACO AND INTRAOCULAR LENS PLACEMENT (IOC);  Surgeon: Leandrew Koyanagi, MD;  Location: [facility];  Service: Ophthalmology;  Laterality: Left;  DIABETIC - insulin pump    There were no vitals filed for this visit.    Ther-Ex -Hooklying wand flexion x20 c PVC - Lower trunk rotations x20 with 3sec hold in rotation - SKTC 3x 10sec hold bilat - DKTC 30sec hold - Modified happy baby rocks holding knees x20  - Prone press up x15; with prone prop 54minprior andfollowing - Prone alt supermans 2x 12 with min encouragement needed -Backwardwalking1.67mph for7mins,cuing forposture and "large steps" with decent carry over. Pt able to maintain more thoracic ext with cuing to "keep eyes up"          PT Short Term Goals - 12/10/19 1021      PT SHORT TERM GOAL #1   Title Patient will be adherent to initial HEP at least 3x a week to improve functional strength and balance for better safety at home.    Baseline 12/10/19 HEP given    Time 4    Period Weeks    Status New       PT SHORT TERM GOAL #2   Title Pt will increase 10MWT by at least 0.13 m/s in order to demonstrate clinically significant improvement in community ambulation    Baseline 12/10/19 0.75m/s    Time 4    Period Weeks    Status New             PT Long Term Goals - 12/10/19 1006      PT LONG TERM GOAL #1   Title Patient will be independent in bending down towards floor and picking up small object (<5 pounds) and then stand back up without increased pain to improve ability to pick up and clean up room at home    Baseline 09/25/18/20 able to pick up object without increase in pain, though she reports continued baseline pain, goal revised and achieved    Time 8    Period Weeks    Status Achieved      PT LONG TERM GOAL #2   Title Patient will increase BLE gross strength to 4+/5 as to improve functional strength for independent gait, increased standing tolerance and increased ADL ability.    Baseline 08/31/18 R/L flexion: 5/5 ER 5/5 IR 4+/4+ ABD 5/5 Ext 4+/4    Time 8    Period Weeks    Status Achieved  PT LONG TERM GOAL #3   Title Patient will report a worst pain of 5/10 with transfers of her husband to improve tolerance with ADLs     Baseline 12/10/19 Reports 10/10 back pain with bed transfers for husband    Time 8    Period Weeks    Status Revised      PT LONG TERM GOAL #4   Title Pt will decrease 5TSTS to 10sec order to demonstrate clinically significant improvement in LE strength, and reduced fall risk    Baseline 12/10/19 14sec    Time 8    Period Weeks    Status Revised      PT LONG TERM GOAL #5   Title Pt will increase 10MWT by at least 0.13 m/s in order to demonstrate clinically significant improvement in community ambulation    Baseline 12/10/19 0.49m/s    Time --    Period Weeks    Status Deferred      PT LONG TERM GOAL #6   Title Patient will demonstrate walk speed of 1.2 to demonstrate community ambulation norm    Baseline 12/10/19 0.69m/s    Time 8     Period Weeks    Status Revised      PT LONG TERM GOAL #7   Title Patient will increase standing tolerance to 57min without increased pain in order to complete household chores/cooking    Baseline 12/10/19 needs to sit following 24mins of standing activity d/t pain    Time 8    Period Weeks    Status On-going      PT LONG TERM GOAL #8   Title Patient will demonstrate SLS of 10sec to demonstrate decreased fall risk    Baseline 12/10/19 LLE 4sec RLE 2sec    Time 8    Period Weeks    Status Revised      PT LONG TERM GOAL  #9   TITLE Patient will demonstrate 15 STS in 30sec to demonstrate age-matched increase in LE endurance    Baseline 12/10/19 13    Time 8    Period Weeks    Status Revised                  Patient will benefit from skilled therapeutic intervention in order to improve the following deficits and impairments:     Visit Diagnosis: No diagnosis found.     Problem List Patient Active Problem List   Diagnosis Date Noted  . Central sleep apnea 10/09/2016  . Hypertension 10/09/2016  . Lumbar postlaminectomy syndrome 10/09/2016  . Lumbar radiculopathy 10/09/2016  . Diabetic polyneuropathy ([place]) 10/07/2016  . Right lumbosacral radiculopathy 10/07/2016  . Healthcare maintenance 08/26/2016  . DKA (diabetic ketoacidoses) 06/14/2016  . Atypical chest pain 02/23/2016  . SOB (shortness of breath) 02/23/2016  . Pain medication agreement signed 01/13/2015  . Acquired hypothyroidism 05/30/2014  . Frontal sinusitis 12/17/2013  . Atrial fibrillation ([place]) 11/29/2013  . Major depression in remission ([place]) 11/20/2013  . 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 Chronic pain syndrome 08/27/2013  . Back pain 07/17/2013  . HTN (hypertension), benign 07/17/2013  . Hyperlipidemia 07/17/2013  . Pancreatic insufficiency 07/17/2013  . Chronic postoperative pain 01/01/2013  . Long term current use of opiate analgesic 11/04/2012   . Hypothyroidism 08/25/2012  . Radiculopathy of leg 05/01/2011   Durwin Reges DPT Durwin Reges 04/07/2020, 10:21 AM  [facility] [address], [place], [address] Phone: [phone]   Fax:  [phone]  Name: Colleen Payne MRN: [id number] Date of Birth: Jul 20, 1950

## 2020-04-14 ENCOUNTER — Ambulatory Visit: Payer: Medicare Other | Admitting: Physical Therapy

## 2020-04-14 ENCOUNTER — Encounter: Payer: Self-pay | Admitting: Physical Therapy

## 2020-04-14 ENCOUNTER — Other Ambulatory Visit: Payer: Self-pay

## 2020-04-14 DIAGNOSIS — M5416 Radiculopathy, lumbar region: Secondary | ICD-10-CM

## 2020-04-14 DIAGNOSIS — M4126 Other idiopathic scoliosis, lumbar region: Secondary | ICD-10-CM

## 2020-04-14 NOTE — Therapy (Signed)
[facility] [address], [place], [address] Phone: [phone]   Fax:  [phone]  Physical Therapy Treatment/Progress Note Reporting Period 12/09/20 - 04/14/20  Patient Details  Name: Colleen Payne MRN: [id number] Date of Birth: [birth year] No data recorded  Encounter Date: 04/14/2020   PT End of Session - 04/14/20 1021    Visit Number 10    Number of Visits 37    Date for PT Re-Evaluation 06/02/20    Authorization Type Medicare    Authorization Time Period 10/10    Authorization - Visit Number 10    Authorization - Number of Visits 10    PT Start Time 1950    PT Stop Time 1100    PT Time Calculation (min) 45 min    Equipment Utilized During Treatment Other (comment)    Activity Tolerance Patient tolerated treatment well;No increased pain    Behavior During Therapy WFL for tasks assessed/performed           Past Medical History:  Diagnosis Date  . Anxiety   . Atrial fibrillation ([place])   . Depression   . Diabetes mellitus without complication (HCC)    Type I   . Headache    stress. 1x/month  . Hypertension   . Insulin pump in place   . Motion sickness    cars  . MVP (mitral valve prolapse)   . Neuromuscular disorder (HCC)    leg weakness s/p back surgeries  . Thyroid disease     Past Surgical History:  Procedure Laterality Date  . BACK SURGERY  2004   3 sugeries between Sept and Nov, fusion L2-S1  . CATARACT EXTRACTION W/PHACO Left 11/23/2014   Procedure: CATARACT EXTRACTION PHACO AND INTRAOCULAR LENS PLACEMENT (IOC);  Surgeon: Leandrew Koyanagi, MD;  Location: [facility];  Service: Ophthalmology;  Laterality: Left;  DIABETIC - insulin pump    There were no vitals filed for this visit.   Subjective Assessment - 04/14/20 1018    Subjective Patient reports 10/10 pain today. She thinks she is doing worse then last week    Pertinent History Patient is 70 yo female that  complains of lumbar pain and inability to walk erect, as well as hip/abdominal pain. Patient with history of lumbar fusion (L2-S1). Xray results show: Fairly stable appearance of the levoscoliosis of the lower thoracic and of the lumbar spine.Stable lateral subluxation toward the left of L2 with respect L3. Stable partial compression of the body of L1. Patient complains of frustration with physicians, and that her symptoms are worsening over time. 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 Reported that her pain and difficulty worsened after trying to pick her husband up about 1 year ago. Patient mentioned that she has been told that she has "spinal fluid pockets that are leaking". PMH also includes DM I with insulin pump,  afib, HTN,  HLD, chronic pain syndrome, SOB.    Limitations Standing;Walking;House hold activities;Other (comment)    Diagnostic tests see exray results: Fairly stable appearance of the levoscoliosis of the lower thoracic and lumbar spine. Stable lateral subluxation toward left of L2 with respect of L3, stable partial compression of the body of L1.    Patient Stated Goals pain relief, standing erect    Pain Onset More than a month ago    Pain Onset More than a month ago  Ther-Ex -Hooklying wand flexion x20 c PVC - Lower trunk rotations x20 with 3sec hold in rotation - SKTC 3x 10sec hold bilat - DKTC 30sec hold - Modified happy baby rocks holding knees x20  - Prone press up x15; with prone prop 46minprior andfollowing 5xSTS x2trials best time 11.4sec 30sec STS 14.5  SLS R:2sec L 2sec With 2 finger support 30sec bilat  PT reviewed the following HEP with patient with patient able to demonstrate a set of the following with min cuing for correction needed. PT educated patient on parameters of therex (how/when to inc/decrease intensity, frequency, rep/set range, stretch hold time, and purpose of therex) with verbalized  understanding.  Access Code: [email or web address] Standing Single Leg Stance with Counter Support - 3 x daily - 7 x weekly - 30sec hold Standing Lumbar Extension - 8 x daily - 7 x weekly - 12 reps - 2-3 hold Standing Hip Abduction with Resistance at Ankles and Counter Support - 1 x daily - 2-3 x weekly - 3 sets - 12-20 reps Standing Hip Extension with Resistance at Ankles and Counter Support - 1 x daily - 2-3 x weekly - 3 sets - 12-20 reps Squat with Chair Touch - 1 x daily - 2-3 x weekly - 3 sets - 12-20 reps  -Backwardwalking1.60mph for54mins,cuing forposture and "large steps" with decent carry over. Pt able to maintain more thoracic ext with cuing to "keep eyes up"      PT Education - 04/14/20 1021    Education Details therex form/technique; HEP review    Person(s) Educated Patient    Methods Explanation;Demonstration;Verbal cues    Comprehension Verbalized understanding;Returned demonstration;Verbal cues required            PT Short Term Goals - 04/14/20 1023      PT SHORT TERM GOAL #1   Title Patient will be adherent to initial HEP at least 4x a week to improve functional strength and balance for better safety at home.    Baseline 04/14/20 completing 50%    Time 4    Period Weeks    Status Revised      PT SHORT TERM GOAL #2   Title Pt will increase 10MWT by at least 0.13 m/s in order to demonstrate clinically significant improvement in community ambulation    Baseline 12/10/19 0.43m/s; 04/14/20 0.49m/s    Time 4    Period Weeks    Status On-going             PT Long Term Goals - 04/14/20 1023      PT LONG TERM GOAL #1   Title Patient will be independent in bending down towards floor and picking up small object (<5 pounds) and then stand back up without increased pain to improve ability to pick up and clean up room at home    Baseline 09/25/18/20 able to pick up object without increase in pain, though she reports continued baseline pain, goal revised and achieved    Time 8     Period Weeks    Status Achieved      PT LONG TERM GOAL #2   Title Patient will increase BLE gross strength to 4+/5 as to improve functional strength for independent gait, increased standing tolerance and increased ADL ability.    Baseline 08/31/18 R/L flexion: 5/5 ER 5/5 IR 4+/4+ ABD 5/5 Ext 4+/4    Time 8    Period Weeks    Status Achieved      PT LONG TERM GOAL #3  Title Patient will report a worst pain of 5/10 with household ADLs to improve tolerance with ADLs    Baseline 04/14/20 9-10/10 pain over the past 2 weeks (husband passed)    Time 8    Period Weeks    Status Revised      PT LONG TERM GOAL #4   Title Pt will decrease 5TSTS to 10sec order to demonstrate clinically significant improvement in LE strength, and reduced fall risk    Baseline 12/10/19 14sec 04/14/20 11.41    Time 8    Period Weeks    Status On-going      PT LONG TERM GOAL #5   Title Pt will increase 10MWT by at least 1.69m/s in order to demonstrate clinically significant improvement, and safety with independent community ambulation    Baseline 12/10/19 0.61m/s; 04/14/20 0.42m/s    Time 8    Period Weeks    Status On-going      PT LONG TERM GOAL #6   Title Patient will demonstrate 15 STS in 30sec to demonstrate age-matched increase in LE endurance    Baseline 12/09/20 13; 04/14/20 14.5    Time 8    Period Weeks    Status On-going      PT LONG TERM GOAL #7   Title Patient will increase standing tolerance to 85min without increased pain in order to complete household chores/cooking    Baseline 12/10/19 needs to sit following 21mins of standing activity d/t pain; 04/14/20 able to stand 30-49mins before having to sit d/t pain    Time 8    Period Weeks    Status Achieved      PT LONG TERM GOAL #8   Title Patient will demonstrate SLS of 10sec to demonstrate decreased fall risk    Baseline 12/10/19 LLE 4sec RLE 2sec; 04/14/20    Time 8    Period Weeks    Status On-going                 Plan -  04/14/20 1056    Clinical Impression Statement PT continued therex progression for increased spine mobilty with success. PT reassessed patient goals where she is making progress toward strength and endurance goals, with continued difficulty with static balance and community gait speed. Considering patient's various health issues and loss of her husband since last re-assessment this progress is on track. PT updated patient HEP to ensure understanding, where patient is able to demonstrate and verbalize understanding of HEP. PT will continue progression toward goals as able.    Personal Factors and Comorbidities Age;Behavior Pattern;Fitness;Past/Current Experience;Comorbidity 1;Comorbidity 2;Comorbidity 3+    Comorbidities scolosis, DM1, chronic pain syndrome, HTN, afib, depression    Examination-Activity Limitations Bathing;Dressing;Transfers;Caring for Others;Carry;Squat;Lift;Bend;Bed Mobility    Examination-Participation Restrictions Yard Work;Laundry;Cleaning;Community Activity    Stability/Clinical Decision Making Evolving/Moderate complexity    Clinical Decision Making Moderate    Rehab Potential Fair    Clinical Impairments Affecting Rehab Potential comorbidities, symptom duration, curvature degree    PT Frequency 2x / week    PT Duration 8 weeks    PT Treatment/Interventions ADLs/Self Care Home Management;Aquatic Therapy;Cryotherapy;Ultrasound;Parrafin;Traction;Moist Heat;Electrical Stimulation;DME Instruction;Gait training;Stair training;Functional mobility training;Neuromuscular re-education;Balance training;Therapeutic exercise;Therapeutic activities;Patient/family education;Energy conservation;Spinal Manipulations;Joint Manipulations;Passive range of motion;Dry needling;Manual techniques    PT Next Visit Plan continue POC    PT Home Exercise Plan Added standing pec stretch in doorway to HEP today    Consulted and Agree with Plan of Care Patient           Patient  will benefit from  skilled therapeutic intervention in order to improve the following deficits and impairments:  Abnormal gait,Decreased balance,Decreased endurance,Decreased mobility,Difficulty walking,Hypomobility,Increased muscle spasms,Decreased range of motion,Pain,Postural dysfunction,Impaired flexibility,Increased fascial restricitons,Decreased strength,Decreased activity tolerance  Visit Diagnosis: Other idiopathic scoliosis, lumbar region  Radiculopathy, lumbar region     Problem List Patient Active Problem List   Diagnosis Date Noted  . Central sleep apnea 10/09/2016  . Hypertension 10/09/2016  . Lumbar postlaminectomy syndrome 10/09/2016  . Lumbar radiculopathy 10/09/2016  . Diabetic polyneuropathy ([place]) 10/07/2016  . Right lumbosacral radiculopathy 10/07/2016  . Healthcare maintenance 08/26/2016  . DKA (diabetic ketoacidoses) 06/14/2016  . Atypical chest pain 02/23/2016  . SOB (shortness of breath) 02/23/2016  . Pain medication agreement signed 01/13/2015  . Acquired hypothyroidism 05/30/2014  . Frontal sinusitis 12/17/2013  . Atrial fibrillation ([place]) 11/29/2013  . Major depression in remission ([place]) 11/20/2013  . 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 Chronic pain syndrome 08/27/2013  . Back pain 07/17/2013  . HTN (hypertension), benign 07/17/2013  . Hyperlipidemia 07/17/2013  . Pancreatic insufficiency 07/17/2013  . Chronic postoperative pain 01/01/2013  . Long term current use of opiate analgesic 11/04/2012  . Hypothyroidism 08/25/2012  . Radiculopathy of leg 05/01/2011   Durwin Reges DPT Durwin Reges 04/14/2020, 11:06 AM  [facility] [address] [place], [address] Phone: [phone]   Fax:  [phone]  Name: FINLAY MILLS MRN: [id number] Date of Birth: 1950/08/06

## 2020-04-21 ENCOUNTER — Encounter: Payer: Self-pay | Admitting: Physical Therapy

## 2020-04-21 ENCOUNTER — Ambulatory Visit: Payer: Medicare Other | Attending: Anesthesiology | Admitting: Physical Therapy

## 2020-04-21 ENCOUNTER — Other Ambulatory Visit: Payer: Self-pay

## 2020-04-21 DIAGNOSIS — M4126 Other idiopathic scoliosis, lumbar region: Secondary | ICD-10-CM | POA: Diagnosis present

## 2020-04-21 DIAGNOSIS — M5416 Radiculopathy, lumbar region: Secondary | ICD-10-CM | POA: Insufficient documentation

## 2020-04-21 NOTE — Therapy (Signed)
[facility] [address] [place], [address] Phone: [phone]   Fax:  [phone]  Physical Therapy Treatment  Patient Details  Name: Colleen Payne MRN: [id number] Date of Birth: 10-18-1950 No data recorded  Encounter Date: 04/21/2020   PT End of Session - 04/21/20 1022    Visit Number 11    Number of Visits 37    Date for PT Re-Evaluation 06/02/20    Authorization Type Medicare    Authorization - Visit Number 1    Authorization - Number of Visits 10    PT Start Time 1013    PT Stop Time 1055    PT Time Calculation (min) 42 min    Equipment Utilized During Treatment Other (comment)    Activity Tolerance Patient tolerated treatment well;No increased pain    Behavior During Therapy WFL for tasks assessed/performed           Past Medical History:  Diagnosis Date  . Anxiety   . Atrial fibrillation ([place])   . Depression   . Diabetes mellitus without complication (HCC)    Type I   . Headache    stress. 1x/month  . Hypertension   . Insulin pump in place   . Motion sickness    cars  . MVP (mitral valve prolapse)   . Neuromuscular disorder (HCC)    leg weakness s/p back surgeries  . Thyroid disease     Past Surgical History:  Procedure Laterality Date  . BACK SURGERY  2004   3 sugeries between Sept and Nov, fusion L2-S1  . CATARACT EXTRACTION W/PHACO Left 11/23/2014   Procedure: CATARACT EXTRACTION PHACO AND INTRAOCULAR LENS PLACEMENT (IOC);  Surgeon: Leandrew Koyanagi, MD;  Location: [facility];  Service: Ophthalmology;  Laterality: Left;  DIABETIC - insulin pump    There were no vitals filed for this visit.   Subjective Assessment - 04/21/20 1016    Subjective Reports she completing HEP, with difficulty with lumbar ext. Reports 10/10 pain this morning    Pertinent History Patient is 70 yo female that complains of lumbar pain and inability to walk erect, as well as hip/abdominal  pain. Patient with history of lumbar fusion (L2-S1). Xray results show: Fairly stable appearance of the levoscoliosis of the lower thoracic and of the lumbar spine.Stable lateral subluxation toward the left of L2 with respect L3. Stable partial compression of the body of L1. Patient complains of frustration with physicians, and that her symptoms are worsening over time. 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 Reported that her pain and difficulty worsened after trying to pick her husband up about 1 year ago. Patient mentioned that she has been told that she has "spinal fluid pockets that are leaking". PMH also includes DM I with insulin pump,  afib, HTN,  HLD, chronic pain syndrome, SOB.    Limitations Standing;Walking;House hold activities;Other (comment)    Diagnostic tests see exray results: Fairly stable appearance of the levoscoliosis of the lower thoracic and lumbar spine. Stable lateral subluxation toward left of L2 with respect of L3, stable partial compression of the body of L1.    Patient Stated Goals pain relief, standing erect    Pain Onset More than a month ago    Pain Onset More than a month ago                Ther-Ex -Hooklying wand flexion x20 c PVC -  Lower trunk rotations x20 with 3sec hold in rotation - Prone press up x15; with prone prop 43minprior andfollowing - Prone alt supermans x12 with min encouragement needed - Qped birddog x12 with min stabilization with LUE/RLE lift  - Half kneeling with yellowTball overhead lift x12 bilat; minA with overpressure for tall kneeling with cuing for cervical ext toward ball with some carry over - Good Mornings PVC x12 with heavy TC needed for technique, minimal carry over without - Full stand with back on wall x15 (scooting heels closer to wall at 10th rep) blocking at R knee provided -Backwardwalking1.37mph for68mins,cuing forposture and "large steps" with decent carry over. Pt able  to maintain more thoracic ext with cuing to "keep eyes up"                        PT Education - 04/21/20 1021    Education Details therex form/technique    Person(s) Educated Patient    Methods Explanation;Demonstration;Verbal cues    Comprehension Verbalized understanding;Returned demonstration;Verbal cues required            PT Short Term Goals - 04/14/20 1023      PT SHORT TERM GOAL #1   Title Patient will be adherent to initial HEP at least 4x a week to improve functional strength and balance for better safety at home.    Baseline 04/14/20 completing 50%    Time 4    Period Weeks    Status Revised      PT SHORT TERM GOAL #2   Title Pt will increase 10MWT by at least 0.13 m/s in order to demonstrate clinically significant improvement in community ambulation    Baseline 12/10/19 0.62m/s; 04/14/20 0.41m/s    Time 4    Period Weeks    Status On-going             PT Long Term Goals - 04/14/20 1023      PT LONG TERM GOAL #1   Title Patient will be independent in bending down towards floor and picking up small object (<5 pounds) and then stand back up without increased pain to improve ability to pick up and clean up room at home    Baseline 09/25/18/20 able to pick up object without increase in pain, though she reports continued baseline pain, goal revised and achieved    Time 8    Period Weeks    Status Achieved      PT LONG TERM GOAL #2   Title Patient will increase BLE gross strength to 4+/5 as to improve functional strength for independent gait, increased standing tolerance and increased ADL ability.    Baseline 08/31/18 R/L flexion: 5/5 ER 5/5 IR 4+/4+ ABD 5/5 Ext 4+/4    Time 8    Period Weeks    Status Achieved      PT LONG TERM GOAL #3   Title Patient will report a worst pain of 5/10 with household ADLs to improve tolerance with ADLs    Baseline 04/14/20 9-10/10 pain over the past 2 weeks (husband passed)    Time 8    Period Weeks    Status  Revised      PT LONG TERM GOAL #4   Title Pt will decrease 5TSTS to 10sec order to demonstrate clinically significant improvement in LE strength, and reduced fall risk    Baseline 12/10/19 14sec 04/14/20 11.41    Time 8    Period Weeks    Status On-going  PT LONG TERM GOAL #5   Title Pt will increase 10MWT by at least 1.31m/s in order to demonstrate clinically significant improvement, and safety with independent community ambulation    Baseline 12/10/19 0.23m/s; 04/14/20 0.98m/s    Time 8    Period Weeks    Status On-going      PT LONG TERM GOAL #6   Title Patient will demonstrate 15 STS in 30sec to demonstrate age-matched increase in LE endurance    Baseline 12/09/20 13; 04/14/20 14.5    Time 8    Period Weeks    Status On-going      PT LONG TERM GOAL #7   Title Patient will increase standing tolerance to 26min without increased pain in order to complete household chores/cooking    Baseline 12/10/19 needs to sit following 73mins of standing activity d/t pain; 04/14/20 able to stand 30-45mins before having to sit d/t pain    Time 8    Period Weeks    Status Achieved      PT LONG TERM GOAL #8   Title Patient will demonstrate SLS of 10sec to demonstrate decreased fall risk    Baseline 12/10/19 LLE 4sec RLE 2sec; 04/14/20    Time 8    Period Weeks    Status On-going                 Plan - 04/21/20 1025    Clinical Impression Statement PT continued therex progression for increased mobility and extensor strength with success. Patient is able to comply with all cuing for proper technique of therex with good motivation throughout session and pain reduced to 7/10. Patient is able to complete planned progression with appropriate rest breaks, physical assistance as needed. PT will continue progression as able.    Personal Factors and Comorbidities Age;Behavior Pattern;Fitness;Past/Current Experience;Comorbidity 1;Comorbidity 2;Comorbidity 3+    Comorbidities scolosis, DM1,  chronic pain syndrome, HTN, afib, depression    Examination-Activity Limitations Bathing;Dressing;Transfers;Caring for Others;Carry;Squat;Lift;Bend;Bed Mobility    Examination-Participation Restrictions Yard Work;Laundry;Cleaning;Community Activity    Stability/Clinical Decision Making Evolving/Moderate complexity    Clinical Decision Making Moderate    Rehab Potential Fair    Clinical Impairments Affecting Rehab Potential comorbidities, symptom duration, curvature degree    PT Frequency 2x / week    PT Duration 8 weeks    PT Treatment/Interventions ADLs/Self Care Home Management;Aquatic Therapy;Cryotherapy;Ultrasound;Parrafin;Traction;Moist Heat;Electrical Stimulation;DME Instruction;Gait training;Stair training;Functional mobility training;Neuromuscular re-education;Balance training;Therapeutic exercise;Therapeutic activities;Patient/family education;Energy conservation;Spinal Manipulations;Joint Manipulations;Passive range of motion;Dry needling;Manual techniques    PT Next Visit Plan continue POC    PT Home Exercise Plan Added standing pec stretch in doorway to HEP today    Consulted and Agree with Plan of Care Patient           Patient will benefit from skilled therapeutic intervention in order to improve the following deficits and impairments:  Abnormal gait,Decreased balance,Decreased endurance,Decreased mobility,Difficulty walking,Hypomobility,Increased muscle spasms,Decreased range of motion,Pain,Postural dysfunction,Impaired flexibility,Increased fascial restricitons,Decreased strength,Decreased activity tolerance  Visit Diagnosis: Other idiopathic scoliosis, lumbar region  Radiculopathy, lumbar region     Problem List Patient Active Problem List   Diagnosis Date Noted  . Central sleep apnea 10/09/2016  . Hypertension 10/09/2016  . Lumbar postlaminectomy syndrome 10/09/2016  . Lumbar radiculopathy 10/09/2016  . Diabetic polyneuropathy ([place]) 10/07/2016  . Right  lumbosacral radiculopathy 10/07/2016  . Healthcare maintenance 08/26/2016  . DKA (diabetic ketoacidoses) 06/14/2016  . Atypical chest pain 02/23/2016  . SOB (shortness of breath) 02/23/2016  . Pain medication agreement signed 01/13/2015  .  Acquired hypothyroidism 05/30/2014  . Frontal sinusitis 12/17/2013  . Atrial fibrillation ([place]) 11/29/2013  . Major depression in remission ([place]) 11/20/2013  . 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 Chronic pain syndrome 08/27/2013  . Back pain 07/17/2013  . HTN (hypertension), benign 07/17/2013  . Hyperlipidemia 07/17/2013  . Pancreatic insufficiency 07/17/2013  . Chronic postoperative pain 01/01/2013  . Long term current use of opiate analgesic 11/04/2012  . Hypothyroidism 08/25/2012  . Radiculopathy of leg 05/01/2011   Durwin Reges DPT Durwin Reges 04/21/2020, 11:01 AM  [facility] [address], [place], [address] Phone: [phone]   Fax:  [phone]  Name: Colleen Payne MRN: [id number] Date of Birth: March 16, 1950

## 2020-04-28 ENCOUNTER — Ambulatory Visit: Payer: Medicare Other | Admitting: Physical Therapy

## 2020-04-28 ENCOUNTER — Other Ambulatory Visit: Payer: Self-pay

## 2020-04-28 ENCOUNTER — Encounter: Payer: Self-pay | Admitting: Physical Therapy

## 2020-04-28 DIAGNOSIS — M4126 Other idiopathic scoliosis, lumbar region: Secondary | ICD-10-CM

## 2020-04-28 DIAGNOSIS — M5416 Radiculopathy, lumbar region: Secondary | ICD-10-CM

## 2020-04-28 NOTE — Therapy (Signed)
[facility] [address], [place], [address] Phone: [phone]   Fax:  [phone]  Physical Therapy Treatment  Patient Details  Name: Colleen Payne MRN: [id number] Date of Birth: 1950-02-13 No data recorded  Encounter Date: 04/28/2020   PT End of Session - 04/28/20 1023    Visit Number 12    Number of Visits 37    Date for PT Re-Evaluation 06/02/20    Authorization - Visit Number 2    Authorization - Number of Visits 10    PT Start Time 9485    PT Stop Time 1055    PT Time Calculation (min) 40 min    Equipment Utilized During Treatment Other (comment)    Activity Tolerance Patient tolerated treatment well;No increased pain    Behavior During Therapy WFL for tasks assessed/performed           Past Medical History:  Diagnosis Date  . Anxiety   . Atrial fibrillation ([place])   . Depression   . Diabetes mellitus without complication (HCC)    Type I   . Headache    stress. 1x/month  . Hypertension   . Insulin pump in place   . Motion sickness    cars  . MVP (mitral valve prolapse)   . Neuromuscular disorder (HCC)    leg weakness s/p back surgeries  . Thyroid disease     Past Surgical History:  Procedure Laterality Date  . BACK SURGERY  2004   3 sugeries between Sept and Nov, fusion L2-S1  . CATARACT EXTRACTION W/PHACO Left 11/23/2014   Procedure: CATARACT EXTRACTION PHACO AND INTRAOCULAR LENS PLACEMENT (IOC);  Surgeon: Leandrew Koyanagi, MD;  Location: [facility];  Service: Ophthalmology;  Laterality: Left;  DIABETIC - insulin pump    There were no vitals filed for this visit.   Subjective Assessment - 04/28/20 1016    Subjective Reports minimal compliance with HEP, that she is just depressed lately. Reports her pain is better when she is moving around, is in 7/10 pain this am. She is not keeping up with her blood sugar well, and says she knows she needs to do better with this.     Pertinent History Patient is 70 yo female that complains of lumbar pain and inability to walk erect, as well as hip/abdominal pain. Patient with history of lumbar fusion (L2-S1). Xray results show: Fairly stable appearance of the levoscoliosis of the lower thoracic and of the lumbar spine.Stable lateral subluxation toward the left of L2 with respect L3. Stable partial compression of the body of L1. Patient complains of frustration with physicians, and that her symptoms are worsening over time. 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 Reported that her pain and difficulty worsened after trying to pick her husband up about 1 year ago. Patient mentioned that she has been told that she has "spinal fluid pockets that are leaking". PMH also includes DM I with insulin pump,  afib, HTN,  HLD, chronic pain syndrome, SOB.    Limitations Standing;Walking;House hold activities;Other (comment)    Diagnostic tests see exray results: Fairly stable appearance of the levoscoliosis of the lower thoracic and lumbar spine. Stable lateral subluxation toward left of L2 with respect of L3, stable partial compression of the body of L1.    Patient Stated Goals pain relief, standing erect    Pain Onset More than a month ago    Pain Onset  More than a month ago             Ther-Ex -Hooklying wand flexion x20 c PVC - Lower trunk rotations x20 with 3sec hold in rotation - Bridge x15 with min cuing for full ext with good carry over - Prone press up x15; with prone prop 38minprior andfollowing -Prone alt supermans x12 with min encouragement needed - Half kneeling with yellowTball overhead lift x12 bilat; minA with overpressure for tall kneeling with cuing for cervical ext toward ball with some carry over - Good Mornings PVC x12 with heavy TC needed for technique, minimal carry over without - Full stand with back on wall x15 (scooting heels closer to wall at 10th rep) blocking at  R knee provided -Backwardwalking1.87mph for12mins,cuing forposture and "large steps" with decent carry over. Pt able to maintain more thoracic ext with cuing to "keep eyes up"                         PT Education - 04/28/20 1022    Education Details therex form/technique    Person(s) Educated Patient    Methods Explanation;Demonstration;Verbal cues    Comprehension Returned demonstration;Verbalized understanding;Verbal cues required            PT Short Term Goals - 04/14/20 1023      PT SHORT TERM GOAL #1   Title Patient will be adherent to initial HEP at least 4x a week to improve functional strength and balance for better safety at home.    Baseline 04/14/20 completing 50%    Time 4    Period Weeks    Status Revised      PT SHORT TERM GOAL #2   Title Pt will increase 10MWT by at least 0.13 m/s in order to demonstrate clinically significant improvement in community ambulation    Baseline 12/10/19 0.42m/s; 04/14/20 0.73m/s    Time 4    Period Weeks    Status On-going             PT Long Term Goals - 04/14/20 1023      PT LONG TERM GOAL #1   Title Patient will be independent in bending down towards floor and picking up small object (<5 pounds) and then stand back up without increased pain to improve ability to pick up and clean up room at home    Baseline 09/25/18/20 able to pick up object without increase in pain, though she reports continued baseline pain, goal revised and achieved    Time 8    Period Weeks    Status Achieved      PT LONG TERM GOAL #2   Title Patient will increase BLE gross strength to 4+/5 as to improve functional strength for independent gait, increased standing tolerance and increased ADL ability.    Baseline 08/31/18 R/L flexion: 5/5 ER 5/5 IR 4+/4+ ABD 5/5 Ext 4+/4    Time 8    Period Weeks    Status Achieved      PT LONG TERM GOAL #3   Title Patient will report a worst pain of 5/10 with household ADLs to improve tolerance  with ADLs    Baseline 04/14/20 9-10/10 pain over the past 2 weeks (husband passed)    Time 8    Period Weeks    Status Revised      PT LONG TERM GOAL #4   Title Pt will decrease 5TSTS to 10sec order to demonstrate clinically significant improvement in LE strength,  and reduced fall risk    Baseline 12/10/19 14sec 04/14/20 11.41    Time 8    Period Weeks    Status On-going      PT LONG TERM GOAL #5   Title Pt will increase 10MWT by at least 1.57m/s in order to demonstrate clinically significant improvement, and safety with independent community ambulation    Baseline 12/10/19 0.19m/s; 04/14/20 0.75m/s    Time 8    Period Weeks    Status On-going      PT LONG TERM GOAL #6   Title Patient will demonstrate 15 STS in 30sec to demonstrate age-matched increase in LE endurance    Baseline 12/09/20 13; 04/14/20 14.5    Time 8    Period Weeks    Status On-going      PT LONG TERM GOAL #7   Title Patient will increase standing tolerance to 78min without increased pain in order to complete household chores/cooking    Baseline 12/10/19 needs to sit following 57mins of standing activity d/t pain; 04/14/20 able to stand 30-63mins before having to sit d/t pain    Time 8    Period Weeks    Status Achieved      PT LONG TERM GOAL #8   Title Patient will demonstrate SLS of 10sec to demonstrate decreased fall risk    Baseline 12/10/19 LLE 4sec RLE 2sec; 04/14/20    Time 8    Period Weeks    Status On-going                  Patient will benefit from skilled therapeutic intervention in order to improve the following deficits and impairments:     Visit Diagnosis: Other idiopathic scoliosis, lumbar region  Radiculopathy, lumbar region     Problem List Patient Active Problem List   Diagnosis Date Noted  . Central sleep apnea 10/09/2016  . Hypertension 10/09/2016  . Lumbar postlaminectomy syndrome 10/09/2016  . Lumbar radiculopathy 10/09/2016  . Diabetic polyneuropathy ([place])  10/07/2016  . Right lumbosacral radiculopathy 10/07/2016  . Healthcare maintenance 08/26/2016  . DKA (diabetic ketoacidoses) 06/14/2016  . Atypical chest pain 02/23/2016  . SOB (shortness of breath) 02/23/2016  . Pain medication agreement signed 01/13/2015  . Acquired hypothyroidism 05/30/2014  . Frontal sinusitis 12/17/2013  . Atrial fibrillation ([place]) 11/29/2013  . Major depression in remission ([place]) 11/20/2013  . 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 Chronic pain syndrome 08/27/2013  . Back pain 07/17/2013  . HTN (hypertension), benign 07/17/2013  . Hyperlipidemia 07/17/2013  . Pancreatic insufficiency 07/17/2013  . Chronic postoperative pain 01/01/2013  . Long term current use of opiate analgesic 11/04/2012  . Hypothyroidism 08/25/2012  . Radiculopathy of leg 05/01/2011   Durwin Reges DPT Durwin Reges 04/28/2020, 10:48 AM  [facility] [address], [place], [address] Phone: [phone]   Fax:  [phone]  Name: Colleen Payne MRN: [id number] Date of Birth: 03/06/1950

## 2020-05-04 ENCOUNTER — Emergency Department: Payer: Medicare Other

## 2020-05-04 ENCOUNTER — Inpatient Hospital Stay
Admission: EM | Admit: 2020-05-04 | Discharge: 2020-05-07 | DRG: 637 | Disposition: A | Discharge status: Home Health | Payer: Medicare Other | Attending: Internal Medicine | Admitting: Internal Medicine

## 2020-05-04 ENCOUNTER — Other Ambulatory Visit: Payer: Self-pay

## 2020-05-04 DIAGNOSIS — G9341 Metabolic encephalopathy: Secondary | ICD-10-CM | POA: Diagnosis present

## 2020-05-04 DIAGNOSIS — E1010 Type 1 diabetes mellitus with ketoacidosis without coma: Secondary | ICD-10-CM | POA: Diagnosis present

## 2020-05-04 DIAGNOSIS — Z886 Allergy status to analgesic agent status: Secondary | ICD-10-CM | POA: Diagnosis not present

## 2020-05-04 DIAGNOSIS — Z794 Long term (current) use of insulin: Secondary | ICD-10-CM

## 2020-05-04 DIAGNOSIS — Z961 Presence of intraocular lens: Secondary | ICD-10-CM | POA: Diagnosis present

## 2020-05-04 DIAGNOSIS — E876 Hypokalemia: Secondary | ICD-10-CM | POA: Diagnosis present

## 2020-05-04 DIAGNOSIS — Z8249 Family history of ischemic heart disease and other diseases of the circulatory system: Secondary | ICD-10-CM

## 2020-05-04 DIAGNOSIS — F419 Anxiety disorder, unspecified: Secondary | ICD-10-CM | POA: Diagnosis present

## 2020-05-04 DIAGNOSIS — R791 Abnormal coagulation profile: Secondary | ICD-10-CM | POA: Diagnosis present

## 2020-05-04 DIAGNOSIS — E785 Hyperlipidemia, unspecified: Secondary | ICD-10-CM | POA: Diagnosis present

## 2020-05-04 DIAGNOSIS — Z87891 Personal history of nicotine dependence: Secondary | ICD-10-CM

## 2020-05-04 DIAGNOSIS — Z91041 Radiographic dye allergy status: Secondary | ICD-10-CM

## 2020-05-04 DIAGNOSIS — N179 Acute kidney failure, unspecified: Secondary | ICD-10-CM | POA: Diagnosis present

## 2020-05-04 DIAGNOSIS — R4182 Altered mental status, unspecified: Secondary | ICD-10-CM

## 2020-05-04 DIAGNOSIS — Z7901 Long term (current) use of anticoagulants: Secondary | ICD-10-CM

## 2020-05-04 DIAGNOSIS — Z20822 Contact with and (suspected) exposure to covid-19: Secondary | ICD-10-CM | POA: Diagnosis present

## 2020-05-04 DIAGNOSIS — I10 Essential (primary) hypertension: Secondary | ICD-10-CM | POA: Diagnosis present

## 2020-05-04 DIAGNOSIS — J189 Pneumonia, unspecified organism: Secondary | ICD-10-CM | POA: Diagnosis present

## 2020-05-04 DIAGNOSIS — Z888 Allergy status to other drugs, medicaments and biological substances status: Secondary | ICD-10-CM | POA: Diagnosis not present

## 2020-05-04 DIAGNOSIS — Z9842 Cataract extraction status, left eye: Secondary | ICD-10-CM

## 2020-05-04 DIAGNOSIS — Z9841 Cataract extraction status, right eye: Secondary | ICD-10-CM | POA: Diagnosis not present

## 2020-05-04 DIAGNOSIS — I4891 Unspecified atrial fibrillation: Secondary | ICD-10-CM | POA: Diagnosis present

## 2020-05-04 DIAGNOSIS — G894 Chronic pain syndrome: Secondary | ICD-10-CM | POA: Diagnosis present

## 2020-05-04 DIAGNOSIS — I341 Nonrheumatic mitral (valve) prolapse: Secondary | ICD-10-CM | POA: Diagnosis present

## 2020-05-04 DIAGNOSIS — Z9641 Presence of insulin pump (external) (internal): Secondary | ICD-10-CM | POA: Diagnosis present

## 2020-05-04 DIAGNOSIS — Z981 Arthrodesis status: Secondary | ICD-10-CM

## 2020-05-04 DIAGNOSIS — E039 Hypothyroidism, unspecified: Secondary | ICD-10-CM | POA: Diagnosis present

## 2020-05-04 DIAGNOSIS — E1110 Type 2 diabetes mellitus with ketoacidosis without coma: Secondary | ICD-10-CM | POA: Diagnosis present

## 2020-05-04 DIAGNOSIS — F32A Depression, unspecified: Secondary | ICD-10-CM | POA: Diagnosis present

## 2020-05-04 DIAGNOSIS — J690 Pneumonitis due to inhalation of food and vomit: Secondary | ICD-10-CM | POA: Diagnosis not present

## 2020-05-04 DIAGNOSIS — Z79890 Hormone replacement therapy (postmenopausal): Secondary | ICD-10-CM

## 2020-05-04 DIAGNOSIS — Z79899 Other long term (current) drug therapy: Secondary | ICD-10-CM

## 2020-05-04 DIAGNOSIS — Z885 Allergy status to narcotic agent status: Secondary | ICD-10-CM

## 2020-05-04 LAB — URINE DRUG SCREEN, QUALITATIVE (ARMC ONLY)
Amphetamines, Ur Screen: NOT DETECTED
Barbiturates, Ur Screen: NOT DETECTED
Benzodiazepine, Ur Scrn: NOT DETECTED
Cannabinoid 50 Ng, Ur ~~LOC~~: NOT DETECTED
Cocaine Metabolite,Ur ~~LOC~~: NOT DETECTED
MDMA (Ecstasy)Ur Screen: NOT DETECTED
Methadone Scn, Ur: NOT DETECTED
Opiate, Ur Screen: POSITIVE — AB
Phencyclidine (PCP) Ur S: NOT DETECTED
Tricyclic, Ur Screen: NOT DETECTED

## 2020-05-04 LAB — URINALYSIS, COMPLETE (UACMP) WITH MICROSCOPIC
Bilirubin Urine: NEGATIVE
Glucose, UA: >=500 mg/dL — AB
Ketones, ur: 80 mg/dL — AB
Leukocytes,Ua: NEGATIVE
Nitrite: NEGATIVE
Protein, ur: NEGATIVE mg/dL
Specific Gravity, Urine: 1.017 (ref 1.005–1.030)
Squamous Epithelial / HPF: NONE SEEN (ref 0–5)
pH: 5 (ref 5.0–8.0)

## 2020-05-04 LAB — LACTIC ACID, PLASMA
Lactic Acid, Venous: 3.1 mmol/L (ref 0.5–1.9)
Lactic Acid, Venous: 3 mmol/L (ref 0.5–1.9)

## 2020-05-04 LAB — GLUCOSE, CAPILLARY
Glucose-Capillary: 391 mg/dL — ABNORMAL HIGH (ref 70–99)
Glucose-Capillary: 462 mg/dL — ABNORMAL HIGH (ref 70–99)
Glucose-Capillary: 342 mg/dL — ABNORMAL HIGH (ref 70–99)
Glucose-Capillary: 322 mg/dL — ABNORMAL HIGH (ref 70–99)
Glucose-Capillary: 243 mg/dL — ABNORMAL HIGH (ref 70–99)
Glucose-Capillary: 210 mg/dL — ABNORMAL HIGH (ref 70–99)
Glucose-Capillary: 195 mg/dL — ABNORMAL HIGH (ref 70–99)
Glucose-Capillary: 171 mg/dL — ABNORMAL HIGH (ref 70–99)
Glucose-Capillary: 195 mg/dL — ABNORMAL HIGH (ref 70–99)
Glucose-Capillary: 332 mg/dL — ABNORMAL HIGH (ref 70–99)
Glucose-Capillary: 150 mg/dL — ABNORMAL HIGH (ref 70–99)
Glucose-Capillary: 141 mg/dL — ABNORMAL HIGH (ref 70–99)

## 2020-05-04 LAB — BLOOD GAS, VENOUS
Acid-base deficit: 19.4 mmol/L — ABNORMAL HIGH (ref 0.0–2.0)
Acid-base deficit: 9.5 mmol/L — ABNORMAL HIGH (ref 0.0–2.0)
Bicarbonate: 8.5 mmol/L — ABNORMAL LOW (ref 20.0–28.0)
Bicarbonate: 16.4 mmol/L — ABNORMAL LOW (ref 20.0–28.0)
O2 Saturation: 67 %
O2 Saturation: 67 %
Patient temperature: 37
Patient temperature: 37
pCO2, Ven: 26 mmHg — ABNORMAL LOW (ref 44.0–60.0)
pCO2, Ven: 35 mmHg — ABNORMAL LOW (ref 44.0–60.0)
pH, Ven: 7.12 — CL (ref 7.250–7.430)
pH, Ven: 7.28 (ref 7.250–7.430)
pO2, Ven: 48 mmHg — ABNORMAL HIGH (ref 32.0–45.0)
pO2, Ven: 40 mmHg (ref 32.0–45.0)

## 2020-05-04 LAB — MAGNESIUM: Magnesium: 2.2 mg/dL (ref 1.7–2.4)

## 2020-05-04 LAB — CBC
HCT: 45.2 % (ref 36.0–46.0)
Hemoglobin: 14.5 g/dL (ref 12.0–15.0)
MCH: 34.1 pg — ABNORMAL HIGH (ref 26.0–34.0)
MCHC: 32.1 g/dL (ref 30.0–36.0)
MCV: 106.4 fL — ABNORMAL HIGH (ref 80.0–100.0)
Platelets: 365 10*3/uL (ref 150–400)
RBC: 4.25 MIL/uL (ref 3.87–5.11)
RDW: 14.1 % (ref 11.5–15.5)
WBC: 16.2 10*3/uL — ABNORMAL HIGH (ref 4.0–10.5)
nRBC: 0 % (ref 0.0–0.2)

## 2020-05-04 LAB — BASIC METABOLIC PANEL
Anion gap: 25 — ABNORMAL HIGH (ref 5–15)
Anion gap: 13 (ref 5–15)
Anion gap: 9 (ref 5–15)
BUN: 52 mg/dL — ABNORMAL HIGH (ref 8–23)
BUN: 43 mg/dL — ABNORMAL HIGH (ref 8–23)
BUN: 40 mg/dL — ABNORMAL HIGH (ref 8–23)
CO2: 15 mmol/L — ABNORMAL LOW (ref 22–32)
CO2: 24 mmol/L (ref 22–32)
CO2: 29 mmol/L (ref 22–32)
Calcium: 8.8 mg/dL — ABNORMAL LOW (ref 8.9–10.3)
Calcium: 9.4 mg/dL (ref 8.9–10.3)
Calcium: 9 mg/dL (ref 8.9–10.3)
Chloride: 101 mmol/L (ref 98–111)
Chloride: 106 mmol/L (ref 98–111)
Chloride: 105 mmol/L (ref 98–111)
Creatinine, Ser: 1.7 mg/dL — ABNORMAL HIGH (ref 0.44–1.00)
Creatinine, Ser: 1.06 mg/dL — ABNORMAL HIGH (ref 0.44–1.00)
Creatinine, Ser: 1.03 mg/dL — ABNORMAL HIGH (ref 0.44–1.00)
GFR, Estimated: 32 mL/min — ABNORMAL LOW (ref >60)
GFR, Estimated: 57 mL/min — ABNORMAL LOW (ref >60)
GFR, Estimated: 58 mL/min — ABNORMAL LOW (ref >60)
Glucose, Bld: 593 mg/dL (ref 70–99)
Glucose, Bld: 221 mg/dL — ABNORMAL HIGH (ref 70–99)
Glucose, Bld: 148 mg/dL — ABNORMAL HIGH (ref 70–99)
Potassium: 3.2 mmol/L — ABNORMAL LOW (ref 3.5–5.1)
Potassium: 3.7 mmol/L (ref 3.5–5.1)
Potassium: 3.4 mmol/L — ABNORMAL LOW (ref 3.5–5.1)
Sodium: 141 mmol/L (ref 135–145)
Sodium: 143 mmol/L (ref 135–145)
Sodium: 143 mmol/L (ref 135–145)

## 2020-05-04 LAB — PROTIME-INR
INR: 4.2 (ref 0.8–1.2)
Prothrombin Time: 40.4 seconds — ABNORMAL HIGH (ref 11.4–15.2)

## 2020-05-04 LAB — CBG MONITORING, ED
Glucose-Capillary: >600 mg/dL (ref 70–99)
Glucose-Capillary: 535 mg/dL (ref 70–99)
Glucose-Capillary: >600 mg/dL (ref 70–99)
Glucose-Capillary: 513 mg/dL (ref 70–99)

## 2020-05-04 LAB — COMPREHENSIVE METABOLIC PANEL
ALT: 29 U/L (ref 0–44)
AST: 41 U/L (ref 15–41)
Albumin: 4.6 g/dL (ref 3.5–5.0)
Alkaline Phosphatase: 108 U/L (ref 38–126)
Anion gap: 35 — ABNORMAL HIGH (ref 5–15)
BUN: 62 mg/dL — ABNORMAL HIGH (ref 8–23)
CO2: 11 mmol/L — ABNORMAL LOW (ref 22–32)
Calcium: 10.1 mg/dL (ref 8.9–10.3)
Chloride: 89 mmol/L — ABNORMAL LOW (ref 98–111)
Creatinine, Ser: 2.17 mg/dL — ABNORMAL HIGH (ref 0.44–1.00)
GFR, Estimated: 24 mL/min — ABNORMAL LOW (ref >60)
Glucose, Bld: 1027 mg/dL (ref 70–99)
Potassium: 4.6 mmol/L (ref 3.5–5.1)
Sodium: 135 mmol/L (ref 135–145)
Total Bilirubin: 2.8 mg/dL — ABNORMAL HIGH (ref 0.3–1.2)
Total Protein: 7.8 g/dL (ref 6.5–8.1)

## 2020-05-04 LAB — APTT: aPTT: 41 seconds — ABNORMAL HIGH (ref 24–36)

## 2020-05-04 LAB — RESP PANEL BY RT-PCR (FLU A&B, COVID) ARPGX2
Influenza A by PCR: NEGATIVE
Influenza B by PCR: NEGATIVE
SARS Coronavirus 2 by RT PCR: NEGATIVE

## 2020-05-04 LAB — BETA-HYDROXYBUTYRIC ACID
Beta-Hydroxybutyric Acid: >8 mmol/L — ABNORMAL HIGH (ref 0.05–0.27)
Beta-Hydroxybutyric Acid: 1.94 mmol/L — ABNORMAL HIGH (ref 0.05–0.27)

## 2020-05-04 LAB — PROCALCITONIN: Procalcitonin: 1.27 ng/mL

## 2020-05-04 LAB — OSMOLALITY: Osmolality: 375 mOsm/kg (ref 275–295)

## 2020-05-04 MED ORDER — DEXTROSE 50 % IV SOLN
0.0000 mL | INTRAVENOUS | Status: DC | PRN
  Administered 2020-05-06: 25 mL via INTRAVENOUS
  Administered 2020-05-06: 50 mL via INTRAVENOUS
  Filled 2020-05-04 (×2): qty 50

## 2020-05-04 MED ORDER — DOCUSATE SODIUM 100 MG PO CAPS: 100.0000 mg | ORAL_CAPSULE | Freq: Two times a day (BID) | ORAL | Status: DC | PRN

## 2020-05-04 MED ORDER — SODIUM CHLORIDE 0.9 % IV BOLUS
1000.0000 mL | Freq: Once | INTRAVENOUS | Status: AC
  Administered 2020-05-04: 1000 mL via INTRAVENOUS

## 2020-05-04 MED ORDER — SODIUM CHLORIDE 0.9 % IV SOLN
500.0000 mg | INTRAVENOUS | Status: DC
  Administered 2020-05-04 – 2020-05-05 (×2): 500 mg via INTRAVENOUS
  Filled 2020-05-04 (×2): qty 500

## 2020-05-04 MED ORDER — INSULIN REGULAR(HUMAN) IN NACL 100-0.9 UT/100ML-% IV SOLN
INTRAVENOUS | Status: DC
  Administered 2020-05-04: 4.2 [IU]/h via INTRAVENOUS
  Filled 2020-05-04: qty 100

## 2020-05-04 MED ORDER — SODIUM CHLORIDE 0.9 % IV SOLN
2.0000 g | INTRAVENOUS | Status: DC
  Administered 2020-05-04: 2 g via INTRAVENOUS
  Filled 2020-05-04: qty 20

## 2020-05-04 MED ORDER — LACTATED RINGERS IV BOLUS
1000.0000 mL | Freq: Once | INTRAVENOUS | Status: AC
  Administered 2020-05-04: 1000 mL via INTRAVENOUS

## 2020-05-04 MED ORDER — POTASSIUM CHLORIDE 10 MEQ/100ML IV SOLN
10.0000 meq | INTRAVENOUS | Status: AC
  Administered 2020-05-05 (×4): 10 meq via INTRAVENOUS
  Filled 2020-05-04 (×4): qty 100

## 2020-05-04 MED ORDER — POLYETHYLENE GLYCOL 3350 17 G PO PACK: 17.0000 g | PACK | Freq: Every day | ORAL | Status: DC | PRN

## 2020-05-04 MED ORDER — SODIUM CHLORIDE 0.9 % IV SOLN
3.0000 g | Freq: Two times a day (BID) | INTRAVENOUS | Status: DC
  Administered 2020-05-04: 3 g via INTRAVENOUS
  Filled 2020-05-04 (×2): qty 8

## 2020-05-04 MED ORDER — POTASSIUM CHLORIDE 10 MEQ/100ML IV SOLN
10.0000 meq | INTRAVENOUS | Status: AC
  Administered 2020-05-04: 10 meq via INTRAVENOUS
  Filled 2020-05-04: qty 100

## 2020-05-04 MED ORDER — LACTATED RINGERS IV SOLN: INTRAVENOUS | Status: AC

## 2020-05-04 MED ORDER — POTASSIUM CHLORIDE 10 MEQ/100ML IV SOLN
10.0000 meq | INTRAVENOUS | Status: AC
  Administered 2020-05-04 (×3): 10 meq via INTRAVENOUS
  Filled 2020-05-04 (×4): qty 100

## 2020-05-04 MED ORDER — DEXTROSE IN LACTATED RINGERS 5 % IV SOLN: INTRAVENOUS | Status: DC

## 2020-05-04 MED ORDER — LACTATED RINGERS IV BOLUS
2000.0000 mL | Freq: Once | INTRAVENOUS | Status: AC
  Administered 2020-05-04: 2000 mL via INTRAVENOUS

## 2020-05-04 MED ORDER — HEPARIN SODIUM (PORCINE) 5000 UNIT/ML IJ SOLN
5000.0000 [IU] | Freq: Three times a day (TID) | INTRAMUSCULAR | Status: DC
  Administered 2020-05-04: 5000 [IU] via SUBCUTANEOUS
  Filled 2020-05-04: qty 1

## 2020-05-04 MED ORDER — ONDANSETRON HCL 4 MG/2ML IJ SOLN
4.0000 mg | Freq: Four times a day (QID) | INTRAMUSCULAR | Status: DC | PRN
  Administered 2020-05-05: 4 mg via INTRAVENOUS
  Filled 2020-05-04: qty 2

## 2020-05-04 MED ORDER — LACTATED RINGERS IV SOLN: INTRAVENOUS | Status: DC

## 2020-05-04 MED ORDER — LEVOTHYROXINE SODIUM 50 MCG PO TABS
25.0000 ug | ORAL_TABLET | Freq: Every day | ORAL | Status: DC
  Administered 2020-05-06 – 2020-05-07 (×2): 25 ug via ORAL
  Filled 2020-05-04 (×2): qty 1

## 2020-05-04 NOTE — Progress Notes (Addendum)
Pharmacy Antibiotic Note  Colleen Payne is a 70 y.o. female with PMH for T1DM, afib, mitral valve prolapse, hypothyroidism, and HTN who was admitted on 05/04/2020 with aspiration pneumonia. Patient received one dose of ceftriaxone and was also started on azithromycin. Pharmacy has been consulted for Unasyn dosing. Lactic acid 3.1, WBC 16.2, 24hTmax 97.4F; Scr 1.70.  4/14 CXR: atelectasis versus consolidation LEFT lower lobe  Plan: Start Unasyn 3g IV q12h  Monitor renal function and adjust dose as clinically indicated  Height: 5\' 5"  (165.1 cm) Weight: 46.4 kg (102 lb 4.7 oz) IBW/kg (Calculated) : 57  Temp (24hrs), Avg:97.2 F (36.2 C), Min:97.2 F (36.2 C), Max:97.2 F (36.2 C)  Recent Labs  Lab 05/04/20 0932 05/04/20 1059 05/04/20 1204  WBC 16.2*  --   --   CREATININE 2.17*  --  1.70*  LATICACIDVEN  --  3.1*  --     Estimated Creatinine Clearance: 22.6 mL/min (A) (by C-G formula based on SCr of 1.7 mg/dL (H)).    Allergies  Allergen Reactions  . Lactose Other (See Comments)    Other Reaction: stomach problems Other Reaction: stomach problems  . Aspirin Other (See Comments)    stomach problems and cramps  . Codeine Other (See Comments)    dizziness  . Contrast Media [Iodinated Diagnostic Agents] Other (See Comments)    Reddened skin - face and chest. Felt hot, burning sensation  . Gadolinium Derivatives Other (See Comments)    Pt c/o nasal stuffiness and flushed. Pt face and upper chest were redden, no hives.    Antimicrobials this admission: 4/14 Ceftriaxone x1 4/14 Azithromycin >> 4/14 Unasyn >>  Dose adjustments this admission:   Microbiology results: 4/14 BCx: pending 4/14 Sputum: pending  Thank you for allowing pharmacy to be a part of this patient's care.  Sherilyn Banker, PharmD Pharmacy Resident  05/04/2020 2:49 PM

## 2020-05-04 NOTE — ED Notes (Signed)
I set of cultures obtained at this time. MD aware.

## 2020-05-04 NOTE — Progress Notes (Signed)
PHARMACY CONSULT NOTE - FOLLOW UP  Pharmacy Consult for Electrolyte Monitoring and Replacement   Recent Labs: Potassium (mmol/L)  Date Value  05/04/2020 3.4 (L)   Magnesium (mg/dL)  Date Value  05/04/2020 2.2   Calcium (mg/dL)  Date Value  05/04/2020 9.0   Albumin (g/dL)  Date Value  05/04/2020 4.6   Phosphorus (mg/dL)  Date Value  06/14/2016 4.7 (H)   Sodium (mmol/L)  Date Value  05/04/2020 143     Assessment: 70yo female with PMH for T1DM, afib, mitral valve prolapse, hypothyroidism, HTN who was admitted with severe DKA. Pharmacy has been consulted for electrolyte monitoring and replacement.   Insulin gtt LR @125ml /hr  Goal of Therapy:  K 4.0 - 5.1 mmol/L Mg 2.0 - 2.4 mmol/L All other electrolytes WNL   Plan:   Will give KCl 10 mEq IV x 4.   Recheck electrolytes with AM labs and replace as needed   Oswald Hillock, PharmD 05/04/2020 11:53 PM

## 2020-05-04 NOTE — ED Provider Notes (Addendum)
[facility] Emergency Department Provider Note    Event Date/Time   First MD Initiated Contact with Patient 05/04/20 [id number]     (approximate)  I have reviewed the triage vital signs and the nursing notes.   HISTORY  Chief Complaint Hyperglycemia  Level V Caveat:  AMS  HPI Colleen Payne is a 70 y.o. female below listed past medical history brought in by EMS due to altered mental status since yesterday.  Reportedly was in endocrinology clinic with elevated blood sugars.  Patient has had some dependent diabetic with insulin pump reportedly.  Patient very drowsy and somnolent but protecting her airway.  Unable to provide any additional history.   Daughter states that patient was having endocrinology clinic yesterday due to high blood sugars over the past week or so reportedly patient not been bolusing herself appropriate insulin.  Overnight at some point her insulin pump was disconnected she was found to be vomiting and less coherent at that point EMS was called.   Past Medical History:  Diagnosis Date  . Anxiety   . Atrial fibrillation ([place])   . Depression   . Diabetes mellitus without complication (HCC)    Type I   . Headache    stress. 1x/month  . Hypertension   . Insulin pump in place   . Motion sickness    cars  . MVP (mitral valve prolapse)   . Neuromuscular disorder (HCC)    leg weakness s/p back surgeries  . Thyroid disease    Family History  Problem Relation Age of Onset  . Hypertension Mother   . Cancer Mother        Colon  . Hypertension Father    Past Surgical History:  Procedure Laterality Date  . BACK SURGERY  2004   3 sugeries between Sept and Nov, fusion L2-S1  . CATARACT EXTRACTION W/PHACO Left 11/23/2014   Procedure: CATARACT EXTRACTION PHACO AND INTRAOCULAR LENS PLACEMENT (IOC);  Surgeon: Leandrew Koyanagi, MD;  Location: [place];  Service: Ophthalmology;  Laterality: Left;  DIABETIC - insulin pump   Patient  Active Problem List   Diagnosis Date Noted  . Central sleep apnea 10/09/2016  . Hypertension 10/09/2016  . Lumbar postlaminectomy syndrome 10/09/2016  . Lumbar radiculopathy 10/09/2016  . Diabetic polyneuropathy ([place]) 10/07/2016  . Right lumbosacral radiculopathy 10/07/2016  . Healthcare maintenance 08/26/2016  . DKA (diabetic ketoacidoses) 06/14/2016  . Atypical chest pain 02/23/2016  . SOB (shortness of breath) 02/23/2016  . Pain medication agreement signed 01/13/2015  . Acquired hypothyroidism 05/30/2014  . Frontal sinusitis 12/17/2013  . Atrial fibrillation ([place]) 11/29/2013  . Major depression in remission ([place]) 11/20/2013  . 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 Chronic pain syndrome 08/27/2013  . Back pain 07/17/2013  . HTN (hypertension), benign 07/17/2013  . Hyperlipidemia 07/17/2013  . Pancreatic insufficiency 07/17/2013  . Chronic postoperative pain 01/01/2013  . Long term current use of opiate analgesic 11/04/2012  . Hypothyroidism 08/25/2012  . Radiculopathy of leg 05/01/2011      Prior to Admission medications   Medication Sig Start Date End Date Taking? Authorizing Provider  amitriptyline (ELAVIL) 25 MG tablet Take 25 mg by mouth as needed.  01/26/10   [provider]  Buprenorphine (BUTRANS) 15 MCG/HR PTWK Place 15 mcg onto the skin once a week.     [provider]  glucose blood (ONE TOUCH ULTRA TEST) test strip USE TO TEST BLOOD SUGAR 8 TIMES  DAILY. DX: E10.42 04/17/15   Philemon Kingdom, MD  HUMALOG 100 UNIT/ML injection INJECT 1ML (100 UNITS) INTO THE SKIN DAILY. FOR USE IN PUMP 10/25/15   Philemon Kingdom, MD  hydrochlorothiazide (HYDRODIURIL) 25 MG tablet Take 25 mg by mouth daily.  08/13/13 06/14/17  [provider]  insulin aspart (NOVOLOG) 100 UNIT/ML injection Use 100 units daily via insulin pump. Patient not taking: Reported on 06/14/2016 05/19/15   Philemon Kingdom, MD  levothyroxine  (SYNTHROID, LEVOTHROID) 25 MCG tablet Take 25 mcg by mouth daily before breakfast.  10/15/11   [provider]  metoprolol succinate (TOPROL-XL) 50 MG 24 hr tablet Take 150 mg by mouth daily with breakfast. 1 tablet with dinner. 10/15/11   [provider]  morphine (MSIR) 15 MG tablet Take 15 mg by mouth every 6 (six) hours as needed for moderate pain or severe pain.  06/04/13   [provider]  oxycodone-acetaminophen (LYNOX) 10-300 MG tablet Take 1 tablet by mouth 3 (three) times daily.    [provider]  ramipril (ALTACE) 10 MG capsule Take 10 mg by mouth daily with breakfast.  02/14/11   [provider]  warfarin (COUMADIN) 1 MG tablet Take 1 mg by mouth daily.    [provider]  warfarin (COUMADIN) 6 MG tablet Take 6 mg by mouth daily with breakfast.  09/21/13 06/14/17  [provider]    Allergies Aspirin, Codeine, Contrast media [iodinated diagnostic agents], and Gadolinium derivatives    Social History Social History   Tobacco Use  . Smoking status: Former Smoker    Start date: 10/26/2001  . Smokeless tobacco: Never Used  Substance Use Topics  . Alcohol use: No  . Drug use: No    Review of Systems Patient denies headaches, rhinorrhea, blurry vision, numbness, shortness of breath, chest pain, edema, cough, abdominal pain, nausea, vomiting, diarrhea, dysuria, fevers, rashes or hallucinations unless otherwise stated above in HPI. ____________________________________________   PHYSICAL EXAM:  VITAL SIGNS: Vitals:   05/04/20 1100 05/04/20 1130  BP: (!) 130/59 (!) 134/50  Pulse: 76 68  Resp: 11 10  Temp:    SpO2: 100% 100%    Constitutional: drowsy, ill appearing, protecting airway Eyes: Conjunctivae are normal.  Head: Atraumatic. Nose: No congestion/rhinnorhea. Mouth/Throat: Mucous membranes are moist.   Neck: No stridor. Painless ROM.  Cardiovascular: Normal rate, regular rhythm. Grossly normal heart sounds.   Good peripheral circulation. Respiratory: Normal respiratory effort.  No retractions. Lungs CTAB. Gastrointestinal: Soft and nontender. No distention. No abdominal bruits. No CVA tenderness. Genitourinary: deferred Musculoskeletal: No lower extremity tenderness nor edema.  No joint effusions. Neurologic:  Drowsy, unable to cooperate with exam but appears to MAE spontaneously, No facial droop Skin:  Skin is warm, dry and intact. No rash noted. Psychiatric: unablet o assess  ____________________________________________   LABS (all labs ordered are listed, but only abnormal results are displayed)  Results for orders placed or performed during the hospital encounter of 05/04/20 (from the past 24 hour(s))  CBG monitoring, ED     Status: Abnormal   Collection Time: 05/04/20  9:30 AM  Result Value Ref Range   Glucose-Capillary >600 (HH) 70 - 99 mg/dL  CBC     Status: Abnormal   Collection Time: 05/04/20  9:32 AM  Result Value Ref Range   WBC 16.2 (H) 4.0 - 10.5 K/uL   RBC 4.25 3.87 - 5.11 MIL/uL   Hemoglobin 14.5 12.0 - 15.0 g/dL   HCT 45.2 36.0 - 46.0 %  MCV 106.4 (H) 80.0 - 100.0 fL   MCH 34.1 (H) 26.0 - 34.0 pg   MCHC 32.1 30.0 - 36.0 g/dL   RDW 14.1 11.5 - 15.5 %   Platelets 365 150 - 400 K/uL   nRBC 0.0 0.0 - 0.2 %  Comprehensive metabolic panel     Status: Abnormal   Collection Time: 05/04/20  9:32 AM  Result Value Ref Range   Sodium 135 135 - 145 mmol/L   Potassium 4.6 3.5 - 5.1 mmol/L   Chloride 89 (L) 98 - 111 mmol/L   CO2 11 (L) 22 - 32 mmol/L   Glucose, Bld 1,027 (HH) 70 - 99 mg/dL   BUN 62 (H) 8 - 23 mg/dL   Creatinine, Ser 2.17 (H) 0.44 - 1.00 mg/dL   Calcium 10.1 8.9 - 10.3 mg/dL   Total Protein 7.8 6.5 - 8.1 g/dL   Albumin 4.6 3.5 - 5.0 g/dL   AST 41 15 - 41 U/L   ALT 29 0 - 44 U/L   Alkaline Phosphatase 108 38 - 126 U/L   Total Bilirubin 2.8 (H) 0.3 - 1.2 mg/dL   GFR, Estimated 24 (L) >60 mL/min   Anion gap 35 (H) 5 - 15  Urinalysis, Complete w  Microscopic     Status: Abnormal   Collection Time: 05/04/20  9:54 AM  Result Value Ref Range   Color, Urine STRAW (A) YELLOW   APPearance CLEAR (A) CLEAR   Specific Gravity, Urine 1.017 1.005 - 1.030   pH 5.0 5.0 - 8.0   Glucose, UA >=500 (A) NEGATIVE mg/dL   Hgb urine dipstick SMALL (A) NEGATIVE   Bilirubin Urine NEGATIVE NEGATIVE   Ketones, ur 80 (A) NEGATIVE mg/dL   Protein, ur NEGATIVE NEGATIVE mg/dL   Nitrite NEGATIVE NEGATIVE   Leukocytes,Ua NEGATIVE NEGATIVE   RBC / HPF 0-5 0 - 5 RBC/hpf   WBC, UA 0-5 0 - 5 WBC/hpf   Bacteria, UA RARE (A) NONE SEEN   Squamous Epithelial / LPF NONE SEEN 0 - 5  Blood gas, venous     Status: Abnormal   Collection Time: 05/04/20  9:54 AM  Result Value Ref Range   pH, Ven 7.12 (LL) 7.250 - 7.430   pCO2, Ven 26 (L) 44.0 - 60.0 mmHg   pO2, Ven 48.0 (H) 32.0 - 45.0 mmHg   Bicarbonate 8.5 (L) 20.0 - 28.0 mmol/L   Acid-base deficit 19.4 (H) 0.0 - 2.0 mmol/L   O2 Saturation 67.0 %   Patient temperature 37.0    Collection site VEIN    Sample type VENOUS   Resp Panel by RT-PCR (Flu A&B, Covid) Nasopharyngeal Swab     Status: None   Collection Time: 05/04/20  9:54 AM   Specimen: Nasopharyngeal Swab; Nasopharyngeal(NP) swabs in vial transport medium  Result Value Ref Range   SARS Coronavirus 2 by RT PCR NEGATIVE NEGATIVE   Influenza A by PCR NEGATIVE NEGATIVE   Influenza B by PCR NEGATIVE NEGATIVE  Osmolality     Status: Abnormal   Collection Time: 05/04/20 10:59 AM  Result Value Ref Range   Osmolality 375 (HH) 275 - 295 mOsm/kg  Lactic acid, plasma     Status: Abnormal   Collection Time: 05/04/20 10:59 AM  Result Value Ref Range   Lactic Acid, Venous 3.1 (HH) 0.5 - 1.9 mmol/L   ____________________________________________  EKG My review and personal interpretation at Time: 9:46   Indication: ams  Rate: 80  Rhythm: sinus Axis: normal Other: inferolateral  t wave abn, no stemi ____________________________________________  RADIOLOGY  I  personally reviewed all radiographic images ordered to evaluate for the above acute complaints and reviewed radiology reports and findings.  These findings were personally discussed with the patient.  Please see medical record for radiology report.  ____________________________________________   PROCEDURES  Procedure(s) performed:  .Critical Care Performed by: Merlyn Lot, MD Authorized by: Merlyn Lot, MD   Critical care provider statement:    Critical care time (minutes):  45   Critical care time was exclusive of:  Separately billable procedures and treating other patients   Critical care was necessary to treat or prevent imminent or life-threatening deterioration of the following conditions:  Endocrine crisis   Critical care was time spent personally by me on the following activities:  Development of treatment plan with patient or surrogate, discussions with consultants, evaluation of patient's response to treatment, examination of patient, obtaining history from patient or surrogate, ordering and performing treatments and interventions, ordering and review of laboratory studies, ordering and review of radiographic studies, pulse oximetry, re-evaluation of patient's condition and review of old charts      Critical Care performed: yes ____________________________________________   INITIAL IMPRESSION / [place] / ED COURSE  Pertinent labs & imaging results that were available during my care of the patient were reviewed by me and considered in my medical decision making (see chart for details).   DDX: Dehydration, sepsis, pna, uti, hypoglycemia, cva, drug effect, withdrawal, encephalitis   Colleen Payne is a 70 y.o. who presents to the ED with presentation as described above.  Patient ill-appearing.  I suspect DKA.  Given altered mental status without rule out ulcer order CT imaging and blood work for above differential.  Have a lower suspicion for CVA.  No  report of trauma.  The patient will be placed on continuous pulse oximetry and telemetry for monitoring.  Laboratory evaluation will be sent to evaluate for the above complaints.     Clinical Course as of 05/04/20 1238  Thu May 04, 2020  1042 Patient's mentation is improving after IV hydration.  CT imaging is reassuring.  Chest x-ray with possible pneumonia.  Given white count altered mental status will cover for pneumonia.  Still awaiting metabolic panel. [PR]  0630 Patient with significant high anion gap metabolic acidosis with significant hyperglycemia.  Will will give continued IV hydration fluid resuscitation.  Will initiate insulin infusion.  Will discuss with hospitalist for admission. [PR]  1235 Given patient's lactic acidemia as well as hyperosmolality do feel patient will need higher level of care.  Will consult ICU. [PR]    Clinical Course User Index [PR] Merlyn Lot, MD    The patient was evaluated in Emergency Department today for the symptoms described in the history of present illness. He/she was evaluated in the context of the global COVID-19 pandemic, which necessitated consideration that the patient might be at risk for infection with the SARS-CoV-2 virus that causes COVID-19. Institutional protocols and algorithms that pertain to the evaluation of patients at risk for COVID-19 are in a state of rapid change based on information released by regulatory bodies including the CDC and federal and state organizations. These policies and algorithms were followed during the patient's care in the ED.  As part of my medical decision making, I reviewed the following data within the [place] notes reviewed and incorporated, Labs reviewed, notes from prior ED visits and [place] Controlled Substance Database   ____________________________________________  FINAL CLINICAL IMPRESSION(S) / ED DIAGNOSES  Final diagnoses:  Diabetic ketoacidosis without coma  associated with type 1 diabetes mellitus (HCC)  Altered mental status, unspecified altered mental status type      NEW MEDICATIONS STARTED DURING THIS VISIT:  New Prescriptions   No medications on file     Note:  This document was prepared using Dragon voice recognition software and may include unintentional dictation errors.     Merlyn Lot, MD 05/04/20 [phone]

## 2020-05-04 NOTE — Progress Notes (Addendum)
PHARMACY CONSULT NOTE - FOLLOW UP  Pharmacy Consult for Electrolyte Monitoring and Replacement   Recent Labs: Potassium (mmol/L)  Date Value  05/04/2020 3.2 (L)   Magnesium (mg/dL)  Date Value  06/15/2016 1.8   Calcium (mg/dL)  Date Value  05/04/2020 8.8 (L)   Albumin (g/dL)  Date Value  05/04/2020 4.6   Phosphorus (mg/dL)  Date Value  06/14/2016 4.7 (H)   Sodium (mmol/L)  Date Value  05/04/2020 141     Assessment: 70yo female with PMH for T1DM, afib, mitral valve prolapse, hypothyroidism, HTN who was admitted with severe DKA. Pharmacy has been consulted for electrolyte monitoring and replacement.   Insulin gtt LR @125ml /hr  Goal of Therapy:  K 4.0 - 5.1 mmol/L Mg 2.0 - 2.4 mmol/L All other electrolytes WNL   Plan:   K 4.6>3.2 -pt currently on insulin drip; KCl 53mEq x1 already given, will order additional KCl 72mEq x4 runs   Mg added on - follow up and replace if needed  All other electrolytes WNL  Recheck electrolytes with AM labs and replace as needed   Sherilyn Banker, PharmD Pharmacy Resident  05/04/2020 2:35 PM

## 2020-05-04 NOTE — Progress Notes (Signed)
Inpatient Diabetes Program Recommendations  AACE/ADA: New Consensus Statement on Inpatient Glycemic Control (2015)  Target Ranges:  Prepandial:   less than 140 mg/dL      Peak postprandial:   less than 180 mg/dL (1-2 hours)      Critically ill patients:  140 - 180 mg/dL   Lab Results  Component Value Date   GLUCAP 391 (H) 05/04/2020   HGBA1C 8.5 12/30/2014    Review of Glycemic Control Results for Colleen Payne, Colleen Payne (MRN [id number]) as of 05/04/2020 15:17  Ref. Range 05/04/2020 09:30 05/04/2020 12:35 05/04/2020 13:15 05/04/2020 13:51 05/04/2020 15:15  Glucose-Capillary Latest Ref Range: 70 - 99 mg/dL >600 (HH) >600 (HH) 535 (HH) 513 (HH) 391 (H)   Diabetes history: DM 1 Outpatient Diabetes medications:  Insulin pump: She is currently on a Medtronic 630G pump:  Basal rates  Midnight = 0.1  4 AM = 0.4 6 AM = 0.5 1 PM = 0.95 6 PM = 0.725 10 PM = 0.5 TDD basal: 13.35 units  Bolus settings I/C: 16 ISF: 60 Target Glucose: MN = 130, 6 AM = 120-130, and 10 PM = 130  Active insulin time: 4 hours   Current orders for Inpatient glycemic control:  IV insulin/ DKA order set  Inpatient Diabetes Program Recommendations:    Note that patient admitted with DKA.  Of note she just saw her PCP On 4/13 for DM.  Unsure what may have precipitated DKA?  Patient is acidotic, and will not be able to transition off insulin drip until acidosis cleared.   May consider leaving patient on insulin drip until 05/05/20?  If transitioned prior to 05/05/20, may consider transition to SQ insulin regimen such as Lantus 12 units daily, Novolog very sensitive q 4 hours, and Novolog 3 units tid with meals (hold if patient eats less than 50%)  Thanks,  Adah Perl, RN, BC-ADM Inpatient Diabetes Coordinator Pager [phone] (8a-5p)

## 2020-05-04 NOTE — ED Notes (Signed)
Pt presents to ED for hyperglycemia. EMS states pt was seen yesterday at diabetes MD and had was told MD got sugar down to 400's and sent home, family called this morning to check on pt with no answer then went to pt's home to check on pt and found her lethargic with a HI reading on glucometer. Family gave her 4 units [place] regular insulin per EMS.  Pt presents to ED lethargic but arousable to pain and loud voice. Pt is disoriented at this time. Pt placed on cardiac monitor. 2 IV's established IVF started. MD at bedside. VSS.

## 2020-05-04 NOTE — H&P (Signed)
NAME:  Colleen Payne, MRN:  [id number], DOB:  [birth year], LOS: 0 ADMISSION DATE:  05/04/2020, CONSULTATION DATE:  05/04/2020 REFERRING MD:  Dr. Quentin Cornwall, CHIEF COMPLAINT:  Altered Mental Status, Hyperglycemia   Brief Pt Description:  70 year old female with past medical history significant for DKA requiring insulin pump who is admitted with severe DKA, suspected community-acquired pneumonia vs. Aspiration Pneumonia, and acute metabolic encephalopathy.  History of Present Illness:  Colleen Payne is a 70 year old female with a past medical history significant for type 1 diabetes mellitus with insulin pump, atrial fibrillation, mitral valve prolapse on Coumadin, hypothyroidism, hypertension who presented to [facility] ED on 05/04/2020 due to altered mental status.  Patient is currently obtunded, therefore history is obtained from patient's daughter Colleen Payne and ED and nursing notes.  Per patient's daughter, patient has had some altered mental status and hyperglycemia the last couple days.  She was seen at the endocrinology clinic yesterday for evaluation of elevated blood sugars and insulin pump.  Reportedly the patient has not been bolusing herself appropriately with insulin for meal coverage.  Overnight her insulin pump became disconnected, and she was found this morning by her son severely altered and vomiting.  ED Course:  Upon presentation to the ED she was noted to be severely drowsy, but protecting her airway.  Work-up shows glucose 1027, bicarb 11, anion gap 35, BUN 62, creatinine 2.17, chloride 89, WBC 16.2, lactic acid 3.1, venous blood gas: pH 7.12/PCO2 26,/PO2 48/bicarb 8.5.  Urinalysis is positive for ketones, but not consistent with UTI.  Chest x-ray is concerning for left lower lobe consolidation versus atelectasis.  CT head is normal.  Her COVID-19 PCR and influenza PCR both negative.  In the ED she was started on aggressive IV fluids and placed on insulin drip, as well as received empiric  azithromycin and Rocephin for pneumonia coverage.  PCCM is asked to admit the patient to stepdown unit for further work-up and treatment of severe DKA, suspected community-acquired pneumonia vs. Aspiration Pneumonia, and acute metabolic encephalopathy.  Pertinent  Medical History  Diabetes mellitus type 1 with insulin pump Thyroid disease Mitral valve prolapse on Coumadin Atrial fibrillation Hypertension Headache Depression Anxiety   Cultures:  05/04/2020: SARS-CoV-2 PCR>> negative 05/04/2020: Influenza PCR> negative 05/04/2020: Blood culture>> 05/04/2020: Sputum 05/04/2020: Strep pneumo urinary antigen>>> 05/04/2020: Legionella urinary antigen>>  Antimicrobials:  Azithromycin 4/14>> Ceftriaxone 4/14 x1 dose Unasyn 4/14>>  Significant Hospital Events: Including procedures, antibiotic start and stop dates in addition to other pertinent events   . 05/04/20: Admission to stepdown unit for severe DKA  Interim History / Subjective:  -Presents to ED with AMS (obtunded) and Hyperglycemia -Found to have severe DKA, admit to Stepdown -Critically ill, obtunded, afebrile, hemodynamically stable on room air -Daughter Colleen Payne updated at bedside  Objective   Blood pressure (!) 126/41, pulse 73, temperature (!) 97.2 F (36.2 C), temperature source Axillary, resp. rate 15, height 5\' 5"  (1.651 m), weight 46.4 kg, SpO2 100 %.       No intake or output data in the 24 hours ending 05/04/20 1313 Filed Weights   05/04/20 0931  Weight: 46.4 kg    Examination: General: Critically ill appearing female, laying in bed, obtunded, on room air, no acute distress HENT: Atraumatic, normocephalic, neck supple, no JVD, dry mucous membranes Lungs: Clear to auscultation bilaterally, even, nonlabored Cardiovascular: Regular rate and rhythm, S1-S2, no murmurs, rubs, gallops 1+ distal pulses Abdomen: Soft, tender, nondistended no guarding or rebound tenderness Extremities: Normal bulk and tone, no deformities,  no edema Neuro: Obtunded, moans and withdraws to painful stimuli, pupils PERRLA GU: Deferred Skin: Warm and dry.  No obvious rashes, lesions, ulcerations  Labs/imaging that I havepersonally reviewed  (right click and "Reselect all SmartList Selections" daily)  Labs 05/04/2020: Bicarb 11, anion gap 35, glucose 1027, BUN 62, creatinine 2.17, WBC 16.2, beta hydroxybutyric acid greater than 8, lactic acid 3.1 Venous blood gas: pH 7.12/PCO2 26/PO2 48/bicarb 8.5 Chest x-ray 4/14>>Normal heart size, mediastinal contours, and pulmonary vascularity. Atelectasis versus consolidation medial LEFT lower lobe.Remaining lungs clear. No pleural effusion or pneumothorax.Bones demineralized. CT head 4/14>>Normal for age noncontrast CT appearance of the brain.   Resolved Hospital Problem list   N/A  Assessment & Plan:   Severe DKA -Follow DKA protocol -Aggressive IV fluid resuscitation -Insulin gtt -Follow serial BMP q4h and Beta-Hydroxybutric acid q8h -Once Anion gap closed and Bicarb >20, can convert off insulin gtt -Check Hgb A1c -Consult Diabetes Coordinator, appreciate input ~ will need evaluation of insulin pump  Suspected Community Acquired Pneumonia vs Aspiration Pneumonia -Monitor fever curve -Trend WBCs and procalcitonin -Follow cultures as above -Received azithromycin and Rocephin in the ED, will continue azithromycin and add Unasyn for now pending cultures sensitivities  AKI Anion Gap Metabolic Acidosis due to DKA and Lactic acidosis Hypokalemia -Monitor I&O's / urinary output -Follow BMP -Ensure adequate renal perfusion -Avoid nephrotoxic agents as able -Replace electrolytes as indicated -Aggressive IV fluids -Trend lactic acid -Pharmacy consulted for assistance with Electrolyte replacement  Acute metabolic encephalopathy, suspect due to severe DKA -Provide supportive care -Avoid sedating meds as able -Treat metabolic abnormalities and DKA -CT head normal  05/04/2020 -Check urine drug screen ~positive for opiates  PMHx of atrial fibrillation and mitral valve prolapse on Coumadin  -Continuous cardiac monitoring -Check INR -Pending level of INR, may have to start heparin drip until patient mental status permits resuming Coumadin therapy  Hypothyroidism -Continue home Synthroid -Check TSH   Best practice (right click and "Reselect all SmartList Selections" daily)  Diet:  NPO Pain/Anxiety/Delirium protocol (if indicated): No VAP protocol (if indicated): Not indicated DVT prophylaxis: Subcutaneous Heparin GI prophylaxis: N/A Glucose control:  Insulin gtt Central venous access:  N/A Arterial line:  N/A Foley:  N/A Mobility:  bed rest  PT consulted: N/A Last date of multidisciplinary goals of care discussion [N/A] Code Status:  full code Disposition: Stepdown  Updated pt's daughter Colleen Payne at bedside 05/04/20  Labs   CBC: Recent Labs  Lab 05/04/20 0932  WBC 16.2*  HGB 14.5  HCT 45.2  MCV 106.4*  PLT 119    Basic Metabolic Panel: Recent Labs  Lab 05/04/20 0932 05/04/20 1204  NA 135 141  K 4.6 3.2*  CL 89* 101  CO2 11* 15*  GLUCOSE 1,027* 593*  BUN 62* 52*  CREATININE 2.17* 1.70*  CALCIUM 10.1 8.8*   GFR: Estimated Creatinine Clearance: 22.6 mL/min (A) (by C-G formula based on SCr of 1.7 mg/dL (H)). Recent Labs  Lab 05/04/20 0932 05/04/20 1059  WBC 16.2*  --   LATICACIDVEN  --  3.1*    Liver Function Tests: Recent Labs  Lab 05/04/20 0932  AST 41  ALT 29  ALKPHOS 108  BILITOT 2.8*  PROT 7.8  ALBUMIN 4.6   No results for input(s): LIPASE, AMYLASE in the last 168 hours. No results for input(s): AMMONIA in the last 168 hours.  ABG    Component Value Date/Time   HCO3 16.4 (L) 05/04/2020 1238   ACIDBASEDEF 9.5 (H) 05/04/2020 1238   O2SAT  67.0 05/04/2020 1238     Coagulation Profile: No results for input(s): INR, PROTIME in the last 168 hours.  Cardiac Enzymes: No results for input(s): CKTOTAL,  CKMB, CKMBINDEX, TROPONINI in the last 168 hours.  HbA1C: Hemoglobin A1C  Date/Time Value Ref Range Status  12/30/2014 11:32 AM 8.5  Final   Hgb A1c MFr Bld  Date/Time Value Ref Range Status  05/06/2014 11:03 AM 8.0 (H) 4.6 - 6.5 % Final    Comment:    Glycemic Control Guidelines for People with Diabetes:Non Diabetic:  <6%Goal of Therapy: <7%Additional Action Suggested:  >8%     CBG: Recent Labs  Lab 05/04/20 0930 05/04/20 1235  GLUCAP >600* >600*    Review of Systems:   Unable to assess due to AMS and Critical Illness   Past Medical History:  She,  has a past medical history of Anxiety, Atrial fibrillation ([place]), Depression, Diabetes mellitus without complication ([place]), Headache, Hypertension, Insulin pump in place, Motion sickness, MVP (mitral valve prolapse), Neuromuscular disorder ([place]), and Thyroid disease.   Surgical History:   Past Surgical History:  Procedure Laterality Date  . BACK SURGERY  2004   3 sugeries between Sept and Nov, fusion L2-S1  . CATARACT EXTRACTION W/PHACO Left 11/23/2014   Procedure: CATARACT EXTRACTION PHACO AND INTRAOCULAR LENS PLACEMENT (IOC);  Surgeon: Leandrew Koyanagi, MD;  Location: [facility];  Service: Ophthalmology;  Laterality: Left;  DIABETIC - insulin pump     Social History:   reports that she has quit smoking. She started smoking about 18 years ago. She has never used smokeless tobacco. She reports that she does not drink alcohol and does not use drugs.   Family History:  Her family history includes Cancer in her mother; Hypertension in her father and mother.   Allergies Allergies  Allergen Reactions  . Aspirin Other (See Comments)    stomach problems and cramps  . Codeine Other (See Comments)    dizziness  . Contrast Media [Iodinated Diagnostic Agents] Other (See Comments)    Reddened skin - face and chest. Felt hot, burning sensation  . Gadolinium Derivatives Other (See Comments)    Pt c/o nasal stuffiness  and flushed. Pt face and upper chest were redden, no hives.     Home Medications  Prior to Admission medications   Medication Sig Start Date End Date Taking? Authorizing Provider  amitriptyline (ELAVIL) 25 MG tablet Take 25 mg by mouth as needed.  01/26/10   [provider]  Buprenorphine (BUTRANS) 15 MCG/HR PTWK Place 15 mcg onto the skin once a week.     [provider]  glucose blood (ONE TOUCH ULTRA TEST) test strip USE TO TEST BLOOD SUGAR 8 TIMES DAILY. DX: E10.42 04/17/15   Philemon Kingdom, MD  HUMALOG 100 UNIT/ML injection INJECT 1ML (100 UNITS) INTO THE SKIN DAILY. FOR USE IN PUMP 10/25/15   Philemon Kingdom, MD  hydrochlorothiazide (HYDRODIURIL) 25 MG tablet Take 25 mg by mouth daily.  08/13/13 06/14/17  [provider]  insulin aspart (NOVOLOG) 100 UNIT/ML injection Use 100 units daily via insulin pump. Patient not taking: Reported on 06/14/2016 05/19/15   Philemon Kingdom, MD  levothyroxine (SYNTHROID, LEVOTHROID) 25 MCG tablet Take 25 mcg by mouth daily before breakfast.  10/15/11   [provider]  metoprolol succinate (TOPROL-XL) 50 MG 24 hr tablet Take 150 mg by mouth daily with breakfast. 1 tablet with dinner. 10/15/11   [provider]  morphine (MSIR) 15 MG tablet Take 15 mg by mouth  every 6 (six) hours as needed for moderate pain or severe pain.  06/04/13   [provider]  oxycodone-acetaminophen (LYNOX) 10-300 MG tablet Take 1 tablet by mouth 3 (three) times daily.    [provider]  ramipril (ALTACE) 10 MG capsule Take 10 mg by mouth daily with breakfast.  02/14/11   [provider]  warfarin (COUMADIN) 1 MG tablet Take 1 mg by mouth daily.    [provider]  warfarin (COUMADIN) 6 MG tablet Take 6 mg by mouth daily with breakfast.  09/21/13 06/14/17  [provider]     Critical care time: 50 minutes    Darel Hong, AGACNP-BC [place] epic  messenger for cross cover needs If after hours, please call E-link

## 2020-05-04 NOTE — ED Notes (Signed)
Informed RN bed assigned [id number]

## 2020-05-04 NOTE — ED Notes (Signed)
X RAY at bedside 

## 2020-05-04 NOTE — ED Notes (Signed)
Pt at CT

## 2020-05-04 NOTE — ED Notes (Signed)
CBG results at over 600, MD aware, bolus started

## 2020-05-04 NOTE — ED Notes (Signed)
Pt lethargic and unable to sign MSE at this time

## 2020-05-05 ENCOUNTER — Ambulatory Visit: Payer: Medicare Other | Admitting: Physical Therapy

## 2020-05-05 DIAGNOSIS — J690 Pneumonitis due to inhalation of food and vomit: Secondary | ICD-10-CM | POA: Diagnosis not present

## 2020-05-05 DIAGNOSIS — R791 Abnormal coagulation profile: Secondary | ICD-10-CM

## 2020-05-05 DIAGNOSIS — E1110 Type 2 diabetes mellitus with ketoacidosis without coma: Secondary | ICD-10-CM

## 2020-05-05 LAB — CBC
HCT: 30.3 % — ABNORMAL LOW (ref 36.0–46.0)
Hemoglobin: 11 g/dL — ABNORMAL LOW (ref 12.0–15.0)
MCH: 34.7 pg — ABNORMAL HIGH (ref 26.0–34.0)
MCHC: 36.3 g/dL — ABNORMAL HIGH (ref 30.0–36.0)
MCV: 95.6 fL (ref 80.0–100.0)
Platelets: 224 10*3/uL (ref 150–400)
RBC: 3.17 MIL/uL — ABNORMAL LOW (ref 3.87–5.11)
RDW: 13.9 % (ref 11.5–15.5)
WBC: 18.4 10*3/uL — ABNORMAL HIGH (ref 4.0–10.5)
nRBC: 0 % (ref 0.0–0.2)

## 2020-05-05 LAB — PROCALCITONIN: Procalcitonin: 1.14 ng/mL

## 2020-05-05 LAB — BASIC METABOLIC PANEL
Anion gap: 7 (ref 5–15)
BUN: 36 mg/dL — ABNORMAL HIGH (ref 8–23)
CO2: 28 mmol/L (ref 22–32)
Calcium: 9.2 mg/dL (ref 8.9–10.3)
Chloride: 108 mmol/L (ref 98–111)
Creatinine, Ser: 0.78 mg/dL (ref 0.44–1.00)
GFR, Estimated: >60 mL/min (ref >60)
Glucose, Bld: 157 mg/dL — ABNORMAL HIGH (ref 70–99)
Potassium: 3.3 mmol/L — ABNORMAL LOW (ref 3.5–5.1)
Sodium: 143 mmol/L (ref 135–145)

## 2020-05-05 LAB — GLUCOSE, CAPILLARY
Glucose-Capillary: 115 mg/dL — ABNORMAL HIGH (ref 70–99)
Glucose-Capillary: 126 mg/dL — ABNORMAL HIGH (ref 70–99)
Glucose-Capillary: 136 mg/dL — ABNORMAL HIGH (ref 70–99)
Glucose-Capillary: 140 mg/dL — ABNORMAL HIGH (ref 70–99)
Glucose-Capillary: 148 mg/dL — ABNORMAL HIGH (ref 70–99)
Glucose-Capillary: 154 mg/dL — ABNORMAL HIGH (ref 70–99)
Glucose-Capillary: 159 mg/dL — ABNORMAL HIGH (ref 70–99)
Glucose-Capillary: 165 mg/dL — ABNORMAL HIGH (ref 70–99)
Glucose-Capillary: 165 mg/dL — ABNORMAL HIGH (ref 70–99)
Glucose-Capillary: 170 mg/dL — ABNORMAL HIGH (ref 70–99)
Glucose-Capillary: 173 mg/dL — ABNORMAL HIGH (ref 70–99)
Glucose-Capillary: 218 mg/dL — ABNORMAL HIGH (ref 70–99)
Glucose-Capillary: 230 mg/dL — ABNORMAL HIGH (ref 70–99)
Glucose-Capillary: 230 mg/dL — ABNORMAL HIGH (ref 70–99)

## 2020-05-05 LAB — HEMOGLOBIN A1C
Hgb A1c MFr Bld: 11.2 % — ABNORMAL HIGH (ref 4.8–5.6)
Hgb A1c MFr Bld: 10.6 % — ABNORMAL HIGH (ref 4.8–5.6)
Mean Plasma Glucose: 275 mg/dL
Mean Plasma Glucose: 257.52 mg/dL

## 2020-05-05 LAB — TSH: TSH: 0.359 u[IU]/mL (ref 0.350–4.500)

## 2020-05-05 LAB — HIV ANTIBODY (ROUTINE TESTING W REFLEX): HIV Screen 4th Generation wRfx: NONREACTIVE

## 2020-05-05 LAB — LEGIONELLA PNEUMOPHILA SEROGP 1 UR AG: L. pneumophila Serogp 1 Ur Ag: NEGATIVE

## 2020-05-05 LAB — PHOSPHORUS: Phosphorus: 1.4 mg/dL — ABNORMAL LOW (ref 2.5–4.6)

## 2020-05-05 LAB — MAGNESIUM: Magnesium: 1.7 mg/dL (ref 1.7–2.4)

## 2020-05-05 LAB — STREP PNEUMONIAE URINARY ANTIGEN: Strep Pneumo Urinary Antigen: NEGATIVE

## 2020-05-05 LAB — BETA-HYDROXYBUTYRIC ACID: Beta-Hydroxybutyric Acid: 0.39 mmol/L — ABNORMAL HIGH (ref 0.05–0.27)

## 2020-05-05 LAB — PROTIME-INR
INR: 5.4 (ref 0.8–1.2)
Prothrombin Time: 49.5 seconds — ABNORMAL HIGH (ref 11.4–15.2)

## 2020-05-05 MED ORDER — METOPROLOL SUCCINATE ER 50 MG PO TB24
50.0000 mg | ORAL_TABLET | Freq: Every day | ORAL | Status: DC
  Administered 2020-05-05 – 2020-05-07 (×3): 50 mg via ORAL
  Filled 2020-05-05 (×3): qty 1

## 2020-05-05 MED ORDER — CYCLOBENZAPRINE HCL 10 MG PO TABS
5.0000 mg | ORAL_TABLET | Freq: Two times a day (BID) | ORAL | Status: DC
  Administered 2020-05-05 – 2020-05-07 (×5): 5 mg via ORAL
  Filled 2020-05-05 (×6): qty 1

## 2020-05-05 MED ORDER — RAMIPRIL 10 MG PO CAPS
10.0000 mg | ORAL_CAPSULE | Freq: Every day | ORAL | Status: DC
  Administered 2020-05-05 – 2020-05-07 (×3): 10 mg via ORAL
  Filled 2020-05-05 (×4): qty 1

## 2020-05-05 MED ORDER — INSULIN DETEMIR 100 UNIT/ML ~~LOC~~ SOLN
5.0000 [IU] | Freq: Two times a day (BID) | SUBCUTANEOUS | Status: DC
  Administered 2020-05-05 (×2): 5 [IU] via SUBCUTANEOUS
  Filled 2020-05-05 (×4): qty 0.05

## 2020-05-05 MED ORDER — INSULIN ASPART 100 UNIT/ML ~~LOC~~ SOLN: 2.0000 [IU] | SUBCUTANEOUS | Status: DC

## 2020-05-05 MED ORDER — ADULT MULTIVITAMIN W/MINERALS CH
1.0000 | ORAL_TABLET | Freq: Every day | ORAL | Status: DC
  Administered 2020-05-05 – 2020-05-07 (×3): 1 via ORAL
  Filled 2020-05-05 (×3): qty 1

## 2020-05-05 MED ORDER — LACTATED RINGERS IV SOLN: INTRAVENOUS | Status: AC

## 2020-05-05 MED ORDER — K PHOS MONO-SOD PHOS DI & MONO 155-852-130 MG PO TABS
500.0000 mg | ORAL_TABLET | ORAL | Status: AC
  Administered 2020-05-05: 500 mg via ORAL
  Filled 2020-05-05 (×4): qty 2

## 2020-05-05 MED ORDER — AMITRIPTYLINE HCL 25 MG PO TABS
25.0000 mg | ORAL_TABLET | Freq: Every evening | ORAL | Status: DC | PRN
  Filled 2020-05-05: qty 1

## 2020-05-05 MED ORDER — SODIUM CHLORIDE 0.9 % IV SOLN
3.0000 g | Freq: Four times a day (QID) | INTRAVENOUS | Status: DC
  Administered 2020-05-05 – 2020-05-07 (×8): 3 g via INTRAVENOUS
  Filled 2020-05-05 (×4): qty 8
  Filled 2020-05-05: qty 3
  Filled 2020-05-05 (×4): qty 8
  Filled 2020-05-05: qty 3
  Filled 2020-05-05 (×2): qty 8

## 2020-05-05 MED ORDER — INSULIN ASPART 100 UNIT/ML ~~LOC~~ SOLN
0.0000 [IU] | SUBCUTANEOUS | Status: DC
  Administered 2020-05-05 (×2): 2 [IU] via SUBCUTANEOUS
  Administered 2020-05-06: 6 [IU] via SUBCUTANEOUS
  Filled 2020-05-05 (×3): qty 1

## 2020-05-05 MED ORDER — GABAPENTIN 300 MG PO CAPS
300.0000 mg | ORAL_CAPSULE | Freq: Every day | ORAL | Status: DC
  Administered 2020-05-05 – 2020-05-06 (×2): 300 mg via ORAL
  Filled 2020-05-05 (×2): qty 1

## 2020-05-05 MED ORDER — DULOXETINE HCL 20 MG PO CPEP
40.0000 mg | ORAL_CAPSULE | Freq: Every day | ORAL | Status: DC
  Administered 2020-05-05 – 2020-05-07 (×3): 40 mg via ORAL
  Filled 2020-05-05 (×3): qty 2

## 2020-05-05 NOTE — H&P (Addendum)
NAME:  Colleen Payne, MRN:  [id number], DOB:  Mar 08, 1950, LOS: 1 ADMISSION DATE:  05/04/2020, CONSULTATION DATE:  05/04/2020 REFERRING MD:  Dr. Quentin Cornwall, CHIEF COMPLAINT:  Altered Mental Status, Hyperglycemia   Brief Pt Description:  70 year old female with past medical history significant for DM Type 1 requiring insulin pump who is admitted with severe DKA, suspected community-acquired pneumonia vs. Aspiration Pneumonia, and acute metabolic encephalopathy.  History of Present Illness:  Colleen Payne is a 70 year old female with a past medical history significant for type 1 diabetes mellitus with insulin pump, atrial fibrillation, mitral valve prolapse on Coumadin, hypothyroidism, hypertension who presented to [facility] ED on 05/04/2020 due to altered mental status.  Patient is currently obtunded, therefore history is obtained from patient's daughter Colleen Payne and ED and nursing notes.  Per patient's daughter, patient has had some altered mental status and hyperglycemia the last couple days.  She was seen at the endocrinology clinic yesterday for evaluation of elevated blood sugars and insulin pump.  Reportedly the patient has not been bolusing herself appropriately with insulin for meal coverage.  Overnight her insulin pump became disconnected, and she was found this morning by her son severely altered and vomiting.  ED Course:  Upon presentation to the ED she was noted to be severely drowsy, but protecting her airway.  Work-up shows glucose 1027, bicarb 11, anion gap 35, BUN 62, creatinine 2.17, chloride 89, WBC 16.2, lactic acid 3.1, venous blood gas: pH 7.12/PCO2 26,/PO2 48/bicarb 8.5.  Urinalysis is positive for ketones, but not consistent with UTI.  Chest x-ray is concerning for left lower lobe consolidation versus atelectasis.  CT head is normal.  Her COVID-19 PCR and influenza PCR both negative.  In the ED she was started on aggressive IV fluids and placed on insulin drip, as well as received empiric  azithromycin and Rocephin for pneumonia coverage.  PCCM is asked to admit the patient to stepdown unit for further work-up and treatment of severe DKA, suspected community-acquired pneumonia vs. Aspiration Pneumonia, and acute metabolic encephalopathy.  Pertinent  Medical History  Diabetes mellitus type 1 with insulin pump Thyroid disease Mitral valve prolapse on Coumadin Atrial fibrillation Hypertension Headache Depression Anxiety   Cultures:  05/04/2020: SARS-CoV-2 PCR>> negative 05/04/2020: Influenza PCR> negative 05/04/2020: Blood culture>> 05/04/2020: Sputum>> 05/04/2020: Strep pneumo urinary antigen>>negative 05/04/2020: Legionella urinary antigen>>  Antimicrobials:  Azithromycin 4/14>>4/15 Ceftriaxone 4/14 x1 dose Unasyn 4/14>>  Significant Hospital Events: Including procedures, antibiotic start and stop dates in addition to other pertinent events   . 05/04/20: Admission to stepdown unit for severe DKA . 05/05/20: DKA resolved, transitioned off insulin gtt.  INR 5.4, no signs of bleeding, resume diet, transfer out of ICU  Interim History / Subjective:  No acute events reported overnight Afebrile, hemodynamically stable, on room air Creatine WNL, Recorded urine output 450 ml last 24 hrs DKA resolved, transitioned off insulin gtt INR 5.4 this morning, no signs of bleeding Start diet, mobilize OOB Pt denies chest pain, SOB, cough, abdominal pain, N/V/D, fever, chills, edema Requests SCD's be removed Possible transfer out of ICU this afternoon  Objective   Blood pressure (!) 147/55, pulse 80, temperature 99.4 F (37.4 C), temperature source Oral, resp. rate 13, height 5\' 5"  (1.651 m), weight 45.6 kg, SpO2 95 %.        Intake/Output Summary (Last 24 hours) at 05/05/2020 0816 Last data filed at 05/05/2020 0406 Gross per 24 hour  Intake 5672.31 ml  Output 450 ml  Net 5222.31 ml   Danley Danker  Weights   05/04/20 0931 05/04/20 1400 05/05/20 0500  Weight: 46.4 kg 46.4 kg 45.6  kg    Examination: General: Acutely ill appearing female, sitting in bed, on room air, no acute distress HENT: Atraumatic, normocephalic, neck supple, no JVD, dry mucous membranes Lungs: Clear to auscultation bilaterally, even, nonlabored Cardiovascular: Regular rate and rhythm, S1-S2, no murmurs, rubs, gallops 1+ distal pulses Abdomen: Soft, tender, nondistended no guarding or rebound tenderness,  Extremities: Generalized weakness, Normal bulk and tone, no deformities, no edema Neuro: Awake, Oriented to person and place, moves all extremities to command, no focal deficits, speech clear pupils PERRLA GU: Deferred Skin: Warm and dry.  No obvious rashes, lesions, ulcerations  Labs/imaging that I havepersonally reviewed  (right click and "Reselect all SmartList Selections" daily)  Labs 05/04/2020: K 3.3, Bicarb 28, AG 7, Glucose 157, BUN 36, Cr. 0.78, PCT 1.14, WBC 18.4, Hgb 11, PT 49.5, INR 5.4, Beta-Hydroxybutric acid 0.39, TSH 0.359 Chest x-ray 4/14>>Normal heart size, mediastinal contours, and pulmonary vascularity. Atelectasis versus consolidation medial LEFT lower lobe.Remaining lungs clear. No pleural effusion or pneumothorax.Bones demineralized. CT head 4/14>>Normal for age noncontrast CT appearance of the brain.   Resolved Hospital Problem list   DKA AKI Anion Gap Metabolic acidosis  Assessment & Plan:   Severe DKA>>resolved PMHx of DM Type 1 with insulin pump -CBG's -SSI and Levemir -Follow ICU Hypo/Hyperglycemia protocol -Diabetes Coordinator following, appreciate input -Hgb A1c pending  Suspected Community Acquired Pneumonia vs Aspiration Pneumonia (given background circumstance of pt being Altered and found with vomit, more likely Aspiration than CAP) -Monitor fever curve -Trend WBCs and procalcitonin -Follow cultures as above (strep pneumo is negative) -Will d/c Azithromycin, continue Unasyn for now pending cultures sensitivities  AKI>>resolved Anion Gap  Metabolic Acidosis due to DKA and Lactic acidosis>>resolved Hypokalemia -Monitor I&O's / urinary output -Follow BMP -Ensure adequate renal perfusion -Avoid nephrotoxic agents as able -Replace electrolytes as indicated -IV fluids, can d/c once able to consistently tolerate PO intake -Pharmacy consulted for assistance with Electrolyte replacement  Acute metabolic encephalopathy, suspect due to severe DKA>>improving PMHx of Depression and Anxiety -Provide supportive care -Avoid sedating meds as able -CT head normal 05/04/2020 -Check urine drug screen ~positive for opiates -Will resume home Duloxetine   PMHx of atrial fibrillation and mitral valve prolapse on Coumadin, Hypertension Supratherapeutic INR -Continuous cardiac monitoring -Follow INR ~ INR 5.4 today, HOLD COUMADIN -Monitor for s/sx of bleeding -Given pt has no signs of bleeding, will hold off on Coumadin reversal and let equilibrate on its on; will reverse if active bleeding occurs -Will resume home Metoprolol and Ramipril   Hypothyroidism -Continue home Synthroid -Check TSH~ 0.359   Best practice (right click and "Reselect all SmartList Selections" daily)  Diet:  Carb Modified Pain/Anxiety/Delirium protocol (if indicated): No VAP protocol (if indicated): Not indicated DVT prophylaxis: SCD's (no chemical prophylaxis given supratherapeutic INR) GI prophylaxis: N/A Glucose control:  SSI and Levemir Central venous access:  N/A Arterial line:  N/A Foley:  N/A Mobility:  OOB  PT consulted: N/A Last date of multidisciplinary goals of care discussion [05/05/20] Code Status:  full code Disposition: Stepdown  Updated pt's daughter Colleen Payne at bedside 05/05/20  Labs   CBC: Recent Labs  Lab 05/04/20 0932 05/05/20 0308  WBC 16.2* 18.4*  HGB 14.5 11.0*  HCT 45.2 30.3*  MCV 106.4* 95.6  PLT 365 250    Basic Metabolic Panel: Recent Labs  Lab 05/04/20 0932 05/04/20 1204 05/04/20 1458 05/04/20 1906 05/04/20 2247  05/05/20 0308  NA 135 141  --  143 143 143  K 4.6 3.2*  --  3.7 3.4* 3.3*  CL 89* 101  --  106 105 108  CO2 11* 15*  --  24 29 28   GLUCOSE 1,027* 593*  --  221* 148* 157*  BUN 62* 52*  --  43* 40* 36*  CREATININE 2.17* 1.70*  --  1.06* 1.03* 0.78  CALCIUM 10.1 8.8*  --  9.4 9.0 9.2  MG  --   --  2.2  --   --  1.7  PHOS  --   --   --   --   --  1.4*   GFR: Estimated Creatinine Clearance: 47.1 mL/min (by C-G formula based on SCr of 0.78 mg/dL). Recent Labs  Lab 05/04/20 0932 05/04/20 1059 05/04/20 1458 05/04/20 2247 05/05/20 0308  PROCALCITON  --   --   --  1.27 1.14  WBC 16.2*  --   --   --  18.4*  LATICACIDVEN  --  3.1* 3.0*  --   --     Liver Function Tests: Recent Labs  Lab 05/04/20 0932  AST 41  ALT 29  ALKPHOS 108  BILITOT 2.8*  PROT 7.8  ALBUMIN 4.6   No results for input(s): LIPASE, AMYLASE in the last 168 hours. No results for input(s): AMMONIA in the last 168 hours.  ABG    Component Value Date/Time   HCO3 16.4 (L) 05/04/2020 1238   ACIDBASEDEF 9.5 (H) 05/04/2020 1238   O2SAT 67.0 05/04/2020 1238     Coagulation Profile: Recent Labs  Lab 05/04/20 1458 05/05/20 0308  INR 4.2* 5.4*    Cardiac Enzymes: No results for input(s): CKTOTAL, CKMB, CKMBINDEX, TROPONINI in the last 168 hours.  HbA1C: Hemoglobin A1C  Date/Time Value Ref Range Status  12/30/2014 11:32 AM 8.5  Final   Hgb A1c MFr Bld  Date/Time Value Ref Range Status  05/06/2014 11:03 AM 8.0 (H) 4.6 - 6.5 % Final    Comment:    Glycemic Control Guidelines for People with Diabetes:Non Diabetic:  <6%Goal of Therapy: <7%Additional Action Suggested:  >8%     CBG: Recent Labs  Lab 05/05/20 0221 05/05/20 0223 05/05/20 0430 05/05/20 0631 05/05/20 0732  GLUCAP 173* 165* 154* 140* 136*    Review of Systems:   Positives in BOLD: Pt currently denies all complaints Gen: Denies fever, chills, weight change, fatigue, night sweats HEENT: Denies blurred vision, double vision, hearing  loss, tinnitus, sinus congestion, rhinorrhea, sore throat, neck stiffness, dysphagia PULM: Denies shortness of breath, cough, sputum production, hemoptysis, wheezing CV: Denies chest pain, edema, orthopnea, paroxysmal nocturnal dyspnea, palpitations GI: Denies abdominal pain, nausea, vomiting, diarrhea, hematochezia, melena, constipation, change in bowel habits GU: Denies dysuria, hematuria, polyuria, oliguria, urethral discharge Endocrine: Denies hot or cold intolerance, polyuria, polyphagia or appetite change Derm: Denies rash, dry skin, scaling or peeling skin change Heme: Denies easy bruising, bleeding, bleeding gums Neuro: Denies headache, numbness, weakness, slurred speech, loss of memory or consciousness    Past Medical History:  She,  has a past medical history of Anxiety, Atrial fibrillation ([place]), Depression, Diabetes mellitus without complication ([place]), Headache, Hypertension, Insulin pump in place, Motion sickness, MVP (mitral valve prolapse), Neuromuscular disorder ([place]), and Thyroid disease.   Surgical History:   Past Surgical History:  Procedure Laterality Date  . BACK SURGERY  2004   3 sugeries between Sept and Nov, fusion L2-S1  . CATARACT EXTRACTION W/PHACO Left 11/23/2014   Procedure: CATARACT EXTRACTION PHACO  AND INTRAOCULAR LENS PLACEMENT (IOC);  Surgeon: Leandrew Koyanagi, MD;  Location: [facility];  Service: Ophthalmology;  Laterality: Left;  DIABETIC - insulin pump     Social History:   reports that she has quit smoking. She started smoking about 18 years ago. She has never used smokeless tobacco. She reports that she does not drink alcohol and does not use drugs.   Family History:  Her family history includes Cancer in her mother; Hypertension in her father and mother.   Allergies Allergies  Allergen Reactions  . Lactose Other (See Comments)    Other Reaction: stomach problems Other Reaction: stomach problems  . Aspirin Other (See Comments)     stomach problems and cramps  . Codeine Other (See Comments)    dizziness  . Contrast Media [Iodinated Diagnostic Agents] Other (See Comments)    Reddened skin - face and chest. Felt hot, burning sensation  . Gadolinium Derivatives Other (See Comments)    Pt c/o nasal stuffiness and flushed. Pt face and upper chest were redden, no hives.     Home Medications  Prior to Admission medications   Medication Sig Start Date End Date Taking? Authorizing Provider  amitriptyline (ELAVIL) 25 MG tablet Take 25 mg by mouth as needed.  01/26/10   [provider]  Buprenorphine (BUTRANS) 15 MCG/HR PTWK Place 15 mcg onto the skin once a week.     [provider]  glucose blood (ONE TOUCH ULTRA TEST) test strip USE TO TEST BLOOD SUGAR 8 TIMES DAILY. DX: E10.42 04/17/15   Philemon Kingdom, MD  HUMALOG 100 UNIT/ML injection INJECT 1ML (100 UNITS) INTO THE SKIN DAILY. FOR USE IN PUMP 10/25/15   Philemon Kingdom, MD  hydrochlorothiazide (HYDRODIURIL) 25 MG tablet Take 25 mg by mouth daily.  08/13/13 06/14/17  [provider]  insulin aspart (NOVOLOG) 100 UNIT/ML injection Use 100 units daily via insulin pump. Patient not taking: Reported on 06/14/2016 05/19/15   Philemon Kingdom, MD  levothyroxine (SYNTHROID, LEVOTHROID) 25 MCG tablet Take 25 mcg by mouth daily before breakfast.  10/15/11   [provider]  metoprolol succinate (TOPROL-XL) 50 MG 24 hr tablet Take 150 mg by mouth daily with breakfast. 1 tablet with dinner. 10/15/11   [provider]  morphine (MSIR) 15 MG tablet Take 15 mg by mouth every 6 (six) hours as needed for moderate pain or severe pain.  06/04/13   [provider]  oxycodone-acetaminophen (LYNOX) 10-300 MG tablet Take 1 tablet by mouth 3 (three) times daily.    [provider]  ramipril (ALTACE) 10 MG capsule Take 10 mg by mouth daily with breakfast.  02/14/11   [provider]  warfarin (COUMADIN) 1 MG tablet Take 1 mg by mouth  daily.    [provider]  warfarin (COUMADIN) 6 MG tablet Take 6 mg by mouth daily with breakfast.  09/21/13 06/14/17  [provider]     Critical care time: 35 minutes    Darel Hong, AGACNP-BC  Pulmonary & [place] epic messenger for cross cover needs If after hours, please call E-link

## 2020-05-05 NOTE — Progress Notes (Signed)
Pharmacy Antibiotic Note  Colleen Payne is a 70 y.o. female with PMH for T1DM, afib, mitral valve prolapse, hypothyroidism, and HTN who was admitted on 05/04/2020 with aspiration pneumonia. Patient received one dose of ceftriaxone and was also started on azithromycin. Pharmacy has been consulted for Unasyn dosing. Lactic acid 3.1, WBC 16.2, 24hTmax 97.40F; Scr 1.70.  4/14 CXR: atelectasis versus consolidation LEFT lower lobe  Plan: Continue Unasyn 3g IV q6h for 5 days.  Monitor renal function and adjust dose as clinically indicated  Height: 5\' 5"  (165.1 cm) Weight: 46.4 kg (102 lb 4.7 oz) IBW/kg (Calculated) : 57  Temp (24hrs), Avg:97.7 F (36.5 C), Min:97.2 F (36.2 C), Max:98.4 F (36.9 C)  Recent Labs  Lab 05/04/20 0932 05/04/20 1059 05/04/20 1204 05/04/20 1458 05/04/20 1906 05/04/20 2247 05/05/20 0308  WBC 16.2*  --   --   --   --   --  18.4*  CREATININE 2.17*  --  1.70*  --  1.06* 1.03* 0.78  LATICACIDVEN  --  3.1*  --  3.0*  --   --   --     Estimated Creatinine Clearance: 47.9 mL/min (by C-G formula based on SCr of 0.78 mg/dL).    Allergies  Allergen Reactions  . Lactose Other (See Comments)    Other Reaction: stomach problems Other Reaction: stomach problems  . Aspirin Other (See Comments)    stomach problems and cramps  . Codeine Other (See Comments)    dizziness  . Contrast Media [Iodinated Diagnostic Agents] Other (See Comments)    Reddened skin - face and chest. Felt hot, burning sensation  . Gadolinium Derivatives Other (See Comments)    Pt c/o nasal stuffiness and flushed. Pt face and upper chest were redden, no hives.    Antimicrobials this admission: 4/14 Ceftriaxone x1 4/14 Azithromycin >> 4/15 4/14 Unasyn >> 4/18  Dose adjustments this admission: 4/15 Unasyn 3g q12 to q6h  Microbiology results: 4/14 BCx: NG <12h 4/14 Sputum: pending  Thank you for allowing pharmacy to be a part of this patient's care.  Sherilyn Banker,  PharmD Pharmacy Resident  05/05/2020 6:31 AM

## 2020-05-05 NOTE — Progress Notes (Signed)
PHARMACY CONSULT NOTE - FOLLOW UP  Pharmacy Consult for Electrolyte Monitoring and Replacement   Recent Labs: Potassium (mmol/L)  Date Value  05/05/2020 3.3 (L)   Magnesium (mg/dL)  Date Value  05/05/2020 1.7   Calcium (mg/dL)  Date Value  05/05/2020 9.2   Albumin (g/dL)  Date Value  05/04/2020 4.6   Phosphorus (mg/dL)  Date Value  05/05/2020 1.4 (L)   Sodium (mmol/L)  Date Value  05/05/2020 143     Assessment: 70yo female with PMH for T1DM, afib, mitral valve prolapse, hypothyroidism, HTN who was admitted with severe DKA. Pharmacy has been consulted for electrolyte monitoring and replacement.   Pt transitioned off of insulin drip 4/15 LR @125ml /hr  Goal of Therapy:  K 4.0 - 5.1 mmol/L Mg 2.0 - 2.4 mmol/L All other electrolytes WNL   Plan:   K 3.3 - has completed 48mEq IV KCl  Phos 1.4 - ordered Kphos 500mg  x2   Recheck electrolytes with AM labs and replace as needed   Sherilyn Banker, PharmD Pharmacy Resident  05/05/2020 6:30 AM

## 2020-05-05 NOTE — Consult Note (Signed)
PHARMACY NOTE:  ANTIMICROBIAL RENAL DOSAGE ADJUSTMENT  Current antimicrobial regimen includes a mismatch between antimicrobial dosage and estimated renal function.  As per policy approved by the Pharmacy & Therapeutics and Medical Executive Committees, the antimicrobial dosage will be adjusted accordingly.  Current antimicrobial dosage:  Unasyn 3 g q12H  Indication: PNA  Renal Function:  Estimated Creatinine Clearance: 37.2 mL/min (A) (by C-G formula based on SCr of 1.03 mg/dL (H)). []      On intermittent HD, scheduled: []      On CRRT    Antimicrobial dosage has been changed to:  Unasyn 3 g q6H.   Additional comments:   Thank you for allowing pharmacy to be a part of this patient's care.  Oswald Hillock, [facility] 05/05/2020 1:11 AM

## 2020-05-05 NOTE — Progress Notes (Signed)
Patient is alert and oriented x 4. Patient became agitated stating that she wants to eat and wants something to drink. I explained to her that I would need to get an order due to her not being able to have anything by mouth. I also explained that I am not sure how she normally eats and drinks so I want to make sure she is safe first. Patient became more upset and stated that she wants to go home, threw the covers off of her and said that she needs to get out of bed. I explained to her that she cannot get out of bed at this time due to the state she was just in. Patient stated "I don't have to listen to you, you see me? You see you?" to which I responded that I want her to be safe. Will continue to monitor patient.

## 2020-05-05 NOTE — Progress Notes (Signed)
[facility] Progress Note Patient Name: LURLINE CAVER DOB: 12/04/1950 MRN: [id number]   Date of Service  05/05/2020  HPI/Events of Note  Patient's anion gap is 7, INR is 5.4, she was on Coumadin for atrial fibrillation. There is no overt bleeding.  eICU Interventions  Hyperglycemia transition orders entered. Coumadin is on hold. INR will be reversed if active bleeding occurs.        Kerry Kass Thresea Doble 05/05/2020, 5:14 AM

## 2020-05-05 NOTE — Progress Notes (Addendum)
Inpatient Diabetes Program Recommendations  AACE/ADA: New Consensus Statement on Inpatient Glycemic Control (2015)  Target Ranges:  Prepandial:   less than 140 mg/dL      Peak postprandial:   less than 180 mg/dL (1-2 hours)      Critically ill patients:  140 - 180 mg/dL   Lab Results  Component Value Date   GLUCAP 230 (H) 05/05/2020   HGBA1C 8.5 12/30/2014   Diabetes history: DM 1 Outpatient Diabetes medications:  Insulin pump: She is currently on a Medtronic 630G pump:  Basal rates  Midnight = 0.1  4 AM = 0.4 6 AM = 0.5 1 PM = 0.95 6 PM = 0.725 10 PM = 0.5 TDD basal: 13.35 units  Bolus settings I/C: 16 ISF: 60 Target Glucose: MN = 130, 6 AM = 120-130, and 10 PM = 130  Active insulin time: 4 hours  Current orders for Inpatient glycemic control:  Levemir 5 units bid, Novolog very sensitive q 4 hours  Inpatient Diabetes Program Recommendations:    Spoke at length to patient and her daughter.  Daughter states that they just saw Dr. Honor Junes endocrinologist on 05/03/20. She states that patients insulin pump was dislodged in the middle of the night which likely precipitated DKA.  Patient lost her husband in January and daughter states that they have noticed that she is not taking care of her self as well possibly due to depression or anxiety.  She has had some confusion as well.  She states that at the last endocrinology visit, MD showed them that patient is not bolussing as she should.  The current plan is for her to discharge home with her son and for them to take turns caring for her.  Both son and daughter are trained on use of insulin pump and patient also has glucose sensor.  Due to intermittent confusion, may want to wait until 4/16 to replace insulin pump so that daughter/son can be present to assist her.  The prefer insulin pump instead of MDI.  Recommend transition to insulin pump tomorrow morning around 8 am- Please d/c Levemir at [facility] after pump replaced.  Also instructed  daughter to bring CGM to place on patient so that they can check blood sugars frequently.  Daughter and patient are comfortable with this plan.   Thanks Adah Perl, RN, BC-ADM Inpatient Diabetes Coordinator Pager [phone] (8a-5p)

## 2020-05-06 DIAGNOSIS — G9341 Metabolic encephalopathy: Secondary | ICD-10-CM | POA: Diagnosis not present

## 2020-05-06 DIAGNOSIS — E876 Hypokalemia: Secondary | ICD-10-CM

## 2020-05-06 DIAGNOSIS — I4891 Unspecified atrial fibrillation: Secondary | ICD-10-CM

## 2020-05-06 DIAGNOSIS — E1010 Type 1 diabetes mellitus with ketoacidosis without coma: Secondary | ICD-10-CM | POA: Diagnosis not present

## 2020-05-06 LAB — BLOOD GAS, VENOUS
Acid-Base Excess: 14.3 mmol/L — ABNORMAL HIGH (ref 0.0–2.0)
Bicarbonate: 37.5 mmol/L — ABNORMAL HIGH (ref 20.0–28.0)
O2 Saturation: 97.4 %
Patient temperature: 37
pCO2, Ven: 40 mmHg — ABNORMAL LOW (ref 44.0–60.0)
pH, Ven: 7.58 — ABNORMAL HIGH (ref 7.250–7.430)
pO2, Ven: 81 mmHg — ABNORMAL HIGH (ref 32.0–45.0)

## 2020-05-06 LAB — BASIC METABOLIC PANEL
Anion gap: 6 (ref 5–15)
BUN: 13 mg/dL (ref 8–23)
CO2: 34 mmol/L — ABNORMAL HIGH (ref 22–32)
Calcium: 8.6 mg/dL — ABNORMAL LOW (ref 8.9–10.3)
Chloride: 99 mmol/L (ref 98–111)
Creatinine, Ser: 0.48 mg/dL (ref 0.44–1.00)
GFR, Estimated: >60 mL/min (ref >60)
Glucose, Bld: 172 mg/dL — ABNORMAL HIGH (ref 70–99)
Potassium: 2.3 mmol/L — CL (ref 3.5–5.1)
Sodium: 139 mmol/L (ref 135–145)

## 2020-05-06 LAB — GLUCOSE, CAPILLARY
Glucose-Capillary: 115 mg/dL — ABNORMAL HIGH (ref 70–99)
Glucose-Capillary: 119 mg/dL — ABNORMAL HIGH (ref 70–99)
Glucose-Capillary: 122 mg/dL — ABNORMAL HIGH (ref 70–99)
Glucose-Capillary: 140 mg/dL — ABNORMAL HIGH (ref 70–99)
Glucose-Capillary: 141 mg/dL — ABNORMAL HIGH (ref 70–99)
Glucose-Capillary: 165 mg/dL — ABNORMAL HIGH (ref 70–99)
Glucose-Capillary: 220 mg/dL — ABNORMAL HIGH (ref 70–99)
Glucose-Capillary: 225 mg/dL — ABNORMAL HIGH (ref 70–99)
Glucose-Capillary: 247 mg/dL — ABNORMAL HIGH (ref 70–99)
Glucose-Capillary: 427 mg/dL — ABNORMAL HIGH (ref 70–99)
Glucose-Capillary: 58 mg/dL — ABNORMAL LOW (ref 70–99)
Glucose-Capillary: 65 mg/dL — ABNORMAL LOW (ref 70–99)
Glucose-Capillary: 75 mg/dL (ref 70–99)
Glucose-Capillary: 95 mg/dL (ref 70–99)
Glucose-Capillary: 98 mg/dL (ref 70–99)

## 2020-05-06 LAB — CBC
HCT: 28.7 % — ABNORMAL LOW (ref 36.0–46.0)
Hemoglobin: 10.1 g/dL — ABNORMAL LOW (ref 12.0–15.0)
MCH: 34.5 pg — ABNORMAL HIGH (ref 26.0–34.0)
MCHC: 35.2 g/dL (ref 30.0–36.0)
MCV: 98 fL (ref 80.0–100.0)
Platelets: 150 10*3/uL (ref 150–400)
RBC: 2.93 MIL/uL — ABNORMAL LOW (ref 3.87–5.11)
RDW: 13.7 % (ref 11.5–15.5)
WBC: 8.2 10*3/uL (ref 4.0–10.5)
nRBC: 0 % (ref 0.0–0.2)

## 2020-05-06 LAB — PROTIME-INR
INR: 3.5 — ABNORMAL HIGH (ref 0.8–1.2)
Prothrombin Time: 35.3 seconds — ABNORMAL HIGH (ref 11.4–15.2)

## 2020-05-06 LAB — POTASSIUM: Potassium: 3.3 mmol/L — ABNORMAL LOW (ref 3.5–5.1)

## 2020-05-06 LAB — PHOSPHORUS: Phosphorus: 1.7 mg/dL — ABNORMAL LOW (ref 2.5–4.6)

## 2020-05-06 LAB — PROCALCITONIN: Procalcitonin: 0.23 ng/mL

## 2020-05-06 LAB — MAGNESIUM: Magnesium: 1.5 mg/dL — ABNORMAL LOW (ref 1.7–2.4)

## 2020-05-06 MED ORDER — POTASSIUM CHLORIDE 20 MEQ PO PACK
40.0000 meq | PACK | Freq: Once | ORAL | Status: DC
  Filled 2020-05-06: qty 2

## 2020-05-06 MED ORDER — MAGNESIUM SULFATE 4 GM/100ML IV SOLN
4.0000 g | Freq: Once | INTRAVENOUS | Status: AC
  Administered 2020-05-06: 4 g via INTRAVENOUS
  Filled 2020-05-06: qty 100

## 2020-05-06 MED ORDER — POTASSIUM CHLORIDE 10 MEQ/100ML IV SOLN
10.0000 meq | INTRAVENOUS | Status: AC
  Administered 2020-05-06 (×4): 10 meq via INTRAVENOUS
  Filled 2020-05-06 (×3): qty 100

## 2020-05-06 MED ORDER — POTASSIUM CHLORIDE CRYS ER 20 MEQ PO TBCR: 40.0000 meq | EXTENDED_RELEASE_TABLET | Freq: Once | ORAL | Status: DC

## 2020-05-06 MED ORDER — POTASSIUM CHLORIDE 20 MEQ PO PACK
20.0000 meq | PACK | ORAL | Status: AC
Start: 2020-05-06 — End: 2020-05-06
  Administered 2020-05-06 (×2): 20 meq via ORAL
  Filled 2020-05-06 (×2): qty 1

## 2020-05-06 MED ORDER — INSULIN DETEMIR 100 UNIT/ML ~~LOC~~ SOLN
2.0000 [IU] | Freq: Two times a day (BID) | SUBCUTANEOUS | Status: DC
  Filled 2020-05-06: qty 0.02

## 2020-05-06 MED ORDER — INSULIN ASPART 100 UNIT/ML ~~LOC~~ SOLN
3.0000 [IU] | Freq: Three times a day (TID) | SUBCUTANEOUS | Status: DC
  Administered 2020-05-06 – 2020-05-07 (×3): 3 [IU] via SUBCUTANEOUS
  Filled 2020-05-06 (×2): qty 1

## 2020-05-06 MED ORDER — POTASSIUM CHLORIDE CRYS ER 10 MEQ PO TBCR
20.0000 meq | EXTENDED_RELEASE_TABLET | ORAL | Status: DC
Start: 2020-05-06 — End: 2020-05-06

## 2020-05-06 MED ORDER — POTASSIUM CHLORIDE CRYS ER 20 MEQ PO TBCR
40.0000 meq | EXTENDED_RELEASE_TABLET | Freq: Once | ORAL | Status: AC
  Administered 2020-05-06: 40 meq via ORAL
  Filled 2020-05-06: qty 2

## 2020-05-06 MED ORDER — INSULIN ASPART 100 UNIT/ML ~~LOC~~ SOLN: 0.0000 [IU] | Freq: Every day | SUBCUTANEOUS | Status: DC

## 2020-05-06 MED ORDER — INSULIN ASPART 100 UNIT/ML ~~LOC~~ SOLN
0.0000 [IU] | Freq: Three times a day (TID) | SUBCUTANEOUS | Status: DC
  Administered 2020-05-06: 2 [IU] via SUBCUTANEOUS
  Administered 2020-05-07: 5 [IU] via SUBCUTANEOUS
  Administered 2020-05-07: 2 [IU] via SUBCUTANEOUS
  Filled 2020-05-06 (×3): qty 1

## 2020-05-06 MED ORDER — POTASSIUM CHLORIDE 10 MEQ/100ML IV SOLN
10.0000 meq | INTRAVENOUS | Status: DC
  Filled 2020-05-06: qty 100

## 2020-05-06 MED ORDER — INSULIN DETEMIR 100 UNIT/ML ~~LOC~~ SOLN: 5.0000 [IU] | Freq: Two times a day (BID) | SUBCUTANEOUS | Status: DC

## 2020-05-06 MED ORDER — DEXTROSE IN LACTATED RINGERS 5 % IV SOLN: INTRAVENOUS | Status: DC

## 2020-05-06 MED ORDER — SODIUM CHLORIDE 0.9 % IV SOLN: INTRAVENOUS | Status: DC

## 2020-05-06 MED ORDER — POTASSIUM PHOSPHATES 15 MMOLE/5ML IV SOLN
30.0000 mmol | Freq: Once | INTRAVENOUS | Status: AC
  Administered 2020-05-06: 30 mmol via INTRAVENOUS
  Filled 2020-05-06: qty 10

## 2020-05-06 MED ORDER — POTASSIUM CHLORIDE CRYS ER 10 MEQ PO TBCR: 20.0000 meq | EXTENDED_RELEASE_TABLET | ORAL | Status: DC

## 2020-05-06 MED ORDER — INSULIN DETEMIR 100 UNIT/ML ~~LOC~~ SOLN
5.0000 [IU] | Freq: Two times a day (BID) | SUBCUTANEOUS | Status: DC
  Administered 2020-05-06 – 2020-05-07 (×3): 5 [IU] via SUBCUTANEOUS
  Filled 2020-05-06 (×5): qty 0.05

## 2020-05-06 NOTE — Progress Notes (Addendum)
Inpatient Diabetes Program Recommendations  AACE/ADA: New Consensus Statement on Inpatient Glycemic Control   Target Ranges:  Prepandial:   less than 140 mg/dL      Peak postprandial:   less than 180 mg/dL (1-2 hours)      Critically ill patients:  140 - 180 mg/dL  Results for Colleen, Payne (MRN [id number]) as of 05/06/2020 13:17  Ref. Range 05/06/2020 07:34 05/06/2020 11:21  Glucose-Capillary Latest Ref Range: 70 - 99 mg/dL 140 (H) 427 (H)   Results for Colleen, Payne (MRN [id number]) as of 05/06/2020 07:39  Ref. Range 05/05/2020 08:33 05/05/2020 11:18 05/05/2020 15:29 05/05/2020 21:00 05/05/2020 23:54 05/06/2020 00:19 05/06/2020 00:31 05/06/2020 01:27 05/06/2020 02:01 05/06/2020 03:01 05/06/2020 03:46 05/06/2020 04:42 05/06/2020 06:17 05/06/2020 07:34  Glucose-Capillary Latest Ref Range: 70 - 99 mg/dL 115 (H)    Levemir 5 units @8 :00 230 (H)  Novolog 2 units 126 (H) 218 (H)  Novolog 2 units  Levemir 5 units 75 58 (L) 225 (H) 119 (H) 95 65 (L) 141 (H) 115 (H) 122 (H) 140 (H)   Review of Glycemic Control  Diabetes history: DM1 (makes NO insulin; requires basal, correction, and carb coverage insulin) Outpatient Diabetes medications: Medtronic insulin pump (total basal 13.35 units, I:CR 1:16 grams, ISF 1:60 mg/dl) Current orders for Inpatient glycemic control: Levemir 5 units Q12H, Novolog 0-6 units Q4H  Inpatient Diabetes Program Recommendations:    Insulin: If patient will resume her insulin pump today, please discontinue Levemir and Novolog correction. If SQ insulin will be continued, please decrease Levemir to 4 units Q12H, change CBGs to AC&HS and Novolog 0-6 units to TID with meals and Novolog 0-5 units QHS.   NOTE: Inpatient diabetes coordinator spoke with patient and daughter yesterday (see note by J. Moshe Cipro, RN, BC-ADM on 05/05/20 for details) and they would prefer patient transition back to her insulin pump for outpatient DM control. Noted patient experienced hypoglycemia during the  night and glucose 122 mg/dl this morning. If patient will be transitioned back to her insulin pump today, will need to discontinue Levemir and Novolog correction scale and use Insulin Pump order set to order CBGs and Insulin Pump AC&HS and 2am.  Sent communication to K. Perea,RN and Dr. Kurtis Bushman and Dr. Kurtis Bushman will decide on plan for DM control once patient is seen in rounds.   Addendum 05/06/20@13 :00-Called patient's room and spoke with patient's daughter [name] (per her request). She expressed concern regarding patient's glucose being low during the night and being treated with D50 versus juice or glucose tablets (as used at home). Explained that per chart, patient was too lethargic during the night to take anything by mouth. Therefore, D50 was used to treat hypoglycemia. She is now concerned that glucose is up to 427 mg/dl at 11:21. Explained that this morning patient's glucose was 140 mg/dl and she was not given any insulin until MD rounded to determine if the insulin pump would be resumed or not. Informed [name] that Levemir 5 units was given at 12:03 along with Novolog 6 units at 12:02.  Discussed current orders of Levemir 5 units BID and Novolog 0-6 units Q4H. Informed [name] that I would reach out to Dr. Kurtis Bushman to see if patient will stay over night. If so, will request that Novolog correction be changed to 0-6 units TID with meals, Novolog 0-5 units QHS, and be ordered Novolog 3 units TID with meals for meal coverage if patient eats at least 50% of meals. Called patient's daughter back and updated her on  the changes made to insulin regimen. [name] has asked that any hypoglycemia be treated with PO intake (juice or glucose tablets if possible).  [name] states that she would like to see if patient qualifies for home health. I informed patient that PT made note today that PT was not completed today due to critical potassium lab. Also discussed that TOC would work with patient and family if home health is recommended. [name]  asked that she be called at [phone] regarding PT recommendations and home health needs.  Thanks, Barnie Alderman, RN, MSN, CDE Diabetes Coordinator Inpatient Diabetes Program [phone] (Team Pager from 8am to 5pm)

## 2020-05-06 NOTE — Progress Notes (Addendum)
PHARMACY CONSULT NOTE - FOLLOW UP  Pharmacy Consult for Electrolyte Monitoring and Replacement   Recent Labs: Potassium (mmol/L)  Date Value  05/06/2020 3.3 (L)   Magnesium (mg/dL)  Date Value  05/06/2020 1.5 (L)   Calcium (mg/dL)  Date Value  05/06/2020 8.6 (L)   Albumin (g/dL)  Date Value  05/04/2020 4.6   Phosphorus (mg/dL)  Date Value  05/06/2020 1.7 (L)   Sodium (mmol/L)  Date Value  05/06/2020 139     Assessment: 70yo female with PMH for T1DM, afib, mitral valve prolapse, hypothyroidism, HTN who was admitted with severe DKA. Pharmacy has been consulted for electrolyte monitoring and replacement.   Pt transitioned off of insulin drip 4/15 LR @125ml /hr  Goal of Therapy:  K 4.0 - 5.1 mmol/L Mg 2.0 - 2.4 mmol/L All other electrolytes WNL   Plan:  4/16 1449  K= 3.3. Will replace with KCL 20 meq PO packet q4h x 2 doses.  (Patient refused 2nd KCL PO dose this am complaining about swallowing so many pills per RN) F/u electrolytes in am   Chinita Greenland PharmD Clinical Pharmacist 05/06/2020

## 2020-05-06 NOTE — Progress Notes (Addendum)
PROGRESS NOTE    Colleen Payne  TWS:[id number] DOB: [birth year] DOA: 05/04/2020 PCP: Kirk Ruths, MD    Brief Narrative:  70 year old female with past medical history significant for DM Type 1 requiring insulin pump who is admitted with severe DKA, suspected community-acquired pneumonia vs. Aspiration Pneumonia, and acute metabolic encephalopathy.   Colleen Payne is a 70 year old female with a past medical history significant for type 1 diabetes mellitus with insulin pump, atrial fibrillation, mitral valve prolapse on Coumadin, hypothyroidism, hypertension who presented to [facility] ED on 05/04/2020 due to altered mental status.  Patient is currently obtunded, therefore history is obtained from patient's daughter [name] and ED and nursing notes.  Per patient's daughter, patient has had some altered mental status and hyperglycemia the last couple days.  She was seen at the endocrinology clinic yesterday for evaluation of elevated blood sugars and insulin pump.  Reportedly the patient has not been bolusing herself appropriately with insulin for meal coverage.  Overnight her insulin pump became disconnected, and she was found this morning by her son severely altered and vomiting.  ED Course:  Upon presentation to the ED she was noted to be severely drowsy, but protecting her airway.  Work-up shows glucose 1027, bicarb 11, anion gap 35, BUN 62, creatinine 2.17, chloride 89, WBC 16.2, lactic acid 3.1, venous blood gas: pH 7.12/PCO2 26,/PO2 48/bicarb 8.5.  Urinalysis is positive for ketones, but not consistent with UTI.  Chest x-ray is concerning for left lower lobe consolidation versus atelectasis.  CT head is normal.  Her COVID-19 PCR and influenza PCR both negative.  In the ED she was started on aggressive IV fluids and placed on insulin drip, as well as received empiric azithromycin and Rocephin for pneumonia coverage.  PCCM is asked to admit the patient to stepdown unit for further work-up  and treatment of severe DKA, suspected community-acquired pneumonia vs. Aspiration Pneumonia, and acute metabolic encephalopathy.  4/16-PCCM pickup today. Pt appears to be still confused, but daughter at bedside states better than before, but not at baseline. Not much po intake this am. D/w daughter holding off using her insulin pump as she is not at her baseline MS.     Consultants:   Diabetic educator, PCCM  Procedures: ct head-neg. For acute abn  Antimicrobials:   unasyn   Subjective: Pt quiet, answers in short sentences. Denies dizziness, sob, cp, abd pain  Objective: Vitals:   05/05/20 2341 05/06/20 0028 05/06/20 0203 05/06/20 0326  BP: 135/70 (!) 127/54 (!) 129/59 (!) 125/51  Pulse: 71 60 (!) 56 (!) 59  Resp: 18 16 18 16   Temp:  98.2 F (36.8 C) 98.1 F (36.7 C) 98 F (36.7 C)  TempSrc:   Oral Oral  SpO2: 97% 96% 100% 96%  Weight:      Height:        Intake/Output Summary (Last 24 hours) at 05/06/2020 0843 Last data filed at 05/06/2020 0737 Gross per 24 hour  Intake 1254.29 ml  Output 800 ml  Net 454.29 ml   Filed Weights   05/04/20 0931 05/04/20 1400 05/05/20 0500  Weight: 46.4 kg 46.4 kg 45.6 kg    Examination:  General exam: Appears calm and comfortable  Respiratory system: Clear to auscultation. Respiratory effort normal. Cardiovascular system: S1 & S2 heard, RRR. No JVD, murmurs, rubs, gallops or clicks.  Gastrointestinal system: Abdomen is nondistended, soft and nontender. Normal bowel sounds heard. Central nervous system: awake, alert/oriented to person, knows in hospital, but does not know which,  thinks at [place], and not oriented to time Grossly intact Extremities: no edema Skin:warm, dry Psychiatry: Mood & affect appropriate in current setting.     Data Reviewed: I have personally reviewed following labs and imaging studies  CBC: Recent Labs  Lab 05/04/20 0932 05/05/20 0308 05/06/20 0322  WBC 16.2* 18.4* 8.2  HGB 14.5 11.0* 10.1*   HCT 45.2 30.3* 28.7*  MCV 106.4* 95.6 98.0  PLT 365 224 098   Basic Metabolic Panel: Recent Labs  Lab 05/04/20 1204 05/04/20 1458 05/04/20 1906 05/04/20 2247 05/05/20 0308 05/06/20 0322  NA 141  --  143 143 143 139  K 3.2*  --  3.7 3.4* 3.3* 2.3*  CL 101  --  106 105 108 99  CO2 15*  --  24 29 28  34*  GLUCOSE 593*  --  221* 148* 157* 172*  BUN 52*  --  43* 40* 36* 13  CREATININE 1.70*  --  1.06* 1.03* 0.78 0.48  CALCIUM 8.8*  --  9.4 9.0 9.2 8.6*  MG  --  2.2  --   --  1.7 1.5*  PHOS  --   --   --   --  1.4* 1.7*   GFR: Estimated Creatinine Clearance: 47.1 mL/min (by C-G formula based on SCr of 0.48 mg/dL). Liver Function Tests: Recent Labs  Lab 05/04/20 0932  AST 41  ALT 29  ALKPHOS 108  BILITOT 2.8*  PROT 7.8  ALBUMIN 4.6   No results for input(s): LIPASE, AMYLASE in the last 168 hours. No results for input(s): AMMONIA in the last 168 hours. Coagulation Profile: Recent Labs  Lab 05/04/20 1458 05/05/20 0308 05/06/20 0322  INR 4.2* 5.4* 3.5*   Cardiac Enzymes: No results for input(s): CKTOTAL, CKMB, CKMBINDEX, TROPONINI in the last 168 hours. BNP (last 3 results) No results for input(s): PROBNP in the last 8760 hours. HbA1C: Recent Labs    05/04/20 0932 05/05/20 1011  HGBA1C 11.2* 10.6*   CBG: Recent Labs  Lab 05/06/20 0301 05/06/20 0346 05/06/20 0442 05/06/20 0617 05/06/20 0734  GLUCAP 65* 141* 115* 122* 140*   Lipid Profile: No results for input(s): CHOL, HDL, LDLCALC, TRIG, CHOLHDL, LDLDIRECT in the last 72 hours. Thyroid Function Tests: Recent Labs    05/05/20 0308  TSH 0.359   Anemia Panel: No results for input(s): VITAMINB12, FOLATE, FERRITIN, TIBC, IRON, RETICCTPCT in the last 72 hours. Sepsis Labs: Recent Labs  Lab 05/04/20 1059 05/04/20 1458 05/04/20 2247 05/05/20 0308  PROCALCITON  --   --  1.27 1.14  LATICACIDVEN 3.1* 3.0*  --   --     Recent Results (from the past 240 hour(s))  Resp Panel by RT-PCR (Flu A&B,  Covid) Nasopharyngeal Swab     Status: None   Collection Time: 05/04/20  9:54 AM   Specimen: Nasopharyngeal Swab; Nasopharyngeal(NP) swabs in vial transport medium  Result Value Ref Range Status   SARS Coronavirus 2 by RT PCR NEGATIVE NEGATIVE Final    Comment: (NOTE) SARS-CoV-2 target nucleic acids are NOT DETECTED.  The SARS-CoV-2 RNA is generally detectable in upper respiratory specimens during the acute phase of infection. The lowest concentration of SARS-CoV-2 viral copies this assay can detect is 138 copies/mL. A negative result does not preclude SARS-Cov-2 infection and should not be used as the sole basis for treatment or other patient management decisions. A negative result may occur with  improper specimen collection/handling, submission of specimen other than nasopharyngeal swab, presence of viral mutation(s) within the areas targeted  by this assay, and inadequate number of viral copies(<138 copies/mL). A negative result must be combined with clinical observations, patient history, and epidemiological information. The expected result is Negative.  Fact Sheet for Patients:  EntrepreneurPulse.com.au  Fact Sheet for Healthcare Providers:  IncredibleEmployment.be  This test is no t yet approved or cleared by the [place] FDA and  has been authorized for detection and/or diagnosis of SARS-CoV-2 by FDA under an Emergency Use Authorization (EUA). This EUA will remain  in effect (meaning this test can be used) for the duration of the COVID-19 declaration under Section 564(b)(1) of the Act, 21 U.S.C.section 360bbb-3(b)(1), unless the authorization is terminated  or revoked sooner.       Influenza A by PCR NEGATIVE NEGATIVE Final   Influenza B by PCR NEGATIVE NEGATIVE Final    Comment: (NOTE) The Xpert Xpress SARS-CoV-2/FLU/RSV plus assay is intended as an aid in the diagnosis of influenza from Nasopharyngeal swab specimens and should  not be used as a sole basis for treatment. Nasal washings and aspirates are unacceptable for Xpert Xpress SARS-CoV-2/FLU/RSV testing.  Fact Sheet for Patients: EntrepreneurPulse.com.au  Fact Sheet for Healthcare Providers: IncredibleEmployment.be  This test is not yet approved or cleared by the [place] FDA and has been authorized for detection and/or diagnosis of SARS-CoV-2 by FDA under an Emergency Use Authorization (EUA). This EUA will remain in effect (meaning this test can be used) for the duration of the COVID-19 declaration under Section 564(b)(1) of the Act, 21 U.S.C. section 360bbb-3(b)(1), unless the authorization is terminated or revoked.  Performed at [facility], [address] [place], [place] [address]   Blood culture (routine x 2)     Status: None (Preliminary result)   Collection Time: 05/04/20 10:59 AM   Specimen: BLOOD  Result Value Ref Range Status   Specimen Description BLOOD RIGHT ANTECUBITAL  Final   Special Requests   Final    BOTTLES DRAWN AEROBIC AND ANAEROBIC Blood Culture adequate volume   Culture   Final    NO GROWTH 2 DAYS Performed at [facility], [address] [place], [place] [address]    Report Status PENDING  Incomplete  Culture, blood (Routine X 2) w Reflex to ID Panel     Status: None (Preliminary result)   Collection Time: 05/04/20 10:47 PM   Specimen: BLOOD  Result Value Ref Range Status   Specimen Description BLOOD BLOOD LEFT HAND  Final   Special Requests   Final    BOTTLES DRAWN AEROBIC AND ANAEROBIC Blood Culture adequate volume   Culture   Final    NO GROWTH 2 DAYS Performed at [facility], [address] [place], [place] [address]    Report Status PENDING  Incomplete         Radiology Studies: CT Head Wo Contrast  Result Date: 05/04/2020 CLINICAL DATA:  70 year old female with altered mental status. Lethargy. Hyperglycemia. EXAM: CT HEAD  WITHOUT CONTRAST TECHNIQUE: Contiguous axial images were obtained from the base of the skull through the vertex without intravenous contrast. COMPARISON:  Head CT 07/30/2007.  Brain MRI 08/11/2009. FINDINGS: Brain: Cerebral volume loss since 2009 appears generalized. No midline shift, ventriculomegaly, mass effect, evidence of mass lesion, intracranial hemorrhage or evidence of cortically based acute infarction. Gray-white matter differentiation remains within normal limits for age. No cortical encephalomalacia identified. Vascular: Calcified atherosclerosis at the skull base. No suspicious intracranial vascular hyperdensity. Skull: No acute osseous abnormality identified. Sinuses/Orbits: Visualized paranasal sinuses and mastoids are clear.  Other: Postoperative changes to both globes from the prior CT. No acute orbit or scalp soft tissue finding. IMPRESSION: Normal for age noncontrast CT appearance of the brain. Electronically Signed   By: Genevie Ann M.D.   On: 05/04/2020 10:24   DG Chest Portable 1 View  Result Date: 05/04/2020 CLINICAL DATA:  Hyperglycemia, lethargy, history atrial fibrillation, type I diabetes mellitus, hypertension, past history diabetic ketoacidosis EXAM: PORTABLE CHEST 1 VIEW COMPARISON:  Portable exam 0947 hours compared to 06/04/2018 FINDINGS: Normal heart size, mediastinal contours, and pulmonary vascularity. Atelectasis versus consolidation medial LEFT lower lobe. Remaining lungs clear. No pleural effusion or pneumothorax. Bones demineralized. IMPRESSION: Atelectasis versus consolidation LEFT lower lobe. Electronically Signed   By: Lavonia Dana M.D.   On: 05/04/2020 10:14        Scheduled Meds: . cyclobenzaprine  5 mg Oral BID  . DULoxetine  40 mg Oral Daily  . gabapentin  300 mg Oral QHS  . insulin aspart  0-6 Units Subcutaneous Q4H  . insulin detemir  5 Units Subcutaneous Q12H  . levothyroxine  25 mcg Oral QAC breakfast  . metoprolol succinate  50 mg Oral Daily  .  multivitamin with minerals  1 tablet Oral Daily  . potassium chloride  40 mEq Oral Once  . ramipril  10 mg Oral Q breakfast   Continuous Infusions: . ampicillin-sulbactam (UNASYN) IV Stopped (05/06/20 0531)  . dextrose 5% lactated ringers 50 mL/hr at 05/06/20 0640  . potassium chloride 10 mEq (05/06/20 0657)  . potassium PHOSPHATE IVPB (in mmol) 30 mmol (05/06/20 3785)    Assessment & Plan:   Active Problems:   DKA (diabetic ketoacidosis) (HCC)   Severe DKA>>in setting of insulin pump issues.  Resolved PMHx of DM Type 1 with insulin pump Was on DKA protocal with insulin drip Pt MS not at baseline, will continue holding off on using her insulin pump Was going to decrease levemir 5 to 2 bid since BG levels were lower, but had breakfast now in 400's. Monitor closely Will change d5 to NS   Suspected  Aspiration Pneumonia (given background circumstance of pt being Altered and found with vomit,  Afebrile, leukocytosis improved Procalcitonin decreasing we will monitor Follow-up cultures (strep pneumo is negative)   AKI>>resolved Anion Gap Metabolic Acidosis due to DKA and Lactic acidosis>>resolved Hypokalemia -Monitor I&O's / urinary output -Follow BMP -Ensure adequate renal perfusion -Avoid nephrotoxic agents as able -Replace electrolytes as indicated -IV fluids, can d/c once able to consistently tolerate PO intake -Pharmacy consulted for assistance with Electrolyte replacement  Hypokalemia-K is 2.3 Will supplement with p.o. and IV Monitor levels   PMHx of atrial fibrillation and mitral valve prolapse on Coumadin, Hypertension Supratherapeutic INR -Continuous cardiac monitoring INR 5.4>>>3.5 Continue to hold coumadin No signs and symptoms of bleeding Given patient has no signs of bleeding and INR decreasing we will hold off on Coumadin reversal and let his glabrata on its own, will reverse if active bleeding occurs Continue metoprolol and ramipril    Acute  metabolic encephalopathy, suspect due to severe DKA>>improving, not at baseline yet PMHx of Depression and Anxiety -Provide supportive care -Avoid sedating meds as able -CT head normal 05/04/2020 -Check urine drug screen ~positive for opiates Continue home dose duloxetine     Hypothyroidism -Continue home Synthroid -Check TSH~ 0.359       DVT prophylaxis: inr elevated, on coumadin Code Status:full Family Communication: daughter at bedside. When I went to talk to daughter to get a better understanding of pt's  baseline MS, pt's daughter was aggressive, unhappy why pt has not received insulin this am, tried explaining to her I had to assess pt to see if ok to resume her pump but I saw pt MS still confused and was going to continue with lantus and also wanted to talk to her (daughter) to have a better understanding of what mother's intake, MS , and baseline was.. By then pt's daughter told me "she did not like how they just brought her mother out of the ICU and have "a new" hospitalist take over her care everytime. I did tell daughter if this is concern, she can discuss with administration about the transition from icu to floor as I have no control over this.   Status is: Inpatient  Remains inpatient appropriate because:Inpatient level of care appropriate due to severity of illness   Dispo: The patient is from: Home              Anticipated d/c is to: TBD              Patient currently is not medically stable to d/c.   Difficult to place patient No            LOS: 2 days   Time spent: 45 min with >50% on coc    Nolberto Hanlon, MD [facility] Pager [phone]  If 7PM-7AM, please contact night-coverage 05/06/2020, 8:43 AM

## 2020-05-06 NOTE — Progress Notes (Signed)
Pt blood sugar at 75, rechecked 58. Pt hard to arouse. D50 amp (46ml) given.

## 2020-05-06 NOTE — Progress Notes (Signed)
PT Cancellation Note  Patient Details Name: Colleen Payne MRN: [id number] DOB: 11-Dec-1950   Cancelled Treatment:    Reason Eval/Treat Not Completed: Patient not medically ready.  PT consult received.  Chart reviewed.  Pt noted with critical potassium lab result this morning with potassium 2.3.  Per PT guidelines for low potassium, exertional activity currently not recommended.  Will hold PT at this time and re-attempt PT evaluation at a later date/time as medically appropriate.  Leitha Bleak, PT 05/06/20, 11:12 AM

## 2020-05-06 NOTE — Progress Notes (Addendum)
Alerted by care RN to patient's hypoglycemia overnight.  1 amp of D 50 given per protocol. Patient's CBG: 58 > 225 > 119 > 95 Patient reportedly too lethargic for PO intake such as juice/ snack.  Upon bedside assessment patient lethargic but A&O x 2 (consistent with daytime NP assessment), able to follow simple commands/ equal strength/ PERRL +3. - D 5 LR started at 30 mL/h - check CBG Q 1 hour x 2, adjust to maintain CBG 100-160   Addendum: Patient's VBG: 7.58/ 40/ 81/ 37.5. Chemistry revealing severe hypokalemic at 2.3, hypomagnesemia at 1.5 & hypophosphatemia at 1.7. Serum CO2 elevated at 34. Patient more alert per nursing able to take PO meds. Metabolic Alkalosis due to severe hypokalemia - Potassium PO followed by IV replacement with a timed recheck at 1500 - Magnesium replacement - Phosphorus replacement   Domingo Pulse [place], AGACNP-BC Acute Care Nurse Practitioner [place]   [phone] / [phone] Please see Amion for pager details.

## 2020-05-06 NOTE — Progress Notes (Addendum)
Pt now alert and orientedx2, pleasantly confused and had episodes of visual hallucinations.  V/S stable except bradycardic -50s-60s.  Potassium, Mag and phospate low; result relayed to ICU NP. K, mag and phos replaced per order.  Had runs of SVT HR-167. ICU NP informed. Pt denies any pain or SOB. Had episodes of loose stools this shift - 3x. Voiding well.

## 2020-05-06 NOTE — Progress Notes (Signed)
Blood sugar checked after d50 - 225-119-95-65, trending down. Pt lethargic, responds to pain. ICU NP informed and ordered to start on D5LR at 27ml/hr but eventually increased to 32ml/hr, D50 1/2amp given as well as per order. Blood sugar monitored.

## 2020-05-06 NOTE — Progress Notes (Signed)
PHARMACY CONSULT NOTE - FOLLOW UP  Pharmacy Consult for Electrolyte Monitoring and Replacement   Recent Labs: Potassium (mmol/L)  Date Value  05/06/2020 2.3 (LL)   Magnesium (mg/dL)  Date Value  05/06/2020 1.5 (L)   Calcium (mg/dL)  Date Value  05/06/2020 8.6 (L)   Albumin (g/dL)  Date Value  05/04/2020 4.6   Phosphorus (mg/dL)  Date Value  05/06/2020 1.7 (L)   Sodium (mmol/L)  Date Value  05/06/2020 139     Assessment: 70yo female with PMH for T1DM, afib, mitral valve prolapse, hypothyroidism, HTN who was admitted with severe DKA. Pharmacy has been consulted for electrolyte monitoring and replacement.   Pt transitioned off of insulin drip 4/15 LR @125ml /hr  Goal of Therapy:  K 4.0 - 5.1 mmol/L Mg 2.0 - 2.4 mmol/L All other electrolytes WNL   Plan:   K 2.3 at 0322- KCL 10 meq IV x 4 bags and KCL 40 meq PO x 1 ordered by NP.   KCL 40 meq po x 1 ordered by MD (clarified with MD awareness of previous KCL orders and KPhos order)  Recheck K at 1500  Phos 1.7 -Potassium phosphate 30 mmol x 1 ordered by NP  Mag 1.5 - Mag 4 gm IV x 1 ordered by NP  Recheck electrolytes with AM labs and replace as needed   Chinita Greenland PharmD Clinical Pharmacist 05/06/2020

## 2020-05-07 DIAGNOSIS — N179 Acute kidney failure, unspecified: Secondary | ICD-10-CM

## 2020-05-07 LAB — CBC
HCT: 35.3 % — ABNORMAL LOW (ref 36.0–46.0)
Hemoglobin: 12.1 g/dL (ref 12.0–15.0)
MCH: 33.5 pg (ref 26.0–34.0)
MCHC: 34.3 g/dL (ref 30.0–36.0)
MCV: 97.8 fL (ref 80.0–100.0)
Platelets: 152 10*3/uL (ref 150–400)
RBC: 3.61 MIL/uL — ABNORMAL LOW (ref 3.87–5.11)
RDW: 13.2 % (ref 11.5–15.5)
WBC: 5.8 10*3/uL (ref 4.0–10.5)
nRBC: 0 % (ref 0.0–0.2)

## 2020-05-07 LAB — PROCALCITONIN: Procalcitonin: 0.22 ng/mL

## 2020-05-07 LAB — POTASSIUM: Potassium: 5.4 mmol/L — ABNORMAL HIGH (ref 3.5–5.1)

## 2020-05-07 LAB — MAGNESIUM: Magnesium: 2.1 mg/dL (ref 1.7–2.4)

## 2020-05-07 LAB — BASIC METABOLIC PANEL
Anion gap: 9 (ref 5–15)
BUN: 9 mg/dL (ref 8–23)
CO2: 30 mmol/L (ref 22–32)
Calcium: 8.8 mg/dL — ABNORMAL LOW (ref 8.9–10.3)
Chloride: 100 mmol/L (ref 98–111)
Creatinine, Ser: 0.44 mg/dL (ref 0.44–1.00)
GFR, Estimated: >60 mL/min (ref >60)
Glucose, Bld: 76 mg/dL (ref 70–99)
Potassium: 3.1 mmol/L — ABNORMAL LOW (ref 3.5–5.1)
Sodium: 139 mmol/L (ref 135–145)

## 2020-05-07 LAB — PHOSPHORUS
Phosphorus: 1.6 mg/dL — ABNORMAL LOW (ref 2.5–4.6)
Phosphorus: 4.5 mg/dL (ref 2.5–4.6)

## 2020-05-07 LAB — PROTIME-INR
INR: 1.5 — ABNORMAL HIGH (ref 0.8–1.2)
Prothrombin Time: 18.1 seconds — ABNORMAL HIGH (ref 11.4–15.2)

## 2020-05-07 LAB — GLUCOSE, CAPILLARY
Glucose-Capillary: 211 mg/dL — ABNORMAL HIGH (ref 70–99)
Glucose-Capillary: 379 mg/dL — ABNORMAL HIGH (ref 70–99)
Glucose-Capillary: 269 mg/dL — ABNORMAL HIGH (ref 70–99)
Glucose-Capillary: 335 mg/dL — ABNORMAL HIGH (ref 70–99)

## 2020-05-07 MED ORDER — WARFARIN SODIUM 3 MG PO TABS
3.0000 mg | ORAL_TABLET | Freq: Every day | ORAL | Status: AC
Start: 2020-05-07 — End: ?

## 2020-05-07 MED ORDER — AMOXICILLIN-POT CLAVULANATE 875-125 MG PO TABS
1.0000 | ORAL_TABLET | Freq: Two times a day (BID) | ORAL | Status: DC
  Filled 2020-05-07: qty 1

## 2020-05-07 MED ORDER — POTASSIUM CHLORIDE 10 MEQ/100ML IV SOLN
10.0000 meq | INTRAVENOUS | Status: AC
  Administered 2020-05-07 (×4): 10 meq via INTRAVENOUS
  Filled 2020-05-07 (×3): qty 100

## 2020-05-07 MED ORDER — WARFARIN SODIUM 3 MG PO TABS
3.0000 mg | ORAL_TABLET | Freq: Once | ORAL | Status: AC
  Administered 2020-05-07: 3 mg via ORAL
  Filled 2020-05-07: qty 1

## 2020-05-07 MED ORDER — POTASSIUM CHLORIDE 20 MEQ PO PACK
40.0000 meq | PACK | Freq: Once | ORAL | Status: AC
  Administered 2020-05-07: 40 meq via ORAL
  Filled 2020-05-07: qty 2

## 2020-05-07 MED ORDER — AMOXICILLIN-POT CLAVULANATE 875-125 MG PO TABS: 1.0000 | ORAL_TABLET | Freq: Two times a day (BID) | ORAL | Status: DC

## 2020-05-07 MED ORDER — K PHOS MONO-SOD PHOS DI & MONO 155-852-130 MG PO TABS
500.0000 mg | ORAL_TABLET | Freq: Two times a day (BID) | ORAL | Status: DC
  Administered 2020-05-07: 500 mg via ORAL
  Filled 2020-05-07 (×2): qty 2

## 2020-05-07 MED ORDER — POTASSIUM PHOSPHATES 15 MMOLE/5ML IV SOLN
30.0000 mmol | Freq: Once | INTRAVENOUS | Status: DC
  Administered 2020-05-07: 30 mmol via INTRAVENOUS
  Filled 2020-05-07: qty 10

## 2020-05-07 MED ORDER — AMOXICILLIN-POT CLAVULANATE 875-125 MG PO TABS: 1.0000 | ORAL_TABLET | Freq: Two times a day (BID) | ORAL | 0 refills | Status: AC

## 2020-05-07 MED ORDER — POTASSIUM CHLORIDE CRYS ER 20 MEQ PO TBCR
40.0000 meq | EXTENDED_RELEASE_TABLET | Freq: Once | ORAL | Status: AC
  Administered 2020-05-07: 40 meq via ORAL
  Filled 2020-05-07: qty 2

## 2020-05-07 NOTE — Progress Notes (Signed)
Colleen Payne to be D/C'd home per MD order.  Discussed prescriptions and follow up appointments with the patient. Prescriptions given to patient, medication list explained in detail. Pt verbalized understanding.  Allergies as of 05/07/2020       Reactions   Lactose Other (See Comments)   Other Reaction: stomach problems Other Reaction: stomach problems   Aspirin Other (See Comments)   stomach problems and cramps   Codeine Other (See Comments)   dizziness   Contrast Media [iodinated Diagnostic Agents] Other (See Comments)   Reddened skin - face and chest. Felt hot, burning sensation   Gadolinium Derivatives Other (See Comments)   Pt c/o nasal stuffiness and flushed. Pt face and upper chest were redden, no hives.        Medication List     STOP taking these medications    cyclobenzaprine 5 MG tablet Commonly known as: FLEXERIL   insulin aspart 100 UNIT/ML injection Commonly known as: NovoLOG   morphine 15 MG tablet Commonly known as: MSIR   oxycodone-acetaminophen 10-300 MG tablet Commonly known as: LYNOX       TAKE these medications    albuterol 108 (90 Base) MCG/ACT inhaler Commonly known as: VENTOLIN HFA Inhale 2 puffs into the lungs every 6 (six) hours as needed.   amitriptyline 25 MG tablet Commonly known as: ELAVIL Take 25 mg by mouth as needed.   amoxicillin-clavulanate 875-125 MG tablet Commonly known as: AUGMENTIN Take 1 tablet by mouth every 12 (twelve) hours for 1 day.   buprenorphine 15 MCG/HR Commonly known as: BUTRANS Place 15 mcg onto the skin once a week.   DULoxetine HCl 40 MG Cpep Take 1 capsule by mouth daily.   gabapentin 300 MG capsule Commonly known as: NEURONTIN Take 1 capsule by mouth at bedtime.   glucose blood test strip Commonly known as: ONE TOUCH ULTRA TEST USE TO TEST BLOOD SUGAR 8 TIMES DAILY. DX: E10.42   HumaLOG 100 UNIT/ML injection Generic drug: insulin lispro INJECT 1ML (100 UNITS) INTO THE SKIN DAILY. FOR USE  IN PUMP   levothyroxine 25 MCG tablet Commonly known as: SYNTHROID Take 25 mcg by mouth daily before breakfast.   metoprolol succinate 50 MG 24 hr tablet Commonly known as: TOPROL-XL Take 50-100 mg by mouth in the morning and at bedtime. 2 tablets in the morning with breakfast and 1 tab at night with dinner   multivitamin with minerals Tabs tablet Take 1 tablet by mouth daily.   ramipril 10 MG capsule Commonly known as: ALTACE Take 10 mg by mouth daily with breakfast.   warfarin 1 MG tablet Commonly known as: COUMADIN Take 1 mg by mouth daily. What changed: Another medication with the same name was changed. Make sure you understand how and when to take each.   warfarin 3 MG tablet Commonly known as: COUMADIN Take 1 tablet (3 mg total) by mouth daily. What changed:  medication strength how much to take   zinc gluconate 50 MG tablet Take 50 mg by mouth daily.               Durable Medical Equipment  (From admission, onward)           Start     Ordered   05/07/20 1255  For home use only DME Walker rolling  Once       Question Answer Comment  Walker: With 5 Inch Wheels   Patient needs a walker to treat with the following condition Weakness  05/07/20 1254            Vitals:   05/07/20 0807 05/07/20 1144  BP: (!) 157/67 138/69  Pulse: 67 65  Resp: 16 18  Temp: 98.5 F (36.9 C) 98.3 F (36.8 C)  SpO2: 98% 99%    Skin clean, dry and intact without evidence of skin break down, no evidence of skin tears noted. IV catheter discontinued intact. Site without signs and symptoms of complications. Dressing and pressure applied. Pt denies pain at this time. No complaints noted.  An After Visit Summary was printed and given to the patient. Patient escorted via [place], and D/C home via private auto.  Marshall C. Deatra Ina

## 2020-05-07 NOTE — Discharge Summary (Signed)
Colleen Payne KVQ:[id number] DOB: [birth year] DOA: 05/04/2020  PCP: Kirk Ruths, MD  Admit date: 05/04/2020 Discharge date: 05/08/2020  Admitted From: home Disposition:  home  Recommendations for Outpatient Follow-up:  1. Follow up with PCP in 1 week 2. Please obtain BMP/CBC in one week 3. Check INR on 05/08/20  Home Health:yes    Discharge Condition:Stable CODE STATUS:full  Diet recommendation:carb modified   Brief/Interim Summary: Per HPI: Colleen Payne is a 70 year old female with a past medical history significant for type 1 diabetes mellitus with insulin pump, atrial fibrillation, mitral valve prolapse on Coumadin, hypothyroidism, hypertension who presented to [facility] ED on 05/04/2020 due to altered mental status. Patient is currently obtunded, therefore history is obtained from patient's daughter Colleen Payne and ED and nursing notes.  Per patient's daughter, patient has had some altered mental status and hyperglycemia the last couple days. She was seen at the endocrinology clinic yesterday for evaluation of elevated blood sugars and insulin pump. Reportedly the patient has not been bolusing herself appropriately with insulin for meal coverage. Overnight her insulin pump became disconnected, and she was found this morning by her son severely altered and vomiting.  ED Course:  Upon presentation to the ED she was noted to be severely drowsy, but protecting her airway. Work-up shows glucose 1027, bicarb 11, anion gap 35, BUN 62, creatinine 2.17, chloride 89, WBC 16.2, lactic acid 3.1, venous blood gas: pH 7.12/PCO2 26,/PO2 48/bicarb 8.5. Urinalysis is positive for ketones, but not consistent with UTI. Chest x-ray is concerning for left lower lobe consolidation versus atelectasis. CT head is normal. Her COVID-19 PCR and influenza PCR both negative. In the ED she was started on aggressive IV fluids and placed on insulin drip, as well as received empiric azithromycin and Rocephin  for pneumonia coverage.  PCCM is asked to admit the patient to stepdown unit for further work-up and treatment of severe DKA, suspected community-acquired pneumonia vs. Aspiration Pneumonia, and acute metabolic encephalopathy.  Patient was started on DKA protocol with IV insulin.  Once her blood sugars were stable she was transferred to hospitalist service.  Her mental status changes at baseline now.   Severe DKA>>in setting of insulin pump issues.  Resolved PMHx of DM Type 1 with insulin pump Was on DKA protocal with insulin drip BG levels improved Will place her back on insulin pump on discharge. Told daughter to discuss insulin pump management tomorrow with her primary care or his endocrinologist     Suspected  Aspiration Pneumonia (given background circumstance of pt being Altered and found with vomit,  Afebrile, leukocytosis improved Procalcitonin decreasing  Blood cultures NTD Was started on Unasyn, switched to Po augmentine on discharge to complete course.   AKI>>resolved Anion Gap Metabolic Acidosis due to DKA and Lactic acidosis>>resolved Resolved with IVF and improving blood glucose levels/DKA Good po intake now.   Hypokalemia- hypophophatemia hypomagnesium Replaced with supplementation K 5.4 on discharge. Needs repeat labs with pcp She is getting insulin which likely will decrease it on its own     PMHx of atrial fibrillation and mitral valve prolapse on Coumadin, Hypertension Supratherapeutic INR -Continuous cardiac monitoring INR 5.4>>>3.5>>>1.5 Was holding coumadin initially , resume now at 3mg  instead of 5mg  dose . No signs and symptoms of bleeding Discussed with daughter to make sure patient has her INR checked on Monday/18/22 she verbalizes an understanding Continue beta blk    Acute metabolic encephalopathy, suspect due to severe DKA>>improving, not at baseline yet PMHx of Depression and Anxiety -CT head  normal 05/04/2020 -Check urine  drug screen ~positive for opiates Improved, at baseline.      Hypothyroidism -Continue home Synthroid TSH~ 0.359        Discharge Diagnoses:  Active Problems:   DKA (diabetic ketoacidosis) [facility])    Discharge Instructions  Discharge Instructions    Call MD for:  persistant nausea and vomiting   Complete by: As directed    Call MD for:  temperature >100.4   Complete by: As directed    Diet - low sodium heart healthy   Complete by: As directed    Diet Carb Modified   Complete by: As directed    Discharge instructions   Complete by: As directed    Check inr in am, take 3 mg dose of warfarin (coumadin) tonight. Follow up with primary care and endocrinologist if she has one next week Monitor sugars closely   Increase activity slowly   Complete by: As directed      Allergies as of 05/07/2020      Reactions   Lactose Other (See Comments)   Other Reaction: stomach problems Other Reaction: stomach problems   Aspirin Other (See Comments)   stomach problems and cramps   Codeine Other (See Comments)   dizziness   Contrast Media [iodinated Diagnostic Agents] Other (See Comments)   Reddened skin - face and chest. Felt hot, burning sensation   Gadolinium Derivatives Other (See Comments)   Pt c/o nasal stuffiness and flushed. Pt face and upper chest were redden, no hives.      Medication List    STOP taking these medications   cyclobenzaprine 5 MG tablet Commonly known as: FLEXERIL   insulin aspart 100 UNIT/ML injection Commonly known as: NovoLOG   morphine 15 MG tablet Commonly known as: MSIR   oxycodone-acetaminophen 10-300 MG tablet Commonly known as: LYNOX     TAKE these medications   albuterol 108 (90 Base) MCG/ACT inhaler Commonly known as: VENTOLIN HFA Inhale 2 puffs into the lungs every 6 (six) hours as needed.   amitriptyline 25 MG tablet Commonly known as: ELAVIL Take 25 mg by mouth as needed.   amoxicillin-clavulanate 875-125 MG  tablet Commonly known as: AUGMENTIN Take 1 tablet by mouth every 12 (twelve) hours for 1 day.   buprenorphine 15 MCG/HR Commonly known as: BUTRANS Place 15 mcg onto the skin once a week.   DULoxetine HCl 40 MG Cpep Take 1 capsule by mouth daily.   gabapentin 300 MG capsule Commonly known as: NEURONTIN Take 1 capsule by mouth at bedtime.   glucose blood test strip Commonly known as: ONE TOUCH ULTRA TEST USE TO TEST BLOOD SUGAR 8 TIMES DAILY. DX: E10.42   HumaLOG 100 UNIT/ML injection Generic drug: insulin lispro INJECT 1ML (100 UNITS) INTO THE SKIN DAILY. FOR USE IN PUMP   levothyroxine 25 MCG tablet Commonly known as: SYNTHROID Take 25 mcg by mouth daily before breakfast.   metoprolol succinate 50 MG 24 hr tablet Commonly known as: TOPROL-XL Take 50-100 mg by mouth in the morning and at bedtime. 2 tablets in the morning with breakfast and 1 tab at night with dinner   multivitamin with minerals Tabs tablet Take 1 tablet by mouth daily.   ramipril 10 MG capsule Commonly known as: ALTACE Take 10 mg by mouth daily with breakfast.   warfarin 1 MG tablet Commonly known as: COUMADIN Take 1 mg by mouth daily. What changed: Another medication with the same name was changed. Make sure you understand how and when  to take each.   warfarin 3 MG tablet Commonly known as: COUMADIN Take 1 tablet (3 mg total) by mouth daily. What changed:   medication strength  how much to take   zinc gluconate 50 MG tablet Take 50 mg by mouth daily.       Follow-up Information    Kirk Ruths, MD Follow up in 1 week(s).   Specialty: Internal Medicine Contact information: [place] [address] [phone]              Allergies  Allergen Reactions  . Lactose Other (See Comments)    Other Reaction: stomach problems Other Reaction: stomach problems  . Aspirin Other (See Comments)    stomach problems and cramps  . Codeine  Other (See Comments)    dizziness  . Contrast Media [Iodinated Diagnostic Agents] Other (See Comments)    Reddened skin - face and chest. Felt hot, burning sensation  . Gadolinium Derivatives Other (See Comments)    Pt c/o nasal stuffiness and flushed. Pt face and upper chest were redden, no hives.    Consultations:  pccm   Procedures/Studies: CT Head Wo Contrast  Result Date: 05/04/2020 CLINICAL DATA:  70 year old female with altered mental status. Lethargy. Hyperglycemia. EXAM: CT HEAD WITHOUT CONTRAST TECHNIQUE: Contiguous axial images were obtained from the base of the skull through the vertex without intravenous contrast. COMPARISON:  Head CT 07/30/2007.  Brain MRI 08/11/2009. FINDINGS: Brain: Cerebral volume loss since 2009 appears generalized. No midline shift, ventriculomegaly, mass effect, evidence of mass lesion, intracranial hemorrhage or evidence of cortically based acute infarction. Gray-white matter differentiation remains within normal limits for age. No cortical encephalomalacia identified. Vascular: Calcified atherosclerosis at the skull base. No suspicious intracranial vascular hyperdensity. Skull: No acute osseous abnormality identified. Sinuses/Orbits: Visualized paranasal sinuses and mastoids are clear. Other: Postoperative changes to both globes from the prior CT. No acute orbit or scalp soft tissue finding. IMPRESSION: Normal for age noncontrast CT appearance of the brain. Electronically Signed   By: Genevie Ann M.D.   On: 05/04/2020 10:24   DG Chest Portable 1 View  Result Date: 05/04/2020 CLINICAL DATA:  Hyperglycemia, lethargy, history atrial fibrillation, type I diabetes mellitus, hypertension, past history diabetic ketoacidosis EXAM: PORTABLE CHEST 1 VIEW COMPARISON:  Portable exam 0947 hours compared to 06/04/2018 FINDINGS: Normal heart size, mediastinal contours, and pulmonary vascularity. Atelectasis versus consolidation medial LEFT lower lobe. Remaining lungs clear. No  pleural effusion or pneumothorax. Bones demineralized. IMPRESSION: Atelectasis versus consolidation LEFT lower lobe. Electronically Signed   By: Lavonia Dana M.D.   On: 05/04/2020 10:14      Subjective: Feels well. Eating fine. No sob, cp, n/v/abd pain.  Discharge Exam: Vitals:   05/07/20 0807 05/07/20 1144  BP: (!) 157/67 138/69  Pulse: 67 65  Resp: 16 18  Temp: 98.5 F (36.9 C) 98.3 F (36.8 C)  SpO2: 98% 99%   Vitals:   05/07/20 0348 05/07/20 0500 05/07/20 0807 05/07/20 1144  BP: 134/65  (!) 157/67 138/69  Pulse: (!) 58  67 65  Resp: 18  16 18   Temp: 97.8 F (36.6 C)  98.5 F (36.9 C) 98.3 F (36.8 C)  TempSrc: Oral  Oral   SpO2: 97%  98% 99%  Weight:  50.1 kg    Height:        General: Pt is alert, awake, not in acute distress, answers appropriately Cardiovascular: RRR, S1/S2 +, no rubs, no gallops Respiratory: CTA  bilaterally, no wheezing, no rhonchi Abdominal: Soft, NT, ND, bowel sounds + Extremities: no edema    The results of significant diagnostics from this hospitalization (including imaging, microbiology, ancillary and laboratory) are listed below for reference.     Microbiology: Recent Results (from the past 240 hour(s))  Resp Panel by RT-PCR (Flu A&B, Covid) Nasopharyngeal Swab     Status: None   Collection Time: 05/04/20  9:54 AM   Specimen: Nasopharyngeal Swab; Nasopharyngeal(NP) swabs in vial transport medium  Result Value Ref Range Status   SARS Coronavirus 2 by RT PCR NEGATIVE NEGATIVE Final    Comment: (NOTE) SARS-CoV-2 target nucleic acids are NOT DETECTED.  The SARS-CoV-2 RNA is generally detectable in upper respiratory specimens during the acute phase of infection. The lowest concentration of SARS-CoV-2 viral copies this assay can detect is 138 copies/mL. A negative result does not preclude SARS-Cov-2 infection and should not be used as the sole basis for treatment or other patient management decisions. A negative result may occur with   improper specimen collection/handling, submission of specimen other than nasopharyngeal swab, presence of viral mutation(s) within the areas targeted by this assay, and inadequate number of viral copies(<138 copies/mL). A negative result must be combined with clinical observations, patient history, and epidemiological information. The expected result is Negative.  Fact Sheet for Patients:  EntrepreneurPulse.com.au  Fact Sheet for Healthcare Providers:  IncredibleEmployment.be  This test is no t yet approved or cleared by the [place] FDA and  has been authorized for detection and/or diagnosis of SARS-CoV-2 by FDA under an Emergency Use Authorization (EUA). This EUA will remain  in effect (meaning this test can be used) for the duration of the COVID-19 declaration under Section 564(b)(1) of the Act, 21 U.S.C.section 360bbb-3(b)(1), unless the authorization is terminated  or revoked sooner.       Influenza A by PCR NEGATIVE NEGATIVE Final   Influenza B by PCR NEGATIVE NEGATIVE Final    Comment: (NOTE) The Xpert Xpress SARS-CoV-2/FLU/RSV plus assay is intended as an aid in the diagnosis of influenza from Nasopharyngeal swab specimens and should not be used as a sole basis for treatment. Nasal washings and aspirates are unacceptable for Xpert Xpress SARS-CoV-2/FLU/RSV testing.  Fact Sheet for Patients: EntrepreneurPulse.com.au  Fact Sheet for Healthcare Providers: IncredibleEmployment.be  This test is not yet approved or cleared by the [place] FDA and has been authorized for detection and/or diagnosis of SARS-CoV-2 by FDA under an Emergency Use Authorization (EUA). This EUA will remain in effect (meaning this test can be used) for the duration of the COVID-19 declaration under Section 564(b)(1) of the Act, 21 U.S.C. section 360bbb-3(b)(1), unless the authorization is terminated  or revoked.  Performed at [facility], [address] [place], [place] [address]   Blood culture (routine x 2)     Status: None (Preliminary result)   Collection Time: 05/04/20 10:59 AM   Specimen: BLOOD  Result Value Ref Range Status   Specimen Description BLOOD RIGHT ANTECUBITAL  Final   Special Requests   Final    BOTTLES DRAWN AEROBIC AND ANAEROBIC Blood Culture adequate volume   Culture   Final    NO GROWTH 4 DAYS Performed at [facility], [address] [place],  [address]    Report Status PENDING  Incomplete  Culture, blood (Routine X 2) w Reflex to ID Panel     Status: None (Preliminary result)   Collection Time: 05/04/20 10:47 PM   Specimen: BLOOD  Result Value  Ref Range Status   Specimen Description BLOOD BLOOD LEFT HAND  Final   Special Requests   Final    BOTTLES DRAWN AEROBIC AND ANAEROBIC Blood Culture adequate volume   Culture   Final    NO GROWTH 4 DAYS Performed at [facility], [address] [place], [place] [address]    Report Status PENDING  Incomplete     Labs: BNP (last 3 results) No results for input(s): BNP in the last 8760 hours. Basic Metabolic Panel: Recent Labs  Lab 05/04/20 1458 05/04/20 1906 05/04/20 2247 05/05/20 0308 05/06/20 0322 05/06/20 1449 05/07/20 0408 05/07/20 1648  NA  --  143 143 143 139  --  139  --   K  --  3.7 3.4* 3.3* 2.3* 3.3* 3.1* 5.4*  CL  --  106 105 108 99  --  100  --   CO2  --  24 29 28  34*  --  30  --   GLUCOSE  --  221* 148* 157* 172*  --  76  --   BUN  --  43* 40* 36* 13  --  9  --   CREATININE  --  1.06* 1.03* 0.78 0.48  --  0.44  --   CALCIUM  --  9.4 9.0 9.2 8.6*  --  8.8*  --   MG 2.2  --   --  1.7 1.5*  --  2.1  --   PHOS  --   --   --  1.4* 1.7*  --  1.6* 4.5   Liver Function Tests: Recent Labs  Lab 05/04/20 0932  AST 41  ALT 29  ALKPHOS 108  BILITOT 2.8*  PROT 7.8  ALBUMIN 4.6   No results for input(s): LIPASE, AMYLASE in the last 168 hours. No  results for input(s): AMMONIA in the last 168 hours. CBC: Recent Labs  Lab 05/04/20 0932 05/05/20 0308 05/06/20 0322 05/07/20 0408  WBC 16.2* 18.4* 8.2 5.8  HGB 14.5 11.0* 10.1* 12.1  HCT 45.2 30.3* 28.7* 35.3*  MCV 106.4* 95.6 98.0 97.8  PLT 365 224 150 152   Cardiac Enzymes: No results for input(s): CKTOTAL, CKMB, CKMBINDEX, TROPONINI in the last 168 hours. BNP: Invalid input(s): POCBNP CBG: Recent Labs  Lab 05/07/20 0733 05/07/20 1211 05/07/20 1704 05/07/20 1835 05/07/20 1853  GLUCAP 211* 129* 379* 335* 269*   D-Dimer No results for input(s): DDIMER in the last 72 hours. Hgb A1c No results for input(s): HGBA1C in the last 72 hours. Lipid Profile No results for input(s): CHOL, HDL, LDLCALC, TRIG, CHOLHDL, LDLDIRECT in the last 72 hours. Thyroid function studies No results for input(s): TSH, T4TOTAL, T3FREE, THYROIDAB in the last 72 hours.  Invalid input(s): FREET3 Anemia work up No results for input(s): VITAMINB12, FOLATE, FERRITIN, TIBC, IRON, RETICCTPCT in the last 72 hours. Urinalysis    Component Value Date/Time   COLORURINE STRAW (A) 05/04/2020 0954   APPEARANCEUR CLEAR (A) 05/04/2020 0954   LABSPEC 1.017 05/04/2020 0954   PHURINE 5.0 05/04/2020 0954   GLUCOSEU >=500 (A) 05/04/2020 0954   HGBUR SMALL (A) 05/04/2020 0954   BILIRUBINUR NEGATIVE 05/04/2020 0954   KETONESUR 80 (A) 05/04/2020 0954   PROTEINUR NEGATIVE 05/04/2020 0954   NITRITE NEGATIVE 05/04/2020 0954   LEUKOCYTESUR NEGATIVE 05/04/2020 0954   Sepsis Labs Invalid input(s): PROCALCITONIN,  WBC,  LACTICIDVEN Microbiology Recent Results (from the past 240 hour(s))  Resp Panel by RT-PCR (Flu A&B, Covid) Nasopharyngeal Swab     Status: None  Collection Time: 05/04/20  9:54 AM   Specimen: Nasopharyngeal Swab; Nasopharyngeal(NP) swabs in vial transport medium  Result Value Ref Range Status   SARS Coronavirus 2 by RT PCR NEGATIVE NEGATIVE Final    Comment: (NOTE) SARS-CoV-2 target nucleic  acids are NOT DETECTED.  The SARS-CoV-2 RNA is generally detectable in upper respiratory specimens during the acute phase of infection. The lowest concentration of SARS-CoV-2 viral copies this assay can detect is 138 copies/mL. A negative result does not preclude SARS-Cov-2 infection and should not be used as the sole basis for treatment or other patient management decisions. A negative result may occur with  improper specimen collection/handling, submission of specimen other than nasopharyngeal swab, presence of viral mutation(s) within the areas targeted by this assay, and inadequate number of viral copies(<138 copies/mL). A negative result must be combined with clinical observations, patient history, and epidemiological information. The expected result is Negative.  Fact Sheet for Patients:  EntrepreneurPulse.com.au  Fact Sheet for Healthcare Providers:  IncredibleEmployment.be  This test is no t yet approved or cleared by the [place] FDA and  has been authorized for detection and/or diagnosis of SARS-CoV-2 by FDA under an Emergency Use Authorization (EUA). This EUA will remain  in effect (meaning this test can be used) for the duration of the COVID-19 declaration under Section 564(b)(1) of the Act, 21 U.S.C.section 360bbb-3(b)(1), unless the authorization is terminated  or revoked sooner.       Influenza A by PCR NEGATIVE NEGATIVE Final   Influenza B by PCR NEGATIVE NEGATIVE Final    Comment: (NOTE) The Xpert Xpress SARS-CoV-2/FLU/RSV plus assay is intended as an aid in the diagnosis of influenza from Nasopharyngeal swab specimens and should not be used as a sole basis for treatment. Nasal washings and aspirates are unacceptable for Xpert Xpress SARS-CoV-2/FLU/RSV testing.  Fact Sheet for Patients: EntrepreneurPulse.com.au  Fact Sheet for Healthcare Providers: IncredibleEmployment.be  This  test is not yet approved or cleared by the [place] FDA and has been authorized for detection and/or diagnosis of SARS-CoV-2 by FDA under an Emergency Use Authorization (EUA). This EUA will remain in effect (meaning this test can be used) for the duration of the COVID-19 declaration under Section 564(b)(1) of the Act, 21 U.S.C. section 360bbb-3(b)(1), unless the authorization is terminated or revoked.  Performed at [facility], [address] [place], [place] [address]   Blood culture (routine x 2)     Status: None (Preliminary result)   Collection Time: 05/04/20 10:59 AM   Specimen: BLOOD  Result Value Ref Range Status   Specimen Description BLOOD RIGHT ANTECUBITAL  Final   Special Requests   Final    BOTTLES DRAWN AEROBIC AND ANAEROBIC Blood Culture adequate volume   Culture   Final    NO GROWTH 4 DAYS Performed at [facility], [address], [place], [place] [address]    Report Status PENDING  Incomplete  Culture, blood (Routine X 2) w Reflex to ID Panel     Status: None (Preliminary result)   Collection Time: 05/04/20 10:47 PM   Specimen: BLOOD  Result Value Ref Range Status   Specimen Description BLOOD BLOOD LEFT HAND  Final   Special Requests   Final    BOTTLES DRAWN AEROBIC AND ANAEROBIC Blood Culture adequate volume   Culture   Final    NO GROWTH 4 DAYS Performed at [facility], [address], [place], [place] [address]    Report Status PENDING  Incomplete  Time coordinating discharge: Over 30 minutes  SIGNED:   Nolberto Hanlon, MD  [facility] 05/08/2020, 5:59 PM Pager   If 7PM-7AM, please contact night-coverage [email or web address] Password [email or web address]

## 2020-05-07 NOTE — Plan of Care (Signed)
  Problem: Clinical Measurements: Goal: Ability to maintain clinical measurements within normal limits will improve Outcome: Progressing Goal: Will remain free from infection Outcome: Progressing Goal: Diagnostic test results will improve Outcome: Progressing Goal: Respiratory complications will improve Outcome: Progressing Goal: Cardiovascular complication will be avoided Outcome: Progressing   Problem: Pain Managment: Goal: General experience of comfort will improve Outcome: Progressing   Patient is alert and oriented X3, forgetful. V/S stable. Denies any pain or SOB. Had 3 soft stools this shift.

## 2020-05-07 NOTE — Progress Notes (Signed)
Patient CBG was 129. Glucometer did not carry over.

## 2020-05-07 NOTE — Progress Notes (Addendum)
Per MD and PT, patient discharging home today, needs RW and HHPT. Patient is disoriented. CSW left VM for daughter requesting a return call regarding HHPT. Ordered RW through [employer] to be delivered to bedside today prior to DC.   2:20- Attempted another call to patient's daughter [name]. Left another VM.  Oleh Genin, [place]

## 2020-05-07 NOTE — Progress Notes (Signed)
Inpatient Diabetes Program Recommendations  AACE/ADA: New Consensus Statement on Inpatient Glycemic Control   Target Ranges:  Prepandial:   less than 140 mg/dL      Peak postprandial:   less than 180 mg/dL (1-2 hours)      Critically ill patients:  140 - 180 mg/dL  Results for Colleen Payne, Colleen Payne (MRN [id number]) as of 05/07/2020 06:52  Ref. Range 05/06/2020 07:34 05/06/2020 11:21 05/06/2020 15:32 05/06/2020 17:17 05/06/2020 20:07 05/06/2020 21:45  Glucose-Capillary Latest Ref Range: 70 - 99 mg/dL 140 (H) 427 (H)  Novolog 6 units  Levemir 5 units 247 (H) 220 (H)  Novolog 5 units 98 165 (H)  Levemir 5 units    Review of Glycemic Control  Diabetes history: DM1 (makes NO insulin; requires basal, correction, and carb coverage insulin) Outpatient Diabetes medications: Medtronic insulin pump (total basal 13.35 units, I:CR 1:16 grams, ISF 1:60 mg/dl) Current orders for Inpatient glycemic control: Levemir 5 units Q12H, Novolog 0-6 units TID with meals, Novolog 0-5 units QHS, Novolog 3 units TID with meals for meal coverage  Inpatient Diabetes Program Recommendations:    Insulin: If patient will resume her insulin pump today, please discontinue all SQ insulin and order insulin pump using Insulin Pump order set.  If SQ insulin will be continued, please decrease Levemir to 4 units Q12H.  Thanks, Barnie Alderman, RN, MSN, CDE Diabetes Coordinator Inpatient Diabetes Program [phone] (Team Pager from 8am to 5pm)

## 2020-05-07 NOTE — Progress Notes (Signed)
PHARMACY CONSULT NOTE - FOLLOW UP  Pharmacy Consult for Electrolyte Monitoring and Replacement   Recent Labs: Potassium (mmol/L)  Date Value  05/07/2020 3.1 (L)   Magnesium (mg/dL)  Date Value  05/07/2020 2.1   Calcium (mg/dL)  Date Value  05/07/2020 8.8 (L)   Albumin (g/dL)  Date Value  05/04/2020 4.6   Phosphorus (mg/dL)  Date Value  05/07/2020 1.6 (L)   Sodium (mmol/L)  Date Value  05/07/2020 139     Assessment: 70yo female with PMH for T1DM, afib, mitral valve prolapse, hypothyroidism, HTN who was admitted with severe DKA. Pharmacy has been consulted for electrolyte monitoring and replacement.   Pt transitioned off of insulin drip 4/15 NS @50ml /hr  Goal of Therapy:  K 4.0 - 5.1 mmol/L Mg 2.0 - 2.4 mmol/L All other electrolytes WNL   Plan:  4/17 K  3.1 Mag 2.1  Phos 1.6  Scr 0.44 Na 139  Currently MD ordered KCL 20 meq packet x 1 (already given this am) and KCL 20 meq packet x 1 at 0900. -Will order Potassium Phosphate 30 mmol IV x 1 dose (44 meq K+) - will recheck K and Phos at 2000 F/u electrolytes in am   Chinita Greenland PharmD Clinical Pharmacist 05/07/2020

## 2020-05-07 NOTE — TOC Initial Note (Addendum)
Transition of Care [facility]) - Initial/Assessment Note    Patient Details  Name: Colleen Payne MRN: [id number] Date of Birth: 1950/09/30  Transition of Care [facility]) CM/SW Contact:    Magnus Ivan, LCSW Phone Number: 05/07/2020, 2:43 PM  Clinical Narrative:                Patient has orders to discharge home with home health today. Patient with fluctuating orientation per chart. CSW spoke with patient's daughter [name]. [name] reported patient has been living alone at her home ([address], [place], [place]) and driving herself to appointments until last week. Her children will provide transportation for now. PCP is Dr. Ouida Sills. Pharmacy is [facility] on [address]. Patient has a cane and walker with no wheels. RW has been ordered through [employer] to be delivered to bedside prior to DC per PT recommendations. Patient will be staying with her son at night ([address] ,[place], [place]) and at her home during the day post discharge. [name] was agreeable to [facility] and no agency preference. Referral accepted by [facility] Representative [place], informed [place] of addresses and to call [name] for scheduling per her request. [name] reported they are in need of sitter/aide services as well. Asked MD to add Aide to [facility] orders. [name] is aware [place] disciplines only come out a few times a week. She is interested in private pay options as she does not think they would qualify for PCS through DSS. Informed her of private pay sitter/aide option but [facility] does not endorse specific agencies for this. No other TOC needs identified prior to DC.   Expected Discharge Plan: [place] Barriers to Discharge: Barriers Resolved   Patient Goals and CMS Choice Patient states their goals for this hospitalization and ongoing recovery are:: home with home health CMS Medicare.gov Compare Post Acute Care list provided to:: Patient Represenative (must comment) Choice offered to / list presented  to : Adult Children  Expected Discharge Plan and Services Expected Discharge Plan: [place]       Living arrangements for the past 2 months: Single Family Home Expected Discharge Date: 05/07/20               DME Arranged: Gilford Rile rolling DME Agency:  Celesta Aver) Date DME Agency Contacted: 05/07/20   Representative spoke with at DME Agency: Melene Muller [facility] Arranged: PT,OT,Nurse's [place] Agency: [facility] Date [place]: 05/07/20   Representative spoke with at [place]: Jana Half  Prior Living Arrangements/Services Living arrangements for the past 2 months: [place] with:: Adult Children Patient language and need for interpreter reviewed:: Yes Do you feel safe going back to the place where you live?: Yes      Need for Family Participation in Patient Care: Yes (Comment) Care giver support system in place?: Yes (comment) Current home services: DME Criminal Activity/Legal Involvement Pertinent to Current Situation/Hospitalization: No - Comment as needed  Activities of Daily Living      Permission Sought/Granted Permission sought to share information with : Chartered certified accountant granted to share information with : Yes, Verbal Permission Granted     Permission granted to share info w AGENCY: [place], DME agencies        Emotional Assessment       Orientation: : Fluctuating Orientation (Suspected and/or reported Sundowners) Alcohol / Substance Use: Not Applicable Psych Involvement: No (comment)  Admission diagnosis:  DKA (diabetic ketoacidosis) ([place]) [  E11.10] Altered mental status, unspecified altered mental status type [R41.82] Diabetic ketoacidosis without coma associated with type 1 diabetes mellitus ([place]) [E10.10] Patient Active Problem List   Diagnosis Date Noted  . DKA (diabetic ketoacidosis) ([place]) 05/04/2020  . Central sleep apnea 10/09/2016  . Hypertension 10/09/2016  . Lumbar  postlaminectomy syndrome 10/09/2016  . Lumbar radiculopathy 10/09/2016  . Diabetic polyneuropathy ([place]) 10/07/2016  . Right lumbosacral radiculopathy 10/07/2016  . Healthcare maintenance 08/26/2016  . DKA (diabetic ketoacidoses) 06/14/2016  . Atypical chest pain 02/23/2016  . SOB (shortness of breath) 02/23/2016  . Pain medication agreement signed 01/13/2015  . Acquired hypothyroidism 05/30/2014  . Frontal sinusitis 12/17/2013  . Atrial fibrillation ([place]) 11/29/2013  . Major depression in remission ([place]) 11/20/2013  . 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 Chronic pain syndrome 08/27/2013  . Back pain 07/17/2013  . HTN (hypertension), benign 07/17/2013  . Hyperlipidemia 07/17/2013  . Pancreatic insufficiency 07/17/2013  . Chronic postoperative pain 01/01/2013  . Long term current use of opiate analgesic 11/04/2012  . Hypothyroidism 08/25/2012  . Radiculopathy of leg 05/01/2011   PCP:  Kirk Ruths, MD Pharmacy:   [facility] [address] [place] [address] Phone: [phone] Fax: [phone]  [id number] [place], [place] [address] Phone: [phone] Fax: [phone]  [place], [place] [address] [address] Phone: [phone] Fax: [phone]     Social Determinants of Health (SDOH) Interventions    Readmission Risk Interventions No flowsheet data found.

## 2020-05-07 NOTE — Evaluation (Signed)
Physical Therapy Evaluation Patient Details Name: Colleen Payne MRN: [id number] DOB: 1950/05/15 Today's Date: 05/07/2020   History of Present Illness  presented to ER secondary to AMS, vomiting; admitted for management of severe DKA, acute metabolic encephalopathy.  Clinical Impression  Patient resting, supine in bed, upon arrival to room; alert and oriented to basic information.  Follows commands and agreeable to participation with session, but does endorse "needing to be cleaned up".  Assisted with peri-care, hygiene and clothing/linen change as needed; dep assist for hygiene, sup/mod indep for bed mobility.  Denies overall pain at this time, but does endorse some soreness to peri-area (with noted redness); RN informed/aware and barrier cream applied.  Bilat UE/LE strength and ROM grossly symmetrical and WFL; no focal weakness noted.  Significant lower thoracic scoliosis (curve towards L) noted; endorses participation with outpatient PT to address prior to this admission.  Able to complete bed mobility with cga/close sup; sit/stand, basic transfers and gait (10' x2) with RW, cga/min assist. Demonstrates fair step height/length, negotiates turns/obstacles without difficulty.  does endorse generalized weakness to bilat LEs; anticipate improvement with continued mobiltiy opportunities.  Do recommend continued use of RW and +1 cga/close sup for all mobiltiy at this time Would benefit from skilled PT to address above deficits and promote optimal return to PLOF.; Recommend transition to HHPT upon discharge from acute hospitalization.     Follow Up Recommendations Home health PT    Equipment Recommendations  Rolling walker with 5" wheels    Recommendations for Other Services       Precautions / Restrictions Precautions Precautions: Fall Restrictions Weight Bearing Restrictions: No      Mobility  Bed Mobility Overal bed mobility: Needs Assistance Bed Mobility: Supine to Sit     Supine  to sit: Supervision;Min guard     General bed mobility comments: increased time/effort required to complete    Transfers Overall transfer level: Needs assistance Equipment used: Rolling walker (2 wheeled) Transfers: Sit to/from Stand Sit to Stand: Min guard         General transfer comment: cuing for hand placement, tends to pull on RW  Ambulation/Gait Ambulation/Gait assistance: Min guard Gait Distance (Feet):  (10' x2) Assistive device: Rolling walker (2 wheeled)       General Gait Details: fair step height/length, negotiates turns/obstacles without difficulty.  does endorse generalized weakness to bilat LEs; anticipate improvement with continued mobiltiy opportunities.  Do recommend continued use of RW and +1 cga/close sup for all mobiltiy at this time  Financial trader Rankin (Stroke Patients Only)       Balance Overall balance assessment: Needs assistance Sitting-balance support: No upper extremity supported;Feet supported Sitting balance-Leahy Scale: Good     Standing balance support: Bilateral upper extremity supported Standing balance-Leahy Scale: Fair                               Pertinent Vitals/Pain Pain Assessment: No/denies pain    Home Living Family/patient expects to be discharged to:: Private residence Living Arrangements: Alone Available Help at Discharge: Family;Available PRN/intermittently Type of Home: House Home Access: Ramped entrance     Home Layout: One level Home Equipment: Cane - quad      Prior Function Level of Independence: Independent with assistive device(s)         Comments: Mod indep with SBQC; denies fall history.  Husband recently passed away in 03/11/20; was primary caregiver for husband prior to passing.     Hand Dominance   Dominant Hand: Right    Extremity/Trunk Assessment   Upper Extremity Assessment Upper Extremity Assessment: Generalized weakness     Lower Extremity Assessment Lower Extremity Assessment: Generalized weakness (grossly 4-/5 throughout)    Cervical / Trunk Assessment Cervical / Trunk Assessment: Kyphotic (forward head, rounded shoulder; lower thoracic scoliosis with curvature towards L)  Communication   Communication: No difficulties  Cognition Arousal/Alertness: Awake/alert Behavior During Therapy: WFL for tasks assessed/performed Overall Cognitive Status: Within Functional Limits for tasks assessed                                        General Comments      Exercises Other Exercises Other Exercises: Noted with bowel incontinence upon arrival to room; dep assist for hygiene in supine position.  Rolling bilat, mod indep. Significant redness to peri-area; RN informed/aware and present for assessment, barrier cream applied. Other Exercises: Toilet transfer, ambulatory with RW, cga/min assist; min cuing for walker position and overall safety.   Assessment/Plan    PT Assessment Patient needs continued PT services  PT Problem List Decreased strength;Decreased activity tolerance;Decreased balance;Decreased mobility;Decreased coordination;Decreased range of motion;Decreased knowledge of use of DME;Decreased safety awareness;Decreased knowledge of precautions       PT Treatment Interventions DME instruction;Gait training;Functional mobility training;Therapeutic activities;Therapeutic exercise;Balance training;Patient/family education    PT Goals (Current goals can be found in the Care Plan section)  Acute Rehab PT Goals Patient Stated Goal: to return home PT Goal Formulation: With patient Time For Goal Achievement: 05/21/20 Potential to Achieve Goals: Good    Frequency Min 2X/week   Barriers to discharge Decreased caregiver support      Co-evaluation               AM-PAC PT "6 Clicks" Mobility  Outcome Measure Help needed turning from your back to your side while in a flat bed  without using bedrails?: None Help needed moving from lying on your back to sitting on the side of a flat bed without using bedrails?: A Little Help needed moving to and from a bed to a chair (including a wheelchair)?: A Little Help needed standing up from a chair using your arms (e.g., wheelchair or bedside chair)?: A Little Help needed to walk in hospital room?: A Little Help needed climbing 3-5 steps with a railing? : A Little 6 Click Score: 19    End of Session Equipment Utilized During Treatment: Gait belt Activity Tolerance: Patient tolerated treatment well Patient left: in chair;with call bell/phone within reach;with chair alarm set Nurse Communication: Mobility status PT Visit Diagnosis: Muscle weakness (generalized) (M62.81);Difficulty in walking, not elsewhere classified (R26.2)    Time: 2094-7096 PT Time Calculation (min) (ACUTE ONLY): 38 min   Charges:   PT Evaluation $PT Eval Moderate Complexity: 1 Mod PT Treatments $Therapeutic Activity: 23-37 mins        Shanieka Blea H. Owens Shark, PT, DPT, NCS 05/07/20, 12:43 PM [phone]

## 2020-05-08 LAB — GLUCOSE, CAPILLARY: Glucose-Capillary: 129 mg/dL — ABNORMAL HIGH (ref 70–99)

## 2020-05-09 LAB — CULTURE, BLOOD (ROUTINE X 2)
Culture: NO GROWTH
Culture: NO GROWTH
Special Requests: ADEQUATE
Special Requests: ADEQUATE

## 2020-05-12 ENCOUNTER — Ambulatory Visit: Payer: Medicare Other | Admitting: Physical Therapy

## 2020-05-19 ENCOUNTER — Ambulatory Visit: Payer: Medicare Other | Admitting: Physical Therapy

## 2020-05-26 ENCOUNTER — Ambulatory Visit: Payer: Medicare Other | Attending: Anesthesiology | Admitting: Physical Therapy

## 2020-06-02 ENCOUNTER — Ambulatory Visit: Payer: Medicare Other | Admitting: Physical Therapy

## 2020-06-09 ENCOUNTER — Ambulatory Visit: Payer: Medicare Other | Admitting: Physical Therapy

## 2020-06-16 ENCOUNTER — Ambulatory Visit: Payer: Medicare Other | Admitting: Physical Therapy

## 2020-06-23 ENCOUNTER — Ambulatory Visit: Payer: Medicare Other | Attending: Anesthesiology | Admitting: Physical Therapy

## 2020-06-30 ENCOUNTER — Ambulatory Visit: Payer: Medicare Other | Admitting: Physical Therapy

## 2020-07-07 ENCOUNTER — Encounter: Payer: Medicare Other | Admitting: Physical Therapy

## 2020-07-14 ENCOUNTER — Encounter: Payer: Medicare Other | Admitting: Physical Therapy

## 2020-08-01 ENCOUNTER — Ambulatory Visit: Payer: Medicare Other | Admitting: Physical Therapy

## 2020-08-02 ENCOUNTER — Ambulatory Visit: Payer: Medicare Other | Admitting: Physical Therapy

## 2020-08-07 ENCOUNTER — Ambulatory Visit: Payer: Medicare Other | Admitting: Physical Therapy

## 2020-08-09 ENCOUNTER — Encounter: Payer: Self-pay | Admitting: Physical Therapy

## 2020-08-09 ENCOUNTER — Ambulatory Visit: Payer: Medicare Other | Admitting: Physical Therapy

## 2020-08-09 ENCOUNTER — Ambulatory Visit: Payer: Medicare Other | Attending: Anesthesiology | Admitting: Physical Therapy

## 2020-08-09 DIAGNOSIS — M5416 Radiculopathy, lumbar region: Secondary | ICD-10-CM | POA: Insufficient documentation

## 2020-08-09 DIAGNOSIS — M4126 Other idiopathic scoliosis, lumbar region: Secondary | ICD-10-CM

## 2020-08-09 NOTE — Therapy (Signed)
[facility] [address] [place], [address] Phone: [phone]   Fax:  [phone]  Physical Therapy Re-Evaluation  Patient Details  Payne: Colleen Payne MRN: [id number] Date of Birth: [birth year] No data recorded  Encounter Date: 08/09/2020   PT End of Session - 08/09/20 1040     Visit Number 1    Number of Visits 17    Date for PT Re-Evaluation 11/01/20    Authorization Type Medicare    Authorization - Visit Number 1    Authorization - Number of Visits 10    PT Start Time 0945    PT Stop Time 1030    PT Time Calculation (min) 45 min    Equipment Utilized During Treatment Other (comment)    Activity Tolerance Patient tolerated treatment well;No increased pain    Behavior During Therapy WFL for tasks assessed/performed             Past Medical History:  Diagnosis Date   Anxiety    Atrial fibrillation ([place])    Depression    Diabetes mellitus without complication ([place])    Type I    Headache    stress. 1x/month   Hypertension    Insulin pump in place    Motion sickness    cars   MVP (mitral valve prolapse)    Neuromuscular disorder ([facility])    leg weakness s/p back surgeries   Thyroid disease     Past Surgical History:  Procedure Laterality Date   BACK SURGERY  2004   3 sugeries between Sept and Nov, fusion L2-S1   CATARACT EXTRACTION W/PHACO Left 11/23/2014   Procedure: CATARACT EXTRACTION PHACO AND INTRAOCULAR LENS PLACEMENT ([place]);  Surgeon: Leandrew Koyanagi, MD;  Location: [facility];  Service: Ophthalmology;  Laterality: Left;  DIABETIC - insulin pump    There were no vitals filed for this visit.    Subjective Assessment - 08/09/20 0950     Pertinent History Patinet returns to PT following 3 month absence for re-evaluation of LBP. Is having pain straight across her low back that is consistent. Reports pain has been an 8/10 over the past month, does not get any better,  can get up to 9/10. Pain is a consistent dull, ache. She reports her blood sugar has been better since she was here last, and that her son has been helping her with this. She has been living between her home and her son's d/t her memory issues since her husband died. She is basically living with her son full time, spends the night there, but does go to her house during the day. Her son has 3-4 steps to enter her his home through the garage with 1 handrail, but the front door has no handrail, and she cannot use those. She does not have to go upstairs at his house for her bedroom. She reports her son and wife are doing most of the cooking, she is completing her own cleaning, dressing, bathing (gets down in tub and gets out good with grab bar modI). No falls since last visit. Drives and completes community errands with SPC. She does have some memory issues that she reports are more short term or recent memories. ?Pt denies N/V, B&B changes, unexplained weight fluctuation, saddle paresthesia, fever, night sweats, or unrelenting night pain at this time.    Limitations Standing;Walking;House hold activities;Other (comment)    How long can you sit comfortably? unlimited    How long  can you stand comfortably? 1 hour    How long can you walk comfortably? with SPC 1 hour    Diagnostic tests see exray results: Fairly stable appearance of the levoscoliosis of the lower thoracic and lumbar spine. Stable lateral subluxation toward left of L2 with respect of L3, stable partial compression of the body of L1.    Patient Stated Goals pain relief, standing erect    Currently in Pain? Yes    Pain Score 5     Pain Location Back    Pain Orientation Lower    Pain Descriptors / Indicators Constant;Aching;Dull    Pain Type Chronic pain    Pain Onset More than a month ago    Pain Frequency Constant    Aggravating Factors  forward bend/pull (laudry), trying to stand erect, pulling/lifting    Pain Relieving Factors laying down     Effect of Pain on Daily Activities unable to complete ADLs                     OBJECTIVE  Mental Status Patient is oriented to person, place and time.  Recent memory is intact.  Remote memory is intact.  Attention span and concentration are intact.  Expressive speech is intact.  Patient's fund of knowledge is within normal limits for educational level.  SENSATION: Grossly intact to light touch bilateral LEs as determined by testing dermatomes L2-S2 Proprioception and hot/cold testing deferred on this date   MUSCULOSKELETAL: Tremor: None Bulk: Normal Tone: Normal No visible step-off along spinal column  Posture Levoscolosis with increased R pelvic height, decreased R shoulder height, protruding L thorax Lower crossed syndrome (tight hip flexors and erector spinae; weak gluts and abs): positive  Gait Maintained posture throughout, slow speed, SPC    Palpation  TTP with trigger points/tension of R sided thoracic/lumbar paraspinals TTP to bilat glutes  Strength (out of 5) R/L 5/5 Hip flexion 5/4+* Hip ER 5/5* Hip IR 4+/4+ Hip abduction 4+/4+ Hip adduction 2+/2+ Hip extension 5 Trunk flexion 2+ Trunk extension 5/5 Trunk rotation  *Indicates pain   AROM (degrees) R/L (all movements include overpressure unless otherwise stated) Lumbar forward flexion (65): WNL Lumbar extension (30): 0d Lumbar lateral flexion (25): R excessive L 25% limited Thoracic and Lumbar rotation (30 degrees):  WNL with movement dysfunction Hip IR (0-45): WNL Hip ER (0-45): WNL Hip Flexion (0-125): WNL Hip Abduction (0-40): WNL Hip extension (0-15): -5/0 *Indicates pain   PROM (degrees) PROM = AROM   Repeated Movements No centralization or peripheralization of symptoms with repeated lumbar extension or flexion.    Muscle Length Hamstrings: shortened bilat E[place]: shortened bilat Thomas: shortened bilat Ober: WNL   Passive Accessory Intervertebral Motion (PAIVM) Pt  denies reproduction of back pain with CPA L1-L5 and UPA bilaterally L1-L5. Generally hypomobile throughout  Passive Physiological Intervertebral Motion (PPIVM) Normal flexion and extension with PPIVM testing   SPECIAL TESTS  Functional Tasks  10MWT fastest: 0.2m/s Self selected 0.58m/s  5xSTS 14.3sec 30sec STS 11.5 STS  SLS RLE: 2sec/2sec LL: 3sec/4sec    Ther-Ex PT reviewed the following HEP with patient with patient able to demonstrate a set of the following with min cuing for correction needed. PT educated patient on parameters of therex (how/when to inc/decrease intensity, frequency, rep/set range, stretch hold time, and purpose of therex) with verbalized understanding.  Access Code: [email or web address] Modified Thomas Stretch - 2-3 x daily - 7 x weekly - 30sec hold Lying Prone - 3 x daily - 7  x weekly - 75mins hold              Objective measurements completed on examination: See above findings.               PT Education - 08/09/20 1043     Education Details therex form/technique    Person(s) Educated Patient    Methods Explanation;Demonstration;Verbal cues    Comprehension Verbalized understanding;Returned demonstration;Verbal cues required              PT Short Term Goals - 08/09/20 1005       PT SHORT TERM GOAL #1   Title Patient will be adherent to initial HEP at least 4x a week to improve functional strength and balance for better safety at home.    Baseline 08/09/20 HEP given    Time 4    Period Weeks    Status New      PT SHORT TERM GOAL #2   Title Pt will increase 10MWT by at least 0.13 m/s in order to demonstrate clinically significant improvement in community ambulation    Baseline 08/09/20 0.72m/s    Time 4    Period Weeks    Status New               PT Long Term Goals - 08/09/20 1010       PT LONG TERM GOAL #1   Title Pt will demonstrate 5xSTS of 12sec or less to demonstrate age-matched normalized BLE strength needed for  heavy household ADLs    Baseline 08/11/20 14.3sec    Time 8    Period Weeks    Status New      PT LONG TERM GOAL #2   Title Pt will increase 10MWT by at least 1.26m/s in order to demonstrate clinically significant improvement, and safety with independent community ambulation    Baseline 08/11/20 0.17m/s    Time 8    Period Weeks    Status New      PT LONG TERM GOAL #3   Title Patient will report a worst pain of 5/10 with household ADLs to improve tolerance with ADLs    Baseline 08/09/20 8/10 with all tasks    Time 8    Period Weeks    Status New      PT LONG TERM GOAL #4   Title Patient will demonstrate 15 STS in 30sec to demonstrate age-matched increase in LE endurance    Baseline 08/11/20 11.5    Time 8    Period Weeks    Status New      PT LONG TERM GOAL #5   Title Patient will demonstrate SLS of 10sec to demonstrate decreased fall risk    Baseline 08/11/20 RLE: 2sec LLE: 3sec    Time 8    Period Weeks    Status New                    Plan - 08/09/20 1037     Clinical Impression Statement PT completed re-evaluation of chronic LBP and decreased function. Impairments in gait speed, BLE and core strength, decreased endurance, decreased balance, and pain. Activity limitations in ind transfers, community ambulation, laundry, lifting, pushing, pulling; inhibiting full participation in ind ADLs. Pt will benefit from skille dPT to increase ind and safety with ADLs, and improve QOL.    Personal Factors and Comorbidities Age;Behavior Pattern;Fitness;Past/Current Experience;Comorbidity 1;Comorbidity 2;Comorbidity 3+    Comorbidities scolosis, DM1, chronic pain syndrome, HTN, afib, depression  Examination-Activity Limitations Bathing;Dressing;Transfers;Caring for Others;Carry;Squat;Lift;Bend;Bed Mobility    Examination-Participation Restrictions Yard Work;Laundry;Cleaning;Community Activity    Stability/Clinical Decision Making Evolving/Moderate complexity    Clinical  Decision Making Moderate    Rehab Potential Fair    Clinical Impairments Affecting Rehab Potential comorbidities, symptom duration, curvature degree    PT Frequency 2x / week    PT Duration 2 weeks    PT Treatment/Interventions ADLs/Self Care Home Management;Aquatic Therapy;Cryotherapy;Ultrasound;Parrafin;Traction;Moist Heat;Electrical Stimulation;DME Instruction;Gait training;Stair training;Functional mobility training;Neuromuscular re-education;Balance training;Therapeutic exercise;Therapeutic activities;Patient/family education;Energy conservation;Spinal Manipulations;Joint Manipulations;Passive range of motion;Dry needling;Manual techniques    PT Next Visit Plan continue POC    PT Home Exercise Plan Added standing pec stretch in doorway to HEP today    Consulted and Agree with Plan of Care Patient             Patient will benefit from skilled therapeutic intervention in order to improve the following deficits and impairments:  Abnormal gait, Decreased balance, Decreased endurance, Decreased mobility, Difficulty walking, Hypomobility, Increased muscle spasms, Decreased range of motion, Pain, Postural dysfunction, Impaired flexibility, Increased fascial restricitons, Decreased strength, Decreased activity tolerance  Visit Diagnosis: Other idiopathic scoliosis, lumbar region  Radiculopathy, lumbar region     Problem List Patient Active Problem List   Diagnosis Date Noted   DKA (diabetic ketoacidosis) ([place]) 05/04/2020   Central sleep apnea 10/09/2016   Hypertension 10/09/2016   Lumbar postlaminectomy syndrome 10/09/2016   Lumbar radiculopathy 10/09/2016   Diabetic polyneuropathy ([place]) 10/07/2016   Right lumbosacral radiculopathy 10/07/2016   Healthcare maintenance 08/26/2016   DKA (diabetic ketoacidoses) 06/14/2016   Atypical chest pain 02/23/2016   SOB (shortness of breath) 02/23/2016   Pain medication agreement signed 01/13/2015   Acquired hypothyroidism 05/30/2014    Frontal sinusitis 12/17/2013   Atrial fibrillation ([place]) 11/29/2013   Major depression in remission ([place]) 11/20/2013   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, [place], [address] Phone: [phone]   Fax:  [phone]  Payne: CALIA NAPP MRN: [id number] Date of Birth: 1950/06/24

## 2020-08-14 ENCOUNTER — Encounter: Payer: Self-pay | Admitting: Physical Therapy

## 2020-08-14 ENCOUNTER — Ambulatory Visit: Payer: Medicare Other | Admitting: Physical Therapy

## 2020-08-14 DIAGNOSIS — M4126 Other idiopathic scoliosis, lumbar region: Secondary | ICD-10-CM

## 2020-08-14 DIAGNOSIS — M5416 Radiculopathy, lumbar region: Secondary | ICD-10-CM

## 2020-08-14 NOTE — Therapy (Signed)
[facility] [address], [place], [address] Phone: [phone]   Fax:  [phone]  Physical Therapy Treatment  Patient Details  Name: Colleen Payne MRN: [id number] Date of Birth: 04-Dec-1950 No data recorded  Encounter Date: 08/14/2020   PT End of Session - 08/14/20 1131     Visit Number 2    Number of Visits 17    Date for PT Re-Evaluation 11/01/20    Authorization Type Medicare    Authorization - Visit Number 2    Authorization - Number of Visits 10    PT Start Time 1115    PT Stop Time 1155    PT Time Calculation (min) 40 min    Equipment Utilized During Treatment Other (comment)    Activity Tolerance Patient tolerated treatment well;No increased pain    Behavior During Therapy WFL for tasks assessed/performed             Past Medical History:  Diagnosis Date   Anxiety    Atrial fibrillation ([place])    Depression    Diabetes mellitus without complication ([place])    Type I    Headache    stress. 1x/month   Hypertension    Insulin pump in place    Motion sickness    cars   MVP (mitral valve prolapse)    Neuromuscular disorder ([facility])    leg weakness s/p back surgeries   Thyroid disease     Past Surgical History:  Procedure Laterality Date   BACK SURGERY  2004   3 sugeries between Sept and Nov, fusion L2-S1   CATARACT EXTRACTION W/PHACO Left 11/23/2014   Procedure: CATARACT EXTRACTION PHACO AND INTRAOCULAR LENS PLACEMENT ([place]);  Surgeon: Leandrew Koyanagi, MD;  Location: [facility];  Service: Ophthalmology;  Laterality: Left;  DIABETIC - insulin pump    There were no vitals filed for this visit.   Subjective Assessment - 08/14/20 1125     Subjective Patinet reports she is completing HEP less than 50% of the day. She was having trouble with her blood sugar last night, her son helped her with this. Feels "normal" today. Reports 5/10 pain today.    Pertinent History Patinet  returns to PT following 3 month absence for re-evaluation of LBP. Is having pain straight across her low back that is consistent. Reports pain has been an 8/10 over the past month, does not get any better, can get up to 9/10. Pain is a consistent dull, ache. She reports her blood sugar has been better since she was here last, and that her son has been helping her with this. She has been living between her home and her son's d/t her memory issues since her husband died. She is basically living with her son full time, spends the night there, but does go to her house during the day. Her son has 3-4 steps to enter her his home through the garage with 1 handrail, but the front door has no handrail, and she cannot use those. She does not have to go upstairs at his house for her bedroom. She reports her son and wife are doing most of the cooking, she is completing her own cleaning, dressing, bathing (gets down in tub and gets out good with grab bar modI). No falls since last visit. Drives and completes community errands with SPC. She does have some memory issues that she reports are more short term or recent memories. ?Pt denies N/V, B&B changes,  unexplained weight fluctuation, saddle paresthesia, fever, night sweats, or unrelenting night pain at this time.    Limitations Standing;Walking;House hold activities;Other (comment)    How long can you sit comfortably? unlimited    How long can you stand comfortably? 1 hour    How long can you walk comfortably? with SPC 1 hour    Diagnostic tests see exray results: Fairly stable appearance of the levoscoliosis of the lower thoracic and lumbar spine. Stable lateral subluxation toward left of L2 with respect of L3, stable partial compression of the body of L1.    Patient Stated Goals pain relief, standing erect    Pain Onset More than a month ago    Pain Onset More than a month ago               Ther-Ex - Hooklying wand flexion x20 c PVC - Bridge x15 with min  cuing for full ext with good carry over - Prone prop 542mn; during insulin pump is beeping, reports BG 310 ceased momentarily - Prone press up x15; with BG following 302 - Prone alt supermans x12 with min encouragement needed BG 296 following first set; following second set 291 - Good Mornings PVC x12 with heavy TC needed for technique, decent carry over without BG 284 following - Full stand with back on wall x15 (scooting heels closer to wall at 10th rep) blocking at R knee provided - Backward walking 1.019m for 23m74m, cuing for posture and "large steps" with decent carry over. Pt able to maintain more thoracic ext with cuing to "keep eyes up"                         PT Education - 08/14/20 1129     Education Details therex form/technique    Person(s) Educated Patient    Methods Explanation;Demonstration;Verbal cues    Comprehension Verbalized understanding;Returned demonstration;Verbal cues required              PT Short Term Goals - 08/09/20 1005       PT SHORT TERM GOAL #1   Title Patient will be adherent to initial HEP at least 4x a week to improve functional strength and balance for better safety at home.    Baseline 08/09/20 HEP given    Time 4    Period Weeks    Status New      PT SHORT TERM GOAL #2   Title Pt will increase 10MWT by at least 0.13 m/s in order to demonstrate clinically significant improvement in community ambulation    Baseline 08/09/20 0.342m85m  Time 4    Period Weeks    Status New               PT Long Term Goals - 08/09/20 1010       PT LONG TERM GOAL #1   Title Pt will demonstrate 5xSTS of 12sec or less to demonstrate age-matched normalized BLE strength needed for heavy household ADLs    Baseline 08/11/20 14.3sec    Time 8    Period Weeks    Status New      PT LONG TERM GOAL #2   Title Pt will increase 10MWT by at least 1.42m/s642m order to demonstrate clinically significant improvement, and safety with independent  community ambulation    Baseline 08/11/20 0.342m/s68mTime 8    Period Weeks    Status New      PT LONG  TERM GOAL #3   Title Patient will report a worst pain of 5/10 with household ADLs to improve tolerance with ADLs    Baseline 08/09/20 8/10 with all tasks    Time 8    Period Weeks    Status New      PT LONG TERM GOAL #4   Title Patient will demonstrate 15 STS in 30sec to demonstrate age-matched increase in LE endurance    Baseline 08/11/20 11.5    Time 8    Period Weeks    Status New      PT LONG TERM GOAL #5   Title Patient will demonstrate SLS of 10sec to demonstrate decreased fall risk    Baseline 08/11/20 RLE: 2sec LLE: 3sec    Time 8    Period Weeks    Status New                   Plan - 08/14/20 1153     Clinical Impression Statement PT initiated therex progression for increased extension posture with success. PT monitored blood sugar throughout session, which continued lowering with exercise. Patient is able to comply with all cuing for proper technique of therex with good motivation throughout session. Reports decreased pain this session allowing for progression. PT will continue progression as able.    Personal Factors and Comorbidities Age;Behavior Pattern;Fitness;Past/Current Experience;Comorbidity 1;Comorbidity 2;Comorbidity 3+    Comorbidities scolosis, DM1, chronic pain syndrome, HTN, afib, depression    Examination-Activity Limitations Bathing;Dressing;Transfers;Caring for Others;Carry;Squat;Lift;Bend;Bed Mobility    Examination-Participation Restrictions Yard Work;Laundry;Cleaning;Community Activity    Stability/Clinical Decision Making Evolving/Moderate complexity    Clinical Decision Making Moderate    Rehab Potential Fair    Clinical Impairments Affecting Rehab Potential comorbidities, symptom duration, curvature degree    PT Frequency 2x / week    PT Duration 2 weeks    PT Treatment/Interventions ADLs/Self Care Home Management;Aquatic  Therapy;Cryotherapy;Ultrasound;Parrafin;Traction;Moist Heat;Electrical Stimulation;DME Instruction;Gait training;Stair training;Functional mobility training;Neuromuscular re-education;Balance training;Therapeutic exercise;Therapeutic activities;Patient/family education;Energy conservation;Spinal Manipulations;Joint Manipulations;Passive range of motion;Dry needling;Manual techniques    PT Next Visit Plan continue POC    PT Home Exercise Plan Added standing pec stretch in doorway to HEP today    Consulted and Agree with Plan of Care Patient             Patient will benefit from skilled therapeutic intervention in order to improve the following deficits and impairments:  Abnormal gait, Decreased balance, Decreased endurance, Decreased mobility, Difficulty walking, Hypomobility, Increased muscle spasms, Decreased range of motion, Pain, Postural dysfunction, Impaired flexibility, Increased fascial restricitons, Decreased strength, Decreased activity tolerance  Visit Diagnosis: Other idiopathic scoliosis, lumbar region  Radiculopathy, lumbar region     Problem List Patient Active Problem List   Diagnosis Date Noted   DKA (diabetic ketoacidosis) ([place]) 05/04/2020   Central sleep apnea 10/09/2016   Hypertension 10/09/2016   Lumbar postlaminectomy syndrome 10/09/2016   Lumbar radiculopathy 10/09/2016   Diabetic polyneuropathy ([place]) 10/07/2016   Right lumbosacral radiculopathy 10/07/2016   Healthcare maintenance 08/26/2016   DKA (diabetic ketoacidoses) 06/14/2016   Atypical chest pain 02/23/2016   SOB (shortness of breath) 02/23/2016   Pain medication agreement signed 01/13/2015   Acquired hypothyroidism 05/30/2014   Frontal sinusitis 12/17/2013   Atrial fibrillation ([place]) 11/29/2013   Major depression in remission ([place]) 11/20/2013   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) 10/26/2013   Chronic pain syndrome 08/27/2013   Back pain 07/17/2013   HTN  (hypertension), benign 07/17/2013   Hyperlipidemia 07/17/2013  Pancreatic insufficiency 07/17/2013   Chronic postoperative pain 01/01/2013   Long term current use of opiate analgesic 11/04/2012   Hypothyroidism 08/25/2012   Radiculopathy of leg 05/01/2011   Durwin Reges DP Mi Balla Terance Hart 08/14/2020, 12:12 PM   [facility] [address], [place], [address] Phone: [phone]   Fax:  [phone]  Name: Colleen Payne MRN: [id number] Date of Birth: [birth year]

## 2020-08-16 ENCOUNTER — Encounter: Payer: Self-pay | Admitting: Physical Therapy

## 2020-08-16 ENCOUNTER — Ambulatory Visit: Payer: Medicare Other | Admitting: Physical Therapy

## 2020-08-16 DIAGNOSIS — M4126 Other idiopathic scoliosis, lumbar region: Secondary | ICD-10-CM | POA: Diagnosis not present

## 2020-08-16 DIAGNOSIS — M5416 Radiculopathy, lumbar region: Secondary | ICD-10-CM

## 2020-08-16 NOTE — Therapy (Signed)
[facility] [address], [place], [address] Phone: [phone]   Fax:  [phone]  Physical Therapy Treatment  Patient Details  Name: Colleen Payne MRN: [id number] Date of Birth: [birth year] No data recorded  Encounter Date: 08/16/2020   PT End of Session - 08/16/20 0935     Visit Number 3    Number of Visits 17    Date for PT Re-Evaluation 11/01/20    Authorization - Visit Number 3    Authorization - Number of Visits 10    PT Start Time 0900    PT Stop Time 0940    PT Time Calculation (min) 40 min    Equipment Utilized During Treatment Other (comment)    Activity Tolerance Patient tolerated treatment well;No increased pain    Behavior During Therapy WFL for tasks assessed/performed             Past Medical History:  Diagnosis Date   Anxiety    Atrial fibrillation ([place])    Depression    Diabetes mellitus without complication ([name])    Type I    Headache    stress. 1x/month   Hypertension    Insulin pump in place    Motion sickness    cars   MVP (mitral valve prolapse)    Neuromuscular disorder ([facility])    leg weakness s/p back surgeries   Thyroid disease     Past Surgical History:  Procedure Laterality Date   BACK SURGERY  2004   3 sugeries between Sept and Nov, fusion L2-S1   CATARACT EXTRACTION W/PHACO Left 11/23/2014   Procedure: CATARACT EXTRACTION PHACO AND INTRAOCULAR LENS PLACEMENT ([place]);  Surgeon: Leandrew Koyanagi, MD;  Location: [facility];  Service: Ophthalmology;  Laterality: Left;  DIABETIC - insulin pump    There were no vitals filed for this visit.   Subjective Assessment - 08/16/20 0910     Subjective Pt reports she was very sore following last visit. Reports she hasn't moved much d/t this. She has not been able to check her blood sugar d/t her sensor being down. Pain today 7/10    Pertinent History Patinet returns to PT following 3 month absence for  re-evaluation of LBP. Is having pain straight across her low back that is consistent. Reports pain has been an 8/10 over the past month, does not get any better, can get up to 9/10. Pain is a consistent dull, ache. She reports her blood sugar has been better since she was here last, and that her son has been helping her with this. She has been living between her home and her son's d/t her memory issues since her husband died. She is basically living with her son full time, spends the night there, but does go to her house during the day. Her son has 3-4 steps to enter her his home through the garage with 1 handrail, but the front door has no handrail, and she cannot use those. She does not have to go upstairs at his house for her bedroom. She reports her son and wife are doing most of the cooking, she is completing her own cleaning, dressing, bathing (gets down in tub and gets out good with grab bar modI). No falls since last visit. Drives and completes community errands with S[employer]. She does have some memory issues that she reports are more short term or recent memories. ?Pt denies N/V, B&B changes, unexplained weight fluctuation, saddle paresthesia, fever, night  sweats, or unrelenting night pain at this time.    Limitations Standing;Walking;House hold activities;Other (comment)    How long can you sit comfortably? unlimited    How long can you stand comfortably? 1 hour    How long can you walk comfortably? with SPC 1 hour    Diagnostic tests see exray results: Fairly stable appearance of the levoscoliosis of the lower thoracic and lumbar spine. Stable lateral subluxation toward left of L2 with respect of L3, stable partial compression of the body of L1.    Patient Stated Goals pain relief, standing erect    Pain Onset More than a month ago    Pain Onset More than a month ago             Ther-Ex - Hooklying wand flexion x20 c PVC - Hooklying lumbar rotations x20 - Hooklying STKC x30sec; with CLLE ext  x30sec each  - Articulating bridge x12 with min cuing for full ext with good carry over - Prone prop 41mn; Prone press up x15; 163m prone prop following - Prone alt supermans x12 with min encouragement needed  - fwd <> bwd leg swings x15 each  - Backward walking 1.23m72mfor 3mi46m cuing for posture and "large steps" with decent carry over. Pt able to maintain more thoracic ext with cuing to "keep eyes up"                          PT Education - 08/16/20 0934     Education Details therex form/technique    Person(s) Educated Patient    Methods Explanation;Demonstration;Verbal cues    Comprehension Verbalized understanding;Returned demonstration;Verbal cues required              PT Short Term Goals - 08/09/20 1005       PT SHORT TERM GOAL #1   Title Patient will be adherent to initial HEP at least 4x a week to improve functional strength and balance for better safety at home.    Baseline 08/09/20 HEP given    Time 4    Period Weeks    Status New      PT SHORT TERM GOAL #2   Title Pt will increase 10MWT by at least 0.13 m/s in order to demonstrate clinically significant improvement in community ambulation    Baseline 08/09/20 0.62m/1m Time 4    Period Weeks    Status New               PT Long Term Goals - 08/09/20 1010       PT LONG TERM GOAL #1   Title Pt will demonstrate 5xSTS of 12sec or less to demonstrate age-matched normalized BLE strength needed for heavy household ADLs    Baseline 08/11/20 14.3sec    Time 8    Period Weeks    Status New      PT LONG TERM GOAL #2   Title Pt will increase 10MWT by at least 1.23m/s 33morder to demonstrate clinically significant improvement, and safety with independent community ambulation    Baseline 08/11/20 0.62m/s 19mime 8    Period Weeks    Status New      PT LONG TERM GOAL #3   Title Patient will report a worst pain of 5/10 with household ADLs to improve tolerance with ADLs    Baseline 08/09/20  8/10 with all tasks    Time 8    Period Weeks  Status New      PT LONG TERM GOAL #4   Title Patient will demonstrate 15 STS in 30sec to demonstrate age-matched increase in LE endurance    Baseline 08/11/20 11.5    Time 8    Period Weeks    Status New      PT LONG TERM GOAL #5   Title Patient will demonstrate SLS of 10sec to demonstrate decreased fall risk    Baseline 08/11/20 RLE: 2sec LLE: 3sec    Time 8    Period Weeks    Status New                   Plan - 08/16/20 0940     Clinical Impression Statement PT continued therex progression with increased focus on mobility and pain management d/t increased pain and post exercise soreness with success. Patient is able to comply with all cuing for proper technique of therex with good motivation throughout session. Patient reports decreased pain at end of session to 6/10. PT will continue progression as able.    Personal Factors and Comorbidities Age;Behavior Pattern;Fitness;Past/Current Experience;Comorbidity 1;Comorbidity 2;Comorbidity 3+    Comorbidities scolosis, DM1, chronic pain syndrome, HTN, afib, depression    Examination-Activity Limitations Bathing;Dressing;Transfers;Caring for Others;Carry;Squat;Lift;Bend;Bed Mobility    Examination-Participation Restrictions Yard Work;Laundry;Cleaning;Community Activity    Stability/Clinical Decision Making Evolving/Moderate complexity    Clinical Decision Making Moderate    Rehab Potential Fair    Clinical Impairments Affecting Rehab Potential comorbidities, symptom duration, curvature degree    PT Frequency 2x / week    PT Duration 2 weeks    PT Treatment/Interventions ADLs/Self Care Home Management;Aquatic Therapy;Cryotherapy;Ultrasound;Parrafin;Traction;Moist Heat;Electrical Stimulation;DME Instruction;Gait training;Stair training;Functional mobility training;Neuromuscular re-education;Balance training;Therapeutic exercise;Therapeutic activities;Patient/family education;Energy  conservation;Spinal Manipulations;Joint Manipulations;Passive range of motion;Dry needling;Manual techniques    PT Next Visit Plan continue POC    PT Home Exercise Plan Added standing pec stretch in doorway to HEP today    Consulted and Agree with Plan of Care Patient             Patient will benefit from skilled therapeutic intervention in order to improve the following deficits and impairments:  Abnormal gait, Decreased balance, Decreased endurance, Decreased mobility, Difficulty walking, Hypomobility, Increased muscle spasms, Decreased range of motion, Pain, Postural dysfunction, Impaired flexibility, Increased fascial restricitons, Decreased strength, Decreased activity tolerance  Visit Diagnosis: Other idiopathic scoliosis, lumbar region  Radiculopathy, lumbar region     Problem List Patient Active Problem List   Diagnosis Date Noted   DKA (diabetic ketoacidosis) ([place]) 05/04/2020   Central sleep apnea 10/09/2016   Hypertension 10/09/2016   Lumbar postlaminectomy syndrome 10/09/2016   Lumbar radiculopathy 10/09/2016   Diabetic polyneuropathy ([place]) 10/07/2016   Right lumbosacral radiculopathy 10/07/2016   Healthcare maintenance 08/26/2016   DKA (diabetic ketoacidoses) 06/14/2016   Atypical chest pain 02/23/2016   SOB (shortness of breath) 02/23/2016   Pain medication agreement signed 01/13/2015   Acquired hypothyroidism 05/30/2014   Frontal sinusitis 12/17/2013   Atrial fibrillation ([place]) 11/29/2013   Major depression in remission ([place]) 11/20/2013   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 [place], [address] Phone: [phone]   Fax:  [phone]  Name: SOSIE LADE MRN: [id number] Date of Birth: [birth year]

## 2020-08-21 ENCOUNTER — Encounter: Payer: Self-pay | Admitting: Physical Therapy

## 2020-08-21 ENCOUNTER — Ambulatory Visit: Payer: Medicare Other | Attending: Anesthesiology | Admitting: Physical Therapy

## 2020-08-21 DIAGNOSIS — M4126 Other idiopathic scoliosis, lumbar region: Secondary | ICD-10-CM | POA: Diagnosis present

## 2020-08-21 DIAGNOSIS — M5416 Radiculopathy, lumbar region: Secondary | ICD-10-CM | POA: Diagnosis present

## 2020-08-21 NOTE — Therapy (Signed)
[facility] [address] [place], [address] Phone: [phone]   Fax:  [phone]  Physical Therapy Treatment  Patient Details  Name: Colleen Payne MRN: [id number] Date of Birth: 01-08-1951 No data recorded  Encounter Date: 08/21/2020   PT End of Session - 08/21/20 1453     Visit Number 4    Number of Visits 17    Date for PT Re-Evaluation 11/01/20    Authorization Type Medicare    Authorization Time Period 10/10    Authorization - Visit Number 4    Authorization - Number of Visits 10    PT Start Time [id number]    PT Stop Time 1510    PT Time Calculation (min) 32 min    Equipment Utilized During Treatment Other (comment)    Activity Tolerance Patient tolerated treatment well;No increased pain    Behavior During Therapy WFL for tasks assessed/performed             Past Medical History:  Diagnosis Date   Anxiety    Atrial fibrillation ([facility])    Depression    Diabetes mellitus without complication ([place])    Type I    Headache    stress. 1x/month   Hypertension    Insulin pump in place    Motion sickness    cars   MVP (mitral valve prolapse)    Neuromuscular disorder ([facility])    leg weakness s/p back surgeries   Thyroid disease     Past Surgical History:  Procedure Laterality Date   BACK SURGERY  2004   3 sugeries between Sept and Nov, fusion L2-S1   CATARACT EXTRACTION W/PHACO Left 11/23/2014   Procedure: CATARACT EXTRACTION PHACO AND INTRAOCULAR LENS PLACEMENT ([facility]);  Surgeon: Leandrew Koyanagi, MD;  Location: [facility];  Service: Ophthalmology;  Laterality: Left;  DIABETIC - insulin pump    There were no vitals filed for this visit.   Subjective Assessment - 08/21/20 1444     Subjective Patinet reports she was rushing to get to her appt today, she has been very busy. She has been doing her HEP more, thinks it is helping her be more mobile. Notes 5/10 pain today.    Pertinent  History Patinet returns to PT following 3 month absence for re-evaluation of LBP. Is having pain straight across her low back that is consistent. Reports pain has been an 8/10 over the past month, does not get any better, can get up to 9/10. Pain is a consistent dull, ache. She reports her blood sugar has been better since she was here last, and that her son has been helping her with this. She has been living between her home and her son's d/t her memory issues since her husband died. She is basically living with her son full time, spends the night there, but does go to her house during the day. Her son has 3-4 steps to enter her his home through the garage with 1 handrail, but the front door has no handrail, and she cannot use those. She does not have to go upstairs at his house for her bedroom. She reports her son and wife are doing most of the cooking, she is completing her own cleaning, dressing, bathing (gets down in tub and gets out good with grab bar modI). No falls since last visit. Drives and completes community errands with SPC. She does have some memory issues that she reports are more short term or  recent memories. ?Pt denies N/V, B&B changes, unexplained weight fluctuation, saddle paresthesia, fever, night sweats, or unrelenting night pain at this time.    Limitations Standing;Walking;House hold activities;Other (comment)    How long can you sit comfortably? unlimited    How long can you stand comfortably? 1 hour    How long can you walk comfortably? with SPC 1 hour    Diagnostic tests see exray results: Fairly stable appearance of the levoscoliosis of the lower thoracic and lumbar spine. Stable lateral subluxation toward left of L2 with respect of L3, stable partial compression of the body of L1.    Patient Stated Goals pain relief, standing erect    Pain Onset More than a month ago    Pain Onset More than a month ago              Ther-Ex BG high prior to exercise 336 - Hooklying  wand flexion x20 c PVC - Bridge x15 with min cuing for full ext with good carry over - Prone prop 816mn; Prone press up x15; with BG following 330 - Prone alt supermans x12 with min encouragement needed  - Birddog 2x 12 with min TC for safety; childs pose 30sec after each set  BG following 355; pt gives self insulin  - STS 2x 10 with cuing for max upright posture with stand; BG 345 following first set; 365 following second   Session ceased d/t BG levels                         PT Education - 08/21/20 1452     Education Details therex form/technique    Person(s) Educated Patient    Methods Explanation;Demonstration;Verbal cues    Comprehension Verbalized understanding;Returned demonstration;Verbal cues required              PT Short Term Goals - 08/09/20 1005       PT SHORT TERM GOAL #1   Title Patient will be adherent to initial HEP at least 4x a week to improve functional strength and balance for better safety at home.    Baseline 08/09/20 HEP given    Time 4    Period Weeks    Status New      PT SHORT TERM GOAL #2   Title Pt will increase 10MWT by at least 0.13 m/s in order to demonstrate clinically significant improvement in community ambulation    Baseline 08/09/20 0.630m    Time 4    Period Weeks    Status New               PT Long Term Goals - 08/09/20 1010       PT LONG TERM GOAL #1   Title Pt will demonstrate 5xSTS of 12sec or less to demonstrate age-matched normalized BLE strength needed for heavy household ADLs    Baseline 08/11/20 14.3sec    Time 8    Period Weeks    Status New      PT LONG TERM GOAL #2   Title Pt will increase 10MWT by at least 1.16m58min order to demonstrate clinically significant improvement, and safety with independent community ambulation    Baseline 08/11/20 0.30m59m  Time 8    Period Weeks    Status New      PT LONG TERM GOAL #3   Title Patient will report a worst pain of 5/10 with household ADLs to  improve tolerance with ADLs  Baseline 08/09/20 8/10 with all tasks    Time 8    Period Weeks    Status New      PT LONG TERM GOAL #4   Title Patient will demonstrate 15 STS in 30sec to demonstrate age-matched increase in LE endurance    Baseline 08/11/20 11.5    Time 8    Period Weeks    Status New      PT LONG TERM GOAL #5   Title Patient will demonstrate SLS of 10sec to demonstrate decreased fall risk    Baseline 08/11/20 RLE: 2sec LLE: 3sec    Time 8    Period Weeks    Status New                   Plan - 08/21/20 1517     Clinical Impression Statement PT session shorted d/t blood glucose monitoring. Patient with blood glucose that is consistently high. In the past blood glucose has decreased to exercise safe levels with increased movement, but this session values maintain well above normal. Pt is able to comply with cuing for technique of low level exercise with good motivation throughout session. PT will continue progression as able.    Personal Factors and Comorbidities Age;Behavior Pattern;Fitness;Past/Current Experience;Comorbidity 1;Comorbidity 2;Comorbidity 3+    Comorbidities scolosis, DM1, chronic pain syndrome, HTN, afib, depression    Examination-Activity Limitations Bathing;Dressing;Transfers;Caring for Others;Carry;Squat;Lift;Bend;Bed Mobility    Examination-Participation Restrictions Yard Work;Laundry;Cleaning;Community Activity    Stability/Clinical Decision Making Evolving/Moderate complexity    Clinical Decision Making Moderate    Rehab Potential Fair    Clinical Impairments Affecting Rehab Potential comorbidities, symptom duration, curvature degree    PT Frequency 2x / week    PT Duration 2 weeks    PT Treatment/Interventions ADLs/Self Care Home Management;Aquatic Therapy;Cryotherapy;Ultrasound;Parrafin;Traction;Moist Heat;Electrical Stimulation;DME Instruction;Gait training;Stair training;Functional mobility training;Neuromuscular re-education;Balance  training;Therapeutic exercise;Therapeutic activities;Patient/family education;Energy conservation;Spinal Manipulations;Joint Manipulations;Passive range of motion;Dry needling;Manual techniques    PT Next Visit Plan continue POC    PT Home Exercise Plan Added standing pec stretch in doorway to HEP today    Consulted and Agree with Plan of Care Patient             Patient will benefit from skilled therapeutic intervention in order to improve the following deficits and impairments:  Abnormal gait, Decreased balance, Decreased endurance, Decreased mobility, Difficulty walking, Hypomobility, Increased muscle spasms, Decreased range of motion, Pain, Postural dysfunction, Impaired flexibility, Increased fascial restricitons, Decreased strength, Decreased activity tolerance  Visit Diagnosis: Other idiopathic scoliosis, lumbar region  Radiculopathy, lumbar region     Problem List Patient Active Problem List   Diagnosis Date Noted   DKA (diabetic ketoacidosis) ([facility]) 05/04/2020   Central sleep apnea 10/09/2016   Hypertension 10/09/2016   Lumbar postlaminectomy syndrome 10/09/2016   Lumbar radiculopathy 10/09/2016   Diabetic polyneuropathy ([facility]) 10/07/2016   Right lumbosacral radiculopathy 10/07/2016   Healthcare maintenance 08/26/2016   DKA (diabetic ketoacidoses) 06/14/2016   Atypical chest pain 02/23/2016   SOB (shortness of breath) 02/23/2016   Pain medication agreement signed 01/13/2015   Acquired hypothyroidism 05/30/2014   Frontal sinusitis 12/17/2013   Atrial fibrillation ([facility]) 11/29/2013   Major depression in remission ([facility]) 11/20/2013   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 [place], [address] Phone: [phone]   Fax:  [phone]  Name: PATRICIANN EHRENFELD MRN: [id number] Date of Birth: 1950-02-06

## 2020-08-24 ENCOUNTER — Encounter: Payer: Self-pay | Admitting: Physical Therapy

## 2020-08-24 ENCOUNTER — Ambulatory Visit: Payer: Medicare Other | Admitting: Physical Therapy

## 2020-08-24 DIAGNOSIS — M4126 Other idiopathic scoliosis, lumbar region: Secondary | ICD-10-CM | POA: Diagnosis not present

## 2020-08-24 DIAGNOSIS — M5416 Radiculopathy, lumbar region: Secondary | ICD-10-CM

## 2020-08-24 NOTE — Therapy (Signed)
[facility] [address] [place], [address] Phone: [phone]   Fax:  [phone]  Physical Therapy Treatment  Patient Details  Name: Colleen Payne MRN: [id number] Date of Birth: July 28, 1950 No data recorded  Encounter Date: 08/24/2020   PT End of Session - 08/24/20 0957     Visit Number 5    Number of Visits 17    Date for PT Re-Evaluation 11/01/20    Authorization Type Medicare    Authorization Time Period 10/10    Authorization - Visit Number 5    Authorization - Number of Visits 10    PT Start Time 0945    PT Stop Time 1005    PT Time Calculation (min) 20 min    Activity Tolerance Patient tolerated treatment well;No increased pain    Behavior During Therapy WFL for tasks assessed/performed             Past Medical History:  Diagnosis Date   Anxiety    Atrial fibrillation ([place])    Depression    Diabetes mellitus without complication ([place])    Type I    Headache    stress. 1x/month   Hypertension    Insulin pump in place    Motion sickness    cars   MVP (mitral valve prolapse)    Neuromuscular disorder (H[facility])    leg weakness s/p back surgeries   Thyroid disease     Past Surgical History:  Procedure Laterality Date   BACK SURGERY  2004   3 sugeries between Sept and Nov, fusion L2-S1   CATARACT EXTRACTION W/PHACO Left 11/23/2014   Procedure: CATARACT EXTRACTION PHACO AND INTRAOCULAR LENS PLACEMENT ([place]);  Surgeon: Leandrew Koyanagi, MD;  Location: [facility];  Service: Ophthalmology;  Laterality: Left;  DIABETIC - insulin pump    There were no vitals filed for this visit.   Subjective Assessment - 08/24/20 0950     Subjective Pt reports her blood sugar was high when she ate breakfast this am at 7am. She gave herself 3 units of insulin and is assuming her BS is lowered now. PT checked, reads "HI" on monitor    Pertinent History Patinet returns to PT following 3 month  absence for re-evaluation of LBP. Is having pain straight across her low back that is consistent. Reports pain has been an 8/10 over the past month, does not get any better, can get up to 9/10. Pain is a consistent dull, ache. She reports her blood sugar has been better since she was here last, and that her son has been helping her with this. She has been living between her home and her son's d/t her memory issues since her husband died. She is basically living with her son full time, spends the night there, but does go to her house during the day. Her son has 3-4 steps to enter her his home through the garage with 1 handrail, but the front door has no handrail, and she cannot use those. She does not have to go upstairs at his house for her bedroom. She reports her son and wife are doing most of the cooking, she is completing her own cleaning, dressing, bathing (gets down in tub and gets out good with grab bar modI). No falls since last visit. Drives and completes community errands with SPC. She does have some memory issues that she reports are more short term or recent memories. ?Pt denies N/V, B&B changes, unexplained  weight fluctuation, saddle paresthesia, fever, night sweats, or unrelenting night pain at this time.    Limitations Standing;Walking;House hold activities;Other (comment)    How long can you sit comfortably? unlimited    How long can you stand comfortably? 1 hour    How long can you walk comfortably? with SPC 1 hour    Diagnostic tests see exray results: Fairly stable appearance of the levoscoliosis of the lower thoracic and lumbar spine. Stable lateral subluxation toward left of L2 with respect of L3, stable partial compression of the body of L1.    Patient Stated Goals pain relief, standing erect    Pain Onset More than a month ago    Pain Onset More than a month ago             Ther-Ex - Hooklying wand flexion x20 c PVC Blood sugar remains high following PT instructed pt to walk  2 laps around the gym (230f) then re-checked sugar still off the chart >500 PT gave patient water, patient reports she does not drink water, PT educated patient on the importance of hydration for reducing blood sugar, and for organ health. PT educated patient on regular exercise and nutrition for blood sugar management. Pt insists BS is high d/t stress, PT encouraged patient to reach out to PCP for stress management help (medication, psychology, etc.)                          PT Education - 08/24/20 0952     Education Details therex form/technique    Person(s) Educated Patient    Methods Explanation;Demonstration;Verbal cues    Comprehension Verbalized understanding;Returned demonstration;Verbal cues required              PT Short Term Goals - 08/09/20 1005       PT SHORT TERM GOAL #1   Title Patient will be adherent to initial HEP at least 4x a week to improve functional strength and balance for better safety at home.    Baseline 08/09/20 HEP given    Time 4    Period Weeks    Status New      PT SHORT TERM GOAL #2   Title Pt will increase 10MWT by at least 0.13 m/s in order to demonstrate clinically significant improvement in community ambulation    Baseline 08/09/20 0.673m    Time 4    Period Weeks    Status New               PT Long Term Goals - 08/09/20 1010       PT LONG TERM GOAL #1   Title Pt will demonstrate 5xSTS of 12sec or less to demonstrate age-matched normalized BLE strength needed for heavy household ADLs    Baseline 08/11/20 14.3sec    Time 8    Period Weeks    Status New      PT LONG TERM GOAL #2   Title Pt will increase 10MWT by at least 1.73m18min order to demonstrate clinically significant improvement, and safety with independent community ambulation    Baseline 08/11/20 0.63m27m  Time 8    Period Weeks    Status New      PT LONG TERM GOAL #3   Title Patient will report a worst pain of 5/10 with household ADLs to  improve tolerance with ADLs    Baseline 08/09/20 8/10 with all tasks    Time 8  Period Weeks    Status New      PT LONG TERM GOAL #4   Title Patient will demonstrate 15 STS in 30sec to demonstrate age-matched increase in LE endurance    Baseline 08/11/20 11.5    Time 8    Period Weeks    Status New      PT LONG TERM GOAL #5   Title Patient will demonstrate SLS of 10sec to demonstrate decreased fall risk    Baseline 08/11/20 RLE: 2sec LLE: 3sec    Time 8    Period Weeks    Status New                   Plan - 08/24/20 1011     Clinical Impression Statement Session concluded d/t pt blood sugar over 500 for duration of session. According to blood sugar tracking patient blood sugar has been off chart all morning. Pt reports that this always happens when she eats, and it will come down with activity. PT explained that with it being unchartable for over 3 hours, this is not in the parameters of PT guidelines. PT gave several suggestions of drinking more water, being seen by a hospitalist, and reaching out to PCP for support for stress management (pt reports stress is the root of her blood sugar being high). Pt is resistant to most suggestions, and PT maintained that sessions cannot progress with current levels. PT will continue progression as able.    Personal Factors and Comorbidities Age;Behavior Pattern;Fitness;Past/Current Experience;Comorbidity 1;Comorbidity 2;Comorbidity 3+    Comorbidities scolosis, DM1, chronic pain syndrome, HTN, afib, depression    Examination-Activity Limitations Bathing;Dressing;Transfers;Caring for Others;Carry;Squat;Lift;Bend;Bed Mobility    Examination-Participation Restrictions Yard Work;Laundry;Cleaning;Community Activity    Stability/Clinical Decision Making Evolving/Moderate complexity    Clinical Decision Making Moderate    Rehab Potential Fair    Clinical Impairments Affecting Rehab Potential comorbidities, symptom duration, curvature degree     PT Frequency 2x / week    PT Duration 2 weeks    PT Treatment/Interventions ADLs/Self Care Home Management;Aquatic Therapy;Cryotherapy;Ultrasound;Parrafin;Traction;Moist Heat;Electrical Stimulation;DME Instruction;Gait training;Stair training;Functional mobility training;Neuromuscular re-education;Balance training;Therapeutic exercise;Therapeutic activities;Patient/family education;Energy conservation;Spinal Manipulations;Joint Manipulations;Passive range of motion;Dry needling;Manual techniques    PT Next Visit Plan continue POC    PT Home Exercise Plan Added standing pec stretch in doorway to HEP today    Consulted and Agree with Plan of Care Patient             Patient will benefit from skilled therapeutic intervention in order to improve the following deficits and impairments:  Abnormal gait, Decreased balance, Decreased endurance, Decreased mobility, Difficulty walking, Hypomobility, Increased muscle spasms, Decreased range of motion, Pain, Postural dysfunction, Impaired flexibility, Increased fascial restricitons, Decreased strength, Decreased activity tolerance  Visit Diagnosis: Other idiopathic scoliosis, lumbar region  Radiculopathy, lumbar region     Problem List Patient Active Problem List   Diagnosis Date Noted   DKA (diabetic ketoacidosis) ([place]) 05/04/2020   Central sleep apnea 10/09/2016   Hypertension 10/09/2016   Lumbar postlaminectomy syndrome 10/09/2016   Lumbar radiculopathy 10/09/2016   Diabetic polyneuropathy ([place]) 10/07/2016   Right lumbosacral radiculopathy 10/07/2016   Healthcare maintenance 08/26/2016   DKA (diabetic ketoacidoses) 06/14/2016   Atypical chest pain 02/23/2016   SOB (shortness of breath) 02/23/2016   Pain medication agreement signed 01/13/2015   Acquired hypothyroidism 05/30/2014   Frontal sinusitis 12/17/2013   Atrial fibrillation ([place]) 11/29/2013   Major depression in remission ([place]) 11/20/2013   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 [place], [address] Phone: [phone]   Fax:  [phone]  Name: TERRIAN LIBBEY MRN: [id number] Date of Birth: 1950-11-24

## 2020-08-30 ENCOUNTER — Ambulatory Visit: Payer: Medicare Other | Admitting: Physical Therapy

## 2020-08-31 ENCOUNTER — Telehealth: Payer: Self-pay | Admitting: Physical Therapy

## 2020-08-31 NOTE — Telephone Encounter (Signed)
Pt arrived at clinic during lunch to cancel all appts. When called PT back later states that she did not think it was right for PT to cancel previous session d/t blood sugar levels. PT stated in multiple ways that blood sugar >500 for >49mns is a medical issue. Pt inquires of why PT is taking an interest in her blood sugar since her return to PT, PT educates pt that it is due to signs and symptoms of blood sugar issues (hyperglycemia), ie: confusion (getting lost coming to the clinic, coming to the clinic at the wrong time such as today), having sores on her face that have trouble healing, being to the emergency room multiple times d/t blood sugar issues, being found unconscious by family d/t hyperglycemia. Patient is not accepting of this, reporting she is confused because her husband passed. PT encouraged patient that the loss of her husband is exactly why medical professionals should be looking after her more closely to ensure she is taking care of herself. Pt then states she felt embarrassed discussing her blood sugar in the PT gym, PT replied that I was sorry if she felt that way but there were not other patients in the gym, only fellow providers, and she has not expressed these concerns in the gym before so PT did not think about it. 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   Last week 8/3 pt daughter called and expressed disdain for previous session ceasing, and PT explained she could not discuss patient care due to HIPPA, but in general, there are guidelines for care with all vitals (BP, O2, HR, blood sugar) that medically, and ethically must be followed.

## 2020-09-01 ENCOUNTER — Ambulatory Visit: Payer: Medicare Other | Admitting: Physical Therapy

## 2020-09-06 ENCOUNTER — Encounter: Payer: Medicare Other | Admitting: Physical Therapy

## 2020-09-08 ENCOUNTER — Encounter: Payer: Medicare Other | Admitting: Physical Therapy

## 2020-09-12 ENCOUNTER — Encounter: Payer: Medicare Other | Admitting: Physical Therapy

## 2020-09-15 ENCOUNTER — Encounter: Payer: Medicare Other | Admitting: Physical Therapy

## 2020-09-20 ENCOUNTER — Encounter: Payer: Medicare Other | Admitting: Physical Therapy

## 2020-09-22 ENCOUNTER — Encounter: Payer: Medicare Other | Admitting: Physical Therapy

## 2021-02-07 ENCOUNTER — Emergency Department
Admission: EM | Admit: 2021-02-07 | Discharge: 2021-02-07 | Disposition: A | Discharge status: Home / Self Care | Payer: Medicare Other | Attending: Emergency Medicine | Admitting: Emergency Medicine

## 2021-02-07 ENCOUNTER — Emergency Department: Payer: Medicare Other

## 2021-02-07 ENCOUNTER — Other Ambulatory Visit: Payer: Self-pay

## 2021-02-07 DIAGNOSIS — I4891 Unspecified atrial fibrillation: Secondary | ICD-10-CM | POA: Diagnosis not present

## 2021-02-07 DIAGNOSIS — R739 Hyperglycemia, unspecified: Secondary | ICD-10-CM | POA: Diagnosis present

## 2021-02-07 DIAGNOSIS — E1065 Type 1 diabetes mellitus with hyperglycemia: Secondary | ICD-10-CM | POA: Insufficient documentation

## 2021-02-07 DIAGNOSIS — E1010 Type 1 diabetes mellitus with ketoacidosis without coma: Secondary | ICD-10-CM | POA: Diagnosis not present

## 2021-02-07 DIAGNOSIS — Z794 Long term (current) use of insulin: Secondary | ICD-10-CM | POA: Insufficient documentation

## 2021-02-07 LAB — BASIC METABOLIC PANEL
Anion gap: 19 — ABNORMAL HIGH (ref 5–15)
Anion gap: 10 (ref 5–15)
BUN: 50 mg/dL — ABNORMAL HIGH (ref 8–23)
BUN: 45 mg/dL — ABNORMAL HIGH (ref 8–23)
CO2: 20 mmol/L — ABNORMAL LOW (ref 22–32)
CO2: 24 mmol/L (ref 22–32)
Calcium: 9.3 mg/dL (ref 8.9–10.3)
Calcium: 8.5 mg/dL — ABNORMAL LOW (ref 8.9–10.3)
Chloride: 90 mmol/L — ABNORMAL LOW (ref 98–111)
Chloride: 100 mmol/L (ref 98–111)
Creatinine, Ser: 1.39 mg/dL — ABNORMAL HIGH (ref 0.44–1.00)
Creatinine, Ser: 1.1 mg/dL — ABNORMAL HIGH (ref 0.44–1.00)
GFR, Estimated: 41 mL/min — ABNORMAL LOW (ref >60)
GFR, Estimated: 54 mL/min — ABNORMAL LOW (ref >60)
Glucose, Bld: 437 mg/dL — ABNORMAL HIGH (ref 70–99)
Glucose, Bld: 267 mg/dL — ABNORMAL HIGH (ref 70–99)
Potassium: 3.4 mmol/L — ABNORMAL LOW (ref 3.5–5.1)
Potassium: 3.3 mmol/L — ABNORMAL LOW (ref 3.5–5.1)
Sodium: 129 mmol/L — ABNORMAL LOW (ref 135–145)
Sodium: 134 mmol/L — ABNORMAL LOW (ref 135–145)

## 2021-02-07 LAB — CBG MONITORING, ED
Glucose-Capillary: 450 mg/dL — ABNORMAL HIGH (ref 70–99)
Glucose-Capillary: 354 mg/dL — ABNORMAL HIGH (ref 70–99)
Glucose-Capillary: 366 mg/dL — ABNORMAL HIGH (ref 70–99)
Glucose-Capillary: 229 mg/dL — ABNORMAL HIGH (ref 70–99)

## 2021-02-07 LAB — BETA-HYDROXYBUTYRIC ACID: Beta-Hydroxybutyric Acid: 6.5 mmol/L — ABNORMAL HIGH (ref 0.05–0.27)

## 2021-02-07 LAB — CBC
HCT: 42.7 % (ref 36.0–46.0)
Hemoglobin: 14.4 g/dL (ref 12.0–15.0)
MCH: 32.9 pg (ref 26.0–34.0)
MCHC: 33.7 g/dL (ref 30.0–36.0)
MCV: 97.5 fL (ref 80.0–100.0)
Platelets: 348 10*3/uL (ref 150–400)
RBC: 4.38 MIL/uL (ref 3.87–5.11)
RDW: 13.6 % (ref 11.5–15.5)
WBC: 12.5 10*3/uL — ABNORMAL HIGH (ref 4.0–10.5)
nRBC: 0 % (ref 0.0–0.2)

## 2021-02-07 LAB — TROPONIN I (HIGH SENSITIVITY): Troponin I (High Sensitivity): 11 ng/L (ref <18)

## 2021-02-07 MED ORDER — INSULIN ASPART 100 UNIT/ML IJ SOLN
5.0000 [IU] | Freq: Once | INTRAMUSCULAR | Status: AC
  Administered 2021-02-07: 5 [IU] via INTRAVENOUS
  Filled 2021-02-07: qty 1

## 2021-02-07 MED ORDER — ONDANSETRON HCL 4 MG/2ML IJ SOLN
4.0000 mg | Freq: Once | INTRAMUSCULAR | Status: AC
  Administered 2021-02-07: 4 mg via INTRAVENOUS
  Filled 2021-02-07: qty 2

## 2021-02-07 MED ORDER — INSULIN ASPART 100 UNIT/ML IJ SOLN: 10.0000 [IU] | Freq: Once | INTRAMUSCULAR | Status: DC

## 2021-02-07 MED ORDER — SODIUM CHLORIDE 0.9 % IV BOLUS
1000.0000 mL | Freq: Once | INTRAVENOUS | Status: AC
  Administered 2021-02-07: 1000 mL via INTRAVENOUS

## 2021-02-07 NOTE — ED Notes (Signed)
Labs sent.  Pt awake  iv fluids infused.  Family with pt.

## 2021-02-07 NOTE — ED Notes (Signed)
Fsbs 229

## 2021-02-07 NOTE — Progress Notes (Signed)
Inpatient Diabetes Program Recommendations  AACE/ADA: New Consensus Statement on Inpatient Glycemic Control (2015)  Target Ranges:  Prepandial:   less than 140 mg/dL      Peak postprandial:   less than 180 mg/dL (1-2 hours)      Critically ill patients:  140 - 180 mg/dL   Lab Results  Component Value Date   GLUCAP 354 (H) 02/07/2021   HGBA1C 10.6 (H) 05/05/2020    Review of Glycemic Control  Diabetes history: DM1 ( makes no insulin) Outpatient Diabetes medications: Insulin pump  Inpatient Diabetes Program Recommendations:   Patient currently in ED. Spoke with patient and daughter @ bedside. Patient responds but has eyes closed and doesn't feel like talking much @ this time. Daughter states patient's insulin pump site was changed x 3 over the past 2-3 days due to elevated CBGs. Daughter and son have been in contact with Dr. Honor Junes (endocrinologist). Patient has appointment with Dr. Honor Junes in am. Reviewed with daughter when restarting insulin pump, will need to use a new site. Sites are limited due to patient unable to use both sides of her body due to curvature.  Will follow during hospitalization.  Insulin pump: She is currently on a Medtronic 630G pump:  Basal rates  Midnight = 0.1  4 AM = 0.4 6 AM = 0.5 1 PM = 0.95 6 PM = 0.725 10 PM = 0.5 TDD basal: 13.35 units  Bolus settings I/C: 16 ISF: 60 Target Glucose: MN = 130, 6 AM = 120-130, and 10 PM = 130  Active insulin time: 4 hours   If patient is admitted, Levemir 5 units bid, Novolog very sensitive q 4 hours  Thank you, Bethena Roys E. Laverna Dossett, RN, MSN, CDE  Diabetes Coordinator Inpatient Glycemic Control Team Team Pager [phone] (8am-5pm) 02/07/2021 1:44 PM

## 2021-02-07 NOTE — ED Provider Notes (Signed)
[facility] Provider Note    Event Date/Time   First MD Initiated Contact with Patient 02/07/21 1207     (approximate)   History   Hyperglycemia   HPI  Colleen Payne is a 71 y.o. female with a history of type 1 diabetes, DKA, A. fib, and remaining history as listed in EMR presents to the emergency department for treatment and evaluation of hyperglycemia.  Per her daughter, she has had glucose of over 600 for the past 3 days.  She has had 3 episodes of vomiting.  She has had very little appetite decreased oral intake.  Patient states that she just feels very sleepy.  Daughter states that this typically happens when her glucose is elevated.  Patient denies falls.  She denies pain.  Son gave 40 units of regular insulin this morning.     Physical Exam   Triage Vital Signs: ED Triage Vitals  Enc Vitals Group     BP 02/07/21 1100 (!) 127/52     Pulse Rate 02/07/21 1100 70     Resp 02/07/21 1100 18     Temp 02/07/21 1100 98 F (36.7 C)     Temp src --      SpO2 02/07/21 1100 100 %     Weight --      Height --      Head Circumference --      Peak Flow --      Pain Score 02/07/21 1100 0     Pain Loc --      Pain Edu? --      Excl. in [place]? --     Most recent vital signs: Vitals:   02/07/21 1445 02/07/21 1500  BP:  (!) 120/43  Pulse: (!) 59 (!) 59  Resp: 13 11  Temp:    SpO2: 98% 97%     General: Awake, no distress.  Somnolent. CV:  Good peripheral perfusion.  Resp:  Normal effort.  Breath sounds clear to auscultation Abd:  No distention.  Other:    ED Results / Procedures / Treatments   Labs (all labs ordered are listed, but only abnormal results are displayed) Labs Reviewed  BASIC METABOLIC PANEL - Abnormal; Notable for the following components:      Result Value   Sodium 129 (*)    Potassium 3.4 (*)    Chloride 90 (*)    CO2 20 (*)    Glucose, Bld 437 (*)    BUN 50 (*)    Creatinine, Ser 1.39 (*)    GFR, Estimated 41 (*)     Anion gap 19 (*)    All other components within normal limits  CBC - Abnormal; Notable for the following components:   WBC 12.5 (*)    All other components within normal limits  BETA-HYDROXYBUTYRIC ACID - Abnormal; Notable for the following components:   Beta-Hydroxybutyric Acid 6.50 (*)    All other components within normal limits  BASIC METABOLIC PANEL - Abnormal; Notable for the following components:   Sodium 134 (*)    Potassium 3.3 (*)    Glucose, Bld 267 (*)    BUN 45 (*)    Creatinine, Ser 1.10 (*)    Calcium 8.5 (*)    GFR, Estimated 54 (*)    All other components within normal limits  CBG MONITORING, ED - Abnormal; Notable for the following components:   Glucose-Capillary 450 (*)    All other components within normal limits  CBG  MONITORING, ED - Abnormal; Notable for the following components:   Glucose-Capillary 366 (*)    All other components within normal limits  CBG MONITORING, ED - Abnormal; Notable for the following components:   Glucose-Capillary 354 (*)    All other components within normal limits  CBG MONITORING, ED - Abnormal; Notable for the following components:   Glucose-Capillary 229 (*)    All other components within normal limits  URINALYSIS, ROUTINE W REFLEX MICROSCOPIC  TROPONIN I (HIGH SENSITIVITY)     EKG  ED ECG REPORT I, Karlynn Furrow, FNP-BC personally viewed and interpreted this ECG.   Date: 02/07/2021  EKG Time: 1231  Rate: 67  Rhythm: normal EKG, normal sinus rhythm, unchanged from previous tracings  Axis: normal  Intervals:none  ST&T Change: no change    RADIOLOGY Chest x-ray and radiology report reviewed by me.  No acute cardiopulmonary abnormality.   PROCEDURES:  Critical Care performed: No  Procedures   MEDICATIONS ORDERED IN ED: Medications  sodium chloride 0.9 % bolus 1,000 mL (0 mLs Intravenous Stopped 02/07/21 1358)  sodium chloride 0.9 % bolus 1,000 mL (0 mLs Intravenous Stopped 02/07/21 1527)  insulin aspart  (novoLOG) injection 5 Units (5 Units Intravenous Given 02/07/21 1358)  ondansetron (ZOFRAN) injection 4 mg (4 mg Intravenous Given 02/07/21 1358)     IMPRESSION / MDM / ASSESSMENT AND PLAN / ED COURSE  I reviewed the triage vital signs and the nursing notes.                              Differential diagnosis includes, but is not limited to, DKA, hyperglycemia,   71 year old female with a history of type 1 diabetes presents to the emergency department with her daughter after her glucose has been elevated over 600 for the past few days.  See HPI for further details.  IV fluids infusing.  Cardiac monitor showing sinus rhythm.  On exam, the patient is quite somnolent but will wake up to voice and follows commands appropriately.  Exam is otherwise reassuring.  Patient's personal Dexcom is now reading 362.  Patient and daughter would like to see if we can get the glucose under control and not be admitted.  She does have an appointment with her endocrinologist tomorrow and an appointment with primary care next week.  Plan will be to give 2 L of fluids and 10 units of insulin then repeat BMP.  Initial BMP shows a glucose of 437 with a relative sodium of 129.  BUN and creatinine are elevated at 50 and 1.39 with an estimated GFR 41.  After 2 L of fluids and 6 units of insulin, glucose is down to 267 with an improved GFR 54.  Patient does have a history of stage II renal failure.   Patient and daughter felt comfortable being discharged.  Prior to her leaving, she was able to tolerate food and fluids without feeling nauseated.  She was also able to get up with some assistance ambulate to the toilet.  Strict ER return precautions were discussed.  She was encouraged to keep her scheduled appointments.      FINAL CLINICAL IMPRESSION(S) / ED DIAGNOSES   Final diagnoses:  Diabetic ketoacidosis without coma associated with type 1 diabetes mellitus ([place])     Rx / DC Orders   ED Discharge Orders      None        Note:  This document was prepared using Dragon  voice recognition software and may include unintentional dictation errors.   Victorino Dike, FNP 02/07/21 Erskine Emery, MD 02/08/21 2350

## 2021-02-07 NOTE — ED Provider Notes (Addendum)
Procedures     ----------------------------------------- 3:24 PM on 02/07/2021 -----------------------------------------  Patient nontoxic.  Vital signs essentially normal.  She has an appointment with her endocrinologist in the morning.  After IV insulin bolus and completing 2 L IV fluids, plan to repeat basic metabolic panel to ensure improvement of the anion gap/acidosis.  Patient and family were offered admission, but they strongly prefer to continue managing outpatient and they feel comfortable doing so with their extensive knowledge and managing diabetes, her insulin pump, and complications that they have encountered in the past.  Return precautions discussed.  If metabolic panel is improving, I think is reasonable for patient to be discharged to follow-up outpatient tomorrow.    Carrie Mew, MD 02/07/21 [place], MD 02/13/21 1524

## 2021-02-07 NOTE — ED Triage Notes (Signed)
Pt comes into the ED via [facility] from home c/o hyperglycemia.  Pt told EMS she had 4 days of CBG over 600.  CBG 372 for EMS today.  Pt is on a insulin pump and was given insulin this morning by family.  Pt is lethargic for EMS.  532ml of IVF given. Pt has decreased oral intake, but denies any N/V/D.   98% RA 35 ETCO2 136/50

## 2021-02-07 NOTE — ED Triage Notes (Signed)
Pt comes via EMS with c/o hyperglycemia. Pt mumbling and not making much sense. Pt states her sugars have been high. Pt sleeping in triage.   Pt states she thinks her sugar was 700.

## 2021-02-07 NOTE — Discharge Instructions (Signed)
Please increase your fluid intake.  Follow up with endocrinology tomorrow as scheduled and primary care this week.  Return to the ER for symptoms that change, worsen, or for new concerns.

## 2021-02-07 NOTE — ED Notes (Signed)
Pt here with hyperglycemia. Pt still has insulin pump attached to her abd. Pt c/o fatigue.

## 2021-02-07 NOTE — ED Provider Notes (Incomplete Revision)
Procedures     ----------------------------------------- 3:24 PM on 02/07/2021 -----------------------------------------  Patient nontoxic.  Vital signs essentially normal.  She has an appointment with her endocrinologist in the morning.  After IV insulin bolus and completing 2 L IV fluids, plan to repeat basic metabolic panel to ensure improvement of the anion gap/acidosis.  Patient and family were offered admission, but they strongly prefer to continue managing outpatient and they feel comfortable doing so with their extensive knowledge and managing diabetes, her insulin pump, and complications that they have encountered in the past.  Return precautions discussed.  If metabolic panel is improving, I think is reasonable for patient to be discharged to follow-up outpatient tomorrow.    Carrie Mew, MD 02/07/21 1525

## 2021-02-07 NOTE — ED Notes (Signed)
Pt up to bathroom with assistance   Family with pt.   

## 2021-09-05 ENCOUNTER — Emergency Department: Payer: Medicare Other

## 2021-09-05 ENCOUNTER — Emergency Department
Admission: EM | Admit: 2021-09-05 | Discharge: 2021-09-05 | Disposition: A | Discharge status: Home / Self Care | Payer: Medicare Other | Attending: Emergency Medicine | Admitting: Emergency Medicine

## 2021-09-05 ENCOUNTER — Other Ambulatory Visit: Payer: Self-pay

## 2021-09-05 DIAGNOSIS — R42 Dizziness and giddiness: Secondary | ICD-10-CM | POA: Insufficient documentation

## 2021-09-05 DIAGNOSIS — E876 Hypokalemia: Secondary | ICD-10-CM | POA: Diagnosis not present

## 2021-09-05 DIAGNOSIS — R519 Headache, unspecified: Secondary | ICD-10-CM | POA: Insufficient documentation

## 2021-09-05 DIAGNOSIS — W19XXXA Unspecified fall, initial encounter: Secondary | ICD-10-CM | POA: Insufficient documentation

## 2021-09-05 LAB — CBC WITH DIFFERENTIAL/PLATELET
Abs Immature Granulocytes: 0.02 K/uL (ref 0.00–0.07)
Basophils Absolute: 0.1 K/uL (ref 0.0–0.1)
Basophils Relative: 1 %
Eosinophils Absolute: 0.2 K/uL (ref 0.0–0.5)
Eosinophils Relative: 2 %
HCT: 43.7 % (ref 36.0–46.0)
Hemoglobin: 15 g/dL (ref 12.0–15.0)
Immature Granulocytes: 0 %
Lymphocytes Relative: 26 %
Lymphs Abs: 2 K/uL (ref 0.7–4.0)
MCH: 33.1 pg (ref 26.0–34.0)
MCHC: 34.3 g/dL (ref 30.0–36.0)
MCV: 96.5 fL (ref 80.0–100.0)
Monocytes Absolute: 0.6 K/uL (ref 0.1–1.0)
Monocytes Relative: 8 %
Neutro Abs: 4.9 K/uL (ref 1.7–7.7)
Neutrophils Relative %: 63 %
Platelets: 250 K/uL (ref 150–400)
RBC: 4.53 MIL/uL (ref 3.87–5.11)
RDW: 12.8 % (ref 11.5–15.5)
WBC: 7.8 K/uL (ref 4.0–10.5)
nRBC: 0 % (ref 0.0–0.2)

## 2021-09-05 LAB — URINALYSIS, COMPLETE (UACMP) WITH MICROSCOPIC
Bilirubin Urine: NEGATIVE
Glucose, UA: 50 mg/dL — AB
Hgb urine dipstick: NEGATIVE
Ketones, ur: NEGATIVE mg/dL
Leukocytes,Ua: NEGATIVE
Nitrite: NEGATIVE
Protein, ur: NEGATIVE mg/dL
Specific Gravity, Urine: 1.017 (ref 1.005–1.030)
pH: 6 (ref 5.0–8.0)

## 2021-09-05 LAB — BASIC METABOLIC PANEL WITH GFR
Anion gap: 9 (ref 5–15)
BUN: 24 mg/dL — ABNORMAL HIGH (ref 8–23)
CO2: 27 mmol/L (ref 22–32)
Calcium: 10 mg/dL (ref 8.9–10.3)
Chloride: 100 mmol/L (ref 98–111)
Creatinine, Ser: 0.66 mg/dL (ref 0.44–1.00)
GFR, Estimated: >60 mL/min (ref >60)
Glucose, Bld: 189 mg/dL — ABNORMAL HIGH (ref 70–99)
Potassium: 3.1 mmol/L — ABNORMAL LOW (ref 3.5–5.1)
Sodium: 136 mmol/L (ref 135–145)

## 2021-09-05 LAB — TROPONIN I (HIGH SENSITIVITY)
Troponin I (High Sensitivity): 6 ng/L (ref <18)
Troponin I (High Sensitivity): 6 ng/L (ref <18)

## 2021-09-05 MED ORDER — POTASSIUM CHLORIDE CRYS ER 20 MEQ PO TBCR
40.0000 meq | EXTENDED_RELEASE_TABLET | Freq: Once | ORAL | Status: AC
  Administered 2021-09-05: 40 meq via ORAL
  Filled 2021-09-05: qty 2

## 2021-09-05 NOTE — Discharge Instructions (Signed)
Please seek medical attention for any high fevers, chest pain, shortness of breath, change in behavior, persistent vomiting, bloody stool or any other new or concerning symptoms.  

## 2021-09-05 NOTE — ED Notes (Signed)
E-signature pad unavailable - Pt verbalized understanding of D/C information - no additional concerns at this time.  

## 2021-09-05 NOTE — ED Notes (Signed)
Pt to MRI

## 2021-09-05 NOTE — ED Triage Notes (Addendum)
Pt presents to ED with c/o of having a fall today as well dizziness, pt states the dizziness is what made her fall. Pt was ambulatory to the  EMS stretcher with her cane. Pt does complain of intermittent dizziness, pt denies nausea at this time,. Pt denies hitting her head, pt does take eliquis.   EMS states HX of dementia. Pt is oriented to person and place, pt not sure of year or current president. Pt able to follow commands. NAD noted. Pt hypertensive, pt does have HX of same per EMS.

## 2021-09-05 NOTE — ED Notes (Signed)
Pt back from MRI 

## 2021-09-05 NOTE — ED Provider Notes (Signed)
[facility] Provider Note    Event Date/Time   First MD Initiated Contact with Patient 09/05/21 1641     (approximate)   History   Dizziness and Fall   HPI  Colleen Payne is a 71 y.o. female who presents to the emergency department today because of concerns for dizziness and falls.  Patient was walking into her kitchen when she became dizzy.  She then fell to the ground.  She denies hitting or hurting any part of her body when she fell.  She denies any chest pain or palpitations during the fall.  Did have some headache.  Family who is ENT stated they checked her blood pressure shortly after the fall and it was high.  Patient has not had a similar episode in the past.     Physical Exam   Triage Vital Signs: ED Triage Vitals  Enc Vitals Group     BP 09/05/21 1644 (!) 182/77     Pulse Rate 09/05/21 1644 (!) 56     Resp 09/05/21 1644 15     Temp 09/05/21 1644 97.9 F (36.6 C)     Temp Source 09/05/21 1644 Oral     SpO2 09/05/21 1644 99 %     Weight --      Height --      Head Circumference --      Peak Flow --      Pain Score 09/05/21 1645 3     Pain Loc --      Pain Edu? --      Excl. in [place]? --     Most recent vital signs: Vitals:   09/05/21 1644  BP: (!) 182/77  Pulse: (!) 56  Resp: 15  Temp: 97.9 F (36.6 C)  SpO2: 99%    General: Awake, alert, oriented. CV:  Good peripheral perfusion. Regular rate and rhythm. Resp:  Normal effort. Lungs clear. Abd:  No distention. Non tender. Other:  PERRL, EOMI. Face symmetric. Tongue midline. Strength 5/5 in upper and lower extremities. Sensation grossly intact.   ED Results / Procedures / Treatments   Labs (all labs ordered are listed, but only abnormal results are displayed) Labs Reviewed  BASIC METABOLIC PANEL - Abnormal; Notable for the following components:      Result Value   Potassium 3.1 (*)    Glucose, Bld 189 (*)    BUN 24 (*)    All other components within normal limits   URINALYSIS, COMPLETE (UACMP) WITH MICROSCOPIC - Abnormal; Notable for the following components:   Color, Urine YELLOW (*)    APPearance CLEAR (*)    Glucose, UA 50 (*)    Bacteria, UA RARE (*)    All other components within normal limits  CBC WITH DIFFERENTIAL/PLATELET  TROPONIN I (HIGH SENSITIVITY)  TROPONIN I (HIGH SENSITIVITY)     EKG  I, [name], attending physician, personally viewed and interpreted this EKG  EKG Time: 1641 Rate: 57 Rhythm: sinus bradycardia Axis: normal Intervals: qtc 428 QRS: narrow, q waves v1 ST changes: no st elevation Impression: abnormal ekg   RADIOLOGY I independently interpreted and visualized the CT head. My interpretation: No bleed. Radiology interpretation:  IMPRESSION:  1. No acute intracranial findings.  2. No change from prior.  3. 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   MR brain IMPRESSION:  1. No acute intracranial abnormality.  2. Findings of chronic small vessel ischemia and volume loss.     PROCEDURES:  Critical Care performed: No  Procedures   MEDICATIONS ORDERED IN ED: Medications - No data to display   IMPRESSION / MDM / [place] / ED COURSE  I reviewed the triage vital signs and the nursing notes.                              Differential diagnosis includes, but is not limited to, anemia, electrolyte abnormality, ACS, stroke.  Patient's presentation is most consistent with acute presentation with potential threat to life or bodily function.  Patient presented to the emergency department today because of concerns for episode of dizziness and fall.  Time my exam patient is awake alert.  No focal neurodeficits on exam.  Broad work-up was initiated here in the emergency department.  Blood work without any concerning anemia.  Patient did have slight hypokalemia.  She was given potassium here in the emergency department.  CT head without any acute bleed.  I did get an MRI  of the brain to evaluate for possible CVA.  This was negative.  While here in the emergency department patient was able to ambulate without any further episodes of dizziness.  At this point given reassuring work-up I think is reasonable for patient be discharged home.  Discussed and encourage patient to follow-up with primary care and return for any worsening symptoms.   FINAL CLINICAL IMPRESSION(S) / ED DIAGNOSES   Final diagnoses:  Dizziness  Hypokalemia     Note:  This document was prepared using Dragon voice recognition software and may include unintentional dictation errors.    [name], MD 09/05/21 2201

## 2022-03-28 ENCOUNTER — Other Ambulatory Visit: Payer: Self-pay | Admitting: Unknown Physician Specialty

## 2022-03-28 DIAGNOSIS — L720 Epidermal cyst: Secondary | ICD-10-CM
